# Patient Record
Sex: Male | Born: 1945
Health system: Southern US, Community
[De-identification: ages and names within clinical notes are randomized; demographics above are authoritative.]

## PROBLEM LIST (undated history)

## (undated) DIAGNOSIS — I10 Essential (primary) hypertension: Secondary | ICD-10-CM

## (undated) DIAGNOSIS — I493 Ventricular premature depolarization: Secondary | ICD-10-CM

## (undated) DIAGNOSIS — K439 Ventral hernia without obstruction or gangrene: Secondary | ICD-10-CM

## (undated) DIAGNOSIS — I442 Atrioventricular block, complete: Secondary | ICD-10-CM

## (undated) DIAGNOSIS — C109 Malignant neoplasm of oropharynx, unspecified: Secondary | ICD-10-CM

## (undated) DIAGNOSIS — E278 Other specified disorders of adrenal gland: Secondary | ICD-10-CM

## (undated) DIAGNOSIS — J189 Pneumonia, unspecified organism: Secondary | ICD-10-CM

## (undated) DIAGNOSIS — I350 Nonrheumatic aortic (valve) stenosis: Secondary | ICD-10-CM

## (undated) DIAGNOSIS — K8020 Calculus of gallbladder without cholecystitis without obstruction: Secondary | ICD-10-CM

## (undated) DIAGNOSIS — K469 Unspecified abdominal hernia without obstruction or gangrene: Secondary | ICD-10-CM

## (undated) DIAGNOSIS — I272 Pulmonary hypertension, unspecified: Secondary | ICD-10-CM

## (undated) DIAGNOSIS — G4733 Obstructive sleep apnea (adult) (pediatric): Secondary | ICD-10-CM

## (undated) DIAGNOSIS — I472 Ventricular tachycardia: Secondary | ICD-10-CM

## (undated) DIAGNOSIS — I319 Disease of pericardium, unspecified: Secondary | ICD-10-CM

## (undated) DIAGNOSIS — R911 Solitary pulmonary nodule: Secondary | ICD-10-CM

## (undated) DIAGNOSIS — I219 Acute myocardial infarction, unspecified: Secondary | ICD-10-CM

## (undated) DIAGNOSIS — I712 Thoracic aortic aneurysm, without rupture: Secondary | ICD-10-CM

## (undated) DIAGNOSIS — R011 Cardiac murmur, unspecified: Secondary | ICD-10-CM

## (undated) DIAGNOSIS — Z136 Encounter for screening for cardiovascular disorders: Secondary | ICD-10-CM

## (undated) HISTORY — DX: Aneurysm of the ascending aorta, without rupture: I71.21

## (undated) HISTORY — DX: Atherosclerotic heart disease of native coronary artery without angina pectoris: I25.10

## (undated) HISTORY — DX: Nonrheumatic aortic (valve) stenosis: I35.0

## (undated) HISTORY — DX: Atrioventricular block, complete: I44.2

## (undated) HISTORY — DX: Ventricular tachycardia: I47.2

## (undated) HISTORY — DX: Obstructive sleep apnea (adult) (pediatric): G47.33

## (undated) HISTORY — DX: Ventricular premature depolarization: I49.3

## (undated) HISTORY — DX: Hyperlipidemia, unspecified: E78.5

## (undated) HISTORY — DX: Other specified disorders of adrenal gland: E27.8

## (undated) HISTORY — DX: Morbid (severe) obesity due to excess calories: E66.01

## (undated) HISTORY — DX: Thoracic aortic aneurysm, without rupture: I71.2

## (undated) HISTORY — DX: Solitary pulmonary nodule: R91.1

## (undated) HISTORY — DX: Encounter for screening for cardiovascular disorders: Z13.6

## (undated) HISTORY — DX: Other ventricular tachycardia: I47.29

## (undated) HISTORY — DX: Ventral hernia without obstruction or gangrene: K43.9

## (undated) HISTORY — DX: Presence of cardiac pacemaker: Z95.0

## (undated) HISTORY — DX: Malignant neoplasm of oropharynx, unspecified: C10.9

## (undated) HISTORY — DX: Disease of pericardium, unspecified: I31.9

## (undated) HISTORY — DX: Unspecified abdominal hernia without obstruction or gangrene: K46.9

## (undated) HISTORY — DX: Pulmonary hypertension, unspecified: I27.20

---
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
## 1999-08-01 ENCOUNTER — Encounter: Payer: Self-pay | Admitting: Emergency Medicine

## 1999-08-01 ENCOUNTER — Emergency Department (HOSPITAL_COMMUNITY): Admission: EM | Admit: 1999-08-01 | Discharge: 1999-08-02 | Payer: Self-pay | Admitting: Emergency Medicine

## 2002-05-09 HISTORY — PX: HERNIA REPAIR: SHX51

## 2002-09-07 HISTORY — PX: KNEE ARTHROSCOPY: SUR90

## 2004-08-02 ENCOUNTER — Ambulatory Visit (HOSPITAL_COMMUNITY): Admission: RE | Admit: 2004-08-02 | Discharge: 2004-08-02 | Payer: Self-pay | Admitting: Family Medicine

## 2005-05-13 ENCOUNTER — Ambulatory Visit (HOSPITAL_BASED_OUTPATIENT_CLINIC_OR_DEPARTMENT_OTHER): Admission: RE | Admit: 2005-05-13 | Discharge: 2005-05-13 | Payer: Self-pay | Admitting: Family Medicine

## 2005-05-22 ENCOUNTER — Ambulatory Visit: Payer: Self-pay | Admitting: Internal Medicine

## 2008-05-09 DIAGNOSIS — Z136 Encounter for screening for cardiovascular disorders: Secondary | ICD-10-CM

## 2008-05-09 DIAGNOSIS — E278 Other specified disorders of adrenal gland: Secondary | ICD-10-CM

## 2008-05-09 HISTORY — DX: Encounter for screening for cardiovascular disorders: Z13.6

## 2008-05-09 HISTORY — DX: Other specified disorders of adrenal gland: E27.8

## 2009-01-28 ENCOUNTER — Encounter: Admission: RE | Admit: 2009-01-28 | Discharge: 2009-01-28 | Payer: Self-pay | Admitting: Internal Medicine

## 2009-03-06 ENCOUNTER — Encounter: Admission: RE | Admit: 2009-03-06 | Discharge: 2009-03-06 | Payer: Self-pay | Admitting: General Surgery

## 2009-03-09 HISTORY — PX: VENTRAL HERNIA REPAIR: SHX424

## 2010-06-04 ENCOUNTER — Inpatient Hospital Stay (HOSPITAL_COMMUNITY)
Admission: EM | Admit: 2010-06-04 | Discharge: 2010-06-06 | Payer: Self-pay | Source: Home / Self Care | Attending: Internal Medicine | Admitting: Internal Medicine

## 2010-06-04 LAB — POCT I-STAT, CHEM 8
BUN: 22 mg/dL (ref 6–23)
Calcium, Ion: 1.11 mmol/L — ABNORMAL LOW (ref 1.12–1.32)
Chloride: 103 mEq/L (ref 96–112)
Creatinine, Ser: 1 mg/dL (ref 0.4–1.5)
Glucose, Bld: 119 mg/dL — ABNORMAL HIGH (ref 70–99)
HCT: 47 % (ref 39.0–52.0)
Hemoglobin: 16 g/dL (ref 13.0–17.0)
Potassium: 4.3 mEq/L (ref 3.5–5.1)
Sodium: 137 mEq/L (ref 135–145)
TCO2: 28 mmol/L (ref 0–100)

## 2010-06-04 LAB — CBC
Hemoglobin: 15.1 g/dL (ref 13.0–17.0)
MCHC: 34.6 g/dL (ref 30.0–36.0)
Platelets: 234 10*3/uL (ref 150–400)
RDW: 12.5 % (ref 11.5–15.5)

## 2010-06-04 LAB — DIFFERENTIAL
Basophils Absolute: 0 10*3/uL (ref 0.0–0.1)
Basophils Relative: 0 % (ref 0–1)
Eosinophils Absolute: 0.1 10*3/uL (ref 0.0–0.7)
Monocytes Absolute: 1.6 10*3/uL — ABNORMAL HIGH (ref 0.1–1.0)
Neutro Abs: 12.5 10*3/uL — ABNORMAL HIGH (ref 1.7–7.7)

## 2010-06-04 LAB — D-DIMER, QUANTITATIVE: D-Dimer, Quant: 2.36 ug/mL-FEU — ABNORMAL HIGH (ref 0.00–0.48)

## 2010-06-04 LAB — POCT CARDIAC MARKERS
CKMB, poc: 2.9 ng/mL (ref 1.0–8.0)
Myoglobin, poc: 119 ng/mL (ref 12–200)
Troponin i, poc: <0.05 ng/mL (ref 0.00–0.09)

## 2010-06-05 LAB — CK TOTAL AND CKMB (NOT AT ARMC)
CK, MB: 2.4 ng/mL (ref 0.3–4.0)
CK, MB: 1.9 ng/mL (ref 0.3–4.0)
CK, MB: 2 ng/mL (ref 0.3–4.0)
Relative Index: INVALID (ref 0.0–2.5)
Relative Index: INVALID (ref 0.0–2.5)
Relative Index: 1.9 (ref 0.0–2.5)
Total CK: 99 U/L (ref 7–232)
Total CK: 89 U/L (ref 7–232)
Total CK: 108 U/L (ref 7–232)

## 2010-06-05 LAB — HEMOGLOBIN A1C
Hgb A1c MFr Bld: 5.7 % — ABNORMAL HIGH (ref <5.7)
Mean Plasma Glucose: 117 mg/dL — ABNORMAL HIGH (ref <117)

## 2010-06-05 LAB — TROPONIN I
Troponin I: 0.02 ng/mL (ref 0.00–0.06)
Troponin I: 0.01 ng/mL (ref 0.00–0.06)

## 2010-06-05 LAB — COMPREHENSIVE METABOLIC PANEL
ALT: 28 U/L (ref 0–53)
AST: 20 U/L (ref 0–37)
Alkaline Phosphatase: 50 U/L (ref 39–117)
CO2: 28 mEq/L (ref 19–32)
Calcium: 8.7 mg/dL (ref 8.4–10.5)
GFR calc Af Amer: >60 mL/min (ref >60)
Glucose, Bld: 114 mg/dL — ABNORMAL HIGH (ref 70–99)
Potassium: 4.1 mEq/L (ref 3.5–5.1)
Sodium: 139 mEq/L (ref 135–145)
Total Protein: 6.5 g/dL (ref 6.0–8.3)

## 2010-06-05 LAB — PROTIME-INR
INR: 1.23 (ref 0.00–1.49)
Prothrombin Time: 15.7 seconds — ABNORMAL HIGH (ref 11.6–15.2)

## 2010-06-05 LAB — LIPID PANEL
Cholesterol: 138 mg/dL (ref 0–200)
LDL Cholesterol: 85 mg/dL (ref 0–99)
Triglycerides: 51 mg/dL (ref <150)
VLDL: 10 mg/dL (ref 0–40)

## 2010-06-05 LAB — DIFFERENTIAL
Basophils Absolute: 0 10*3/uL (ref 0.0–0.1)
Basophils Relative: 0 % (ref 0–1)
Eosinophils Relative: 0 % (ref 0–5)
Monocytes Absolute: 2.1 10*3/uL — ABNORMAL HIGH (ref 0.1–1.0)
Monocytes Relative: 13 % — ABNORMAL HIGH (ref 3–12)

## 2010-06-05 LAB — CBC
MCH: 31.4 pg (ref 26.0–34.0)
MCHC: 33.8 g/dL (ref 30.0–36.0)
RDW: 12.7 % (ref 11.5–15.5)

## 2010-06-05 LAB — PHOSPHORUS: Phosphorus: 3 mg/dL (ref 2.3–4.6)

## 2010-06-05 LAB — RHEUMATOID FACTOR: Rheumatoid fact SerPl-aCnc: 21 IU/mL — ABNORMAL HIGH (ref <=14)

## 2010-06-05 LAB — TSH: TSH: 0.966 u[IU]/mL (ref 0.350–4.500)

## 2010-06-05 LAB — SEDIMENTATION RATE: Sed Rate: 50 mm/hr — ABNORMAL HIGH (ref 0–16)

## 2010-06-05 LAB — MAGNESIUM: Magnesium: 2.2 mg/dL (ref 1.5–2.5)

## 2010-06-05 NOTE — H&P (Signed)
NAME:  Clarence Andrade, Clarence Andrade NO.:  [id number]  MEDICAL RECORD NO.:  [id number]          PATIENT TYPE:  EMS  LOCATION:  ED                           FACILITY:  [facility]  PHYSICIAN:  Michiel Cowboy, MDDATE OF BIRTH:  02-07-1946  DATE OF ADMISSION:  06/04/2010 DATE OF DISCHARGE:                             HISTORY & PHYSICAL   ATTENDING PHYSICIAN:  Michiel Cowboy, MD  PRIMARY CARE PROVIDER:  Theressa Millard, M.D.  CHIEF COMPLAINT:  Chest pain.  HISTORY:  The patient is a 65 year old gentleman with past medical history significant for 10 years ago he had pericarditis and osteoarthritis.  Otherwise, he is fairly healthy.  The patient 2 weeks ago started to have chest pain which was positional in nature and fairly significant.  He presented to Urgent Care and had EKG done with diagnosis of pericarditis.  He was asked to take ibuprofen around the clock which had showed some improvement.  He stopped ibuprofen therapy of about a week after the beginning of his symptoms because he started to feel much better.  For few days, he was asymptomatic and today he started to have the severe chest pain, which was again positional.  It was somewhat worse though and more diffuse and also somewhat pleuritic in nature, at which point he presented to the emergency department.  In the ED, he had a CT scan of the chest done to rule out PE, which showed moderate pericardial effusion, which point, cardiology was called. Dr. Donnie Aho wanted him to be admitted by medicine for observation and echogram in the morning, at which point [facility] hospitalist was called. The patient was supposed to have done an echo as an outpatient, but this was not arranged.  REVIEW OF SYSTEMS:  Significant for chest pain.  He has minimal shortness of breath when he tries to take deep breath because of pain, cough bothers him.  He has not had any recent URI type of symptoms, though no colds or upper respiratory  infections that he can mention.  No nausea, no vomiting, no diaphoresis.  The chest pain was pretty much continuous until he came into the emergency department and was relieved with Toradol and Dilaudid.  Otherwise review of systems is negative.  PAST MEDICAL HISTORY:  Significant for arthritis and history of pericarditis 10 years ago.  SOCIAL HISTORY:  The patient never smoked, does not drink or abuse drugs.  FAMILY HISTORY:  Significant for father with coronary artery disease at his 33s.  Mother with COPD.  Sister is healthy.  ALLERGIES:  None.  MEDICATIONS: 1. Aspirin 81 mg daily. 2. Ibuprofen 800 mg 2 times a day. 3. Aleve 1 tablet b.i.d.  PHYSICAL EXAMINATION:  VITAL SIGNS:  Temperature 98.5, blood pressure initially 128/83 and now 119/75, pulse 108, respirations 22, satting 93% on room air. GENERAL:  The patient appears to be in no acute distress, currently he is chest pain-free. HEAD:  Nontraumatic.  Moist mucous membranes. LUNGS:  Clear to auscultation except for very minimal crackles at the bases.HEART:  Regular rate and rhythm but somewhat distant heart sounds.  No rub appreciated. ABDOMEN:  Soft,  nontender, nondistended.  Somewhat obese. LOWER EXTREMITIES:  No clubbing, cyanosis, or edema. NEUROLOGICALLY:  Grossly intact. SKIN:  Clean, dry, and intact.  No rashes or breakdown present.  LABORATORY DATA:  White blood cell count 15.6, hemoglobin 16.  Sodium 137, potassium 4.3, creatinine 1.0.  BNP 62.9, D-dimer 2.36.  Cardiac enzymes unremarkable.  CT scan showing a moderate-size pericardial effusion and left pleural effusion.  ASSESSMENT AND PLAN:  This is a 65 year old gentleman with likely is pericarditis with some pericardial effusion noted on the CT scan, but he had not had an echogram yet to measure this more accurately.  We will admit.  Have discussed him with cardiology on call who will attempt to see him at night or the patient will be seen early in the  morning.  We will also facilitate obtaining an echogram done on weekend.  Per their recommendations, will start him on ibuprofen 600 mg t.i.d., give gentle fluids to help with preload.  Will cycle cardiac enzymes for prognostication reasons.  I also believe that overall, he may benefit from a further cardiac evaluation in the future. Also the cause of recurrent pericarditis should be evaluated.  I will order ANA and rheumatoid factor for now but probably further evaluation should be done.  Prophylaxis.  Make sure he is on Protonix.  Avoid heparin.  Will put him on SCDs.     Michiel Cowboy, MD     AVD/MEDQ  D:  06/04/2010  T:  06/04/2010  Job:  [id number]  cc:   Theressa Millard, M.D. Fax: [phone]  Electronically Signed by Therisa Doyne MD on 06/05/2010 06:25:50 AM

## 2010-06-06 LAB — CBC
Hemoglobin: 13.3 g/dL (ref 13.0–17.0)
MCH: 31.6 pg (ref 26.0–34.0)
Platelets: 215 10*3/uL (ref 150–400)
RBC: 4.21 MIL/uL — ABNORMAL LOW (ref 4.22–5.81)
WBC: 10.3 10*3/uL (ref 4.0–10.5)

## 2010-06-06 LAB — BASIC METABOLIC PANEL
BUN: 12 mg/dL (ref 6–23)
Calcium: 8.5 mg/dL (ref 8.4–10.5)
Creatinine, Ser: 0.71 mg/dL (ref 0.4–1.5)
GFR calc non Af Amer: >60 mL/min (ref >60)
Glucose, Bld: 106 mg/dL — ABNORMAL HIGH (ref 70–99)

## 2010-06-07 LAB — ANA: Anti Nuclear Antibody(ANA): NEGATIVE

## 2010-06-11 NOTE — H&P (Addendum)
NAME:  Clarence Andrade, Clarence Andrade NO.:  [id number]  MEDICAL RECORD NO.:  [id number]          PATIENT TYPE:  INP  LOCATION:  1441                         FACILITY:  [facility]  PHYSICIAN:  Theressa Millard, M.D.    DATE OF BIRTH:  1946-01-03  DATE OF ADMISSION:  06/04/2010 DATE OF DISCHARGE:                             HISTORY & PHYSICAL   HISTORY OF PRESENT ILLNESS:  This 65 year old male has previously been in good health.  About 10 years ago, he developed chest discomfort and was diagnosed with pericarditis and treated with nonsteroidal anti- inflammatory agents.  He does not remember much in the way of details with that.  Two weeks ago, he developed a positional chest discomfort, which was severe and presented to Urgent Care and was diagnosed with pericarditis.  He took ibuprofen and showed improvement, and discontinued it about a week after starting his symptoms.  He was able to exercise this week and the day of admission had more severe pain, which became severe pleuritic and he was unable to sleep lying down and became more diffuse pleuritic, and presented to the emergency department.  A CT scan of the chest was done to rule out pulmonary embolus, which showed a moderate pericardial effusion.  He was admitted last night for observation.  He has been treated with Toradol and Dilaudid since then, and continues to have pleuritic pain and he is was unable to lay down.  He is not currently having dyspnea.  An echocardiogram done earlier today was somewhat technically difficult due to his obesity, but showed  predominantly anterior fusion with questionable right ventricular free wall thickening versus exudate on the right ventricular wall.  His inferior vena cava also appeared to be dilated, but there was no definite evidence of tamponade.  He does not have significant anginal type pain.  He has been obese for many years, but has otherwise benign history.  PAST MEDICAL HISTORY:   Past history is remarkable for osteoarthritis mainly of the knees and significant obesity.  His lipid status is unknown.  There is no prior history of hypertension.  PAST SURGICAL HISTORY:  He has had a hernia repair x3.  ALLERGIES:  None.  MEDICATIONS:  Ibuprofen, aspirin, and Aleve.  FAMILY HISTORY:  Father had coronary artery disease in his 6s and died age 45.  Mother died of COPD.  Sister is healthy.  SOCIAL HISTORY:  He is a Engineer, civil (consulting).  He has 1 son.  He has never smoked and does not use alcohol to excess.  REVIEW OF SYSTEMS:  He has been obese for many years.  He has no eye, nose, or throat problems.  No skin problems.  He has no significant arthralgias.  He has not had weight loss or fever.  He did not have any recent upper respiratory infections or viral-like illnesses preceding this illness.  He denies other symptoms applicable to the review of systems.  PHYSICAL EXAMINATION:  GENERAL:  On examination, he has a large pleasant male, who is currently in no acute distress. VITAL SIGNS:  Blood pressure is currently 107/70, pulse is 87, it was 108  earlier.  There appeared to be a 15 mm pulses paradoxus noted. SKIN:  Warm and dry. ENT:  EOMI.  PERRLA. CENTRAL NERVOUS SYSTEM:  Clear.  It appear that his neck veins were elevated when he was sitting upright and there appeared to be elevated JVD at 45 degrees.  I could not tell whether there was a Kussmaul sign. There are decreased breath sounds in the left base. CARDIOVASCULAR:  Somewhat distant heart sounds.  There is no S3.  I could not appreciate a rub. ABDOMEN:  Soft and nontender.  There is 1+ edema noted.  He has noted some episodic edema, particularly when he traveled previously.  IMAGING STUDIES:  His EKG shows low voltage, but pulse is alternans. There is no PR depression or changes of pericarditis on EKG.  Chest x- ray showed density at the left base.  CT scan reviewed with radiologist showed an  irregular loculated effusion predominantly anterior and posterior, not much effusion noted at the apex.  LABORATORY DATA:  White count of 16,100, 79% neutrophils, 30% monocytes, Pro-time, INR is 15.7, D-dimer is 2.36.  Hemoglobin A1c is 5.7.  Serial cardiac enzymes were normal.  RA factor is 21.  TSH is 0.966.  IMPRESSION AND PLAN:  Pericardial effusion, which appears to be few so constrictive with clinical evidence of pericarditis.  He appears to have clinically some elevation of his right heart pressures.  I would suggest the addition of colchicine to his regimen.  Because the effusion is loculated if it increases, he will need to have pericardial window or biopsy done to drain the fluid and to determine what the etiology for the fluid is.  He does have a remote history of pericarditis and I would like to try to see old records from this.  At the present time, he appears to have some elevation of his right heart pressures.  This will need to be watched closely, but he does not appear to be in tamponade right now.  It is possible this has been a more subacute process that has been developing.  He continues to have an elevated white count and we will need to be sure he does not have bacterial pericarditis; although, he does not clinically have evidence for that.  Thank you for asking me to see him with you.     Georga Hacking, M.D.   ______________________________ Theressa Millard, M.D.    WST/MEDQ  D:  06/05/2010  T:  06/05/2010  Job:  [id number]  cc:   Theressa Millard, M.D.  Electronically Signed by Theressa Millard M.D. on 06/11/2010 09:41:46 PM Electronically Signed by Lacretia Nicks. Donnie Aho M.D. on 06/16/2010 09:25:43 AM

## 2010-06-30 NOTE — Discharge Summary (Addendum)
NAME:  Clarence Andrade, PATIL NO.:  [id number]  MEDICAL RECORD NO.:  [id number]          PATIENT TYPE:  INP  LOCATION:  1441                         FACILITY:  [facility]  PHYSICIAN:  Elby Showers, MD    DATE OF BIRTH:  1945/12/29  DATE OF ADMISSION:  06/04/2010 DATE OF DISCHARGE:  06/06/2010                              DISCHARGE SUMMARY   DISCHARGE DIAGNOSIS:  Pericarditis.  DISCHARGE MEDICATIONS: 1. Colchicine 0.6 mg 1 tablet b.i.d. 2. Ibuprofen 600 mg 1 tablet 3 times a day. 3. Aspirin 81 mg daily.  DISPOSITION:  The patient is discharged in stable condition.  He has some residual chest discomfort.  He will be following up with his primary care physician, Dr. Theressa Millard, the day after admission on June 07 2010.  He will follow up with Cardiology within 1 week.  CONSULTATIONS:  Cardiology, the patient was seen by Dr. Donnie Aho.  PROCEDURES: 1. Chest x-ray on June 04, 2010, shows vague opacity at the left     lung base, possible left effusion and probably left basilar     pneumonia. 2. CT angio of the chest on June 04, 2010, shows moderate-sized     pericardial effusion, no evidence of pulmonary emboli, coronary     artery calcifications are present, there is a left pleural effusion     and left basilar atelectasis. 3. A 2-D echo on June 05, 2010, read by Dr. Donnie Aho shows left     ventricle to be of normal size, moderate concentric hypertrophy,     systolic function normal, ejection fraction 50% to 60%, wall motion     is normal.  Left atrium is mildly dilated.  Right ventricle is of     normal size with mild increase in thickness.  There is a moderate     to large pericardial effusion which is circumferential.  Fluid     exhibits a fibrinous appearance consistent with an     effusive/constrictive pathology. 4. A 2-D echo on June 06, 2010, read by Dr. Donnie Aho, left ventricle     moderate concentric hypertrophy, systolic function is normal,  pericardial effusion persists, it is partially loculated.  There is     no evidence of hemodynamic compromise.  There is a left pleural     effusion.  HISTORY AND PHYSICAL:  This is a 65 year old gentleman with past medical history of pericarditis 11 years prior to presentation presented on June 04, 2010, with chest pain for 2 weeks.  Initially, chest pain was mild to moderate, becoming acutely severe before presentation. Chest pain was positional in nature, relieved by leaning forward.  He had been taking ibuprofen around the clock and had some improvement of symptoms.  Prior to presentation, he developed pleuritic pain, pain with deep breathing and decided to present to the emergency department at that time.  HOSPITAL COURSE:  Pericarditis.  On presentation to the emergency room, a CT angio of the chest was done which revealed a pericardial effusion. Cardiology was consulted, 2-D echo was scheduled.  Mr. Vivas received Toradol and Dilaudid in the emergency room which completely resolved his symptoms.  He did not have recurrence of symptoms during the hospitalization.  He described a shortness of breath, and chest tightness/heaviness.  He was maintained on ibuprofen while inpatient. He will be discharged on ibuprofen and colchicine.  Two echocardiograms were performed, both demonstrating the pericardial effusion.  There was no compromise of ventricular function.  He will be discharged with immediate follow up with his primary care provider, and he will follow up with Cardiology within the next week.  Pertinent labs including cardiac enzymes were negative x3.  Erythrocyte sedimentation rate was 50.  ANA is negative.  Rheumatoid factor is 21, which is slightly elevated.  TSH is normal.  Hemoglobin A1c is 5.7. Lipid profile shows total cholesterol of 138, LDL of 85, HDL of 43.  DISCHARGE LABORATORY DATA AND VITALS:  On day of discharge, temperature 97.8, pulse 78, blood pressure  115/75, respirations 18, oxygen saturation 96%.  Sodium 141, potassium 4.3, chloride 104, bicarb 30, BUN 12, creatinine 0.7, glucose of 106, calcium 85.  White blood cells 10.3, hemoglobin 13.3, hematocrit 39.4, platelets 215,000.     Elby Showers, MD     CW/MEDQ  D:  06/09/2010  T:  06/09/2010  Job:  [id number]  Electronically Signed by Elby Showers MD on 06/30/2010 09:58:59 AM

## 2010-09-07 ENCOUNTER — Other Ambulatory Visit: Payer: Self-pay | Admitting: Orthopedic Surgery

## 2010-09-07 ENCOUNTER — Encounter (HOSPITAL_COMMUNITY): Payer: Medicare Other | Attending: Orthopedic Surgery

## 2010-09-07 DIAGNOSIS — Z01811 Encounter for preprocedural respiratory examination: Secondary | ICD-10-CM | POA: Insufficient documentation

## 2010-09-07 DIAGNOSIS — M1710 Unilateral primary osteoarthritis, unspecified knee: Secondary | ICD-10-CM | POA: Insufficient documentation

## 2010-09-07 DIAGNOSIS — Z01812 Encounter for preprocedural laboratory examination: Secondary | ICD-10-CM | POA: Insufficient documentation

## 2010-09-07 DIAGNOSIS — Z79899 Other long term (current) drug therapy: Secondary | ICD-10-CM | POA: Insufficient documentation

## 2010-09-07 HISTORY — PX: TOTAL KNEE ARTHROPLASTY: SHX125

## 2010-09-07 LAB — PROTIME-INR
INR: 1.01 (ref 0.00–1.49)
Prothrombin Time: 13.5 seconds (ref 11.6–15.2)

## 2010-09-07 LAB — URINALYSIS, ROUTINE W REFLEX MICROSCOPIC
Glucose, UA: NEGATIVE mg/dL
Ketones, ur: 15 mg/dL — AB
Nitrite: NEGATIVE
Protein, ur: NEGATIVE mg/dL
pH: 5.5 (ref 5.0–8.0)

## 2010-09-07 LAB — CBC
MCH: 30.8 pg (ref 26.0–34.0)
MCHC: 34 g/dL (ref 30.0–36.0)
RDW: 13 % (ref 11.5–15.5)

## 2010-09-07 LAB — URINE MICROSCOPIC-ADD ON

## 2010-09-07 LAB — COMPREHENSIVE METABOLIC PANEL
ALT: 33 U/L (ref 0–53)
Alkaline Phosphatase: 73 U/L (ref 39–117)
BUN: 25 mg/dL — ABNORMAL HIGH (ref 6–23)
CO2: 29 mEq/L (ref 19–32)
Calcium: 9.6 mg/dL (ref 8.4–10.5)
GFR calc non Af Amer: >60 mL/min (ref >60)
Glucose, Bld: 93 mg/dL (ref 70–99)
Sodium: 143 mEq/L (ref 135–145)
Total Protein: 6.9 g/dL (ref 6.0–8.3)

## 2010-09-15 ENCOUNTER — Inpatient Hospital Stay (HOSPITAL_COMMUNITY)
Admission: RE | Admit: 2010-09-15 | Discharge: 2010-09-20 | DRG: 462 | Disposition: A | Discharge status: Skilled Nursing Facility | Payer: Medicare Other | Source: Ambulatory Visit | Attending: Orthopedic Surgery | Admitting: Orthopedic Surgery

## 2010-09-15 DIAGNOSIS — Z01812 Encounter for preprocedural laboratory examination: Secondary | ICD-10-CM

## 2010-09-15 DIAGNOSIS — E871 Hypo-osmolality and hyponatremia: Secondary | ICD-10-CM | POA: Diagnosis not present

## 2010-09-15 DIAGNOSIS — I9589 Other hypotension: Secondary | ICD-10-CM | POA: Diagnosis not present

## 2010-09-15 DIAGNOSIS — R112 Nausea with vomiting, unspecified: Secondary | ICD-10-CM | POA: Diagnosis not present

## 2010-09-15 DIAGNOSIS — T40605A Adverse effect of unspecified narcotics, initial encounter: Secondary | ICD-10-CM | POA: Diagnosis not present

## 2010-09-15 DIAGNOSIS — D62 Acute posthemorrhagic anemia: Secondary | ICD-10-CM | POA: Diagnosis not present

## 2010-09-15 DIAGNOSIS — M1710 Unilateral primary osteoarthritis, unspecified knee: Principal | ICD-10-CM | POA: Diagnosis present

## 2010-09-15 LAB — ABO/RH: ABO/RH(D): O POS

## 2010-09-15 LAB — TYPE AND SCREEN
ABO/RH(D): O POS
Antibody Screen: NEGATIVE

## 2010-09-16 LAB — BASIC METABOLIC PANEL
CO2: 30 mEq/L (ref 19–32)
Calcium: 8 mg/dL — ABNORMAL LOW (ref 8.4–10.5)
Creatinine, Ser: 0.79 mg/dL (ref 0.4–1.5)
GFR calc Af Amer: >60 mL/min (ref >60)
GFR calc non Af Amer: >60 mL/min (ref >60)
Sodium: 137 mEq/L (ref 135–145)

## 2010-09-16 LAB — PROTIME-INR
INR: 1.18 (ref 0.00–1.49)
Prothrombin Time: 15.2 seconds (ref 11.6–15.2)

## 2010-09-16 LAB — CBC
Platelets: 136 10*3/uL — ABNORMAL LOW (ref 150–400)
RBC: 3.82 MIL/uL — ABNORMAL LOW (ref 4.22–5.81)
RDW: 13.1 % (ref 11.5–15.5)
WBC: 9.8 10*3/uL (ref 4.0–10.5)

## 2010-09-17 LAB — CBC
MCH: 31.4 pg (ref 26.0–34.0)
Platelets: 116 10*3/uL — ABNORMAL LOW (ref 150–400)
RBC: 3.53 MIL/uL — ABNORMAL LOW (ref 4.22–5.81)
WBC: 12.4 10*3/uL — ABNORMAL HIGH (ref 4.0–10.5)

## 2010-09-17 LAB — PROTIME-INR
INR: 1.43 (ref 0.00–1.49)
Prothrombin Time: 17.6 seconds — ABNORMAL HIGH (ref 11.6–15.2)

## 2010-09-17 LAB — BASIC METABOLIC PANEL
Calcium: 8.4 mg/dL (ref 8.4–10.5)
GFR calc non Af Amer: >60 mL/min (ref >60)
Glucose, Bld: 129 mg/dL — ABNORMAL HIGH (ref 70–99)
Sodium: 133 mEq/L — ABNORMAL LOW (ref 135–145)

## 2010-09-18 LAB — BASIC METABOLIC PANEL
CO2: 30 mEq/L (ref 19–32)
Calcium: 8.3 mg/dL — ABNORMAL LOW (ref 8.4–10.5)
GFR calc Af Amer: >60 mL/min (ref >60)
GFR calc non Af Amer: >60 mL/min (ref >60)
Sodium: 133 mEq/L — ABNORMAL LOW (ref 135–145)

## 2010-09-18 LAB — CBC
Hemoglobin: 10.4 g/dL — ABNORMAL LOW (ref 13.0–17.0)
MCHC: 33.5 g/dL (ref 30.0–36.0)
RDW: 13.1 % (ref 11.5–15.5)
WBC: 11.8 10*3/uL — ABNORMAL HIGH (ref 4.0–10.5)

## 2010-09-18 LAB — PROTIME-INR: INR: 1.67 — ABNORMAL HIGH (ref 0.00–1.49)

## 2010-09-19 LAB — CBC
Platelets: 154 10*3/uL (ref 150–400)
RDW: 13.3 % (ref 11.5–15.5)
WBC: 9.8 10*3/uL (ref 4.0–10.5)

## 2010-09-19 LAB — PROTIME-INR: INR: 1.89 — ABNORMAL HIGH (ref 0.00–1.49)

## 2010-09-20 LAB — PROTIME-INR: Prothrombin Time: 24.7 seconds — ABNORMAL HIGH (ref 11.6–15.2)

## 2010-09-22 NOTE — Op Note (Signed)
NAME:  Clarence Andrade, REWERTS               ACCOUNT NO.:  [id number]  MEDICAL RECORD NO.:  [id number]           PATIENT TYPE:  I  LOCATION:  1610                         FACILITY:  [facility]  PHYSICIAN:  Ollen Gross, M.D.    DATE OF BIRTH:  1945/10/14  DATE OF PROCEDURE:  09/15/2010 DATE OF DISCHARGE:                              OPERATIVE REPORT   PREOPERATIVE DIAGNOSIS:  Osteoarthritis, bilateral knees.  POSTOPERATIVE DIAGNOSIS:  Osteoarthritis, bilateral knees.  PROCEDURE:  Bilateral total knee arthroplasty.  SURGEON:  Ollen Gross, M.D.  ASSISTANT:  Alexzandrew L. Perkins, P.A.C.  ANESTHESIA:  Epidural with general.  ESTIMATED BLOOD LOSS:  Minimal.  DRAIN:  Autovac x1 on each side.  TOURNIQUET TIME:  Right, 38 minutes at 300 mmHg; left, 48 minutes at 300 mmHg.  COMPLICATIONS:  None.  CONDITION.:  Stable to recovery room.  BRIEF CLINICAL NOTE:  Clarence Andrade is a 65 year old male who has advanced end-stage arthritis of both knees with progressively worsening pain and dysfunction in each knee.  Pain is essentially equal between the 2 sides.  He has failed nonoperative management.  We discussed doing bilateral total knees in the same setting versus doing one at a time. He said that he was very much in favor of doing both at the same time. We discussed procedure risks, potential complications associated with both and he elects today to proceed with bilateral total knee arthroplasty.  PROCEDURE IN DETAIL:  After administration of epidural anesthetic catheter and then successful administration of general anesthetic, tourniquets were then placed high on the patient's thighs and both lower extremities were prepped and draped in the usual sterile fashion.  We did the right side first.  The right lower extremity was wrapped in Esmarch, knee flexed, tourniquet inflated to 300 mmHg.  Midline incision was made with #10 blade through subcutaneous tissue to the level of the extensor  mechanism.  A fresh blade was used make a medial parapatellar arthrotomy.  Soft tissue on the proximal medial tibia subperiosteally elevated to the joint line with the knife and into the semimembranosus bursa with a Cobb elevator.  Soft tissue laterally was elevated with attention being paid to avoiding the patellar tendon on the tibial tubercle.  Patella was everted, knee flexed 90 degrees, and ACL and PCL were removed.  Drill was used create a starting hole in the distal femur and the canal was thoroughly irrigated.  A 5-degree right valgus alignment guide was placed.  Distal femoral cutting block was pinned to remove 11 mm off the distal femur.  Resection was made with an oscillating saw.  Sizing guide was placed, size 5 was the most appropriate.  Rotation was marked at the epicondylar axis.  Size 5 cutting block was placed and anterior-posterior chamfer cuts made.  The tibia subluxed forward and the menisci removed.  Extramedullary tibial alignment guide was placed referencing proximally at the medial aspect of the tibial tubercle and distally along the second metatarsal axis and tibial crest.  The block was pinned to remove 2 mm off the more deficient medial side.  Tibial resection was made with an oscillating saw.  Size  4 was the most appropriate tibial component.  The proximal tibia was prepared with a modular drill and keel punch for the size 4. Femoral preparation completed with the intercondylar cut.  Size 4 mobile bearing tibial trial, size 5 posterior stabilized femoral trial, and a 10-mm posterior stabilized rotating platform insert trial placed.  There was little play with the 10, so went for 12.5 which allowed for full extension with excellent varus-valgus and anterior- posterior balance throughout full range of motion.  The patella was everted and thickness measured to be 23 mm.  Freehand resection was taken to 13 mm, 38 template was placed, lug holes were drilled,  trial patella was placed and it tracked normally.  Osteophytes were removed off the posterior femur with the trials in place.  All trials were removed and the cut bone surfaces were prepared with pulsatile lavage. Cement was mixed and once ready for implantation, the size 4 mobile bearing tibial tray, size five 5 posterior stabilized femur and 38 patella were cemented in place and the patella was held with a clamp. Trial 12.5-mm insert was placed, knee held in full extension, and all extruded cement removed.  When the cement fully hardened, then the permanent 12.5-mm posterior stabilized rotating platform insert was placed in the tibial tray.  Wound was copiously irrigated with saline solution and then the arthrotomy closed over Hemovac drain with interrupted #1 PDS.  Flexion against gravity to 135 degrees and patella tracks normally.  Tourniquet was then released for total time of 38 minutes.  Subcu was closed with interrupted 2-0 Vicryl and subcuticular running 4-0 Monocryl.  The left side was then addressed.  Left lower extremity was wrapped in Esmarch, knee flexed, tourniquet inflated to 300 mmHg.  Midline incision was made with a #10 blade through subcutaneous tissue to the level the extensor mechanism.  Fresh blade was used make a medial parapatellar arthrotomy.  Soft tissue releases were performed and then the ACL and PCL were removed.  Drill was used to create a starting hole in the distal femur and the canal was thoroughly irrigated.  The 5-degree left valgus alignment guide was placed and the distal femoral cutting block was pinned to remove 11 mm off the distal femur.  Distal femoral resection was made with an oscillating saw.  Sizing guide was placed, size 5 was the most appropriate.  Rotation was marked off the epicondylar axis.  Size 5 cutting block was placed and the anterior- posterior chamfer cuts made.  The tibia subluxed forward and menisci removed.  The  extramedullary tibial alignment guide was placed referencing proximally at the medial aspect of tibial tubercle and distally along the second metatarsal axis and tibial crest.  The block was pinned to remove 2 mm off the more deficient medial side.  Deficiency was a little worse on this side than the other side, we took little more  bone.  Tibial resection was made with an oscillating saw.  Size 4 was the most appropriate tibial component and the proximal tibia was prepared the modular drill and keel punch for the size 4.  Femoral preparation was completed with the intercondylar cut for the size 5.  Trial size 4 mobile bearing tibial tray, size 5 posterior stabilized femur, and 12.5 mm posterior stabilized rotating platform insert trials was placed.  There was little play with that, so went to 15 which allowed for full extension with excellent varus-valgus and anterior- posterior balance throughout full range of motion.  Patella was everted and  thickness measured to be 23 mm.  Freehand resection taken to 13 mm, 38 template was placed, lug holes were drilled, trial patella was placed and it tracks normally.  Osteophytes were removed off the posterior femur with the trial in place.  All trials were removed and the cut bone surfaces prepared with pulsatile lavage.  Cement was mixed and once ready for implantation, size 4 mobile bearing tibial tray, size 5 posterior stabilized femur and 38 patella were cemented in place and the patella was held with a clamp.  Trial 15-mm insert was placed, knee held in full extension, all extruded cement removed.  When the cement fully hardened, then the permanent 15-mm posterior stabilized rotating platform insert was placed in the tibial tray.  Wound was copiously irrigated with saline solution and then the arthrotomy closed over Autovac drain with interrupted #1 PDS.  Flexion against gravity was to 135 degrees.  Patella tracks normally.  Tourniquet was  released, second tourniquet time was 48 minutes.  Subcu was closed with interrupted 2-0 Vicryl, subcuticular running 4-0 Monocryl.  Incision was then cleaned and dried and Steri-Strips and bulky sterile dressing were applied. Left lower extremity was then placed into a knee immobilizer and he was awakened and transported to recovery in stable condition.     Ollen Gross, M.D.     FA/MEDQ  D:  09/15/2010  T:  09/15/2010  Job:  [id number]  Electronically Signed by Ollen Gross M.D. on 09/22/2010 10:13:21 AM

## 2010-09-24 NOTE — Procedures (Signed)
NAME:  Clarence Andrade, Clarence Andrade               ACCOUNT NO.:  [id number]   MEDICAL RECORD NO.:  [id number]          PATIENT TYPE:  OUT   LOCATION:  SLEEP CENTER                 FACILITY:  [facility]   PHYSICIAN:  Clinton D. Maple Hudson, M.D. DATE OF BIRTH:  [birth year]   DATE OF STUDY:  05/13/2005                              NOCTURNAL POLYSOMNOGRAM   REFERRING PHYSICIAN:  Dr. Tally Joe.   DATE OF STUDY:  May 13, 2005.   INDICATION FOR STUDY:  Hypersomnia with sleep apnea.   EPWORTH SLEEPINESS SCORE:  8/24.   BMI:  38.4.   WEIGHT:  300 pounds.   SLEEP ARCHITECTURE:  Short total sleep time 266 minutes with sleep  efficiency 62%. Stage I was 14%, stage II 83%, stages III and IV 1%, REM 2%  of total sleep time. Sleep latency 72 minutes, REM latency 197 minutes,  awake after sleep onset 95 minutes, arousal index increased at 55.6. No  bedtime medication taken.   RESPIRATORY DATA:  Split study protocol:  Apnea/hypopnea index (AHI, RDI)  66.5 obstructive events per hour indicating severe obstructive sleep  apnea/hypopnea syndrome. This included 98 obstructive apneas and 46  hypopneas before C-PAP. Events were significantly present in all sleep  positions but somewhat more common while supine. REM AHI was absent. C-PAP  was titrated to 12 CWP, AHI 2.2 per hour. A large ResMed full-face ultra  mirage mask was used with heated humidifier. He was very intolerant to the C-  PAP saying that it was uncomfortable and that he could not wear it at home.   OXYGEN DATA:  Very loud snoring audible through the closed door with oxygen  desaturation to a nadir of 50%. After C-PAP control, saturation held 91-96%  on room air.   CARDIAC DATA:  Sinus rhythm with occasional PVC.   MOVEMENT/PARASOMNIA:  A total of 131 limb jerks were reported of which 23  were associated with arousal or awakening for periodic limb movement with  arousal index of 5.2 per hour which is increased.   IMPRESSION/RECOMMENDATIONS:  1.   Severe obstructive sleep apnea/hypopnea syndrome, AHI 66.5 per hour.      Very loud snoring and oxygen desaturation to 50%. Events were somewhat      more common supine. The patient reports that he cannot usually sleep      supine due to back injury.  2.  Successful C-PAP titration to 12 CWP, AHI 2.2 per hour with      normalization of oxygen and prevention of snoring. A large ResMed full-      face ultra mirage mask was used with heated humidifier, thought patient      commented that it was poorly tolerated and interfered with sleep. He may      benefit from desensitization training which can be arranged through the      sleep center manager, Daphine Deutscher by contacting [phone]. A      supplemental sleep medication may help. Adequate time to explore      alternative masks styles may also help. Otherwise, he will need to      consider alternative therapies including surgery and oral appliances  while working on weight loss.  3.  Periodic limb movement with arousal, 5.2 per hour. This may be increased      relative to his usual pattern at home due to the stimulation of      monitoring and C-PAP.      Clinton D. Maple Hudson, M.D.  Diplomate, Biomedical engineer of Sleep Medicine  Electronically Signed     CDY/MEDQ  D:  05/22/2005 11:25:38  T:  05/22/2005 22:37:30  Job:  [id number]

## 2010-09-29 NOTE — Discharge Summary (Signed)
NAME:  Clarence Andrade, Clarence Andrade               ACCOUNT NO.:  [id number]  MEDICAL RECORD NO.:  [id number]           PATIENT TYPE:  I  LOCATION:  1610                         FACILITY:  [facility]  PHYSICIAN:  Ollen Gross, M.D.    DATE OF BIRTH:  12-14-1945  DATE OF ADMISSION:  09/15/2010 DATE OF DISCHARGE:  09/20/2010                        DISCHARGE SUMMARY - REFERRING   ADMITTING DIAGNOSES: 1. Osteoarthritis bilateral knees. 2. Past episodes of pericarditis times two. 3. Childhood illnesses of mumps.  DISCHARGE DIAGNOSES: 1. Osteoarthritis bilateral knee status post bilateral total-knee     replacement arthroplasties. 2. Mild postoperative hyponatremia. 3. Postoperative acute blood loss anemia that did not require     transfusion. 4. Past episodes of pericarditis times two. 5. Childhood illnesses of mumps.  PROCEDURE:  Sep 15, 2010, bilateral total-knee replacement arthroplasties.  SURGEON:  Ollen Gross, M.D.  ASSISTANT:  Alexzandrew L. Perkins, P.A.C.  ANESTHESIA:  General anesthetic with an epidural placement for postoperative pain control.  TOURNIQUET TIME:  Tourniquet time on the right knee was 38 minutes. Tourniquet time on the left knee was 48 minutes.  CONSULTATIONS:  Anesthesia for postoperative epidural management.  BRIEF HISTORY:  Clarence Andrade is a 65 year old male with advanced arthritis of bilateral knees, progressive worsening pain and dysfunction.  Pain is essentially equal with both sides.  He has failed nonoperative management and now presents to have bilateral total-knee replacement arthroplasties.  The risks and benefits were discussed of having one versus two done and he elected to proceed with surgery.  LABORATORY DATA:  CBC on admission:  Hemoglobin is 16.2, hematocrit of 47.7, white cell count 6.5, platelets 179.  PT/INR 13.5 and 1.01 with a PTT of 27.  Chemistry panel on admission all within normal limits. Preoperative UA showed some small leukocytes, a few  squamous, a few bacteria, 3-6 white cells, blood group type O positive.  Nasal swabs were negative for Staphylococcus aureus and negative for MRSA.  Serial CBCs were followed.  Hemoglobin dropped down to 11.7 and then to 10.4. The last noted H and H was 10.2 and 29.8.  Serial prothrombin times were followed per Coumadin protocol.  Last noted PT/INR was 24.7 and 2.21. Serial BMETs were followed for 3 days.  Sodium dropped from 143 to 133, which stabilized at 133.  Remaining electrolytes remained within normal limits.  Glucose went up from 93 to 143 and back down to 127.  RADIOLOGIC: 1. X-rays two-view chest on June 04, 2010.  A vague opacity in the     left lung base may represent left effusion and possible left     basilar pneumonia.  That was done in January. 2. He had a CT scan done also in January 2012.  Moderate-sized     pericardial effusion.  No evidence of acute pulmonary emboli.  Left     pleural effusion and left basilar atelectasis.  HOSPITAL COURSE:  The patient was admitted to [facility], taken to the operating room, and underwent the above-stated procedure without complication.  The patient tolerated the procedure well and was later transferred to the recovery room on the orthopedic floor. Anesthesia Department  was consulted postoperatively for epidural management.  He had a very decent night, although he did develop a little bit of nausea and vomiting.  We felt it was due to the PCA morphine so we discontinued that and switched him over to PCA Dilaudid. Encouraged p.o. medications.  He had decent output.  His pressure was a little soft, but felt to be due to medications and also the epidural, but he was asymptomatic with this.  Hemoglobin was 11.7.  He started transferring to a chair.  He originally wanted to go home, but over the first couple of days he was very slow and only taking one or two steps in the room and it was felt that he would benefit from  undergoing placement into a skilled facility.  The patient was in agreement so we got the social worker involved.  He wanted to look into [facility]. By day #2, the epidural was removed per Anesthesia.  His sodium was a little low, down to 133, but it actually stabilized there.  His hemoglobin remained stable at around 11, felt to be more of a dilutional component anyway.  He was positive on his fluids, so we gave him a little Lasix to help diurese.  He was started on Coumadin the night of day #1 and the epidural was removed on the morning of day #2.  Once the epidural had been out for 6 hours, he was started on Lovenox for bridging and remained on Lovenox until Monday.  He stayed in the hospital through the weekend and slowly progressing with physical therapy.  He was washed out with all of the narcotics and eventually stopped taking the p.o. pain medicine and only used Tylenol for the last day or two and felt much better.  He progressed through the weekend.  He was seen back on rounds on Monday morning on Sep 20, 2010, by Dr. Lequita Halt.  His INR was therapeutic at 2.21.  It was noted that a bed should be available today.  He was feeling okay and it was decided that he was to be transferred over to [facility] at that time.  DISCHARGE PLAN:  The patient will be transferred over to [facility].  DISCHARGE DIAGNOSES:  Please see above.  DISCHARGE MEDICATIONS:  Current medications at the time of transfer include: 1. Coumadin protocol.  Please titrate the Coumadin level for a target     INR between 2.0 and 3.0.  He needs to be on Coumadin for 4 weeks. 2. Colace 100 mg p.o. b.i.d. 3. Robaxin 500 mg p.o. q.6-8 hours p.r.n. spasm. 4. Ultracet one to two tablets every 6 hours as needed for moderate     pain. 5. Tylenol 325 one or two every 4-6 hours as needed for mild pain,     temperature or headache. 6. Laxative of choice. 7. Enema of choice. 8. Restoril 15 mg one to two tablets at  the hour of sleep p.r.n.     insomnia. 9. Benadryl 25 mg p.o. q.6 hours p.r.n. itching. 10.Artificial Tears both eyes p.r.n.  DIET:  As tolerated.  ACTIVITY:  Weightbearing as tolerated to both lower extremities following bilateral knee replacements.  Continue gait training, ambulation, ADLs, range of motion and strengthening exercises per total- knee protocol.  Please note, he may start showering once he is transferred over to [facility], however, do not submerge the incisions under water.  Daily dressing changes.  FOLLOWUP:  He needs to follow up with Dr. Lequita Halt next week  either on Tuesday, May 22 or Thursday, May 24.  Please contact the office at [phone] to help make an appointment for this patient and also help arrange transfer for followup care.  DISPOSITION:  [facility].  CONDITION ON DISCHARGE:  Improving.     Alexzandrew L. Julien Girt, P.A.C.   ______________________________ Ollen Gross, M.D.    ALP/MEDQ  D:  09/20/2010  T:  09/20/2010  Job:  [id number]  cc:   Theressa Millard, M.D. Fax: [phone]  Armanda Magic, M.D. Fax: [phone]  Electronically Signed by Patrica Duel P.A.C. on 09/23/2010 10:38:06 AM Electronically Signed by Ollen Gross M.D. on 09/29/2010 12:54:38 PM

## 2010-09-29 NOTE — H&P (Signed)
NAME:  Clarence Andrade, Clarence Andrade               ACCOUNT NO.:  [id number]  MEDICAL RECORD NO.:  [id number]           PATIENT TYPE:  I  LOCATION:  1610                         FACILITY:  [facility]  PHYSICIAN:  Ollen Gross, M.D.    DATE OF BIRTH:  06-13-1945  DATE OF ADMISSION:  09/15/2010 DATE OF DISCHARGE:                             HISTORY & PHYSICAL   CHIEF COMPLAINT:  Bilateral knee pain.  HISTORY OF PRESENT ILLNESS:  The patient is 65 year old male who has been seen by Dr. Lequita Halt for ongoing bilateral knee pain.  He has had problems with his knees for quite some time now, they are progressively getting worse, they are painful equally.  Started to interfere with his function.  He has been seen by Dr. Lequita Halt and found to have bone-on- bone arthritis in both knees especially in the medial patellofemoral compartment.  It is felt he would benefit from surgical intervention. Risks and benefits have been discussed.  He would like to proceed with surgery.  He has been seen preoperatively by Dr. Theressa Millard.  He had a past history of recent pericarditis and he has also been seen by Dr. Armanda Magic.  He has felt that he is stable for upcoming procedure by Dr. Earl Gala and Dr. Mayford Knife.  ALLERGIES:  No known drug allergies.  CURRENT MEDICATIONS:  Colchicine.  PAST MEDICAL HISTORY:  Past episodes of pericarditis x2.  Childhood illnesses of mumps.  PAST SURGICAL HISTORY:  Tonsillectomy in 1952, hernia surgery in 1978, hernia surgery again in January 2001, a third hernia surgery in November 2010, and left knee scope in 2004.  FAMILY HISTORY:  Father with heart attack.  Mother with COPD.  SOCIAL HISTORY:  Married, Environmental education officer, self employed.  Past smoker. One son.  His wife will be assisting with care after surgery.  He has 4 steps entering his home.  He does have a living will and a healthcare power of attorney.  REVIEW OF SYSTEMS:  GENERAL:  No fevers, chills or night sweats.  NEURO: No  seizures, syncope, or paralysis.  RESPIRATORY:  No shortness breath, productive cough or hemoptysis.  CARDIOVASCULAR:  No chest pain, no orthopnea.  GI:  No nausea, vomiting, diarrhea, constipation.  GU:  No dysuria, hematuria, discharge.  MUSCULOSKELETAL:  Bilateral knees.  PHYSICAL EXAMINATION:  VITAL SIGNS:  Pulse 76, respirations 12, blood pressure 130/84. GENERAL:  A 65 year old white male, well nourished, well developed, overweight, in no acute distress.  He is alert, oriented, cooperative, accompanied by his wife Clarence Andrade. HEENT:  Normocephalic, atraumatic.  Pupils round and reactive.  EOMs intact. NECK:  Supple. CHEST:  Clear anterior and posterior chest walls.  No rhonchi, rales or wheezing. HEART:  Regular rhythm.  No murmur.  S1, S2 noted. ABDOMEN:  Soft, nontender, round, protuberant abdomen.  Bowel sounds present. RECTAL:  Not done, not pertinent to present illness. BREASTS:  Not done, not pertinent to present illness. GENITALIA:  Not done, not pertinent to present illness. EXTREMITIES:  Left knee range of motion 5 to 120.  No instability.  No effusion.  Right knee shows range of motion 5 to 120.  No instability, no effusion.  IMPRESSION:  Bilateral knees osteoarthritis.  PLAN:  The patient admitted to [facility], undergo bilateral total knee replacement arthroplasty.  Surgery will be performed by Dr. Ollen Gross.     Alexzandrew L. Julien Girt, P.A.C.   ______________________________ Ollen Gross, M.D.    ALP/MEDQ  D:  09/16/2010  T:  09/16/2010  Job:  [id number]  cc:   Theressa Millard, M.D. Fax: [phone]  Armanda Magic, M.D. Fax: [phone]  Electronically Signed by Patrica Duel P.A.C. on 09/23/2010 10:37:54 AM Electronically Signed by Ollen Gross M.D. on 09/29/2010 12:54:35 PM

## 2013-06-12 ENCOUNTER — Encounter: Payer: Self-pay | Admitting: General Surgery

## 2013-06-12 DIAGNOSIS — I319 Disease of pericardium, unspecified: Secondary | ICD-10-CM | POA: Insufficient documentation

## 2013-06-25 ENCOUNTER — Ambulatory Visit: Payer: BLUE CROSS/BLUE SHIELD | Admitting: Cardiology

## 2013-07-03 ENCOUNTER — Ambulatory Visit (INDEPENDENT_AMBULATORY_CARE_PROVIDER_SITE_OTHER): Payer: Medicare Other | Admitting: Cardiology

## 2013-07-03 ENCOUNTER — Encounter: Payer: Self-pay | Admitting: Cardiology

## 2013-07-03 VITALS — BP 136/76 | HR 70 | Ht 73.0 in | Wt 284.0 lb

## 2013-07-03 DIAGNOSIS — R011 Cardiac murmur, unspecified: Secondary | ICD-10-CM | POA: Insufficient documentation

## 2013-07-03 DIAGNOSIS — I2510 Atherosclerotic heart disease of native coronary artery without angina pectoris: Secondary | ICD-10-CM

## 2013-07-03 DIAGNOSIS — I319 Disease of pericardium, unspecified: Secondary | ICD-10-CM

## 2013-07-03 NOTE — Patient Instructions (Addendum)
Your physician recommends that you continue on your current medications as directed. Please refer to the Current Medication list given to you today.  Your physician has requested that you have an exercise stress myoview. For further information please visit [email or web address]. Please follow instruction sheet, as given.  Your physician has requested that you have an echocardiogram. Echocardiography is a painless test that uses sound waves to create images of your heart. It provides your doctor with information about the size and shape of your heart and how well your heart's chambers and valves are working. This procedure takes approximately one hour. There are no restrictions for this procedure.  Your physician wants you to follow-up in: 1 Year with Dr Mallie Snooks will receive a reminder letter in the mail two months in advance. If you don't receive a letter, please call our office to schedule the follow-up appointment.

## 2013-07-03 NOTE — Progress Notes (Signed)
[facility] [address], [place], [place]  [address] Phone: [phone] Fax:  [phone]  Date:  07/03/2013   ID:  Clarence Andrade, DOB 1945-11-10, MRN [id number]  PCP:  Horton Finer, MD  Cardiologist:  Fransico Him, MD     History of Present Illness: Clarence Andrade is a 68 y.o. male with a history of Pericarditis remotely who presents today for yearly followup.  He is doing well.  He denies any chest pain, SOB, DOE, LE edema, dizziness, palpitations or syncope.  He says that last July he might have had a small bout of pericarditis.  He doubled up on his Colchicine for a few days and it resolved.     Wt Readings from Last 3 Encounters:  07/03/13 284 lb (128.822 kg)     Past Medical History  Diagnosis Date  . Pericarditis     now on colchicine  . OSA (obstructive sleep apnea)     Severe PSG 1/07 AHI 66/hr, O2 Nadir 50% Refuses treatment  . Abdominal hernia     Midline  . Adrenal incidentaloma 2010  . Screening for AAA (abdominal aortic aneurysm) 2010    CT Abd/Pelvis  . Ventral hernia     03/2009    Current Outpatient Prescriptions  Medication Sig Dispense Refill  . aspirin 81 MG tablet Take 81 mg by mouth daily.      . colchicine 0.6 MG tablet Take 0.6 mg by mouth daily.      . Multiple Vitamin (MULTIVITAMIN) tablet Take 1 tablet by mouth daily.      Marland Kitchen NAPROXEN PO Take 1 tablet by mouth daily.       No current facility-administered medications for this visit.    Allergies:    Allergies  Allergen Reactions  . Sertraline Hcl     Tremor, HA, Decreased Libido    Social History:  The patient  reports that he has quit smoking. He does not have any smokeless tobacco history on file. He reports that he does not drink alcohol or use illicit drugs.   Family History:  The patient's family history is not on file.   ROS:  Please see the history of present illness.      All other systems reviewed and negative.   PHYSICAL EXAM: VS:  BP 136/76  Pulse 70  Ht 6'  1" (1.854 m)  Wt 284 lb (128.822 kg)  BMI 37.48 kg/m2 Well nourished, well developed, in no acute distress HEENT: normal Neck: no JVD Cardiac:  normal S1, S2; RRR; 1/6 SM at RUSB Lungs:  clear to auscultation bilaterally, no wheezing, rhonchi or rales Abd: soft, nontender, no hepatomegaly Ext: no edema Skin: warm and dry Neuro:  CNs 2-12 intact, no focal abnormalities noted  EKG:  NSR with LAFB and nonspecific ST abnormality     ASSESSMENT AND PLAN:  1. Pericarditis - no reoccurrence recently  - continue colchicine 2. Coronary artery calcifications on chest CT with family history of ASCAD  - with family history of CAD and age will get a baseline Stress myoview  - continue ASA 3. Family history of aortic aneurysm with chest CT in 2012 with no evidence of aneurysm 4. Dyslipidemia - he is adamant that he is not going on lipid lowering meds.  His LDL goal should be less than 100 due to coronary artery calcifications on chest CT 5. Heart Murmur  - check 2D echo to evaluate  Followup with me in 1 year  Signed, Serge Main  Radford Pax, MD 07/03/2013 2:45 PM

## 2013-07-16 ENCOUNTER — Encounter (HOSPITAL_COMMUNITY): Payer: Medicare Other

## 2013-07-23 ENCOUNTER — Ambulatory Visit (HOSPITAL_BASED_OUTPATIENT_CLINIC_OR_DEPARTMENT_OTHER): Payer: Medicare Other | Admitting: Radiology

## 2013-07-23 ENCOUNTER — Encounter: Payer: Self-pay | Admitting: Cardiovascular Disease

## 2013-07-23 ENCOUNTER — Ambulatory Visit (HOSPITAL_COMMUNITY): Payer: Medicare Other | Attending: Cardiovascular Disease | Admitting: Radiology

## 2013-07-23 VITALS — BP 155/96 | HR 63 | Ht 73.0 in | Wt 284.0 lb

## 2013-07-23 DIAGNOSIS — I2510 Atherosclerotic heart disease of native coronary artery without angina pectoris: Secondary | ICD-10-CM

## 2013-07-23 DIAGNOSIS — R011 Cardiac murmur, unspecified: Secondary | ICD-10-CM

## 2013-07-23 DIAGNOSIS — R9431 Abnormal electrocardiogram [ECG] [EKG]: Secondary | ICD-10-CM

## 2013-07-23 MED ORDER — TECHNETIUM TC 99M SESTAMIBI GENERIC - CARDIOLITE
10.0000 | Freq: Once | INTRAVENOUS | Status: AC | PRN
  Administered 2013-07-23: 10 via INTRAVENOUS

## 2013-07-23 MED ORDER — TECHNETIUM TC 99M SESTAMIBI GENERIC - CARDIOLITE
30.0000 | Freq: Once | INTRAVENOUS | Status: AC | PRN
  Administered 2013-07-23: 30 via INTRAVENOUS

## 2013-07-23 NOTE — Progress Notes (Signed)
Echocardiogram performed.  

## 2013-07-23 NOTE — Progress Notes (Signed)
[facility] [address] [place], [place] [address] [phone]    Cardiology Nuclear Med Study  Clarence Andrade is a 68 y.o. male     MRN : [id number]     DOB: 1946-04-04  Procedure Date: 07/23/2013  Nuclear Med Background Indication for Stress Test:  Evaluation for Ischemia and Abnormal EKG History:  No known CAD, Echo 2012 EF 57%, Cardiac CT (+coronary calc.) 2012, hx pericarditis Cardiac Risk Factors: Family History - CAD, History of Smoking and Lipids  Symptoms:  None indicated   Nuclear Pre-Procedure Caffeine/Decaff Intake:  None > 12 hrs NPO After: 9:00pm   Lungs:  clear O2 Sat: 95% on room air. IV 0.9% NS with Angio Cath:  22g  IV Site: R Wrist, tolerated well IV Started by:  Irven Baltimore, RN  Chest Size (in):  56 Cup Size: n/a  Height: 6\' 1"  (1.854 m)  Weight:  284 lb (128.822 kg)  BMI:  Body mass index is 37.48 kg/(m^2). Tech Comments:  N/A    Nuclear Med Study 1 or 2 day study: 1 day  Stress Test Type:  Stress  Reading MD: N/A  Order Authorizing Provider:  Fransico Him, MD  Resting Radionuclide: Technetium 93m Sestamibi  Resting Radionuclide Dose: 11.0 mCi   Stress Radionuclide:  Technetium 69m Sestamibi  Stress Radionuclide Dose: 33.0 mCi           Stress Protocol Rest HR: 63 Stress HR: 148  Rest BP: 155/96 Stress BP: 214/107  Exercise Time (min): 6:00 METS: 7.0           Dose of Adenosine (mg):  n/a Dose of Lexiscan: n/a mg  Dose of Atropine (mg): n/a Dose of Dobutamine: n/a mcg/kg/min (at max HR)  Stress Test Technologist: Glade Lloyd, BS-ES  Nuclear Technologist:  Charlton Amor, CNMT     Rest Procedure:  Myocardial perfusion imaging was performed at rest 45 minutes following the intravenous administration of Technetium 39m Sestamibi. Rest ECG: Sinus rhythm, first degree AV block, septal MI.  Stress Procedure:  The patient exercised on the treadmill utilizing the Bruce Protocol for 6:00 minutes. The patient  stopped due to fatigue and denied any chest pain.  Technetium 80m Sestamibi was injected at peak exercise and myocardial perfusion imaging was performed after a brief delay. Stress ECG: No significant ST segment change suggestive of ischemia.  QPS Raw Data Images:  Acquisition technically good; LVE. Stress Images:  Normal homogeneous uptake in all areas of the myocardium. Rest Images:  Normal homogeneous uptake in all areas of the myocardium. Subtraction (SDS):  No evidence of ischemia. Transient Ischemic Dilatation (Normal <1.22):  0.90 Lung/Heart Ratio (Normal <0.45):  0.43  Quantitative Gated Spect Images QGS EDV:  146 ml QGS ESV:  63 ml  Impression Exercise Capacity:  Fair exercise capacity. BP Response:  Normal blood pressure response. Clinical Symptoms:  No chest pain or dyspnea. ECG Impression:  No significant ST segment change suggestive of ischemia. Comparison with Prior Nuclear Study: No images to compare  Overall Impression:  Normal stress nuclear study.  LV Ejection Fraction: 57%.  LV Wall Motion:  NL LV Function; NL Wall Motion  [name]

## 2014-04-29 ENCOUNTER — Encounter: Payer: Self-pay | Admitting: Cardiology

## 2014-05-13 ENCOUNTER — Encounter: Payer: Self-pay | Admitting: Cardiology

## 2014-05-14 ENCOUNTER — Encounter: Payer: Self-pay | Admitting: Cardiology

## 2015-06-04 DIAGNOSIS — K5730 Diverticulosis of large intestine without perforation or abscess without bleeding: Secondary | ICD-10-CM | POA: Diagnosis not present

## 2015-06-04 DIAGNOSIS — Z1211 Encounter for screening for malignant neoplasm of colon: Secondary | ICD-10-CM | POA: Diagnosis not present

## 2016-02-19 ENCOUNTER — Other Ambulatory Visit: Payer: Self-pay | Admitting: Geriatric Medicine

## 2016-02-19 DIAGNOSIS — D3502 Benign neoplasm of left adrenal gland: Secondary | ICD-10-CM | POA: Diagnosis not present

## 2016-02-19 DIAGNOSIS — Z1389 Encounter for screening for other disorder: Secondary | ICD-10-CM | POA: Diagnosis not present

## 2016-02-19 DIAGNOSIS — R011 Cardiac murmur, unspecified: Secondary | ICD-10-CM | POA: Diagnosis not present

## 2016-02-19 DIAGNOSIS — G4733 Obstructive sleep apnea (adult) (pediatric): Secondary | ICD-10-CM | POA: Diagnosis not present

## 2016-02-19 DIAGNOSIS — Z79899 Other long term (current) drug therapy: Secondary | ICD-10-CM | POA: Diagnosis not present

## 2016-02-19 DIAGNOSIS — E7800 Pure hypercholesterolemia, unspecified: Secondary | ICD-10-CM | POA: Diagnosis not present

## 2016-02-19 DIAGNOSIS — Z6841 Body Mass Index (BMI) 40.0 and over, adult: Secondary | ICD-10-CM | POA: Diagnosis not present

## 2016-02-19 DIAGNOSIS — Z0000 Encounter for general adult medical examination without abnormal findings: Secondary | ICD-10-CM | POA: Diagnosis not present

## 2016-03-01 ENCOUNTER — Other Ambulatory Visit: Payer: Self-pay

## 2016-03-07 ENCOUNTER — Other Ambulatory Visit: Payer: Self-pay

## 2016-03-07 ENCOUNTER — Ambulatory Visit (HOSPITAL_COMMUNITY): Payer: PPO | Attending: Cardiology

## 2016-03-07 DIAGNOSIS — R011 Cardiac murmur, unspecified: Secondary | ICD-10-CM | POA: Diagnosis not present

## 2016-04-13 ENCOUNTER — Ambulatory Visit (INDEPENDENT_AMBULATORY_CARE_PROVIDER_SITE_OTHER): Payer: PPO

## 2016-04-13 ENCOUNTER — Encounter: Payer: Self-pay | Admitting: Podiatry

## 2016-04-13 ENCOUNTER — Ambulatory Visit (INDEPENDENT_AMBULATORY_CARE_PROVIDER_SITE_OTHER): Payer: PPO | Admitting: Podiatry

## 2016-04-13 VITALS — BP 160/87 | HR 80 | Resp 18

## 2016-04-13 DIAGNOSIS — M722 Plantar fascial fibromatosis: Secondary | ICD-10-CM

## 2016-04-13 NOTE — Progress Notes (Signed)
   Subjective:    Patient ID: Clarence Andrade, male    DOB: 08-10-1945, 70 y.o.   MRN: [id number]  HPI   I have some right heel pain and it has been going on for about a month and the pain comes and goes and is sore and tender and hurts on the bottom and hurts after I have sit for a while and no discomfort first step  This patient presents today complaining of right inferior heel pain for approximately one month. The heel pain is most noticeable after standing and walking approximately 15 minutes and is relieved with rest and elevation. Patient has changed shoes and has tried an over-the-counter arch support with minimal reduction of pain. He denies any previous history or injury. He describes exercise at gym including treadmill which she says that is not exacerbate the heel pain.  Patient is a nonsmoker  Review of Systems  All other systems reviewed and are negative.      Objective:   Physical Exam  Orientated 3   Patient states he 6 feet and weighs approximately 310 pounds  Vascular: DP and PT pulses 2/4 bilaterally Capillary reflex immediate bilaterally  Neurological: Sensation to 10 g monofilament wire intact 3/5 right and 4/5 left Vibratory sensation nonreactive right reactive left Ankle reflex equal and reactive bilaterally  Dermatological: No open skin lesions bilaterally Texture and turgor within normal limits bilaterally  Musculoskeletal: Palpable tenderness medial plantar fascial insertional area right heel without any palpable lesions. This duplicates patient's discomfort. Fat pads are adequate bilaterally There is no restriction ankle, subtalar, midtarsal joints bilaterally Manual motor testing dorsi flexion, plantar flexion, inversion, eversion 5/5 bilaterally    Study Result    X-ray examination weightbearing right foot dated 04/13/2016  Intact bony structures without fracture and/or dislocation Mild midfoot degenerative changes Posterior and  inferior calcaneal spurs Metatarsus adductus  Radiographic impression: No acute bony abnormality noted in the weightbearing x-ray of the right foot dated 04/13/2016           Assessment & Plan:    Assessment: Mild peripheral neuropathy Plantar fasciitis right  Plan: I reviewed the results of the exam an x-ray with patient today in detail. We discussed treatment options including shoeing, stretching. Also discussed substituting treadmill with exercise bike Fasciitis strap dispensed to wear and right foot/ankle daily  Patient will return if symptoms do not 10 to improve in the next 4-6 weeks

## 2016-04-13 NOTE — Patient Instructions (Signed)
Stretch 1 Before changing from a seated to standing position toes toward your nose Stretch to With shoes on leaving against the wall keeping her knee straight and your feet flat on the floor Repeat stretches 30-60 seconds as needed Please wear sturdy athletic style shoe and your fasciitis strap If the heel pain is not reducing the next 4-6 weeks return for further evaluation  Plantar Fasciitis Plantar fasciitis is a painful foot condition that affects the heel. It occurs when the band of tissue that connects the toes to the heel bone (plantar fascia) becomes irritated. This can happen after exercising too much or doing other repetitive activities (overuse injury). The pain from plantar fasciitis can range from mild irritation to severe pain that makes it difficult for you to walk or move. The pain is usually worse in the morning or after you have been sitting or lying down for a while. CAUSES This condition may be caused by:  Standing for long periods of time.  Wearing shoes that do not fit.  Doing high-impact activities, including running, aerobics, and ballet.  Being overweight.  Having an abnormal way of walking (gait).  Having tight calf muscles.  Having high arches in your feet.  Starting a new athletic activity. SYMPTOMS The main symptom of this condition is heel pain. Other symptoms include:  Pain that gets worse after activity or exercise.  Pain that is worse in the morning or after resting.  Pain that goes away after you walk for a few minutes. DIAGNOSIS This condition may be diagnosed based on your signs and symptoms. Your health care provider will also do a physical exam to check for:  A tender area on the bottom of your foot.  A high arch in your foot.  Pain when you move your foot.  Difficulty moving your foot. You may also need to have imaging studies to confirm the diagnosis. These can include:  X-rays.  Ultrasound.  MRI. TREATMENT  Treatment for  plantar fasciitis depends on the severity of the condition. Your treatment may include:  Rest, ice, and over-the-counter pain medicines to manage your pain.  Exercises to stretch your calves and your plantar fascia.  A splint that holds your foot in a stretched, upward position while you sleep (night splint).  Physical therapy to relieve symptoms and prevent problems in the future.  Cortisone injections to relieve severe pain.  Extracorporeal shock wave therapy (ESWT) to stimulate damaged plantar fascia with electrical impulses. It is often used as a last resort before surgery.  Surgery, if other treatments have not worked after 12 months. HOME CARE INSTRUCTIONS  Take medicines only as directed by your health care provider.  Avoid activities that cause pain.  Roll the bottom of your foot over a bag of ice or a bottle of cold water. Do this for 20 minutes, 3-4 times a day.  Perform simple stretches as directed by your health care provider.  Try wearing athletic shoes with air-sole or gel-sole cushions or soft shoe inserts.  Wear a night splint while sleeping, if directed by your health care provider.  Keep all follow-up appointments with your health care provider. PREVENTION   Do not perform exercises or activities that cause heel pain.  Consider finding low-impact activities if you continue to have problems.  Lose weight if you need to. The best way to prevent plantar fasciitis is to avoid the activities that aggravate your plantar fascia. SEEK MEDICAL CARE IF:  Your symptoms do not go away after  treatment with home care measures.  Your pain gets worse.  Your pain affects your ability to move or do your daily activities. This information is not intended to replace advice given to you by your health care provider. Make sure you discuss any questions you have with your health care provider. Document Released: 01/18/2001 Document Revised: 08/17/2015 Document Reviewed:  03/05/2014 Elsevier Interactive Patient Education  2017 [employer].

## 2016-05-31 ENCOUNTER — Ambulatory Visit (INDEPENDENT_AMBULATORY_CARE_PROVIDER_SITE_OTHER): Payer: PPO | Admitting: Podiatry

## 2016-05-31 ENCOUNTER — Encounter: Payer: Self-pay | Admitting: Podiatry

## 2016-05-31 VITALS — BP 136/85 | HR 64 | Resp 18

## 2016-05-31 DIAGNOSIS — M722 Plantar fascial fibromatosis: Secondary | ICD-10-CM | POA: Diagnosis not present

## 2016-05-31 NOTE — Progress Notes (Signed)
Patient ID: Clarence Andrade, male   DOB: 1945/12/17, 71 y.o.   MRN: [id number]   Subjective: Patient presents again complaining of right inferior heel pain associated when he changed from seated to standing position prolonged standing and walking. He initially presented in December 2017 with a one-month history of this complaint. At that initial visit a fasciitis strap was dispensed and stretching prescribed. Patient has been compliant with these instructions, however, the right inferior heel pain has persisted without any improvement  Objective:  Vascular: DP and PT pulses 2/4 bilaterally Capillary reflex immediate bilaterally  Neurological: Sensation to 10 g monofilament wire intact 3/5 right and 4/5 left Vibratory sensation nonreactive right reactive left Ankle reflex equal and reactive bilaterally  Dermatological: No open skin lesions bilaterally Texture and turgor within normal limits bilaterally  Musculoskeletal: Palpable tenderness medial plantar fascial insertional area right heel without any palpable lesions. This duplicates patient's discomfort. Fat pads are adequate bilaterally There is no restriction ankle, subtalar, midtarsal joints bilaterally Manual motor testing dorsi flexion, plantar flexion, inversion, eversion 5/5 bilaterally  Assessment: Plantar fasciitis right  Plan: Discuss fasciitis in general. Recommended Kenalog injection and patient verbally consents  Skin is prepped with alcohol and Betadine and 10 mg of Kenalog mixed with 10 mg of plain Xylocaine and 2.5 mg of plain Sensorcaine were injected inferiorly he'll write for Kenalog injection #1. Patient tolerated procedure without any difficulty  Patient instructed to maintain stretching, plantar fasciitis strap and suggested over-the-counter soft arch support  Reappoint at patient's request

## 2016-05-31 NOTE — Patient Instructions (Signed)
Continue  wearing the fasciitis strap on the right foot Continue stretching Okay to use a soft over-the-counter arch support in conjunction with the fasciitis strap  Plantar Fasciitis Plantar fasciitis is a painful foot condition that affects the heel. It occurs when the band of tissue that connects the toes to the heel bone (plantar fascia) becomes irritated. This can happen after exercising too much or doing other repetitive activities (overuse injury). The pain from plantar fasciitis can range from mild irritation to severe pain that makes it difficult for you to walk or move. The pain is usually worse in the morning or after you have been sitting or lying down for a while. CAUSES This condition may be caused by:  Standing for long periods of time.  Wearing shoes that do not fit.  Doing high-impact activities, including running, aerobics, and ballet.  Being overweight.  Having an abnormal way of walking (gait).  Having tight calf muscles.  Having high arches in your feet.  Starting a new athletic activity. SYMPTOMS The main symptom of this condition is heel pain. Other symptoms include:  Pain that gets worse after activity or exercise.  Pain that is worse in the morning or after resting.  Pain that goes away after you walk for a few minutes. DIAGNOSIS This condition may be diagnosed based on your signs and symptoms. Your health care provider will also do a physical exam to check for:  A tender area on the bottom of your foot.  A high arch in your foot.  Pain when you move your foot.  Difficulty moving your foot. You may also need to have imaging studies to confirm the diagnosis. These can include:  X-rays.  Ultrasound.  MRI. TREATMENT  Treatment for plantar fasciitis depends on the severity of the condition. Your treatment may include:  Rest, ice, and over-the-counter pain medicines to manage your pain.  Exercises to stretch your calves and your plantar  fascia.  A splint that holds your foot in a stretched, upward position while you sleep (night splint).  Physical therapy to relieve symptoms and prevent problems in the future.  Cortisone injections to relieve severe pain.  Extracorporeal shock wave therapy (ESWT) to stimulate damaged plantar fascia with electrical impulses. It is often used as a last resort before surgery.  Surgery, if other treatments have not worked after 12 months. HOME CARE INSTRUCTIONS  Take medicines only as directed by your health care provider.  Avoid activities that cause pain.  Roll the bottom of your foot over a bag of ice or a bottle of cold water. Do this for 20 minutes, 3-4 times a day.  Perform simple stretches as directed by your health care provider.  Try wearing athletic shoes with air-sole or gel-sole cushions or soft shoe inserts.  Wear a night splint while sleeping, if directed by your health care provider.  Keep all follow-up appointments with your health care provider. PREVENTION   Do not perform exercises or activities that cause heel pain.  Consider finding low-impact activities if you continue to have problems.  Lose weight if you need to. The best way to prevent plantar fasciitis is to avoid the activities that aggravate your plantar fascia. SEEK MEDICAL CARE IF:  Your symptoms do not go away after treatment with home care measures.  Your pain gets worse.  Your pain affects your ability to move or do your daily activities. This information is not intended to replace advice given to you by your health care provider.  Make sure you discuss any questions you have with your health care provider. Document Released: 01/18/2001 Document Revised: 08/17/2015 Document Reviewed: 03/05/2014 Elsevier Interactive Patient Education  2017 [employer].

## 2016-10-04 DIAGNOSIS — H52203 Unspecified astigmatism, bilateral: Secondary | ICD-10-CM | POA: Diagnosis not present

## 2016-10-04 DIAGNOSIS — H2513 Age-related nuclear cataract, bilateral: Secondary | ICD-10-CM | POA: Diagnosis not present

## 2016-10-04 DIAGNOSIS — H33302 Unspecified retinal break, left eye: Secondary | ICD-10-CM | POA: Diagnosis not present

## 2016-10-05 DIAGNOSIS — H43813 Vitreous degeneration, bilateral: Secondary | ICD-10-CM | POA: Diagnosis not present

## 2016-10-05 DIAGNOSIS — H33302 Unspecified retinal break, left eye: Secondary | ICD-10-CM | POA: Diagnosis not present

## 2017-01-10 DIAGNOSIS — H33302 Unspecified retinal break, left eye: Secondary | ICD-10-CM | POA: Diagnosis not present

## 2017-01-10 DIAGNOSIS — H43812 Vitreous degeneration, left eye: Secondary | ICD-10-CM | POA: Diagnosis not present

## 2017-03-03 DIAGNOSIS — M25511 Pain in right shoulder: Secondary | ICD-10-CM | POA: Diagnosis not present

## 2017-03-03 DIAGNOSIS — H9313 Tinnitus, bilateral: Secondary | ICD-10-CM | POA: Diagnosis not present

## 2017-03-03 DIAGNOSIS — D696 Thrombocytopenia, unspecified: Secondary | ICD-10-CM | POA: Diagnosis not present

## 2017-03-03 DIAGNOSIS — Z125 Encounter for screening for malignant neoplasm of prostate: Secondary | ICD-10-CM | POA: Diagnosis not present

## 2017-03-03 DIAGNOSIS — Z79899 Other long term (current) drug therapy: Secondary | ICD-10-CM | POA: Diagnosis not present

## 2017-03-03 DIAGNOSIS — Z1389 Encounter for screening for other disorder: Secondary | ICD-10-CM | POA: Diagnosis not present

## 2017-03-03 DIAGNOSIS — Z0000 Encounter for general adult medical examination without abnormal findings: Secondary | ICD-10-CM | POA: Diagnosis not present

## 2017-03-03 DIAGNOSIS — E7800 Pure hypercholesterolemia, unspecified: Secondary | ICD-10-CM | POA: Diagnosis not present

## 2017-03-03 DIAGNOSIS — G8929 Other chronic pain: Secondary | ICD-10-CM | POA: Diagnosis not present

## 2017-03-22 DIAGNOSIS — M25511 Pain in right shoulder: Secondary | ICD-10-CM | POA: Diagnosis not present

## 2017-03-22 DIAGNOSIS — M7541 Impingement syndrome of right shoulder: Secondary | ICD-10-CM | POA: Diagnosis not present

## 2017-04-03 DIAGNOSIS — D696 Thrombocytopenia, unspecified: Secondary | ICD-10-CM | POA: Diagnosis not present

## 2017-04-26 DIAGNOSIS — M7541 Impingement syndrome of right shoulder: Secondary | ICD-10-CM | POA: Diagnosis not present

## 2017-05-08 DIAGNOSIS — M75121 Complete rotator cuff tear or rupture of right shoulder, not specified as traumatic: Secondary | ICD-10-CM | POA: Diagnosis not present

## 2017-05-17 DIAGNOSIS — H43812 Vitreous degeneration, left eye: Secondary | ICD-10-CM | POA: Diagnosis not present

## 2017-05-17 DIAGNOSIS — H33302 Unspecified retinal break, left eye: Secondary | ICD-10-CM | POA: Diagnosis not present

## 2017-06-08 DIAGNOSIS — M958 Other specified acquired deformities of musculoskeletal system: Secondary | ICD-10-CM | POA: Diagnosis not present

## 2017-06-08 DIAGNOSIS — S46111A Strain of muscle, fascia and tendon of long head of biceps, right arm, initial encounter: Secondary | ICD-10-CM | POA: Diagnosis not present

## 2017-06-08 DIAGNOSIS — M19011 Primary osteoarthritis, right shoulder: Secondary | ICD-10-CM | POA: Diagnosis not present

## 2017-06-08 DIAGNOSIS — S43431A Superior glenoid labrum lesion of right shoulder, initial encounter: Secondary | ICD-10-CM | POA: Diagnosis not present

## 2017-06-08 DIAGNOSIS — S46191A Other injury of muscle, fascia and tendon of long head of biceps, right arm, initial encounter: Secondary | ICD-10-CM | POA: Diagnosis not present

## 2017-06-08 DIAGNOSIS — S46011A Strain of muscle(s) and tendon(s) of the rotator cuff of right shoulder, initial encounter: Secondary | ICD-10-CM | POA: Diagnosis not present

## 2017-06-08 DIAGNOSIS — M75121 Complete rotator cuff tear or rupture of right shoulder, not specified as traumatic: Secondary | ICD-10-CM | POA: Diagnosis not present

## 2017-06-08 DIAGNOSIS — G8918 Other acute postprocedural pain: Secondary | ICD-10-CM | POA: Diagnosis not present

## 2017-06-16 DIAGNOSIS — Z4789 Encounter for other orthopedic aftercare: Secondary | ICD-10-CM | POA: Diagnosis not present

## 2017-06-16 DIAGNOSIS — M25611 Stiffness of right shoulder, not elsewhere classified: Secondary | ICD-10-CM | POA: Diagnosis not present

## 2017-06-16 DIAGNOSIS — Z9889 Other specified postprocedural states: Secondary | ICD-10-CM | POA: Diagnosis not present

## 2017-06-20 DIAGNOSIS — M25611 Stiffness of right shoulder, not elsewhere classified: Secondary | ICD-10-CM | POA: Diagnosis not present

## 2017-06-23 DIAGNOSIS — M25611 Stiffness of right shoulder, not elsewhere classified: Secondary | ICD-10-CM | POA: Diagnosis not present

## 2017-06-27 DIAGNOSIS — M25611 Stiffness of right shoulder, not elsewhere classified: Secondary | ICD-10-CM | POA: Diagnosis not present

## 2017-06-29 DIAGNOSIS — M9903 Segmental and somatic dysfunction of lumbar region: Secondary | ICD-10-CM | POA: Diagnosis not present

## 2017-06-29 DIAGNOSIS — M9905 Segmental and somatic dysfunction of pelvic region: Secondary | ICD-10-CM | POA: Diagnosis not present

## 2017-06-29 DIAGNOSIS — M5136 Other intervertebral disc degeneration, lumbar region: Secondary | ICD-10-CM | POA: Diagnosis not present

## 2017-06-29 DIAGNOSIS — M25611 Stiffness of right shoulder, not elsewhere classified: Secondary | ICD-10-CM | POA: Diagnosis not present

## 2017-07-04 DIAGNOSIS — M25611 Stiffness of right shoulder, not elsewhere classified: Secondary | ICD-10-CM | POA: Diagnosis not present

## 2017-07-10 DIAGNOSIS — M25611 Stiffness of right shoulder, not elsewhere classified: Secondary | ICD-10-CM | POA: Diagnosis not present

## 2017-07-14 DIAGNOSIS — M25611 Stiffness of right shoulder, not elsewhere classified: Secondary | ICD-10-CM | POA: Diagnosis not present

## 2017-07-18 DIAGNOSIS — M25611 Stiffness of right shoulder, not elsewhere classified: Secondary | ICD-10-CM | POA: Diagnosis not present

## 2017-07-21 DIAGNOSIS — M25611 Stiffness of right shoulder, not elsewhere classified: Secondary | ICD-10-CM | POA: Diagnosis not present

## 2017-07-25 DIAGNOSIS — M25611 Stiffness of right shoulder, not elsewhere classified: Secondary | ICD-10-CM | POA: Diagnosis not present

## 2017-08-01 DIAGNOSIS — M25611 Stiffness of right shoulder, not elsewhere classified: Secondary | ICD-10-CM | POA: Diagnosis not present

## 2017-08-04 DIAGNOSIS — M25611 Stiffness of right shoulder, not elsewhere classified: Secondary | ICD-10-CM | POA: Diagnosis not present

## 2017-08-08 DIAGNOSIS — M25611 Stiffness of right shoulder, not elsewhere classified: Secondary | ICD-10-CM | POA: Diagnosis not present

## 2017-08-11 DIAGNOSIS — M25611 Stiffness of right shoulder, not elsewhere classified: Secondary | ICD-10-CM | POA: Diagnosis not present

## 2017-08-14 DIAGNOSIS — M75101 Unspecified rotator cuff tear or rupture of right shoulder, not specified as traumatic: Secondary | ICD-10-CM | POA: Insufficient documentation

## 2017-11-27 DIAGNOSIS — H35362 Drusen (degenerative) of macula, left eye: Secondary | ICD-10-CM | POA: Diagnosis not present

## 2017-11-27 DIAGNOSIS — H33322 Round hole, left eye: Secondary | ICD-10-CM | POA: Diagnosis not present

## 2017-11-27 DIAGNOSIS — H43813 Vitreous degeneration, bilateral: Secondary | ICD-10-CM | POA: Diagnosis not present

## 2017-12-11 DIAGNOSIS — H33322 Round hole, left eye: Secondary | ICD-10-CM | POA: Diagnosis not present

## 2018-03-07 ENCOUNTER — Ambulatory Visit: Payer: PPO | Admitting: Cardiology

## 2018-03-07 VITALS — BP 140/88 | HR 60 | Ht 73.0 in | Wt 298.0 lb

## 2018-03-07 DIAGNOSIS — I318 Other specified diseases of pericardium: Secondary | ICD-10-CM

## 2018-03-07 DIAGNOSIS — R079 Chest pain, unspecified: Secondary | ICD-10-CM | POA: Insufficient documentation

## 2018-03-07 DIAGNOSIS — R011 Cardiac murmur, unspecified: Secondary | ICD-10-CM | POA: Diagnosis not present

## 2018-03-07 DIAGNOSIS — I2510 Atherosclerotic heart disease of native coronary artery without angina pectoris: Secondary | ICD-10-CM

## 2018-03-07 NOTE — Progress Notes (Deleted)
Cardiology Office Note:    Date:  03/07/2018   ID:  Clarence Andrade, DOB 08-20-1945, MRN [id number]  PCP:  Lajean Manes, MD  Cardiologist:  No primary care provider on file.    Referring MD: Lajean Manes, MD   No chief complaint on file.   History of Present Illness:    Clarence Andrade is a 72 y.o. male with a hx of pericarditis, coronary artery calcifications on chest CT and OSA (refused treatment) who is here for followup. He has not been seen since 2015.  He is here today for followup and is doing well.  He denies any chest pain or pressure, SOB, DOE, PND, orthopnea, LE edema, dizziness, palpitations or syncope. He is compliant with his meds and is tolerating meds with no SE.    Past Medical History:  Diagnosis Date  . Abdominal hernia    Midline  . Adrenal incidentaloma ([place]) 2010  . OSA (obstructive sleep apnea)    Severe PSG 1/07 AHI 66/hr, O2 Nadir 50% Refuses treatment  . Pericarditis    now on colchicine  . Screening for AAA (abdominal aortic aneurysm) 2010   CT Abd/Pelvis  . Ventral hernia    03/2009    Past Surgical History:  Procedure Laterality Date  . HERNIA REPAIR  1/04  . KNEE ARTHROSCOPY Left 05/04  . TOTAL KNEE ARTHROPLASTY Bilateral 05/12  . VENTRAL HERNIA REPAIR  03/2009    Current Medications: No outpatient medications have been marked as taking for the 03/07/18 encounter (Appointment) with Sueanne Margarita, MD.     Allergies:   Sertraline hcl   Social History   Socioeconomic History  . Marital status: Married    Spouse name: Not on file  . Number of children: Not on file  . Years of education: Not on file  . Highest education level: Not on file  Occupational History  . Not on file  Social Needs  . Financial resource strain: Not on file  . Food insecurity:    Worry: Not on file    Inability: Not on file  . Transportation needs:    Medical: Not on file    Non-medical: Not on file  Tobacco Use  . Smoking status: Former Research scientist (life sciences)  .  Smokeless tobacco: Never Used  Substance and Sexual Activity  . Alcohol use: No  . Drug use: No  . Sexual activity: Not on file  Lifestyle  . Physical activity:    Days per week: Not on file    Minutes per session: Not on file  . Stress: Not on file  Relationships  . Social connections:    Talks on phone: Not on file    Gets together: Not on file    Attends religious service: Not on file    Active member of club or organization: Not on file    Attends meetings of clubs or organizations: Not on file    Relationship status: Not on file  Other Topics Concern  . Not on file  Social History Narrative  . Not on file     Family History: The patient's family history includes Aneurysm in his father; Heart attack in his father; Heart disease in his father.  ROS:   Please see the history of present illness.    ROS  All other systems reviewed and negative.   EKGs/Labs/Other Studies Reviewed:    The following studies were reviewed today: none  EKG:  EKG is  ordered today.  The ekg ordered  today demonstrates   Recent Labs: No results found for requested labs within last 8760 hours.   Recent Lipid Panel    Component Value Date/Time   CHOL  06/05/2010 0715    138        ATP III CLASSIFICATION:  <200     mg/dL   Desirable  200-239  mg/dL   Borderline High  >=240    mg/dL   High          TRIG 51 06/05/2010 0715   HDL 43 06/05/2010 0715   CHOLHDL 3.2 06/05/2010 0715   VLDL 10 06/05/2010 0715   LDLCALC  06/05/2010 0715    85        Total Cholesterol/HDL:CHD Risk Coronary Heart Disease Risk Table                     Men   Women  1/2 Average Risk   3.4   3.3  Average Risk       5.0   4.4  2 X Average Risk   9.6   7.1  3 X Average Risk  23.4   11.0        Use the calculated Patient Ratio above and the CHD Risk Table to determine the patient's CHD Risk.        ATP III CLASSIFICATION (LDL):  <100     mg/dL   Optimal  100-129  mg/dL   Near or Above                     Optimal  130-159  mg/dL   Borderline  160-189  mg/dL   High  >190     mg/dL   Very High    Physical Exam:    VS:  There were no vitals taken for this visit.    Wt Readings from Last 3 Encounters:  07/23/13 284 lb (128.8 kg)  07/03/13 284 lb (128.8 kg)     GEN:  Well nourished, well developed in no acute distress HEENT: Normal NECK: No JVD; No carotid bruits LYMPHATICS: No lymphadenopathy CARDIAC: RRR, no murmurs, rubs, gallops RESPIRATORY:  Clear to auscultation without rales, wheezing or rhonchi  ABDOMEN: Soft, non-tender, non-distended MUSCULOSKELETAL:  No edema; No deformity  SKIN: Warm and dry NEUROLOGIC:  Alert and oriented x 3 PSYCHIATRIC:  Normal affect   ASSESSMENT:    1. Coronary artery calcification seen on CAT scan   2. Other pericarditis, unspecified chronicity    PLAN:    In order of problems listed above:  1.  Coronary artery calcifications on Chest CT - he denies any chest pain or pressure or SOB.  His LDL was 131 a year ago.  I will get a coronary calcium score to assess future risk.  Continue ASA 81mg  daily.   2.  H/O Pericarditis - he denies any chest pain.  Since he has not had any reoccurrence I have told him to stop Colchicine.  If he has reoccurrence of CP he will let me know.     Medication Adjustments/Labs and Tests Ordered: Current medicines are reviewed at length with the patient today.  Concerns regarding medicines are outlined above.  No orders of the defined types were placed in this encounter.  No orders of the defined types were placed in this encounter.   Signed, Fransico Him, MD  03/07/2018 9:08 AM    [place]

## 2018-03-07 NOTE — Patient Instructions (Signed)
Medication Instructions:  Your physician recommends that you continue on your current medications as directed. Please refer to the Current Medication list given to you today.  If you need a refill on your cardiac medications before your next appointment, please call your pharmacy.   Lab work: Today: ProBNP, DDimer, Sed Rate, CRP If you have labs (blood work) drawn today and your tests are completely normal, you will receive your results only by: Marland Kitchen MyChart Message (if you have MyChart) OR . A paper copy in the mail If you have any lab test that is abnormal or we need to change your treatment, we will call you to review the results.  Testing/Procedures: Your physician has requested that you have an echocardiogram. Echocardiography is a painless test that uses sound waves to create images of your heart. It provides your doctor with information about the size and shape of your heart and how well your heart's chambers and valves are working. This procedure takes approximately one hour. There are no restrictions for this procedure.  Your physician has requested that you have en exercise stress myoview. For further information please visit [email or web address]. Please follow instruction sheet, as given.  Follow-Up: At [facility], you and your health needs are our priority.  As part of our continuing mission to provide you with exceptional heart care, we have created designated Provider Care Teams.  These Care Teams include your primary Cardiologist (physician) and Advanced Practice Providers (APPs -  Physician Assistants and Nurse Practitioners) who all work together to provide you with the care you need, when you need it. You will need a follow up appointment in 1 years.  Please call our office 2 months in advance to schedule this appointment.  You may see Dr. Radford Pax or one of the following Advanced Practice Providers on your designated Care Team:   [place], PA-C Melina Copa,  PA-C . Ermalinda Barrios, PA-C

## 2018-03-07 NOTE — Progress Notes (Addendum)
Cardiology Office Note    Date:  03/07/2018   ID:  Clarence Andrade, DOB Jul 03, 1945, MRN [id number]  PCP:  Lajean Manes, MD  Cardiologist:  Fransico Him, MD   Chief Complaint  Patient presents with  . Follow-up    coronary artery calcifications, pericarditis    History of Present Illness:  Clarence Andrade is a 72 y.o. male who is being seen today for the evaluation of coronary artery calcifications and h/o pericarditis at the request of Stoneking, Hal, MD.  This is a 72yo male with a history of pericarditis and coronary artery calcifications by prior chest CT.  He is here today for followup.  He has not been feeling well since last February.  He has been having exertional chest pain intermittently but has become more frequent and more severe months ago.  He is concerned that he may have pericarditis back again.  The discomfort is a tightness that only occurs when he exerts himself.  There is no relation to changes in position.  The chest tightness is associated with shortness of breath and sometimes nausea and diaphoresis.  There is no radiation of the discomfort.  He has not had any discomfort at rest.  He is also noted exertional dyspnea on exertion when walking to the blocks.  He denies any  PND, orthopnea, LE edema, dizziness, palpitations or syncope. He is compliant with his meds and is tolerating meds with no SE.    Past Medical History:  Diagnosis Date  . Abdominal hernia    Midline  . Adrenal incidentaloma ([place]) 2010  . OSA (obstructive sleep apnea)    Severe PSG 1/07 AHI 66/hr, O2 Nadir 50% Refuses treatment  . Pericarditis    now on colchicine  . Screening for AAA (abdominal aortic aneurysm) 2010   CT Abd/Pelvis  . Ventral hernia    03/2009    Past Surgical History:  Procedure Laterality Date  . HERNIA REPAIR  1/04  . KNEE ARTHROSCOPY Left 05/04  . TOTAL KNEE ARTHROPLASTY Bilateral 05/12  . VENTRAL HERNIA REPAIR  03/2009    Current Medications: Current Meds    Medication Sig  . colchicine 0.6 MG tablet Take 0.6 mg by mouth daily.  . Multiple Vitamin (MULTIVITAMIN) tablet Take 1 tablet by mouth daily.  Marland Kitchen NAPROXEN PO Take 1 tablet by mouth as needed.     Allergies:   Sertraline hcl   Social History   Socioeconomic History  . Marital status: Married    Spouse name: Not on file  . Number of children: Not on file  . Years of education: Not on file  . Highest education level: Not on file  Occupational History  . Not on file  Social Needs  . Financial resource strain: Not on file  . Food insecurity:    Worry: Not on file    Inability: Not on file  . Transportation needs:    Medical: Not on file    Non-medical: Not on file  Tobacco Use  . Smoking status: Former Research scientist (life sciences)  . Smokeless tobacco: Never Used  Substance and Sexual Activity  . Alcohol use: No  . Drug use: No  . Sexual activity: Not on file  Lifestyle  . Physical activity:    Days per week: Not on file    Minutes per session: Not on file  . Stress: Not on file  Relationships  . Social connections:    Talks on phone: Not on file    Gets together:  Not on file    Attends religious service: Not on file    Active member of club or organization: Not on file    Attends meetings of clubs or organizations: Not on file    Relationship status: Not on file  Other Topics Concern  . Not on file  Social History Narrative  . Not on file     Family History:  The patient's family history includes Aneurysm in his father; Heart attack in his father; Heart disease in his father.   ROS:   Please see the history of present illness.    ROS All other systems reviewed and are negative.  No flowsheet data found.   PHYSICAL EXAM:   VS:  BP 140/88   Pulse 60   Ht 6\' 1"  (1.854 m)   Wt 298 lb (135.2 kg)   BMI 39.32 kg/m    GEN: Well nourished, well developed, in no acute distress  HEENT: normal  Neck: no JVD, carotid bruits, or masses Cardiac: RRR; no  rubs, or gallops,no edema.   Intact distal pulses bilaterally.  2/6 systolic murmur the right upper sternal border Respiratory:  clear to auscultation bilaterally, normal work of breathing GI: soft, nontender, nondistended, + BS MS: no deformity or atrophy  Skin: warm and dry, no rash Neuro:  Alert and Oriented x 3, Strength and sensation are intact Psych: euthymic mood, full affect  Wt Readings from Last 3 Encounters:  03/07/18 298 lb (135.2 kg)  07/23/13 284 lb (128.8 kg)  07/03/13 284 lb (128.8 kg)      Studies/Labs Reviewed:   EKG:  EKG is ordered today.  The ekg ordered today demonstrates NSR with ST/T wave abnormality c/w lateral ischemia - no change compared to 2015  Recent Labs: No results found for requested labs within last 8760 hours.   Lipid Panel    Component Value Date/Time   CHOL  06/05/2010 0715    138        ATP III CLASSIFICATION:  <200     mg/dL   Desirable  200-239  mg/dL   Borderline High  >=240    mg/dL   High          TRIG 51 06/05/2010 0715   HDL 43 06/05/2010 0715   CHOLHDL 3.2 06/05/2010 0715   VLDL 10 06/05/2010 0715   LDLCALC  06/05/2010 0715    85        Total Cholesterol/HDL:CHD Risk Coronary Heart Disease Risk Table                     Men   Women  1/2 Average Risk   3.4   3.3  Average Risk       5.0   4.4  2 X Average Risk   9.6   7.1  3 X Average Risk  23.4   11.0        Use the calculated Patient Ratio above and the CHD Risk Table to determine the patient's CHD Risk.        ATP III CLASSIFICATION (LDL):  <100     mg/dL   Optimal  100-129  mg/dL   Near or Above                    Optimal  130-159  mg/dL   Borderline  160-189  mg/dL   High  >190     mg/dL   Very High    Additional studies/ records that  were reviewed today include:  none    ASSESSMENT:    1. Coronary artery calcification seen on CAT scan   2. Exertional chest pain   3. Other pericarditis, unspecified chronicity   4. Heart murmur      PLAN:  In order of problems listed  above:  1.  Exertional chest pain -I do not think this is related to underlying pericarditis.  It is purely exertional with associated symptoms of nausea and diaphoresis as well as shortness of breath concerning for exertional angina.  I am going to get a 2D echocardiogram to rule out pericardial effusion and a stress Myoview to rule out ischemia.  I told him not to do any exertional activities until stress test is been completed.  I will check a sed rate and CRP.  2.  Coronary artery calcifications on Chest CT - His LDL was 131 a year ago.  See above, given chest pain will get a nuclear stress test.  Continue ASA 81mg  daily.   3.  H/O Pericarditis - he has been having chest pain is pericarditis but it is only no change in position associated with discomfort.  He has no rest pain and is not pleuritic.  As stated above, we will check an echo to reassess.  4.  Heart murmur -he has a systolic heart murmur the right upper sternal border that is likely related to aortic valve sclerosis.  This was noted on echo in 2017.  I will repeat an echo today to make sure this is not progressed aortic stenosis.     Medication Adjustments/Labs and Tests Ordered: Current medicines are reviewed at length with the patient today.  Concerns regarding medicines are outlined above.  Medication changes, Labs and Tests ordered today are listed in the Patient Instructions below.  Patient Instructions  Medication Instructions:  Your physician recommends that you continue on your current medications as directed. Please refer to the Current Medication list given to you today.  If you need a refill on your cardiac medications before your next appointment, please call your pharmacy.   Lab work: Today: ProBNP, DDimer, Sed Rate, CRP If you have labs (blood work) drawn today and your tests are completely normal, you will receive your results only by: Marland Kitchen MyChart Message (if you have MyChart) OR . A paper copy in the mail If  you have any lab test that is abnormal or we need to change your treatment, we will call you to review the results.  Testing/Procedures: Your physician has requested that you have an echocardiogram. Echocardiography is a painless test that uses sound waves to create images of your heart. It provides your doctor with information about the size and shape of your heart and how well your heart's chambers and valves are working. This procedure takes approximately one hour. There are no restrictions for this procedure.  Your physician has requested that you have en exercise stress myoview. For further information please visit [email or web address][facility][email or web address]. Please follow instruction sheet, as given.  Follow-Up: At [facility], you and your health needs are our priority.  As part of our continuing mission to provide you with exceptional heart care, we have created designated Provider Care Teams.  These Care Teams include your primary Cardiologist (physician) and Advanced Practice Providers (APPs -  Physician Assistants and Nurse Practitioners) who all work together to provide you with the care you need, when you need it. You will need a follow up appointment in 1 years.  Please call our office  2 months in advance to schedule this appointment.  You may see Dr. Radford Pax or one of the following Advanced Practice Providers on your designated Care Team:   [place], PA-C Melina Copa, PA-C . Ermalinda Barrios, PA-C       Signed, Fransico Him, MD  03/07/2018 10:11 AM    [facility] [place], [place], [place]  [address] Phone: [phone]; Fax: [phone]

## 2018-03-08 ENCOUNTER — Ambulatory Visit (HOSPITAL_COMMUNITY): Payer: PPO | Attending: Cardiology

## 2018-03-08 ENCOUNTER — Other Ambulatory Visit: Payer: Self-pay

## 2018-03-08 ENCOUNTER — Encounter: Payer: Self-pay | Admitting: Cardiology

## 2018-03-08 ENCOUNTER — Telehealth: Payer: Self-pay

## 2018-03-08 DIAGNOSIS — I319 Disease of pericardium, unspecified: Secondary | ICD-10-CM

## 2018-03-08 DIAGNOSIS — R011 Cardiac murmur, unspecified: Secondary | ICD-10-CM | POA: Insufficient documentation

## 2018-03-08 DIAGNOSIS — I272 Pulmonary hypertension, unspecified: Secondary | ICD-10-CM | POA: Insufficient documentation

## 2018-03-08 DIAGNOSIS — R7982 Elevated C-reactive protein (CRP): Secondary | ICD-10-CM

## 2018-03-08 DIAGNOSIS — R079 Chest pain, unspecified: Secondary | ICD-10-CM

## 2018-03-08 DIAGNOSIS — I350 Nonrheumatic aortic (valve) stenosis: Secondary | ICD-10-CM | POA: Insufficient documentation

## 2018-03-08 DIAGNOSIS — R7989 Other specified abnormal findings of blood chemistry: Secondary | ICD-10-CM

## 2018-03-08 DIAGNOSIS — I2510 Atherosclerotic heart disease of native coronary artery without angina pectoris: Secondary | ICD-10-CM

## 2018-03-08 LAB — SEDIMENTATION RATE: Sed Rate: 6 mm/hr (ref 0–30)

## 2018-03-08 LAB — PRO B NATRIURETIC PEPTIDE: NT-Pro BNP: 371 pg/mL (ref 0–376)

## 2018-03-08 LAB — C-REACTIVE PROTEIN: CRP: 19 mg/L — ABNORMAL HIGH (ref 0–10)

## 2018-03-08 LAB — D-DIMER, QUANTITATIVE (NOT AT ARMC): D-DIMER: 0.85 mg{FEU}/L — AB (ref 0.00–0.49)

## 2018-03-08 MED ORDER — PERFLUTREN LIPID MICROSPHERE
1.0000 mL | INTRAVENOUS | Status: AC | PRN
  Administered 2018-03-08: 2 mL via INTRAVENOUS

## 2018-03-08 NOTE — Telephone Encounter (Signed)
Spoke with the patient, he expressed understanding about his test results. The patient wanted to know what test needed to be done.   Called Dr. Radford Pax, she stated that the patient needs Chest CTA PE and Cardiac MRI.  The patient expressed understanding. Sending message to [facility] to schedule.

## 2018-03-08 NOTE — Telephone Encounter (Signed)
-----   Message from Sueanne Margarita, MD sent at 03/08/2018  2:45 PM EDT ----- Please let patient know that echo showed normal LVF with increased stiffness of heart muscle normal for his age.  The aortic valve is calcified with mild AS, mildly dilated aorta and mildly leaky MV and PV.  There is mildly elevated pressure in the arteries of the lungs. Chest CT pending to rule out PE.  Please get PFTs with DLCO.  If normal then will need sleep study.  Repeat echo in 1 year for pulmonary HTN and AS.

## 2018-03-08 NOTE — Telephone Encounter (Signed)
Notes recorded by Sueanne Margarita, MD on 03/08/2018 at 7:56 AM EDT Ddimer is elevated - please get chest CTA to rule out PE ------  Notes recorded by Sueanne Margarita, MD on 03/07/2018 at 6:20 PM EDT CRP is elevated - please get cardiac MRI to assess for pericarditis

## 2018-03-09 DIAGNOSIS — G4733 Obstructive sleep apnea (adult) (pediatric): Secondary | ICD-10-CM | POA: Diagnosis not present

## 2018-03-09 DIAGNOSIS — E7800 Pure hypercholesterolemia, unspecified: Secondary | ICD-10-CM | POA: Diagnosis not present

## 2018-03-09 DIAGNOSIS — Z1389 Encounter for screening for other disorder: Secondary | ICD-10-CM | POA: Diagnosis not present

## 2018-03-09 DIAGNOSIS — Z0000 Encounter for general adult medical examination without abnormal findings: Secondary | ICD-10-CM | POA: Diagnosis not present

## 2018-03-09 DIAGNOSIS — Z79899 Other long term (current) drug therapy: Secondary | ICD-10-CM | POA: Diagnosis not present

## 2018-03-09 DIAGNOSIS — I319 Disease of pericardium, unspecified: Secondary | ICD-10-CM | POA: Diagnosis not present

## 2018-03-12 DIAGNOSIS — E7800 Pure hypercholesterolemia, unspecified: Secondary | ICD-10-CM | POA: Diagnosis not present

## 2018-03-12 DIAGNOSIS — Z79899 Other long term (current) drug therapy: Secondary | ICD-10-CM | POA: Diagnosis not present

## 2018-03-13 ENCOUNTER — Ambulatory Visit (INDEPENDENT_AMBULATORY_CARE_PROVIDER_SITE_OTHER)
Admission: RE | Admit: 2018-03-13 | Discharge: 2018-03-13 | Disposition: A | Discharge status: Home / Self Care | Payer: PPO | Source: Ambulatory Visit | Attending: Cardiology | Admitting: Cardiology

## 2018-03-13 ENCOUNTER — Telehealth: Payer: Self-pay

## 2018-03-13 ENCOUNTER — Encounter: Payer: Self-pay | Admitting: Cardiology

## 2018-03-13 DIAGNOSIS — R7989 Other specified abnormal findings of blood chemistry: Secondary | ICD-10-CM | POA: Diagnosis not present

## 2018-03-13 DIAGNOSIS — I712 Thoracic aortic aneurysm, without rupture: Secondary | ICD-10-CM

## 2018-03-13 DIAGNOSIS — I7121 Aneurysm of the ascending aorta, without rupture: Secondary | ICD-10-CM

## 2018-03-13 MED ORDER — IOPAMIDOL (ISOVUE-370) INJECTION 76%
80.0000 mL | Freq: Once | INTRAVENOUS | Status: AC | PRN
  Administered 2018-03-13: 80 mL via INTRAVENOUS

## 2018-03-13 NOTE — Telephone Encounter (Signed)
Spoke with the patient, he understood the test result. Repeat CTA ordered for 1 year. The patient during his test told CT department that he had lab work done at PCP. They call and had the information faxed over. Waiting for FLP in [place].

## 2018-03-13 NOTE — Telephone Encounter (Signed)
-----   Message from Sueanne Margarita, MD sent at 03/13/2018 11:58 AM EST ----- Please let patient know that chest CT showed no evidence of PE.  He does have a 4.4 cm ascending thoracic aortic aneurysm which measures larger noted on echo (4.1 cm).  Recommend repeat chest CT angio in 1 year to make sure this has not increased in size.  Please have patient come in for a fasting lipid panel.

## 2018-03-14 ENCOUNTER — Telehealth (HOSPITAL_COMMUNITY): Payer: Self-pay | Admitting: *Deleted

## 2018-03-14 NOTE — Telephone Encounter (Signed)
Fax received. Sent to Dr. Radford Pax.

## 2018-03-14 NOTE — Telephone Encounter (Signed)
Left message on voicemail per DPR in reference to upcoming appointment scheduled on 03/19/18  with detailed instructions given per Myocardial Perfusion Study Information Sheet for the test. LM to arrive 15 minutes early, and that it is imperative to arrive on time for appointment to keep from having the test rescheduled. If you need to cancel or reschedule your appointment, please call the office within 24 hours of your appointment. Failure to do so may result in a cancellation of your appointment, and a $50 no show fee. Phone number given for call back for any questions. Kirstie Peri

## 2018-03-19 ENCOUNTER — Encounter: Payer: Self-pay | Admitting: Cardiovascular Disease

## 2018-03-19 ENCOUNTER — Ambulatory Visit (HOSPITAL_COMMUNITY): Payer: PPO | Attending: Cardiology

## 2018-03-19 ENCOUNTER — Other Ambulatory Visit: Payer: Self-pay | Admitting: Nurse Practitioner

## 2018-03-19 ENCOUNTER — Telehealth: Payer: Self-pay | Admitting: Cardiovascular Disease

## 2018-03-19 DIAGNOSIS — R079 Chest pain, unspecified: Secondary | ICD-10-CM | POA: Diagnosis not present

## 2018-03-19 MED ORDER — NITROGLYCERIN 0.4 MG SL SUBL: 0.4000 mg | SUBLINGUAL_TABLET | SUBLINGUAL | 3 refills | Status: DC | PRN

## 2018-03-19 MED ORDER — TECHNETIUM TC 99M TETROFOSMIN IV KIT
32.7000 | PACK | Freq: Once | INTRAVENOUS | Status: AC | PRN
  Administered 2018-03-19: 32.7 via INTRAVENOUS
  Filled 2018-03-19: qty 33

## 2018-03-19 MED ORDER — NITROGLYCERIN 0.4 MG SL SUBL: 0.4000 mg | SUBLINGUAL_TABLET | SUBLINGUAL | 3 refills | Status: AC | PRN

## 2018-03-19 NOTE — Telephone Encounter (Signed)
    Phone call today with Mr. Clarence Andrade.  The nuclear department brought me Mr. Clarence Andrade nuclear study today as DOD ( after the patient had left our dept)  .  He has some T wave inversions in the inferior leads during the recovery phase.  The nuclear stress test is a 2-day study so we do not have a complete images but he does have a defect in the anterior apical walls and apex.  This area appears to contract fairly normally.  We do not have the second set of images.   The patient is completely pain-free at this time. His only had exertional angina up till now.  His exertional symptoms have been gradually increasing over the last several weeks.  I made sure that he is on aspirin 81 mg a day.  I called in a prescription nitroglycerin to be taken sublingually as needed.  I have talked to them personally on the phone and have instructed him to call EMS right away if he has any symptoms that occur without exertion.    He will will return for his resting images tomorrow.  I discussed the case with Dr. Radford Pax and she will call him tomorrow after his resting images are back.      Mertie Moores, MD  03/19/2018 3:40 PM    [facility] [place],  [place], [place]  [address] Pager [phone] Phone: [phone]; Fax: [phone]

## 2018-03-19 NOTE — Progress Notes (Signed)
n

## 2018-03-20 ENCOUNTER — Ambulatory Visit (HOSPITAL_COMMUNITY): Payer: PPO | Attending: Cardiovascular Disease

## 2018-03-20 ENCOUNTER — Encounter: Payer: Self-pay | Admitting: Cardiovascular Disease

## 2018-03-20 ENCOUNTER — Ambulatory Visit (INDEPENDENT_AMBULATORY_CARE_PROVIDER_SITE_OTHER): Payer: PPO | Admitting: Cardiovascular Disease

## 2018-03-20 VITALS — BP 138/78 | HR 60 | Ht 72.0 in | Wt 293.0 lb

## 2018-03-20 DIAGNOSIS — I200 Unstable angina: Secondary | ICD-10-CM

## 2018-03-20 LAB — MYOCARDIAL PERFUSION IMAGING
CHL CUP MPHR: 148 {beats}/min
CHL CUP NUCLEAR SRS: 0
CHL CUP NUCLEAR SSS: 17
CHL RATE OF PERCEIVED EXERTION: 19
CSEPED: 4 min
CSEPEW: 5.7 METS
LV dias vol: 157 mL (ref 62–150)
LVSYSVOL: 83 mL
Peak HR: 141 {beats}/min
Percent HR: 95 %
Rest HR: 58 {beats}/min
SDS: 17
TID: 1.39

## 2018-03-20 MED ORDER — TECHNETIUM TC 99M TETROFOSMIN IV KIT
30.6000 | PACK | Freq: Once | INTRAVENOUS | Status: DC | PRN
  Filled 2018-03-20: qty 31

## 2018-03-20 MED ORDER — ROSUVASTATIN CALCIUM 20 MG PO TABS: 20.0000 mg | ORAL_TABLET | Freq: Every day | ORAL | 3 refills | Status: DC

## 2018-03-20 NOTE — H&P (View-Only) (Signed)
Cardiology Office Note:    Date:  03/20/2018   ID:  [name], DOB 03/11/1946, MRN [id number]  PCP:  Lajean Manes, MD  Cardiologist:  Fransico Him, MD  Electrophysiologist:  None   Referring MD: Lajean Manes, MD   Chief Complaint  Patient presents with  . Chest Pain         Clarence Andrade is a 72 y.o. male with a hx of coronary artery calcifications and history of pericarditis. Is seen today as a work in visit DOD visit.  Clarence Andrade is been having exertional chest pain for the past 10-11 months .   Marland Kitchen   Has worsened this past several months .  Now has chest pressure / constriction walking to his mail box Has not had it rest .   Resolves with rest after several minutes.    Past Medical History:  Diagnosis Date  . Abdominal hernia    Midline  . Adrenal incidentaloma ([place]) 2010  . Aortic stenosis    mild by echo 02/2018  . OSA (obstructive sleep apnea)    Severe PSG 1/07 AHI 66/hr, O2 Nadir 50% Refuses treatment  . Pericarditis    now on colchicine  . Pulmonary HTN ([place])    mild with PASP 74mmHg by echo 02/2018  . Screening for AAA (abdominal aortic aneurysm) 2010   CT Abd/Pelvis  . Ventral hernia    03/2009    Past Surgical History:  Procedure Laterality Date  . HERNIA REPAIR  1/04  . KNEE ARTHROSCOPY Left 05/04  . TOTAL KNEE ARTHROPLASTY Bilateral 05/12  . VENTRAL HERNIA REPAIR  03/2009    Current Medications: Current Meds  Medication Sig  . ASPIRIN 81 PO Take 1 tablet by mouth daily.  . colchicine 0.6 MG tablet Take 0.6 mg by mouth daily.  . Multiple Vitamin (MULTIVITAMIN) tablet Take 1 tablet by mouth daily.  Marland Kitchen NAPROXEN PO Take 1 tablet by mouth as needed.   . nitroGLYCERIN (NITROSTAT) 0.4 MG SL tablet Place 1 tablet (0.4 mg total) under the tongue every 5 (five) minutes as needed for chest pain.     Allergies:   Sertraline hcl   Social History   Socioeconomic History  . Marital status: Married    Spouse name: Not on file  . Number of  children: Not on file  . Years of education: Not on file  . Highest education level: Not on file  Occupational History  . Not on file  Social Needs  . Financial resource strain: Not on file  . Food insecurity:    Worry: Not on file    Inability: Not on file  . Transportation needs:    Medical: Not on file    Non-medical: Not on file  Tobacco Use  . Smoking status: Former Research scientist (life sciences)  . Smokeless tobacco: Never Used  Substance and Sexual Activity  . Alcohol use: No  . Drug use: No  . Sexual activity: Not on file  Lifestyle  . Physical activity:    Days per week: Not on file    Minutes per session: Not on file  . Stress: Not on file  Relationships  . Social connections:    Talks on phone: Not on file    Gets together: Not on file    Attends religious service: Not on file    Active member of club or organization: Not on file    Attends meetings of clubs or organizations: Not on file    Relationship status: Not  on file  Other Topics Concern  . Not on file  Social History Narrative  . Not on file     Family History: The patient's family history includes Aneurysm in his father; Heart attack in his father; Heart disease in his father.  ROS:   Please see the history of present illness.     All other systems reviewed and are negative.  EKGs/Labs/Other Studies Reviewed:    The following studies were reviewed today:   EKG:   From 11, 2019 ( as part of the GXT myoview study ) sinus bradycardia.  T wave inversions in lead I and aVL.  Acute ST or T wave changes.  Recent Labs: 03/07/2018: NT-Pro BNP 371  Recent Lipid Panel    Component Value Date/Time   CHOL  06/05/2010 0715    138        ATP III CLASSIFICATION:  <200     mg/dL   Desirable  200-239  mg/dL   Borderline High  >=240    mg/dL   High          TRIG 51 06/05/2010 0715   HDL 43 06/05/2010 0715   CHOLHDL 3.2 06/05/2010 0715   VLDL 10 06/05/2010 0715   LDLCALC  06/05/2010 0715    85        Total  Cholesterol/HDL:CHD Risk Coronary Heart Disease Risk Table                     Men   Women  1/2 Average Risk   3.4   3.3  Average Risk       5.0   4.4  2 X Average Risk   9.6   7.1  3 X Average Risk  23.4   11.0        Use the calculated Patient Ratio above and the CHD Risk Table to determine the patient's CHD Risk.        ATP III CLASSIFICATION (LDL):  <100     mg/dL   Optimal  100-129  mg/dL   Near or Above                    Optimal  130-159  mg/dL   Borderline  160-189  mg/dL   High  >190     mg/dL   Very High    Physical Exam:    VS:  BP 138/78   Pulse 60   Ht 6' (1.829 m)   Wt 293 lb (132.9 kg)   SpO2 98%   BMI 39.74 kg/m     Wt Readings from Last 3 Encounters:  03/20/18 293 lb (132.9 kg)  03/19/18 298 lb (135.2 kg)  03/07/18 298 lb (135.2 kg)     GEN:   Elderly gentleman, moderately obese acute distress HEENT: Normal NECK: No JVD; No carotid bruits LYMPHATICS: No lymphadenopathy CARDIAC: Regular rate I3-B0.,  Soft systolic murmur  RESPIRATORY:  Clear to auscultation without rales, wheezing or rhonchi  ABDOMEN: Soft, non-tender, non-distended. Moderately obese  MUSCULOSKELETAL:  No edema; No deformity , good radial pulses SKIN: Warm and dry NEUROLOGIC:  Alert and oriented x 3 PSYCHIATRIC:  Normal affect   ASSESSMENT:    1. Unstable angina (HCC)    PLAN:    In order of problems listed above:  1. Unstable angina: The patient presents with symptoms consistent with unstable angina.  He has had intermittent episodes of chest discomfort for the past 10 months.  These pains of increasingly  worsened and accelerated over the past 2 months.  He now has chest tightness walking to and from the mailbox. He had a Myoview stress test today which revealed an moderate to large reversible  anterior apical defect.  We have scheduled him for heart catheterization on Friday.  We discussed the risks, benefits, options of heart catheterization.  He understands and agrees to  proceed. I have advised him to do no exercise at all and to take it easy until his heart catheterization on Friday.  He seems to understand.  2.  Hyperlipidemia: His LDL was 122.  He has evidence of coronary artery calcifications and now has an abnormal Myoview study.  His goal LDL is 70. Heart him on rosuvastatin 20 mg a day.   Medication Adjustments/Labs and Tests Ordered: Current medicines are reviewed at length with the patient today.  Concerns regarding medicines are outlined above.  Orders Placed This Encounter  Procedures  . Basic Metabolic Panel (BMET)  . CBC   Meds ordered this encounter  Medications  . rosuvastatin (CRESTOR) 20 MG tablet    Sig: Take 1 tablet (20 mg total) by mouth daily.    Dispense:  90 tablet    Refill:  3    Patient Instructions  Medication Instructions:  Your physician has recommended you make the following change in your medication:   START Rosuvastatin (Crestor) 20 mg once daily   If you need a refill on your cardiac medications before your next appointment, please call your pharmacy.   Lab work: TODAY - CBC, BMET  If you have labs (blood work) drawn today and your tests are completely normal, you will receive your results only by: Marland Kitchen MyChart Message (if you have MyChart) OR . A paper copy in the mail If you have any lab test that is abnormal or we need to change your treatment, we will call you to review the results.  Testing/Procedures:   You are scheduled for a Cardiac Catheterization on Friday, November 8 with Dr. Harrell Gave End.  1. Please arrive at the [facility] (Main Entrance A) at [facility]: [place], [place] [address] at 5:30 AM (This time is two hours before your procedure to ensure your preparation). Free valet parking service is available.   Special note: Every effort is made to have your procedure done on time. Please understand that emergencies sometimes delay scheduled procedures.  2. Diet: Do  not eat solid foods after midnight.  The patient may have clear liquids until 5am upon the day of the procedure.  3. Labs: You will need to have blood drawn on Tuesday, November 12 at [facility]. [address]  Open: 7:30am - 5pm    Phone: [phone]. You do not need to be fasting.  4. Medication instructions in preparation for your procedure:   Contrast Allergy: No  On the morning of your procedure, take your Aspirin and any morning medicines NOT listed above.  You may use sips of water.  5. Plan for one night stay--bring personal belongings. 6. Bring a current list of your medications and current insurance cards. 7. You MUST have a responsible person to drive you home. 8. Someone MUST be with you the first 24 hours after you arrive home or your discharge will be delayed. 9. Please wear clothes that are easy to get on and off and wear slip-on shoes.  Thank you for allowing [place] to care for you!   --  [facility]   Follow-Up: At [facility], you and your health needs are our priority.  As part of our continuing mission to provide you with exceptional heart care, we have created designated Provider Care Teams.  These Care Teams include your primary Cardiologist (physician) and Advanced Practice Providers (APPs -  Physician Assistants and Nurse Practitioners) who all work together to provide you with the care you need, when you need it. You will need a follow up appointment in 4-6 weeks.  Please call our office 2 months in advance to schedule this appointment.  You may see Fransico Him, MD or one of the following Advanced Practice Providers on your designated Care Team:   [place], PA-C Melina Copa, PA-C . Ermalinda Barrios, PA-C       Signed, Mertie Moores, MD  03/20/2018 2:35 PM    [facility]

## 2018-03-20 NOTE — Patient Instructions (Addendum)
Medication Instructions:  Your physician has recommended you make the following change in your medication:   START Rosuvastatin (Crestor) 20 mg once daily   If you need a refill on your cardiac medications before your next appointment, please call your pharmacy.   Lab work: TODAY - CBC, BMET  If you have labs (blood work) drawn today and your tests are completely normal, you will receive your results only by: Marland Kitchen MyChart Message (if you have MyChart) OR . A paper copy in the mail If you have any lab test that is abnormal or we need to change your treatment, we will call you to review the results.  Testing/Procedures:   You are scheduled for a Cardiac Catheterization on Friday, November 8 with Dr. Harrell Gave End.  1. Please arrive at the [facility] (Main Entrance A) at [facility]: [place], [place] [address] at 5:30 AM (This time is two hours before your procedure to ensure your preparation). Free valet parking service is available.   Special note: Every effort is made to have your procedure done on time. Please understand that emergencies sometimes delay scheduled procedures.  2. Diet: Do not eat solid foods after midnight.  The patient may have clear liquids until 5am upon the day of the procedure.  3. Labs: You will need to have blood drawn on Tuesday, November 12 at [facility] at [facility]. [address]  Open: 7:30am - 5pm    Phone: [phone]. You do not need to be fasting.  4. Medication instructions in preparation for your procedure:   Contrast Allergy: No  On the morning of your procedure, take your Aspirin and any morning medicines NOT listed above.  You may use sips of water.  5. Plan for one night stay--bring personal belongings. 6. Bring a current list of your medications and current insurance cards. 7. You MUST have a responsible person to drive you home. 8. Someone MUST be with you the first 24 hours after  you arrive home or your discharge will be delayed. 9. Please wear clothes that are easy to get on and off and wear slip-on shoes.  Thank you for allowing [place] to care for you!   -- [place] [facility]   Follow-Up: At [facility], you and your health needs are our priority.  As part of our continuing mission to provide you with exceptional heart care, we have created designated Provider Care Teams.  These Care Teams include your primary Cardiologist (physician) and Advanced Practice Providers (APPs -  Physician Assistants and Nurse Practitioners) who all work together to provide you with the care you need, when you need it. You will need a follow up appointment in 4-6 weeks.  Please call our office 2 months in advance to schedule this appointment.  You may see Fransico Him, MD or one of the following Advanced Practice Providers on your designated Care Team:   [place], PA-C Melina Copa, PA-C . Ermalinda Barrios, PA-C

## 2018-03-20 NOTE — Progress Notes (Signed)
Cardiology Office Note:    Date:  03/20/2018   ID:  Clarence Andrade, DOB [birth year], MRN [id number]  PCP:  Lajean Manes, MD  Cardiologist:  Fransico Him, MD  Electrophysiologist:  None   Referring MD: Lajean Manes, MD   Chief Complaint  Patient presents with  . Chest Pain         Clarence Andrade is a 72 y.o. male with a hx of coronary artery calcifications and history of pericarditis. Is seen today as a work in visit DOD visit.  Clarence Andrade is been having exertional chest pain for the past 10-11 months .   Marland Kitchen   Has worsened this past several months .  Now has chest pressure / constriction walking to his mail box Has not had it rest .   Resolves with rest after several minutes.    Past Medical History:  Diagnosis Date  . Abdominal hernia    Midline  . Adrenal incidentaloma ([place]) 2010  . Aortic stenosis    mild by echo 02/2018  . OSA (obstructive sleep apnea)    Severe PSG 1/07 AHI 66/hr, O2 Nadir 50% Refuses treatment  . Pericarditis    now on colchicine  . Pulmonary HTN ([place])    mild with PASP 40mmHg by echo 02/2018  . Screening for AAA (abdominal aortic aneurysm) 2010   CT Abd/Pelvis  . Ventral hernia    03/2009    Past Surgical History:  Procedure Laterality Date  . HERNIA REPAIR  1/04  . KNEE ARTHROSCOPY Left 05/04  . TOTAL KNEE ARTHROPLASTY Bilateral 05/12  . VENTRAL HERNIA REPAIR  03/2009    Current Medications: Current Meds  Medication Sig  . ASPIRIN 81 PO Take 1 tablet by mouth daily.  . colchicine 0.6 MG tablet Take 0.6 mg by mouth daily.  . Multiple Vitamin (MULTIVITAMIN) tablet Take 1 tablet by mouth daily.  Marland Kitchen NAPROXEN PO Take 1 tablet by mouth as needed.   . nitroGLYCERIN (NITROSTAT) 0.4 MG SL tablet Place 1 tablet (0.4 mg total) under the tongue every 5 (five) minutes as needed for chest pain.     Allergies:   Sertraline hcl   Social History   Socioeconomic History  . Marital status: Married    Spouse name: Not on file  . Number of  children: Not on file  . Years of education: Not on file  . Highest education level: Not on file  Occupational History  . Not on file  Social Needs  . Financial resource strain: Not on file  . Food insecurity:    Worry: Not on file    Inability: Not on file  . Transportation needs:    Medical: Not on file    Non-medical: Not on file  Tobacco Use  . Smoking status: Former Research scientist (life sciences)  . Smokeless tobacco: Never Used  Substance and Sexual Activity  . Alcohol use: No  . Drug use: No  . Sexual activity: Not on file  Lifestyle  . Physical activity:    Days per week: Not on file    Minutes per session: Not on file  . Stress: Not on file  Relationships  . Social connections:    Talks on phone: Not on file    Gets together: Not on file    Attends religious service: Not on file    Active member of club or organization: Not on file    Attends meetings of clubs or organizations: Not on file    Relationship status: Not  on file  Other Topics Concern  . Not on file  Social History Narrative  . Not on file     Family History: The patient's family history includes Aneurysm in his father; Heart attack in his father; Heart disease in his father.  ROS:   Please see the history of present illness.     All other systems reviewed and are negative.  EKGs/Labs/Other Studies Reviewed:    The following studies were reviewed today:   EKG:   From 11, 2019 ( as part of the GXT myoview study ) sinus bradycardia.  T wave inversions in lead I and aVL.  Acute ST or T wave changes.  Recent Labs: 03/07/2018: NT-Pro BNP 371  Recent Lipid Panel    Component Value Date/Time   CHOL  06/05/2010 0715    138        ATP III CLASSIFICATION:  <200     mg/dL   Desirable  200-239  mg/dL   Borderline High  >=240    mg/dL   High          TRIG 51 06/05/2010 0715   HDL 43 06/05/2010 0715   CHOLHDL 3.2 06/05/2010 0715   VLDL 10 06/05/2010 0715   LDLCALC  06/05/2010 0715    85        Total  Cholesterol/HDL:CHD Risk Coronary Heart Disease Risk Table                     Men   Women  1/2 Average Risk   3.4   3.3  Average Risk       5.0   4.4  2 X Average Risk   9.6   7.1  3 X Average Risk  23.4   11.0        Use the calculated Patient Ratio above and the CHD Risk Table to determine the patient's CHD Risk.        ATP III CLASSIFICATION (LDL):  <100     mg/dL   Optimal  100-129  mg/dL   Near or Above                    Optimal  130-159  mg/dL   Borderline  160-189  mg/dL   High  >190     mg/dL   Very High    Physical Exam:    VS:  BP 138/78   Pulse 60   Ht 6' (1.829 m)   Wt 293 lb (132.9 kg)   SpO2 98%   BMI 39.74 kg/m     Wt Readings from Last 3 Encounters:  03/20/18 293 lb (132.9 kg)  03/19/18 298 lb (135.2 kg)  03/07/18 298 lb (135.2 kg)     GEN:   Elderly gentleman, moderately obese acute distress HEENT: Normal NECK: No JVD; No carotid bruits LYMPHATICS: No lymphadenopathy CARDIAC: Regular rate U4-Q0.,  Soft systolic murmur  RESPIRATORY:  Clear to auscultation without rales, wheezing or rhonchi  ABDOMEN: Soft, non-tender, non-distended. Moderately obese  MUSCULOSKELETAL:  No edema; No deformity , good radial pulses SKIN: Warm and dry NEUROLOGIC:  Alert and oriented x 3 PSYCHIATRIC:  Normal affect   ASSESSMENT:    1. Unstable angina (HCC)    PLAN:    In order of problems listed above:  1. Unstable angina: The patient presents with symptoms consistent with unstable angina.  He has had intermittent episodes of chest discomfort for the past 10 months.  These pains of increasingly  worsened and accelerated over the past 2 months.  He now has chest tightness walking to and from the mailbox. He had a Myoview stress test today which revealed an moderate to large reversible  anterior apical defect.  We have scheduled him for heart catheterization on Friday.  We discussed the risks, benefits, options of heart catheterization.  He understands and agrees to  proceed. I have advised him to do no exercise at all and to take it easy until his heart catheterization on Friday.  He seems to understand.  2.  Hyperlipidemia: His LDL was 122.  He has evidence of coronary artery calcifications and now has an abnormal Myoview study.  His goal LDL is 70. Heart him on rosuvastatin 20 mg a day.   Medication Adjustments/Labs and Tests Ordered: Current medicines are reviewed at length with the patient today.  Concerns regarding medicines are outlined above.  Orders Placed This Encounter  Procedures  . Basic Metabolic Panel (BMET)  . CBC   Meds ordered this encounter  Medications  . rosuvastatin (CRESTOR) 20 MG tablet    Sig: Take 1 tablet (20 mg total) by mouth daily.    Dispense:  90 tablet    Refill:  3    Patient Instructions  Medication Instructions:  Your physician has recommended you make the following change in your medication:   START Rosuvastatin (Crestor) 20 mg once daily   If you need a refill on your cardiac medications before your next appointment, please call your pharmacy.   Lab work: TODAY - CBC, BMET  If you have labs (blood work) drawn today and your tests are completely normal, you will receive your results only by: Marland Kitchen MyChart Message (if you have MyChart) OR . A paper copy in the mail If you have any lab test that is abnormal or we need to change your treatment, we will call you to review the results.  Testing/Procedures:   You are scheduled for a Cardiac Catheterization on Friday, November 8 with Dr. Harrell Gave End.  1. Please arrive at the [facility] (Main Entrance A) at [facility]: [place], [place] [address] at 5:30 AM (This time is two hours before your procedure to ensure your preparation). Free valet parking service is available.   Special note: Every effort is made to have your procedure done on time. Please understand that emergencies sometimes delay scheduled procedures.  2. Diet: Do  not eat solid foods after midnight.  The patient may have clear liquids until 5am upon the day of the procedure.  3. Labs: You will need to have blood drawn on Tuesday, November 12 at [facility] at [facility]. [address]  Open: 7:30am - 5pm    Phone: [phone]. You do not need to be fasting.  4. Medication instructions in preparation for your procedure:   Contrast Allergy: No  On the morning of your procedure, take your Aspirin and any morning medicines NOT listed above.  You may use sips of water.  5. Plan for one night stay--bring personal belongings. 6. Bring a current list of your medications and current insurance cards. 7. You MUST have a responsible person to drive you home. 8. Someone MUST be with you the first 24 hours after you arrive home or your discharge will be delayed. 9. Please wear clothes that are easy to get on and off and wear slip-on shoes.  Thank you for allowing [place] to care for you!   --  [place] [facility]   Follow-Up: At [facility], you and your health needs are our priority.  As part of our continuing mission to provide you with exceptional heart care, we have created designated Provider Care Teams.  These Care Teams include your primary Cardiologist (physician) and Advanced Practice Providers (APPs -  Physician Assistants and Nurse Practitioners) who all work together to provide you with the care you need, when you need it. You will need a follow up appointment in 4-6 weeks.  Please call our office 2 months in advance to schedule this appointment.  You may see Fransico Him, MD or one of the following Advanced Practice Providers on your designated Care Team:   [place], PA-C Melina Copa, PA-C . Ermalinda Barrios, PA-C       Signed, Mertie Moores, MD  03/20/2018 2:35 PM    [facility]

## 2018-03-21 LAB — BASIC METABOLIC PANEL
BUN / CREAT RATIO: 20 (ref 10–24)
BUN: 17 mg/dL (ref 8–27)
CO2: 27 mmol/L (ref 20–29)
Calcium: 9.3 mg/dL (ref 8.6–10.2)
Chloride: 102 mmol/L (ref 96–106)
Creatinine, Ser: 0.86 mg/dL (ref 0.76–1.27)
GFR calc non Af Amer: 87 mL/min/{1.73_m2} (ref >59)
GFR, EST AFRICAN AMERICAN: 100 mL/min/{1.73_m2} (ref >59)
GLUCOSE: 84 mg/dL (ref 65–99)
Potassium: 4.6 mmol/L (ref 3.5–5.2)
Sodium: 145 mmol/L — ABNORMAL HIGH (ref 134–144)

## 2018-03-21 LAB — CBC
HEMATOCRIT: 47.5 % (ref 37.5–51.0)
Hemoglobin: 16.7 g/dL (ref 13.0–17.7)
MCH: 31.5 pg (ref 26.6–33.0)
MCHC: 35.2 g/dL (ref 31.5–35.7)
MCV: 90 fL (ref 79–97)
Platelets: 231 10*3/uL (ref 150–450)
RBC: 5.31 x10E6/uL (ref 4.14–5.80)
RDW: 12.5 % (ref 12.3–15.4)
WBC: 7.2 10*3/uL (ref 3.4–10.8)

## 2018-03-22 ENCOUNTER — Telehealth: Payer: Self-pay | Admitting: *Deleted

## 2018-03-22 NOTE — Telephone Encounter (Signed)
Pt contacted pre-catheterization scheduled at [facility] for: Friday March 23, 2018 7:30 AM Verified arrival time and place: [facility] En[facility] A at: 5:30 AM  No solid food after midnight prior to cath, clear liquids until 5 AM day of procedure. Contrast allergy: no Verified no diabetes medications.  AM meds can be  taken pre-cath with sip of water including: ASA 81 mg  Confirmed patient has responsible person to drive home post procedure and for 24 hours after you arrive home: yes

## 2018-03-23 ENCOUNTER — Ambulatory Visit (HOSPITAL_COMMUNITY)
Admission: RE | Admit: 2018-03-23 | Discharge: 2018-03-23 | Disposition: A | Discharge status: Home / Self Care | Payer: PPO | Source: Ambulatory Visit | Attending: Internal Medicine | Admitting: Internal Medicine

## 2018-03-23 ENCOUNTER — Encounter (HOSPITAL_COMMUNITY): Payer: Self-pay | Admitting: Internal Medicine

## 2018-03-23 ENCOUNTER — Encounter (HOSPITAL_COMMUNITY)
Admission: RE | Disposition: A | Discharge status: Home / Self Care | Payer: Self-pay | Source: Ambulatory Visit | Attending: Internal Medicine

## 2018-03-23 ENCOUNTER — Other Ambulatory Visit: Payer: Self-pay

## 2018-03-23 DIAGNOSIS — E785 Hyperlipidemia, unspecified: Secondary | ICD-10-CM | POA: Diagnosis not present

## 2018-03-23 DIAGNOSIS — I272 Pulmonary hypertension, unspecified: Secondary | ICD-10-CM | POA: Diagnosis not present

## 2018-03-23 DIAGNOSIS — Z8249 Family history of ischemic heart disease and other diseases of the circulatory system: Secondary | ICD-10-CM | POA: Insufficient documentation

## 2018-03-23 DIAGNOSIS — I208 Other forms of angina pectoris: Secondary | ICD-10-CM | POA: Diagnosis present

## 2018-03-23 DIAGNOSIS — I2582 Chronic total occlusion of coronary artery: Secondary | ICD-10-CM | POA: Diagnosis not present

## 2018-03-23 DIAGNOSIS — R9439 Abnormal result of other cardiovascular function study: Secondary | ICD-10-CM | POA: Diagnosis present

## 2018-03-23 DIAGNOSIS — I2584 Coronary atherosclerosis due to calcified coronary lesion: Secondary | ICD-10-CM | POA: Insufficient documentation

## 2018-03-23 DIAGNOSIS — I2510 Atherosclerotic heart disease of native coronary artery without angina pectoris: Secondary | ICD-10-CM | POA: Diagnosis not present

## 2018-03-23 DIAGNOSIS — Z7982 Long term (current) use of aspirin: Secondary | ICD-10-CM | POA: Insufficient documentation

## 2018-03-23 DIAGNOSIS — Z87891 Personal history of nicotine dependence: Secondary | ICD-10-CM | POA: Diagnosis not present

## 2018-03-23 DIAGNOSIS — G4733 Obstructive sleep apnea (adult) (pediatric): Secondary | ICD-10-CM | POA: Diagnosis not present

## 2018-03-23 DIAGNOSIS — I25110 Atherosclerotic heart disease of native coronary artery with unstable angina pectoris: Secondary | ICD-10-CM | POA: Diagnosis not present

## 2018-03-23 DIAGNOSIS — I350 Nonrheumatic aortic (valve) stenosis: Secondary | ICD-10-CM | POA: Insufficient documentation

## 2018-03-23 DIAGNOSIS — I2089 Other forms of angina pectoris: Secondary | ICD-10-CM | POA: Diagnosis present

## 2018-03-23 HISTORY — PX: CORONARY ANGIOGRAPHY: CATH118303

## 2018-03-23 SURGERY — CORONARY ANGIOGRAPHY (CATH LAB)
Anesthesia: LOCAL

## 2018-03-23 MED ORDER — LIDOCAINE HCL (PF) 1 % IJ SOLN
INTRAMUSCULAR | Status: AC
  Filled 2018-03-23: qty 30

## 2018-03-23 MED ORDER — LIDOCAINE HCL (PF) 1 % IJ SOLN
INTRAMUSCULAR | Status: DC | PRN
  Administered 2018-03-23: 2 mL

## 2018-03-23 MED ORDER — SODIUM CHLORIDE 0.9% FLUSH: 3.0000 mL | INTRAVENOUS | Status: DC | PRN

## 2018-03-23 MED ORDER — IOHEXOL 350 MG/ML SOLN
INTRAVENOUS | Status: DC | PRN
  Administered 2018-03-23: 50 mL via INTRA_ARTERIAL

## 2018-03-23 MED ORDER — HEPARIN (PORCINE) IN NACL 1000-0.9 UT/500ML-% IV SOLN
INTRAVENOUS | Status: AC
  Filled 2018-03-23: qty 500

## 2018-03-23 MED ORDER — FENTANYL CITRATE (PF) 100 MCG/2ML IJ SOLN
INTRAMUSCULAR | Status: DC | PRN
  Administered 2018-03-23: 50 ug via INTRAVENOUS

## 2018-03-23 MED ORDER — ISOSORBIDE MONONITRATE ER 30 MG PO TB24: 30.0000 mg | ORAL_TABLET | Freq: Every day | ORAL | 5 refills | Status: DC

## 2018-03-23 MED ORDER — MIDAZOLAM HCL 2 MG/2ML IJ SOLN
INTRAMUSCULAR | Status: DC | PRN
  Administered 2018-03-23: 1 mg via INTRAVENOUS

## 2018-03-23 MED ORDER — HEPARIN (PORCINE) IN NACL 1000-0.9 UT/500ML-% IV SOLN
INTRAVENOUS | Status: DC | PRN
  Administered 2018-03-23 (×2): 500 mL

## 2018-03-23 MED ORDER — ONDANSETRON HCL 4 MG/2ML IJ SOLN: 4.0000 mg | Freq: Four times a day (QID) | INTRAMUSCULAR | Status: DC | PRN

## 2018-03-23 MED ORDER — VERAPAMIL HCL 2.5 MG/ML IV SOLN
INTRAVENOUS | Status: DC | PRN
  Administered 2018-03-23: 10 mL via INTRA_ARTERIAL

## 2018-03-23 MED ORDER — SODIUM CHLORIDE 0.9% FLUSH: 3.0000 mL | Freq: Two times a day (BID) | INTRAVENOUS | Status: DC

## 2018-03-23 MED ORDER — SODIUM CHLORIDE 0.9 % WEIGHT BASED INFUSION
3.0000 mL/kg/h | INTRAVENOUS | Status: AC
  Administered 2018-03-23: 3 mL/kg/h via INTRAVENOUS

## 2018-03-23 MED ORDER — ASPIRIN 81 MG PO CHEW: 81.0000 mg | CHEWABLE_TABLET | ORAL | Status: DC

## 2018-03-23 MED ORDER — SODIUM CHLORIDE 0.9 % IV SOLN: 250.0000 mL | INTRAVENOUS | Status: DC | PRN

## 2018-03-23 MED ORDER — ACETAMINOPHEN 325 MG PO TABS: 650.0000 mg | ORAL_TABLET | ORAL | Status: DC | PRN

## 2018-03-23 MED ORDER — VERAPAMIL HCL 2.5 MG/ML IV SOLN
INTRAVENOUS | Status: AC
  Filled 2018-03-23: qty 2

## 2018-03-23 MED ORDER — HEPARIN SODIUM (PORCINE) 1000 UNIT/ML IJ SOLN
INTRAMUSCULAR | Status: DC | PRN
  Administered 2018-03-23: 5000 [IU] via INTRAVENOUS

## 2018-03-23 MED ORDER — FENTANYL CITRATE (PF) 100 MCG/2ML IJ SOLN
INTRAMUSCULAR | Status: AC
  Filled 2018-03-23: qty 2

## 2018-03-23 MED ORDER — MIDAZOLAM HCL 2 MG/2ML IJ SOLN
INTRAMUSCULAR | Status: AC
  Filled 2018-03-23: qty 2

## 2018-03-23 MED ORDER — SODIUM CHLORIDE 0.9 % IV SOLN: INTRAVENOUS | Status: DC

## 2018-03-23 MED ORDER — SODIUM CHLORIDE 0.9 % WEIGHT BASED INFUSION: 1.0000 mL/kg/h | INTRAVENOUS | Status: DC

## 2018-03-23 SURGICAL SUPPLY — 12 items
CATH 5FR JL3.5 JR4 ANG PIG MP (CATHETERS) ×2 IMPLANT
CATH INFINITI 5FR AL1 (CATHETERS) ×2 IMPLANT
DEVICE RAD COMP TR BAND LRG (VASCULAR PRODUCTS) ×2 IMPLANT
GLIDESHEATH SLEND SS 6F .021 (SHEATH) ×2 IMPLANT
GUIDEWIRE INQWIRE 1.5J.035X260 (WIRE) ×1 IMPLANT
INQWIRE 1.5J .035X260CM (WIRE) ×2
KIT HEART LEFT (KITS) ×2 IMPLANT
PACK CARDIAC CATHETERIZATION (CUSTOM PROCEDURE TRAY) ×2 IMPLANT
TRANSDUCER W/STOPCOCK (MISCELLANEOUS) ×2 IMPLANT
TUBING CIL FLEX 10 FLL-RA (TUBING) ×2 IMPLANT
WIRE EMERALD ST .035X150CM (WIRE) ×2 IMPLANT
WIRE HI TORQ VERSACORE-J 145CM (WIRE) ×2 IMPLANT

## 2018-03-23 NOTE — Brief Op Note (Signed)
BRIEF CARDIAC CATHETERIZATION NOTE  DATE: 03/23/2018 TIME: 8:26 AM  PATIENT:  Clarence Andrade  72 y.o. male  PRE-OPERATIVE DIAGNOSIS:  Stable angina, abnormal stress test  POST-OPERATIVE DIAGNOSIS:  Same  PROCEDURE:  Procedure(s): LEFT HEART CATH AND CORONARY ANGIOGRAPHY (N/A)  SURGEON:  Surgeon(s) and Role:    * Lometa Riggin, MD - Primary  FINDINGS: 1. CTO proximal LAD with bridging collaterals, left-to-left collaterals, and right-to-left collaterals. 2. Moderate CAD involving proximal RCA and mid RCA. 3. Inability to perform left heart catheterization due to inadequate catheter length in the setting of subclavian artery tortuosity and TAA.  RECOMMENDATIONS: 1. Optimize medical therapy; will add isosorbide mononitrate 30 mg daily. 2. Aggressive secondary prevention. 3. If symptoms persist, consultation with CTO team should be considered to discuss PCI of proximal LAD occlusion.  Nelva Bush, MD [facility] Pager: [phone]

## 2018-03-23 NOTE — Interval H&P Note (Signed)
History and Physical Interval Note:  03/23/2018 7:21 AM  Clarence Andrade  has presented today for surgery, with the diagnosis of stable angina and abnormal stress test.  The various methods of treatment have been discussed with the patient and family. After consideration of risks, benefits and other options for treatment, the patient has consented to  Procedure(s): LEFT HEART CATH AND CORONARY ANGIOGRAPHY (N/A) as a surgical intervention .  The patient's history has been reviewed, patient examined, no change in status, stable for surgery.  I have reviewed the patient's chart and labs.  Questions were answered to the patient's satisfaction.    Cath Lab Visit (complete for each Cath Lab visit)  Clinical Evaluation Leading to the Procedure:   ACS: No.  Non-ACS:    Anginal Classification: CCS III  Anti-ischemic medical therapy: No Therapy  Non-Invasive Test Results: High-risk stress test findings: cardiac mortality >3%/year  Prior CABG: No previous CABG  Clarence Andrade

## 2018-03-23 NOTE — Discharge Instructions (Signed)

## 2018-03-28 ENCOUNTER — Other Ambulatory Visit (HOSPITAL_COMMUNITY): Payer: PPO

## 2018-04-09 ENCOUNTER — Encounter: Payer: Self-pay | Admitting: Physician Assistant

## 2018-04-09 ENCOUNTER — Ambulatory Visit (HOSPITAL_COMMUNITY)
Admission: RE | Admit: 2018-04-09 | Discharge: 2018-04-09 | Disposition: A | Discharge status: Home / Self Care | Payer: PPO | Source: Ambulatory Visit | Attending: Cardiology | Admitting: Cardiology

## 2018-04-09 DIAGNOSIS — R7982 Elevated C-reactive protein (CRP): Secondary | ICD-10-CM

## 2018-04-09 DIAGNOSIS — I517 Cardiomegaly: Secondary | ICD-10-CM

## 2018-04-09 DIAGNOSIS — I319 Disease of pericardium, unspecified: Secondary | ICD-10-CM | POA: Diagnosis not present

## 2018-04-09 DIAGNOSIS — I318 Other specified diseases of pericardium: Secondary | ICD-10-CM

## 2018-04-09 MED ORDER — GADOBUTROL 1 MMOL/ML IV SOLN
14.0000 mL | Freq: Once | INTRAVENOUS | Status: AC | PRN
  Administered 2018-04-09: 14 mL via INTRAVENOUS

## 2018-04-09 NOTE — Progress Notes (Signed)
Cardiology Office Note    Date:  04/10/2018  ID:  [name], DOB 07/26/1945, MRN [id number] PCP:  Lajean Manes, MD  Cardiologist:  Fransico Him, MD   Chief Complaint: f/u chest pain  History of Present Illness:  Clarence Andrade is a 72 y.o. male with history of CAD by recent cath, ascending aortic aneurysm, pericarditis, dyslipidemia, adrenal incidentaloma, OSA, mild pulmonary HTN, morbid obesity, ventral hernia who presents for post-cath f/u. He had remote diagnosis of pericarditis and a/so has prior h/o coronary calcifications in CT. More recently he underwent workup for exertional chest pain that had been present ever since 06/2017. CRP was elevated. 2D echo 03/08/18 showed EF 55-60%, grade 1 DD, mild AS, mildly dilated ascending aorta, mild MR/PR, mild pulm HTN. CT angio 03/13/18 ruled out PE but did show 4.4cm ascending thoracic aortic aneurysm. Nuclear stress testing was abnormal with positive EKG, angina with stress, and imaging showing distal anterior wall apex and septum ischemia, EF 47%. Cath was arranged showing CTO of the prox-mid LAD with bridging, L-L and R-L collaterals, moderate, non-obstructive disease involving the proximal LAD, proximal LCx, and distal RCA, more severe disease noted involving small branch vessels (RV marginal and superior branch of OM3). Cath notable for inability to cross the aortic valve for left heart catheterization due to insufficient catheter length complicated by tortuous subclavian artery and dilated ascending aorta.  Imdur was added, reserving CTO PCI for refractory angina with at least 2 antianginals. Cardiac MRI 04/09/18 done to exclude pericarditis showed normal EF 56% with mild mid-apical anteroseptal hypokinesis, mildly dilated RV with normal systolic function, <94% wall thickness subendocardial LGE in the mid-apical anteroseptal wall, consistent with prior infarction, no evidence for pericardial inflammation or pericardial effusion. Last labs  03/2018 showed Hgb 16.7, plt 231, Cr 0.86, LDL 122. Crestor was started at time of cath.  He returns for follow-up overall reporting significant improvement in chest discomfort. He reports it was remarkable what a difference the Imdur has made. He does still notice occasional chest discomfort in the morning with activity, but this improves as the day goes on. It seems as though morning mild activity provokes the chest discomfort whereas activity later in the day or more vigorously does not cause an issue. He was even able to participate in water aerobics yesterday without issue. He takes Imdur in the AM. He does notice an occasional headache but it is not lifestyle limiting. He has mild bendopnea but no orthopnea/PND/edema.   Past Medical History:  Diagnosis Date  . Abdominal hernia    Midline  . Adrenal incidentaloma ([place]) 2010  . Aortic stenosis    mild by echo 02/2018  . Ascending aortic aneurysm ([place])    a. 4.4cm by imaging 03/2018.  Marland Kitchen CAD (coronary artery disease)    a. Cath 03/2018 - CTO of the prox-mid LAD with bridging, L-L and R-L collaterals, moderate, non-obstructive disease involving the proximal LAD, proximal LCx, and distal RCA, more severe disease noted involving small branch vessels (RV marginal and superior branch of OM3).  . Hyperlipidemia   . OSA (obstructive sleep apnea)    Severe PSG 1/07 AHI 66/hr, O2 Nadir 50% Refuses treatment  . Pericarditis    now on colchicine  . Pulmonary HTN ([place])    mild with PASP 73mmHg by echo 02/2018  . Screening for AAA (abdominal aortic aneurysm) 2010   CT Abd/Pelvis  . Ventral hernia    03/2009    Past Surgical History:  Procedure Laterality  Date  . CORONARY ANGIOGRAPHY N/A 03/23/2018   Procedure: CORONARY ANGIOGRAPHY;  Surgeon: Nelva Bush, MD;  Location: [facility];  Service: Cardiovascular;  Laterality: N/A;  . HERNIA REPAIR  1/04  . KNEE ARTHROSCOPY Left 05/04  . TOTAL KNEE ARTHROPLASTY Bilateral 05/12  .  VENTRAL HERNIA REPAIR  03/2009    Current Medications: Current Meds  Medication Sig  . aspirin EC 81 MG tablet Take 81 mg by mouth daily.  . cetirizine (ZYRTEC) 10 MG tablet Take 10 mg by mouth daily as needed for allergies.  Marland Kitchen colchicine 0.6 MG tablet Take 0.6 mg by mouth 2 (two) times daily.   Marland Kitchen ibuprofen (ADVIL,MOTRIN) 200 MG tablet Take 400 mg by mouth every 6 (six) hours as needed for moderate pain.  . isosorbide mononitrate (IMDUR) 30 MG 24 hr tablet Take 1 tablet (30 mg total) by mouth daily.  . Multiple Vitamin (MULTIVITAMIN) tablet Take 1 tablet by mouth daily.  . naproxen sodium (ALEVE) 220 MG tablet Take 220 mg by mouth daily as needed (pain).  . nitroGLYCERIN (NITROSTAT) 0.4 MG SL tablet Place 1 tablet (0.4 mg total) under the tongue every 5 (five) minutes as needed for chest pain.  . rosuvastatin (CRESTOR) 20 MG tablet Take 1 tablet (20 mg total) by mouth daily.      Allergies:   Sertraline hcl   Social History   Socioeconomic History  . Marital status: Married    Spouse name: Not on file  . Number of children: Not on file  . Years of education: Not on file  . Highest education level: Not on file  Occupational History  . Not on file  Social Needs  . Financial resource strain: Not on file  . Food insecurity:    Worry: Not on file    Inability: Not on file  . Transportation needs:    Medical: Not on file    Non-medical: Not on file  Tobacco Use  . Smoking status: Former Research scientist (life sciences)  . Smokeless tobacco: Never Used  Substance and Sexual Activity  . Alcohol use: No  . Drug use: No  . Sexual activity: Not on file  Lifestyle  . Physical activity:    Days per week: Not on file    Minutes per session: Not on file  . Stress: Not on file  Relationships  . Social connections:    Talks on phone: Not on file    Gets together: Not on file    Attends religious service: Not on file    Active member of club or organization: Not on file    Attends meetings of clubs or  organizations: Not on file    Relationship status: Not on file  Other Topics Concern  . Not on file  Social History Narrative  . Not on file     Family History:  The patient's family history includes Aneurysm in his father; Heart attack in his father; Heart disease in his father.  ROS:   Please see the history of present illness.  All other systems are reviewed and otherwise negative.    PHYSICAL EXAM:   VS:  BP 126/80   Pulse 86   Ht 6' (1.829 m)   Wt 298 lb 12.8 oz (135.5 kg)   SpO2 93%   BMI 40.52 kg/m   BMI: Body mass index is 40.52 kg/m. GEN: Well nourished, well developed morbidly obese WM, in no acute distress HEENT: normocephalic, atraumatic Neck: no JVD, carotid bruits, or masses Cardiac:  RRR; soft murmur to RUSB, no rubs or gallops, no edema  Respiratory:  clear to auscultation bilaterally, normal work of breathing GI: soft, nontender, nondistended, + BS MS: no deformity or atrophy Skin: warm and dry, no rash. Right radial cath site without hematoma or ecchymosis; good pulse. Neuro:  Alert and Oriented x 3, Strength and sensation are intact, follows commands Psych: euthymic mood, full affect  Wt Readings from Last 3 Encounters:  04/10/18 298 lb 12.8 oz (135.5 kg)  03/23/18 300 lb (136.1 kg)  03/20/18 293 lb (132.9 kg)      Studies/Labs Reviewed:   EKG:   EKG was not ordered today  Recent Labs: 03/07/2018: NT-Pro BNP 371 03/20/2018: BUN 17; Creatinine, Ser 0.86; Hemoglobin 16.7; Platelets 231; Potassium 4.6; Sodium 145   Lipid Panel    Component Value Date/Time   CHOL  06/05/2010 0715    138        ATP III CLASSIFICATION:  <200     mg/dL   Desirable  200-239  mg/dL   Borderline High  >=240    mg/dL   High          TRIG 51 06/05/2010 0715   HDL 43 06/05/2010 0715   CHOLHDL 3.2 06/05/2010 0715   VLDL 10 06/05/2010 0715   LDLCALC  06/05/2010 0715    85        Total Cholesterol/HDL:CHD Risk Coronary Heart Disease Risk Table                      Men   Women  1/2 Average Risk   3.4   3.3  Average Risk       5.0   4.4  2 X Average Risk   9.6   7.1  3 X Average Risk  23.4   11.0        Use the calculated Patient Ratio above and the CHD Risk Table to determine the patient's CHD Risk.        ATP III CLASSIFICATION (LDL):  <100     mg/dL   Optimal  100-129  mg/dL   Near or Above                    Optimal  130-159  mg/dL   Borderline  160-189  mg/dL   High  >190     mg/dL   Very High    Additional studies/ records that were reviewed today include: Summarized above.  ASSESSMENT & PLAN:   1. CAD - angina much improved with Imdur. He still has lead-in symptoms in the morning prior to warming up for the day. He does not wish to increase Imdur due to headache. He also has a thoracic aortic aneurysm. He is not eager for aggressive med titration but is agreeable to a trial of metoprolol, starting 12.5mg  nightly so that he has more even anti-anginal coverage between the BB and his Imdur. I asked him to keep [place] posted on how things go, and to notify immediately of any escalating symptoms. Also discussed avoidance of NSAIDS given CV disease. 2. Ascending aortic aneurysm - discussed aneurysm precautions with patient. Start low dose BB. Repeat CT 03/2019. 3. History of pericarditis - no evidence of recurrence on MRI. He inquires if OK to stop colchicine. I think so, especially given drug interaction with statin. He wonders at what point pericarditis overlapped with CAD and I'm not sure we know the answer to that. 4. Mild pulmonary HTN -  likely related to weight, OSA. 5. Hyperlipidemia - he is tolerating statin. Repeat LFTs/lipids in 4 weeks. 6. Mild AS/MR/PR - follow clinically for now.  Disposition: F/u with myself in 3 months (per pt request).  Medication Adjustments/Labs and Tests Ordered: Current medicines are reviewed at length with the patient today.  Concerns regarding medicines are outlined above. Medication changes, Labs and Tests  ordered today are summarized above and listed in the Patient Instructions accessible in Encounters.   Signed, Charlie Pitter, PA-C  04/10/2018 11:21 AM    [address], [place], [place]  [address] Phone: [phone]; Fax: [phone]

## 2018-04-10 ENCOUNTER — Encounter: Payer: Self-pay | Admitting: Physician Assistant

## 2018-04-10 ENCOUNTER — Ambulatory Visit: Payer: PPO | Admitting: Physician Assistant

## 2018-04-10 VITALS — BP 126/80 | HR 86 | Ht 72.0 in | Wt 298.8 lb

## 2018-04-10 DIAGNOSIS — I7121 Aneurysm of the ascending aorta, without rupture: Secondary | ICD-10-CM

## 2018-04-10 DIAGNOSIS — I2510 Atherosclerotic heart disease of native coronary artery without angina pectoris: Secondary | ICD-10-CM | POA: Diagnosis not present

## 2018-04-10 DIAGNOSIS — I340 Nonrheumatic mitral (valve) insufficiency: Secondary | ICD-10-CM | POA: Diagnosis not present

## 2018-04-10 DIAGNOSIS — I272 Pulmonary hypertension, unspecified: Secondary | ICD-10-CM | POA: Diagnosis not present

## 2018-04-10 DIAGNOSIS — E785 Hyperlipidemia, unspecified: Secondary | ICD-10-CM

## 2018-04-10 DIAGNOSIS — I350 Nonrheumatic aortic (valve) stenosis: Secondary | ICD-10-CM

## 2018-04-10 DIAGNOSIS — I371 Nonrheumatic pulmonary valve insufficiency: Secondary | ICD-10-CM

## 2018-04-10 DIAGNOSIS — I712 Thoracic aortic aneurysm, without rupture: Secondary | ICD-10-CM | POA: Diagnosis not present

## 2018-04-10 DIAGNOSIS — Z8679 Personal history of other diseases of the circulatory system: Secondary | ICD-10-CM

## 2018-04-10 MED ORDER — METOPROLOL SUCCINATE ER 25 MG PO TB24: 12.5000 mg | ORAL_TABLET | Freq: Every evening | ORAL | 3 refills | Status: DC

## 2018-04-10 NOTE — Patient Instructions (Addendum)
Medication Instructions:  Your physician has recommended you make the following change in your medication:  1.  STOP the Colchicine 2.  START Toprol Xl 12.5 mg taking 1 tablet every evening  If you need a refill on your cardiac medications before your next appointment, please call your pharmacy.   Lab work: 6 WEEKS:  FASTING LIPID / LFT  If you have labs (blood work) drawn today and your tests are completely normal, you will receive your results only by: Marland Kitchen MyChart Message (if you have MyChart) OR . A paper copy in the mail If you have any lab test that is abnormal or we need to change your treatment, we will call you to review the results.  Testing/Procedures: Non-Cardiac CT Angiography (CTA), 03/2019,  is a special type of CT scan that uses a computer to produce multi-dimensional views of major blood vessels throughout the body. In CT angiography, a contrast material is injected through an IV to help visualize the blood vessels   Follow-Up: Your physician recommends that you schedule a follow-up appointment in: [place], PA-C   Any Other Special Instructions Will Be Listed Below (If Applicable).  Mainstays of therapy for aneurysms include good blood pressure control, healthy lifestyle, and avoiding fluoroquinolone antibiotic medications (such as those in the "Cipro" class, ending in "floxacin") due to risk of damage to the aorta. This is a finding I would expect to be monitored periodically by their primary cardiologist. Since aneurysms can run in families, you should discuss your diagnosis with first degree relatives as they may need to be screened for this. Regular mild-moderate physical exercise is OK but avoid heavy lifting/weight lifting over 30lbs, chopping wood, shoveling snow or digging heavy earth with a shovel.   Patients with heart disease should generally stay away from medicines like ibuprofen, Advil, Motrin, naproxen, and Aleve due to risk of cardiovascular  side effects. You may take Tylenol as directed or talk to your primary doctor about alternatives.    CT Angiogram A CT angiogram is a procedure to look at the blood vessels in various areas of the body. For this procedure, a large X-ray machine, called a CT scanner, takes detailed pictures of blood vessels that have been injected with a dye (contrast material). A CT angiogram allows your health care provider to see how well blood is flowing to the area of your body that is being checked. Your health care provider will be able to see if there are any problems, such as a blockage. Tell a health care provider about:  Any allergies you have.  All medicines you are taking, including vitamins, herbs, eye drops, creams, and over-the-counter medicines.  Any problems you or family members have had with anesthetic medicines.  Any blood disorders you have.  Any surgeries you have had.  Any medical conditions you have.  Whether you are pregnant or may be pregnant.  Whether you are breastfeeding.  Any anxiety disorders, chronic pain, or other conditions you have that may increase your stress or prevent you from lying still. What are the risks? Generally, this is a safe procedure. However, problems may occur, including:  Infection.  Bleeding.  Allergic reactions to medicines or dyes.  Damage to other structures or organs.  Kidney damage from the dye or contrast that is used.  Increased risk of cancer from radiation exposure. This risk is low. Talk with your health care provider about: ? The risks and benefits of testing. ? How you can receive  the lowest dose of radiation.  What happens before the procedure?  Wear comfortable clothing and remove any jewelry.  Follow instructions from your health care provider about eating and drinking. For most people, instructions may include these actions: ? For 12 hours before the test, avoid caffeine. This includes tea, coffee, soda, and energy  drinks or pills. ? For 3-4 hours before the test, stop eating or drinking anything but water. ? Stay well hydrated by continuing to drink water before the exam. This will help to clear the contrast dye from your body after the test.  Ask your health care provider about changing or stopping your regular medicines. This is especially important if you are taking diabetes medicines or blood thinners. What happens during the procedure?  An IV tube will be inserted into one of your veins.  You will be asked to lie on an exam table. This table will slide in and out of the CT machine during the procedure.  Contrast dye will be injected into the IV tube. You might feel warm, or you may get a metallic taste in your mouth.  The table that you are lying on will move into the CT machine tunnel for the scan.  The person running the machine will give you instructions while the scans are being done. You may be asked to: ? Keep your arms above your head. ? Hold your breath. ? Stay very still, even if the table is moving.  When the scanning is complete, you will be moved out of the machine.  The IV tube will be removed. The procedure may vary among health care providers and hospitals. What happens after the procedure?  You might feel warm, or you may get a metallic taste in your mouth.  You may be asked to drink water or other fluids to wash (flush) the contrast material out of your body.  It is up to you to get the results of your procedure. Ask your health care provider, or the department that is doing the procedure, when your results will be ready. Summary  A CT angiogram is a procedure to look at the blood vessels in various areas of the body.  You will need to stay very still during the exam.  You may be asked to drink water or other fluids to wash (flush) the contrast material out of your body after your scan. This information is not intended to replace advice given to you by your health  care provider. Make sure you discuss any questions you have with your health care provider. Document Released: 12/24/2015 Document Revised: 12/24/2015 Document Reviewed: 12/24/2015 Elsevier Interactive Patient Education  [employer].

## 2018-04-25 ENCOUNTER — Ambulatory Visit: Payer: PPO | Admitting: Physician Assistant

## 2018-04-30 DIAGNOSIS — R0789 Other chest pain: Secondary | ICD-10-CM | POA: Diagnosis not present

## 2018-04-30 DIAGNOSIS — R0600 Dyspnea, unspecified: Secondary | ICD-10-CM | POA: Diagnosis not present

## 2018-05-09 HISTORY — PX: SHOULDER SURGERY: SHX246

## 2018-05-22 ENCOUNTER — Other Ambulatory Visit: Payer: PPO

## 2018-05-22 DIAGNOSIS — E785 Hyperlipidemia, unspecified: Secondary | ICD-10-CM

## 2018-05-22 DIAGNOSIS — I371 Nonrheumatic pulmonary valve insufficiency: Secondary | ICD-10-CM

## 2018-05-22 DIAGNOSIS — I272 Pulmonary hypertension, unspecified: Secondary | ICD-10-CM | POA: Diagnosis not present

## 2018-05-22 DIAGNOSIS — I2510 Atherosclerotic heart disease of native coronary artery without angina pectoris: Secondary | ICD-10-CM

## 2018-05-22 DIAGNOSIS — I712 Thoracic aortic aneurysm, without rupture: Secondary | ICD-10-CM

## 2018-05-22 DIAGNOSIS — Z8679 Personal history of other diseases of the circulatory system: Secondary | ICD-10-CM

## 2018-05-22 DIAGNOSIS — I340 Nonrheumatic mitral (valve) insufficiency: Secondary | ICD-10-CM | POA: Diagnosis not present

## 2018-05-22 DIAGNOSIS — I350 Nonrheumatic aortic (valve) stenosis: Secondary | ICD-10-CM | POA: Diagnosis not present

## 2018-05-22 DIAGNOSIS — I7121 Aneurysm of the ascending aorta, without rupture: Secondary | ICD-10-CM

## 2018-05-22 LAB — HEPATIC FUNCTION PANEL
ALBUMIN: 4 g/dL (ref 3.5–4.8)
ALK PHOS: 64 IU/L (ref 39–117)
ALT: 22 IU/L (ref 0–44)
AST: 21 IU/L (ref 0–40)
Bilirubin Total: 0.6 mg/dL (ref 0.0–1.2)
Bilirubin, Direct: 0.2 mg/dL (ref 0.00–0.40)
TOTAL PROTEIN: 6.6 g/dL (ref 6.0–8.5)

## 2018-05-22 LAB — LIPID PANEL
Chol/HDL Ratio: 2.4 ratio (ref 0.0–5.0)
Cholesterol, Total: 116 mg/dL (ref 100–199)
HDL: 49 mg/dL (ref >39)
LDL Calculated: 48 mg/dL (ref 0–99)
Triglycerides: 94 mg/dL (ref 0–149)
VLDL CHOLESTEROL CAL: 19 mg/dL (ref 5–40)

## 2018-05-23 ENCOUNTER — Telehealth: Payer: Self-pay | Admitting: *Deleted

## 2018-05-23 NOTE — Telephone Encounter (Signed)
Has he since repeated his blood pressure? If persistently low I would be concerned that something else was going on, causing it to be low. This dose of metoprolol is so extremely small I would not even expect that it would impact blood pressure. If it is still running low, needs BMET/CBC ASAP to ensure no other new issue going on such as anemia. He is OK to pause the metoprolol for now to see how he feels but if chest pain comes back, needs to let us know as we might need to consider PCI. Can offer to move up OV if patient desires. Mariane Burpee PA-C

## 2018-05-23 NOTE — Telephone Encounter (Signed)
-----   Message from Charlie Pitter, PA-C sent at 05/23/2018  7:42 AM EST ----- Lipids within goal, LFTs normal. Melina Copa PA-C

## 2018-05-23 NOTE — Telephone Encounter (Signed)
DPR ok to s/w pt's wife who has been notified of lab results by phone with verbal understanding. See phone note for further notes from today's call.   Pt's wife states pt has been feeling fatigued and BP running low since starting metoprolol 12.5 mg every evening. She states pt saw Dr. Felipa Eth 04/30/2018, BP taken by Dr. Felipa Eth per pt's wife was 59/70. She does also state pt started with an URI at about that same time as well and just started to get over the URI.   I did advise not feeling well could be making him fatigued and his BP low, though I will touch base with Melina Copa, PA in regards to BP for any further recommendations if any and we will let her know. I advised to have pt increase H20 intake as well as limit caffeine. Pt's wife thanked me for the call.

## 2018-05-23 NOTE — Telephone Encounter (Signed)
I s/w pt's wife (DPR) and went over recommendations per Melina Copa, PA. Pt's wife states they have not taken any other BP since they saw Dr. Felipa Eth. I advised her to see if they can go to [facility] or [facility] etc to get a BP reading today and for the next few days. I suggested that we may just want to move appt up ok per Melina Copa, PA. However, I could not find any available appts with Melina Copa, PA and pt would like to see he again. I did also go over the recommendation as for possible lab work to check kidney function and blood counts to r/o anemia.   Pt's wife said pt has never been anemic. I explained that anyone can become anemic any time some on is on any kind of blood thinners and pt does take ASA 81 even though a low dose can cause bleeding. I stated I will d/w Melina Copa, PA which I feel she may say let's just go ahead and get the lab work to r/o anemia. Wife said they will get BP today and the next few days.

## 2018-07-03 NOTE — Progress Notes (Addendum)
Cardiology Office Note    Date:  07/05/2018  ID:  Clarence Andrade, DOB 06-Sep-1945, MRN [id number] PCP:  Lajean Manes, MD  Cardiologist:  Fransico Him, MD   Chief Complaint: f/u CAD  History of Present Illness:  Clarence Andrade is a 73 y.o. male with history of CAD by cath 03/2018, ascending aortic aneurysm, pericarditis, hyperglycemia by labs, hyponatremia by labs, dyslipidemia, adrenal incidentaloma, suspected OSA, mild pulmonary HTN, morbid obesity, ventral hernia who presents for routine follow-up. He had remote diagnosis of pericarditis and a/so has prior h/o coronary calcifications in CT. More recently he underwent workup for exertional chest pain that had been present ever since 06/2017. CRP was elevated. 2D echo 03/08/18 showed EF 55-60%, grade 1 DD, mild AS, mildly dilated ascending aorta, mild MR/PR, mild pulm HTN. CT angio 03/13/18 ruled out PE but did show 4.4cm ascending thoracic aortic aneurysm. Nuclear stress testing was abnormal with positive EKG, angina with stress, and imaging showing distal anterior wall apex and septum ischemia, EF 47%. Cath was arranged showing CTO of the prox-mid LAD with bridging, L-L and R-L collaterals, moderate, non-obstructive disease involving the proximal LAD, proximal LCx, and distal RCA, more severe disease noted involving small branch vessels (RV marginal and superior branch of OM3). Cath notable for inability to cross the aortic valve for left heart catheterization due to insufficient catheter length complicated by tortuous subclavian artery and dilated ascending aorta.  Imdur was added, reserving CTO PCI for refractory angina with at least 2 antianginals. Cardiac MRI 04/09/18 done to exclude pericarditis showed normal EF 56% with mild mid-apical anteroseptal hypokinesis, mildly dilated RV with normal systolic function, <89% wall thickness subendocardial LGE in the mid-apical anteroseptal wall, consistent with prior infarction, no evidence for pericardial  inflammation or pericardial effusion. When I saw him in follow-up 04/2018, he was impressed what a difference Imdur had made. He still had mild residual angina in the morning with activity but this would improve as the day went on. He was trialed on low dose Toprol. Last labs 05/22/18 showed LDL 48, LFTs normal, 03/2018 Na 133, K 3.5, Cr 0.67, glucose 127, Hgb 16.7.  He returns for follow-up today feeling well. He has worked his way up to an hour long water aerobics class and walking on the treadmill without angina. He will sometimes feel a vague "restriction" if he pushes himself but this is infrequent, about once a week, and resolves with backing off the overexertion. No SOB, edema, syncope, palpitations. A few weeks ago he had some issues with softer BP but this seems better. No blood loss noted. He has struggled with chronic fatigue the last few years. He reports a prior sleep study 15 yrs ago that showed OSA but he reports the test was done wrong where he was strapped to the bed and did not sleep. He is unwilling to revisit this as he does not think he would tolerate CPAP. He has had some joint aches and pains.   Past Medical History:  Diagnosis Date  . Abdominal hernia    Midline  . Adrenal incidentaloma ([place]) 2010  . Aortic stenosis    mild by echo 02/2018  . Ascending aortic aneurysm ([place])    a. 4.4cm by imaging 03/2018.  Marland Kitchen CAD (coronary artery disease)    a. Cath 03/2018 - CTO of the prox-mid LAD with bridging, L-L and R-L collaterals, moderate, non-obstructive disease involving the proximal LAD, proximal LCx, and distal RCA, more severe disease noted involving small branch  vessels (RV marginal and superior branch of OM3).  . Hyperlipidemia   . OSA (obstructive sleep apnea)    Severe PSG 1/07 AHI 66/hr, O2 Nadir 50% Refuses treatment  . Pericarditis    now on colchicine  . Pulmonary HTN ([place])    mild with PASP 32mmHg by echo 02/2018  . Screening for AAA (abdominal aortic aneurysm) 2010    CT Abd/Pelvis  . Ventral hernia    03/2009    Past Surgical History:  Procedure Laterality Date  . CORONARY ANGIOGRAPHY N/A 03/23/2018   Procedure: CORONARY ANGIOGRAPHY;  Surgeon: Nelva Bush, MD;  Location: [facility];  Service: Cardiovascular;  Laterality: N/A;  . HERNIA REPAIR  1/04  . KNEE ARTHROSCOPY Left 05/04  . TOTAL KNEE ARTHROPLASTY Bilateral 05/12  . VENTRAL HERNIA REPAIR  03/2009    Current Medications: Current Meds  Medication Sig  . Acetaminophen (TYLENOL PO) Take 500 mg by mouth every 6 (six) hours as needed.  Marland Kitchen aspirin EC 81 MG tablet Take 81 mg by mouth daily.  . cetirizine (ZYRTEC) 10 MG tablet Take 10 mg by mouth daily as needed for allergies.  . Coenzyme Q10 (CO Q 10) 100 MG CAPS Take 100 mg by mouth daily.  . isosorbide mononitrate (IMDUR) 30 MG 24 hr tablet Take 1 tablet (30 mg total) by mouth daily.  . metoprolol succinate (TOPROL XL) 25 MG 24 hr tablet Take 0.5 tablets (12.5 mg total) by mouth every evening.  . Multiple Vitamin (MULTIVITAMIN) tablet Take 1 tablet by mouth daily.  . nitroGLYCERIN (NITROSTAT) 0.4 MG SL tablet Place 1 tablet (0.4 mg total) under the tongue every 5 (five) minutes as needed for chest pain.  . rosuvastatin (CRESTOR) 20 MG tablet Take 1 tablet (20 mg total) by mouth daily.   Allergies:   Sertraline hcl   Social History   Socioeconomic History  . Marital status: Married    Spouse name: Not on file  . Number of children: Not on file  . Years of education: Not on file  . Highest education level: Not on file  Occupational History  . Not on file  Social Needs  . Financial resource strain: Not on file  . Food insecurity:    Worry: Not on file    Inability: Not on file  . Transportation needs:    Medical: Not on file    Non-medical: Not on file  Tobacco Use  . Smoking status: Former Research scientist (life sciences)  . Smokeless tobacco: Never Used  Substance and Sexual Activity  . Alcohol use: No  . Drug use: No  . Sexual  activity: Not on file  Lifestyle  . Physical activity:    Days per week: Not on file    Minutes per session: Not on file  . Stress: Not on file  Relationships  . Social connections:    Talks on phone: Not on file    Gets together: Not on file    Attends religious service: Not on file    Active member of club or organization: Not on file    Attends meetings of clubs or organizations: Not on file    Relationship status: Not on file  Other Topics Concern  . Not on file  Social History Narrative  . Not on file     Family History:  The patient's family history includes Aneurysm in his father; Heart attack in his father; Heart disease in his father.  ROS:   Please see the history of present  illness.  All other systems are reviewed and otherwise negative.    PHYSICAL EXAM:   VS:  BP 124/72   Pulse 68   Ht 6' (1.829 m)   Wt 298 lb (135.2 kg)   BMI 40.42 kg/m   BMI: Body mass index is 40.42 kg/m. GEN: Well nourished, well developed obese WM, in no acute distress HEENT: normocephalic, atraumatic Neck: no JVD, carotid bruits, or masses Cardiac: RRR; no murmurs, rubs, or gallops, no edema, good pulses Respiratory:  clear to auscultation bilaterally, normal work of breathing GI: soft, nontender, nondistended, + BS MS: no deformity or atrophy Skin: warm and dry, no rash Neuro:  Alert and Oriented x 3, Strength and sensation are intact, follows commands Psych: euthymic mood, full affect  Wt Readings from Last 3 Encounters:  07/05/18 298 lb (135.2 kg)  04/10/18 298 lb 12.8 oz (135.5 kg)  03/23/18 300 lb (136.1 kg)      Studies/Labs Reviewed:   EKG:  EKG was ordered today and personally reviewed by me and demonstrates NSR with TWI I, avL, nonspeciif changes otherwise on acute chnages  Recent Labs: 03/07/2018: NT-Pro BNP 371 03/20/2018: BUN 17; Creatinine, Ser 0.86; Hemoglobin 16.7; Platelets 231; Potassium 4.6; Sodium 145 05/22/2018: ALT 22   Lipid Panel    Component  Value Date/Time   CHOL 116 05/22/2018 0945   TRIG 94 05/22/2018 0945   HDL 49 05/22/2018 0945   CHOLHDL 2.4 05/22/2018 0945   CHOLHDL 3.2 06/05/2010 0715   VLDL 10 06/05/2010 0715   LDLCALC 48 05/22/2018 0945    Additional studies/ records that were reviewed today include: Summarized above.   ASSESSMENT & PLAN:   1. CAD - he is satisfied with how he is feeling and generally able to exercise without any angina. Will continue ASA, Imdur, BB, statin. He will notify for any escalating symptoms. 2. Ascending aortic aneurysm - repeat CT due 03/2019 - this is already in the queue. Continue BB, statin. Discussed aneurysm precautions last visit. They have kept their info sheet per their report. 3. Hyperglycemia/hyponatremia by labs - recheck today. 4. Dyslipidemia - unclear if myalgias are related to age/arthritis or statin. Will decrease Crestor to 10mg  daily as a trial. F/u lipid profile in 6 weeks and re-evaluate symptoms at that time. 5. Mild pulm HTN with mild AS/MR/PR - declines repeat sleep study. Follow clinically for now.  6. Fatigue - discussed possible role of OSA but he declines repeat study. Check CBC, BMET, TSH - also checking given recent report of soft BP. If soft BP recurs, could consider decreasing stopping BB or decreasing Imdur although these have really helped his angina.  Disposition: F/u with myself per pt request in 6 months.  Medication Adjustments/Labs and Tests Ordered: Current medicines are reviewed at length with the patient today.  Concerns regarding medicines are outlined above. Medication changes, Labs and Tests ordered today are summarized above and listed in the Patient Instructions accessible in Encounters.   Signed, Charlie Pitter, PA-C  07/05/2018 2:17 PM    [facility] [address], [place], [place]  [address] Phone: [phone]; Fax: [phone]

## 2018-07-05 ENCOUNTER — Encounter: Payer: Self-pay | Admitting: Physician Assistant

## 2018-07-05 ENCOUNTER — Ambulatory Visit: Payer: PPO | Admitting: Physician Assistant

## 2018-07-05 VITALS — BP 124/72 | HR 68 | Ht 72.0 in | Wt 298.0 lb

## 2018-07-05 DIAGNOSIS — R5383 Other fatigue: Secondary | ICD-10-CM

## 2018-07-05 DIAGNOSIS — E871 Hypo-osmolality and hyponatremia: Secondary | ICD-10-CM

## 2018-07-05 DIAGNOSIS — R739 Hyperglycemia, unspecified: Secondary | ICD-10-CM

## 2018-07-05 DIAGNOSIS — E785 Hyperlipidemia, unspecified: Secondary | ICD-10-CM

## 2018-07-05 DIAGNOSIS — I712 Thoracic aortic aneurysm, without rupture: Secondary | ICD-10-CM

## 2018-07-05 DIAGNOSIS — I7121 Aneurysm of the ascending aorta, without rupture: Secondary | ICD-10-CM

## 2018-07-05 DIAGNOSIS — I272 Pulmonary hypertension, unspecified: Secondary | ICD-10-CM | POA: Diagnosis not present

## 2018-07-05 DIAGNOSIS — I2510 Atherosclerotic heart disease of native coronary artery without angina pectoris: Secondary | ICD-10-CM | POA: Diagnosis not present

## 2018-07-05 MED ORDER — ROSUVASTATIN CALCIUM 10 MG PO TABS: 10.0000 mg | ORAL_TABLET | Freq: Every day | ORAL | 3 refills | Status: DC

## 2018-07-05 NOTE — Patient Instructions (Addendum)
Medication Instructions:  Your physician has recommended you make the following change in your medication:  1.  DECREASE the Crestor to 10 mg daily 1/2 of the 20 mg tablet .  If you need a refill on your cardiac medications before your next appointment, please call your pharmacy.   Lab work: TODAY:  BMET, CBC, & TSH 6 WEEKS: 08/16/2018  FASTING LIPID  If you have labs (blood work) drawn today and your tests are completely normal, you will receive your results only by: Marland Kitchen MyChart Message (if you have MyChart) OR . A paper copy in the mail If you have any lab test that is abnormal or we need to change your treatment, we will call you to review the results.  Testing/Procedures: None ordered  Follow-Up: At [facility], you and your health needs are our priority.  As part of our continuing mission to provide you with exceptional heart care, we have created designated Provider Care Teams.  These Care Teams include your primary Cardiologist (physician) and Advanced Practice Providers (APPs -  Physician Assistants and Nurse Practitioners) who all work together to provide you with the care you need, when you need it. You will need a follow up appointment in 6 months.  Please call our office 2 months in advance to schedule this appointment.  You may see Fransico Him, MD or one of the following Advanced Practice Providers on your designated Care Team:   Homer, PA-C Melina Copa, PA-C . Ermalinda Barrios, PA-C  Any Other Special Instructions Will Be Listed Below (If Applicable).

## 2018-07-06 LAB — CBC
HEMATOCRIT: 46.2 % (ref 37.5–51.0)
Hemoglobin: 15.7 g/dL (ref 13.0–17.7)
MCH: 31.7 pg (ref 26.6–33.0)
MCHC: 34 g/dL (ref 31.5–35.7)
MCV: 93 fL (ref 79–97)
Platelets: 195 10*3/uL (ref 150–450)
RBC: 4.96 x10E6/uL (ref 4.14–5.80)
RDW: 12.4 % (ref 11.6–15.4)
WBC: 7.9 10*3/uL (ref 3.4–10.8)

## 2018-07-06 LAB — TSH: TSH: 1.47 u[IU]/mL (ref 0.450–4.500)

## 2018-07-06 LAB — BASIC METABOLIC PANEL
BUN/Creatinine Ratio: 17 (ref 10–24)
BUN: 14 mg/dL (ref 8–27)
CHLORIDE: 102 mmol/L (ref 96–106)
CO2: 24 mmol/L (ref 20–29)
Calcium: 8.9 mg/dL (ref 8.6–10.2)
Creatinine, Ser: 0.82 mg/dL (ref 0.76–1.27)
GFR calc Af Amer: 102 mL/min/{1.73_m2} (ref >59)
GFR calc non Af Amer: 88 mL/min/{1.73_m2} (ref >59)
Glucose: 100 mg/dL — ABNORMAL HIGH (ref 65–99)
Potassium: 4.5 mmol/L (ref 3.5–5.2)
Sodium: 140 mmol/L (ref 134–144)

## 2018-08-16 ENCOUNTER — Other Ambulatory Visit: Payer: PPO

## 2018-09-09 ENCOUNTER — Ambulatory Visit: Payer: PPO

## 2018-09-10 ENCOUNTER — Other Ambulatory Visit: Payer: Self-pay

## 2018-09-10 MED ORDER — ISOSORBIDE MONONITRATE ER 30 MG PO TB24: 30.0000 mg | ORAL_TABLET | Freq: Every day | ORAL | 3 refills | Status: DC

## 2018-09-14 ENCOUNTER — Other Ambulatory Visit: Payer: PPO

## 2018-09-14 ENCOUNTER — Other Ambulatory Visit: Payer: Self-pay

## 2018-09-14 DIAGNOSIS — I712 Thoracic aortic aneurysm, without rupture: Secondary | ICD-10-CM | POA: Diagnosis not present

## 2018-09-14 DIAGNOSIS — E871 Hypo-osmolality and hyponatremia: Secondary | ICD-10-CM

## 2018-09-14 DIAGNOSIS — I2510 Atherosclerotic heart disease of native coronary artery without angina pectoris: Secondary | ICD-10-CM | POA: Diagnosis not present

## 2018-09-14 DIAGNOSIS — I272 Pulmonary hypertension, unspecified: Secondary | ICD-10-CM

## 2018-09-14 DIAGNOSIS — R739 Hyperglycemia, unspecified: Secondary | ICD-10-CM | POA: Diagnosis not present

## 2018-09-14 DIAGNOSIS — E785 Hyperlipidemia, unspecified: Secondary | ICD-10-CM | POA: Diagnosis not present

## 2018-09-14 DIAGNOSIS — I7121 Aneurysm of the ascending aorta, without rupture: Secondary | ICD-10-CM

## 2018-09-14 DIAGNOSIS — R5383 Other fatigue: Secondary | ICD-10-CM | POA: Diagnosis not present

## 2018-09-14 LAB — LIPID PANEL
Chol/HDL Ratio: 2.7 ratio (ref 0.0–5.0)
Cholesterol, Total: 130 mg/dL (ref 100–199)
HDL: 49 mg/dL (ref >39)
LDL Calculated: 64 mg/dL (ref 0–99)
Triglycerides: 84 mg/dL (ref 0–149)
VLDL Cholesterol Cal: 17 mg/dL (ref 5–40)

## 2018-10-02 ENCOUNTER — Ambulatory Visit: Payer: PPO | Admitting: Cardiology

## 2018-10-02 ENCOUNTER — Other Ambulatory Visit: Payer: Self-pay

## 2018-10-02 ENCOUNTER — Encounter: Payer: Self-pay | Admitting: Cardiology

## 2018-10-02 ENCOUNTER — Telehealth: Payer: Self-pay | Admitting: *Deleted

## 2018-10-02 VITALS — BP 140/84 | HR 62 | Ht 72.0 in | Wt 307.0 lb

## 2018-10-02 DIAGNOSIS — I25118 Atherosclerotic heart disease of native coronary artery with other forms of angina pectoris: Secondary | ICD-10-CM | POA: Diagnosis not present

## 2018-10-02 DIAGNOSIS — R55 Syncope and collapse: Secondary | ICD-10-CM

## 2018-10-02 DIAGNOSIS — E785 Hyperlipidemia, unspecified: Secondary | ICD-10-CM | POA: Diagnosis not present

## 2018-10-02 DIAGNOSIS — I350 Nonrheumatic aortic (valve) stenosis: Secondary | ICD-10-CM | POA: Diagnosis not present

## 2018-10-02 DIAGNOSIS — I272 Pulmonary hypertension, unspecified: Secondary | ICD-10-CM | POA: Diagnosis not present

## 2018-10-02 NOTE — Telephone Encounter (Signed)
Tried to call pt re: mychart message received.  Per Dayna, pt needs an in office visit today with EKG and labs. Called pt and left a message on his cell phone to call the office back to get this scheduled.

## 2018-10-02 NOTE — Telephone Encounter (Signed)
Please arrange for ASAP in person visit today for increasing episodes of pre-syncope - he should not drive himself. Needs labs, exam, EKG and rhythm strip. Dayna Dunn PA-C

## 2018-10-02 NOTE — Telephone Encounter (Signed)
7 day ZIO XT long term holter monitor to be mailed to your home.  Instructions to apply and ship monitor back are included in the monitor kit.

## 2018-10-02 NOTE — Progress Notes (Signed)
Cardiology Office Note:    Date:  10/02/2018   ID:  Clarence Andrade, DOB [birth year], MRN [id number]  PCP:  Lajean Manes, MD  Cardiologist:  Fransico Him, MD  Referring MD: Lajean Manes, MD   Chief Complaint  Patient presents with   Near Syncope    History of Present Illness:    Clarence Andrade is a 73 y.o. male with a past medical history significant for CAD by cath 03/2018, ascending aortic aneurysm, pericarditis, hyperglycemia by labs, hyponatremia by labs, dyslipidemia, adrenal incidentaloma, suspected OSA, mild pulmonary HTN, morbid obesity, ventral hernia who presents for routine follow-up. He had remote diagnosis of pericarditis and a/so has prior h/o coronary calcifications in CT. More recently he underwent workup for exertional chest pain that had been present ever since 06/2017. CRP was elevated. 2D echo 03/08/18 showed EF 55-60%, grade 1 DD, mild AS, mildly dilated ascending aorta, mild MR/PR, mild pulm HTN. CT angio 03/13/18 ruled out PE but did show 4.4cm ascending thoracic aortic aneurysm. Nuclear stress testing was abnormal with positive EKG, angina with stress, and imaging showing distal anterior wall apex and septum ischemia, EF 47%. Cath was arranged showing CTO of the prox-mid LAD with bridging, L-L and R-L collaterals, moderate, non-obstructive disease involving the proximal LAD, proximal LCx, and distal RCA, more severe disease noted involving small branch vessels (RV marginal and superior branch of OM3). Cath notable for inability to cross the aortic valve for left heart catheterization due to insufficient catheter length complicated by tortuous subclavian artery and dilated ascending aorta.  Imdur was added, reserving CTO PCI for refractory angina with at least 2 antianginals. Cardiac MRI 04/09/18 done to exclude pericarditis showed normal EF 56% with mild mid-apical anteroseptal hypokinesis, mildly dilated RV with normal systolic function, <32% wall thickness subendocardial  LGE in the mid-apical anteroseptal wall, consistent with prior infarction, no evidence for pericardial inflammation or pericardial effusion.   He was diagnosed with OSA in the past and didn't think he would tolerate CPAP. He did not want to revisit possible treatment in 06/2018.   He was last seen in the office on 07/05/18 by Melina Copa, PA at which time he was feeling well and was doing an hour of water aerobics and walking on a treadmill with no anginal symptoms.   [name] is here today for complaints of near passing out.  He has felt that his stamina has been worsening since last fall. That is when his coronary blockage was found. Since he started the heart medicines he has been having a mild feeling of going to pass out. It only lasts for a few seconds. This has now become more frequent. He had the feeling while he was sitting out in the lobby today. He denies palpitations or heart fluttering. He feels a sense of his vision going gray, like the lights dim. Occurs more often when he is sitting watching TV or doing nothing, not with activity or standing up. He worked in his yard outside for 4 hours on Saturday without any symptoms. He has no chest pain or shortness of breath. He does not think he has been dehydrated. No orthopnea, PND or significant edema. His wt is up but he relates this to being at home during the pandemic with  No exercise and eating too much.  He checks his BP at home daily and has checked it with his symptoms. BP usually runs 125/65.   Past Medical History:  Diagnosis Date   Abdominal hernia  Midline   Adrenal incidentaloma ([place]) 2010   Aortic stenosis    mild by echo 02/2018   Ascending aortic aneurysm ([place])    a. 4.4cm by imaging 03/2018.   CAD (coronary artery disease)    a. Cath 03/2018 - CTO of the prox-mid LAD with bridging, L-L and R-L collaterals, moderate, non-obstructive disease involving the proximal LAD, proximal LCx, and distal RCA, more severe  disease noted involving small branch vessels (RV marginal and superior branch of OM3).   Hyperlipidemia    OSA (obstructive sleep apnea)    Severe PSG 1/07 AHI 66/hr, O2 Nadir 50% Refuses treatment - Pt does not believe test was accurate   Pericarditis    now on colchicine   Pulmonary HTN (HCC)    mild with PASP 7mmHg by echo 02/2018   Screening for AAA (abdominal aortic aneurysm) 2010   CT Abd/Pelvis   Ventral hernia    03/2009    Past Surgical History:  Procedure Laterality Date   CORONARY ANGIOGRAPHY N/A 03/23/2018   Procedure: CORONARY ANGIOGRAPHY;  Surgeon: Nelva Bush, MD;  Location: [facility];  Service: Cardiovascular;  Laterality: N/A;   HERNIA REPAIR  1/04   KNEE ARTHROSCOPY Left 05/04   TOTAL KNEE ARTHROPLASTY Bilateral 05/12   VENTRAL HERNIA REPAIR  03/2009    Current Medications: Current Meds  Medication Sig   Acetaminophen (TYLENOL PO) Take 500 mg by mouth every 6 (six) hours as needed.   aspirin EC 81 MG tablet Take 81 mg by mouth daily.   cetirizine (ZYRTEC) 10 MG tablet Take 10 mg by mouth daily as needed for allergies.   Coenzyme Q10 (CO Q 10) 100 MG CAPS Take 100 mg by mouth daily.   isosorbide mononitrate (IMDUR) 30 MG 24 hr tablet Take 1 tablet (30 mg total) by mouth daily.   metoprolol succinate (TOPROL XL) 25 MG 24 hr tablet Take 0.5 tablets (12.5 mg total) by mouth every evening.   Multiple Vitamin (MULTIVITAMIN) tablet Take 1 tablet by mouth daily.   nitroGLYCERIN (NITROSTAT) 0.4 MG SL tablet Place 1 tablet (0.4 mg total) under the tongue every 5 (five) minutes as needed for chest pain.   rosuvastatin (CRESTOR) 10 MG tablet Take 1 tablet (10 mg total) by mouth daily.     Allergies:   Sertraline hcl   Social History   Socioeconomic History   Marital status: Married    Spouse name: Not on file   Number of children: Not on file   Years of education: Not on file   Highest education level: Not on file    Occupational History   Not on file  Social Needs   Financial resource strain: Not on file   Food insecurity:    Worry: Not on file    Inability: Not on file   Transportation needs:    Medical: Not on file    Non-medical: Not on file  Tobacco Use   Smoking status: Former Smoker   Smokeless tobacco: Never Used  Substance and Sexual Activity   Alcohol use: No   Drug use: No   Sexual activity: Not on file  Lifestyle   Physical activity:    Days per week: Not on file    Minutes per session: Not on file   Stress: Not on file  Relationships   Social connections:    Talks on phone: Not on file    Gets together: Not on file    Attends religious service: Not on file  Active member of club or organization: Not on file    Attends meetings of clubs or organizations: Not on file    Relationship status: Not on file  Other Topics Concern   Not on file  Social History Narrative   Not on file     Family History: The patient's family history includes Aneurysm in his father; Heart attack in his father; Heart disease in his father. ROS:   Please see the history of present illness.     All other systems reviewed and are negative.  EKGs/Labs/Other Studies Reviewed:    The following studies were reviewed today:  CORONARY ANGIOGRAPHY  03/23/2018   Conclusions: 1. Severe single-vessel coronary artery disease, with chronic total occlusion of the proximal to mid LAD with bridging, left-to-left, and right-to-left collaterals. 2. Moderate, non-obstructive disease involving the proximal LAD, proximal LCx, and distal RCA.  More severe disease noted involving small branch vessels (RV marginal and superior branch of OM3). 3. Inability to cross the aortic valve for left heart catheterization due to insufficient catheter length complicated by tortuous subclavian artery and dilated ascending aorta.  LVEF noted to be normal on recent echocardiogram.  Recommendations: 1. Medical  therapy and secondary prevention.  I will start isosorbide mononitrate 30 mg daily. 2. If Mr. Onstott continues to have symptoms despite optimal medical therapy (max tolerated doses of at least 2 antianginal agents), PCI to CTO of the LAD should be considered. 3. Follow-up with Dr. Radford Pax or APP in ~2 weeks.  Recommend aspirin 81mg  daily for moderate/severe CAD.    Echocardiogram 03/08/2018 Study Conclusions - Left ventricle: The cavity size was normal. There was severe   concentric hypertrophy. Systolic function was normal. The   estimated ejection fraction was in the range of 55% to 60%. Wall   motion was normal; there were no regional wall motion   abnormalities. There was an increased relative contribution of   atrial contraction to ventricular filling. Doppler parameters are   consistent with abnormal left ventricular relaxation (grade 1   diastolic dysfunction). - Aortic valve: Moderately calcified annulus. Trileaflet; mildly   thickened, mildly calcified leaflets. There was mild stenosis.   There was trivial regurgitation. Mean gradient (S): 15 mm Hg.   Valve area (VTI): 1.53 cm^2. Valve area (Vmax): 1.48 cm^2. Valve   area (Vmean): 1.46 cm^2. - Aorta: Ascending aorta diameter: 41 mm (ED). - Ascending aorta: The ascending aorta was mildly dilated. - Mitral valve: Calcified annulus. There was mild regurgitation. - Left atrium: The atrium was mildly dilated. - Pulmonic valve: There was mild regurgitation. - Pulmonary arteries: PA peak pressure: 39 mm Hg (S).  Impressions: - The right ventricular systolic pressure was increased consistent   with mild pulmonary hypertension.  EKG:  EKG is ordered today.  The ekg ordered today demonstrates SR with 1st degree AVB, non-specific T abn, 62 bpm, QTC 412  Recent Labs: 03/07/2018: NT-Pro BNP 371 05/22/2018: ALT 22 07/05/2018: BUN 14; Creatinine, Ser 0.82; Hemoglobin 15.7; Platelets 195; Potassium 4.5; Sodium 140; TSH 1.470   Recent  Lipid Panel    Component Value Date/Time   CHOL 130 09/14/2018 0853   TRIG 84 09/14/2018 0853   HDL 49 09/14/2018 0853   CHOLHDL 2.7 09/14/2018 0853   CHOLHDL 3.2 06/05/2010 0715   VLDL 10 06/05/2010 0715   LDLCALC 64 09/14/2018 0853    Physical Exam:    VS:  Ht 6' (1.829 m)    Wt (!) 307 lb (139.3 kg)    SpO2  95%    BMI 41.64 kg/m     Wt Readings from Last 3 Encounters:  10/02/18 (!) 307 lb (139.3 kg)  07/05/18 298 lb (135.2 kg)  04/10/18 298 lb 12.8 oz (135.5 kg)     Physical Exam  Constitutional: He is oriented to person, place, and time. He appears well-developed and well-nourished. No distress.  Obese male  HENT:  Head: Normocephalic and atraumatic.  Neck: Normal range of motion. Neck supple. No JVD present.  Cardiovascular: Normal rate and regular rhythm.  Murmur heard.  Midsystolic murmur is present with a grade of 2/6 at the upper right sternal border radiating to the neck. Pulmonary/Chest: Effort normal and breath sounds normal. No respiratory distress. He has no wheezes. He has no rales.  Abdominal: Soft. Bowel sounds are normal.  Musculoskeletal: Normal range of motion.        General: Edema present.     Comments: Trace pretibial edema.   Neurological: He is alert and oriented to person, place, and time.  Skin: Skin is warm and dry.  Psychiatric: He has a normal mood and affect. His behavior is normal. Judgment and thought content normal.  Nursing note and vitals reviewed.    ASSESSMENT:    1. Near syncope   2. Coronary artery disease of native artery of native heart with stable angina pectoris ([place])   3. Aortic valve stenosis, etiology of cardiac valve disease unspecified   4. Dyslipidemia, goal LDL below 70   5. Pulmonary hypertension, unspecified ([place])    PLAN:    In order of problems listed above:  Near syncope -Occurs at rest with no chest pain or palpitations. BP per home monitor has been normal even with event. Orthostatic VS here not  revealing. No other neurologic deficits.  -This has been occurring since at least last fall, but becoming more frequent.  -Unclear what is causing his symptoms.  -Will check labs and a 7 day monitor as his symptoms occr every day to assess for arrhythmic causes.  -Wt is up but no other clear S/S of HF.  -Since he has hx of mild aortic stenosis and has murmur, will recheck echo for worsening or valve disease. -Also has possible faint left carotid bruit vs referred cardiac murmur. No prior carotid artery studies. Will check carotid US.  -If we do not find cardiogenic cause he may need neurologic and/or ophthalmologic evaluation. I discussed pt calling his PCP for his input.   CAD -No anginal symptoms.   Aortic stenosis -Mild by echo in 02/2018. Murmur present.  -As above, will recheck echo for possible worsening AS.  Dyslipidemia, LDL goal <70 -LDL was 64 in 09/2018. Well cotnrolled. Continue current medications.   Mild pulm HTN with mild AS/MR/PR  -declines repeat sleep study. Follow clinically for now.     Medication Adjustments/Labs and Tests Ordered: Current medicines are reviewed at length with the patient today.  Concerns regarding medicines are outlined above. Labs and tests ordered and medication changes are outlined in the patient instructions below:  Patient Instructions  Medication Instructions:  Your physician recommends that you continue on your current medications as directed. Please refer to the Current Medication list given to you today.  If you need a refill on your cardiac medications before your next appointment, please call your pharmacy.   Lab work: TODAY: BNP, TSH & CBC  If you have labs (blood work) drawn today and your tests are completely normal, you will receive your results only by:  MyChart  Message (if you have MyChart) OR  A paper copy in the mail If you have any lab test that is abnormal or we need to change your treatment, we will call you to review  the results.  Testing/Procedures: Your physician has requested that you have a carotid duplex. This test is an ultrasound of the carotid arteries in your neck. It looks at blood flow through these arteries that supply the brain with blood. Allow one hour for this exam. There are no restrictions or special instructions.  Your physician has ordered for you to wear a long term monitor for 7 days   Your physician has requested that you have an echocardiogram. Echocardiography is a painless test that uses sound waves to create images of your heart. It provides your doctor with information about the size and shape of your heart and how well your hearts chambers and valves are working. This procedure takes approximately one hour. There are no restrictions for this procedure.    Follow-Up: At [facility], you and your health needs are our priority.  As part of our continuing mission to provide you with exceptional heart care, we have created designated Provider Care Teams.  These Care Teams include your primary Cardiologist (physician) and Advanced Practice Providers (APPs -  Physician Assistants and Nurse Practitioners) who all work together to provide you with the care you need, when you need it. You will need a follow up appointment in 3 months.  Please call our office 2 months in advance to schedule this appointment.  You may see Fransico Him, MD or one of the following Advanced Practice Providers on your designated Care Team:   Lyda Jester, PA-C Dayna Dunn, PA-C  Ermalinda Barrios, PA-C  Any Other Special Instructions Will Be Listed Below (If Applicable).       Signed, Daune Perch, NP  10/02/2018 1:12 PM    [facility]

## 2018-10-02 NOTE — Patient Instructions (Addendum)
Medication Instructions:  Your physician recommends that you continue on your current medications as directed. Please refer to the Current Medication list given to you today.  If you need a refill on your cardiac medications before your next appointment, please call your pharmacy.   Lab work: TODAY: BNP, TSH & CBC  If you have labs (blood work) drawn today and your tests are completely normal, you will receive your results only by: Marland Kitchen MyChart Message (if you have MyChart) OR . A paper copy in the mail If you have any lab test that is abnormal or we need to change your treatment, we will call you to review the results.  Testing/Procedures: Your physician has requested that you have a carotid duplex. This test is an ultrasound of the carotid arteries in your neck. It looks at blood flow through these arteries that supply the brain with blood. Allow one hour for this exam. There are no restrictions or special instructions.  Your physician has ordered for you to wear a long term monitor for 7 days   Your physician has requested that you have an echocardiogram. Echocardiography is a painless test that uses sound waves to create images of your heart. It provides your doctor with information about the size and shape of your heart and how well your heart's chambers and valves are working. This procedure takes approximately one hour. There are no restrictions for this procedure.    Follow-Up: At [facility], you and your health needs are our priority.  As part of our continuing mission to provide you with exceptional heart care, we have created designated Provider Care Teams.  These Care Teams include your primary Cardiologist (physician) and Advanced Practice Providers (APPs -  Physician Assistants and Nurse Practitioners) who all work together to provide you with the care you need, when you need it. You will need a follow up appointment in 3 months.  Please call our office 2 months in advance to  schedule this appointment.  You may see Fransico Him, MD or one of the following Advanced Practice Providers on your designated Care Team:   [place], PA-C Melina Copa, PA-C . Ermalinda Barrios, PA-C  Any Other Special Instructions Will Be Listed Below (If Applicable).

## 2018-10-02 NOTE — Telephone Encounter (Signed)
Pt called back and pt has been scheduled for [facility] today, 527/2020 @ 10:45.

## 2018-10-03 ENCOUNTER — Ambulatory Visit: Payer: PPO | Admitting: Cardiology

## 2018-10-03 LAB — CBC
Hematocrit: 44.8 % (ref 37.5–51.0)
Hemoglobin: 15.7 g/dL (ref 13.0–17.7)
MCH: 32.2 pg (ref 26.6–33.0)
MCHC: 35 g/dL (ref 31.5–35.7)
MCV: 92 fL (ref 79–97)
Platelets: 190 10*3/uL (ref 150–450)
RBC: 4.87 x10E6/uL (ref 4.14–5.80)
RDW: 12.6 % (ref 11.6–15.4)
WBC: 7.8 10*3/uL (ref 3.4–10.8)

## 2018-10-03 LAB — TSH: TSH: 2.24 u[IU]/mL (ref 0.450–4.500)

## 2018-10-03 LAB — PRO B NATRIURETIC PEPTIDE: NT-Pro BNP: 240 pg/mL (ref 0–376)

## 2018-10-03 NOTE — Addendum Note (Signed)
Addended by: Gar Ponto on: 10/03/2018 03:31 PM   Modules accepted: Orders

## 2018-10-03 NOTE — Telephone Encounter (Signed)
This encounter was created in error - please disregard.

## 2018-10-05 ENCOUNTER — Other Ambulatory Visit: Payer: Self-pay

## 2018-10-05 ENCOUNTER — Ambulatory Visit (HOSPITAL_COMMUNITY)
Admission: RE | Admit: 2018-10-05 | Discharge: 2018-10-05 | Disposition: A | Discharge status: Home / Self Care | Payer: PPO | Source: Ambulatory Visit | Attending: Internal Medicine | Admitting: Internal Medicine

## 2018-10-05 DIAGNOSIS — R55 Syncope and collapse: Secondary | ICD-10-CM | POA: Diagnosis not present

## 2018-10-06 ENCOUNTER — Ambulatory Visit (INDEPENDENT_AMBULATORY_CARE_PROVIDER_SITE_OTHER): Payer: PPO

## 2018-10-06 DIAGNOSIS — R55 Syncope and collapse: Secondary | ICD-10-CM

## 2018-10-22 ENCOUNTER — Telehealth: Payer: Self-pay | Admitting: Cardiology

## 2018-10-22 DIAGNOSIS — R55 Syncope and collapse: Secondary | ICD-10-CM | POA: Diagnosis not present

## 2018-10-22 NOTE — Telephone Encounter (Signed)
Monitor with 122 pauses, from 3 sec to 6.5 sec.  I called pt and he has had gray periods but no syncope, Dr. Radford Pax recommends he comes to ER but pt refuses.  I also recommended he not drive.  Will need to see EP tomorrow for further plans - I stopped his BB.  He is on due to aneurysm.

## 2018-10-22 NOTE — Telephone Encounter (Signed)
Strips from monitor with possible CHB strips are in the cath lab if not in computer

## 2018-10-23 ENCOUNTER — Ambulatory Visit: Payer: PPO | Admitting: Cardiology

## 2018-10-23 ENCOUNTER — Other Ambulatory Visit: Payer: Self-pay | Admitting: Cardiology

## 2018-10-23 ENCOUNTER — Other Ambulatory Visit: Payer: Self-pay

## 2018-10-23 ENCOUNTER — Encounter: Payer: Self-pay | Admitting: Cardiology

## 2018-10-23 VITALS — BP 130/80 | HR 72 | Ht 72.0 in | Wt 304.0 lb

## 2018-10-23 DIAGNOSIS — R55 Syncope and collapse: Secondary | ICD-10-CM | POA: Diagnosis not present

## 2018-10-23 NOTE — Progress Notes (Signed)
Electrophysiology Office Note   Date:  10/23/2018   ID:  EMRICK HENSCH, DOB 1945/05/22, MRN [id number]  PCP:  Lajean Manes, MD  Cardiologist:  Radford Pax Primary Electrophysiologist:  Shada Nienaber Meredith Leeds, MD    No chief complaint on file.    History of Present Illness: Clarence Andrade is a 73 y.o. male who is being seen today for the evaluation of heart block at the request of Cecilie Kicks. Presenting today for electrophysiology evaluation.  He has a history of mild aortic stenosis, coronary artery disease with CTO of the LAD, hyperlipidemia, OSA being referred for intermittent complete heart block.  He says that over the last few months, he is having gray out episodes.  He wore a cardiac monitor that showed intermittent complete heart block.  This is occurred only when he is sitting down, not when he is active.  This has not occurred when he is active or driving.  He was called last night and told to go to the emergency room which she declined.  His metoprolol was stopped last night.  Today, he denies symptoms of palpitations, chest pain, shortness of breath, orthopnea, PND, lower extremity edema, claudication, dizziness, presyncope, syncope, bleeding, or neurologic sequela. The patient is tolerating medications without difficulties.    Past Medical History:  Diagnosis Date  . Abdominal hernia    Midline  . Adrenal incidentaloma ([place]) 2010  . Aortic stenosis    mild by echo 02/2018  . Ascending aortic aneurysm ([place])    a. 4.4cm by imaging 03/2018.  Marland Kitchen CAD (coronary artery disease)    a. Cath 03/2018 - CTO of the prox-mid LAD with bridging, L-L and R-L collaterals, moderate, non-obstructive disease involving the proximal LAD, proximal LCx, and distal RCA, more severe disease noted involving small branch vessels (RV marginal and superior branch of OM3).  . Hyperlipidemia   . OSA (obstructive sleep apnea)    Severe PSG 1/07 AHI 66/hr, O2 Nadir 50% Refuses treatment - Pt does not  believe test was accurate  . Pericarditis    now on colchicine  . Pulmonary HTN ([place])    mild with PASP 48mmHg by echo 02/2018  . Screening for AAA (abdominal aortic aneurysm) 2010   CT Abd/Pelvis  . Ventral hernia    03/2009   Past Surgical History:  Procedure Laterality Date  . CORONARY ANGIOGRAPHY N/A 03/23/2018   Procedure: CORONARY ANGIOGRAPHY;  Surgeon: Nelva Bush, MD;  Location: [facility];  Service: Cardiovascular;  Laterality: N/A;  . HERNIA REPAIR  1/04  . KNEE ARTHROSCOPY Left 05/04  . TOTAL KNEE ARTHROPLASTY Bilateral 05/12  . VENTRAL HERNIA REPAIR  03/2009     Current Outpatient Medications  Medication Sig Dispense Refill  . Acetaminophen (TYLENOL PO) Take 500 mg by mouth every 6 (six) hours as needed.    Marland Kitchen aspirin EC 81 MG tablet Take 81 mg by mouth daily.    . cetirizine (ZYRTEC) 10 MG tablet Take 10 mg by mouth daily as needed for allergies.    . Coenzyme Q10 (CO Q 10) 100 MG CAPS Take 100 mg by mouth daily.    . isosorbide mononitrate (IMDUR) 30 MG 24 hr tablet Take 1 tablet (30 mg total) by mouth daily. 90 tablet 3  . Multiple Vitamin (MULTIVITAMIN) tablet Take 1 tablet by mouth daily.    . nitroGLYCERIN (NITROSTAT) 0.4 MG SL tablet Place 1 tablet (0.4 mg total) under the tongue every 5 (five) minutes as needed for chest pain.  25 tablet 3  . rosuvastatin (CRESTOR) 10 MG tablet Take 1 tablet (10 mg total) by mouth daily. 90 tablet 3   No current facility-administered medications for this visit.    Facility-Administered Medications Ordered in Other Visits  Medication Dose Route Frequency Provider Last Rate Last Dose  . technetium tetrofosmin (TC-MYOVIEW) injection 78.5 millicurie  88.5 millicurie Intravenous Once PRN Josue Hector, MD        Allergies:   Sertraline hcl   Social History:  The patient  reports that he has quit smoking. He has never used smokeless tobacco. He reports that he does not drink alcohol or use drugs.   Family History:   The patient's family history includes Aneurysm in his father; Heart attack in his father; Heart disease in his father.    ROS:  Please see the history of present illness.   Otherwise, review of systems is positive for near syncope.   All other systems are reviewed and negative.    PHYSICAL EXAM: VS:  BP 130/80   Pulse 72   Ht 6' (1.829 m)   Wt (!) 304 lb (137.9 kg)   BMI 41.23 kg/m  , BMI Body mass index is 41.23 kg/m. GEN: Well nourished, well developed, in no acute distress  HEENT: normal  Neck: no JVD, carotid bruits, or masses Cardiac: RRR; no murmurs, rubs, or gallops,no edema  Respiratory:  clear to auscultation bilaterally, normal work of breathing GI: soft, nontender, nondistended, + BS MS: no deformity or atrophy  Skin: warm and dry Neuro:  Strength and sensation are intact Psych: euthymic mood, full affect  EKG:  EKG is ordered today. Personal review of the ekg ordered shows sinus rhythm, first-degree AV block   Recent Labs: 05/22/2018: ALT 22 07/05/2018: BUN 14; Creatinine, Ser 0.82; Potassium 4.5; Sodium 140 10/02/2018: Hemoglobin 15.7; NT-Pro BNP 240; Platelets 190; TSH 2.240    Lipid Panel     Component Value Date/Time   CHOL 130 09/14/2018 0853   TRIG 84 09/14/2018 0853   HDL 49 09/14/2018 0853   CHOLHDL 2.7 09/14/2018 0853   CHOLHDL 3.2 06/05/2010 0715   VLDL 10 06/05/2010 0715   LDLCALC 64 09/14/2018 0853     Wt Readings from Last 3 Encounters:  10/23/18 (!) 304 lb (137.9 kg)  10/02/18 (!) 307 lb (139.3 kg)  07/05/18 298 lb (135.2 kg)      Other studies Reviewed: Additional studies/ records that were reviewed today include: TTE 03/08/2018 Review of the above records today demonstrates:  Left ventricle: The cavity size was normal. There was severe   concentric hypertrophy. Systolic function was normal. The   estimated ejection fraction was in the range of 55% to 60%. Wall   motion was normal; there were no regional wall motion    abnormalities. There was an increased relative contribution of   atrial contraction to ventricular filling. Doppler parameters are   consistent with abnormal left ventricular relaxation (grade 1   diastolic dysfunction). - Aortic valve: Moderately calcified annulus. Trileaflet; mildly   thickened, mildly calcified leaflets. There was mild stenosis.   There was trivial regurgitation. Mean gradient (S): 15 mm Hg.   Valve area (VTI): 1.53 cm^2. Valve area (Vmax): 1.48 cm^2. Valve   area (Vmean): 1.46 cm^2. - Aorta: Ascending aorta diameter: 41 mm (ED). - Ascending aorta: The ascending aorta was mildly dilated. - Mitral valve: Calcified annulus. There was mild regurgitation. - Left atrium: The atrium was mildly dilated. - Pulmonic valve: There was mild  regurgitation. - Pulmonary arteries: PA peak pressure: 39 mm Hg (S).  LHC 03/23/18 1. Severe single-vessel coronary artery disease, with chronic total occlusion of the proximal to mid LAD with bridging, left-to-left, and right-to-left collaterals. 2. Moderate, non-obstructive disease involving the proximal LAD, proximal LCx, and distal RCA.  More severe disease noted involving small branch vessels (RV marginal and superior branch of OM3). 3. Inability to cross the aortic valve for left heart catheterization due to insufficient catheter length complicated by tortuous subclavian artery and dilated ascending aorta.  LVEF noted to be normal on recent echocardiogram. ASSESSMENT AND PLAN:  1.  Intermittent complete heart block: Patient has been having gray out episodes with near syncope.  His Toprol-XL was stopped last night.  Due to that, we Jashun Puertas continue to monitor off of rate controlling medications.  I told him that if he has continued near syncopal episodes towards the end of this week, he would need a pacemaker insertion.  We Santrice Muzio touch base with him early next week to see how he is doing.  2.  Coronary artery disease: CTO of the LAD.  No current  chest pain.  3.  Hyperlipidemia: Goal below 70.  Continue Crestor    Current medicines are reviewed at length with the patient today.   The patient does not have concerns regarding his medicines.  The following changes were made today:  none  Labs/ tests ordered today include:  No orders of the defined types were placed in this encounter.  Case discussed with primary cardiology  Disposition:   FU with Arman Loy 3 months  Signed, Kaidan Spengler Meredith Leeds, MD  10/23/2018 4:18 PM     [address] [place] [address] [phone] (office) [phone] (fax)

## 2018-10-23 NOTE — Telephone Encounter (Signed)
Pt scheduled this afternoon w/ [place] to discuss further.

## 2018-10-23 NOTE — Telephone Encounter (Signed)
lmtcb to see if pt can come in this afternoon to discuss w/ Dr. Curt Bears.

## 2018-10-23 NOTE — Telephone Encounter (Signed)
Pt scheduled w/ Dr. Curt Bears this afternoon to discuss. Pt agreeable to plan.

## 2018-10-29 ENCOUNTER — Telehealth: Payer: Self-pay | Admitting: Cardiology

## 2018-10-29 NOTE — Telephone Encounter (Signed)
New Message    Patient is calling to see about scheduling for a pacemaker. Please call to discuss.

## 2018-10-29 NOTE — Telephone Encounter (Signed)
Pt reports still having symptoms. States that he will go a day/two with not very many symptoms, the then he will go a day/two with symptoms.  "they come in bunches and then they stop". Denies syncopal events, only reports continued near syncope. Pt would like to go ahead and schedule PPM implant for next Thursday.  Pt aware I will make arrangement after approval from [facility]. Pt aware I will follow up by end of week to arrange procedure and COVID screening for next week. Pt scheduled for Monday virtual visit to go over procedure/complication risks/etc w/ Dr. Curt Bears. Patient verbalized understanding and agreeable to plan.

## 2018-11-02 NOTE — Telephone Encounter (Addendum)
Spoke to wife (ok per pt). PPM implant scheduled for 7/2  Procedure instructions reviewed. Aware to arrive to [facility] at 9:30 am.  NPO after MN.  Hold morning medications. Device will be MDT MRI compatible. COVID screening scheduled for 6/29. Aware office will contact to arrange post procedure follow up. Wife verbalized understanding and agreeable to plan.      Virtual Visit Pre-Appointment Phone Call  "(Name), I am calling you today to discuss your upcoming appointment. We are currently trying to limit exposure to the virus that causes COVID-19 by seeing patients at home rather than in the office."  1. "What is the BEST phone number to call the day of the visit?" - include this in appointment notes  2. "Do you have or have access to (through a family member/friend) a smartphone with video capability that we can use for your visit?" a. If yes - list this number in appt notes as "cell" (if different from BEST phone #) and list the appointment type as a VIDEO visit in appointment notes b. If no - list the appointment type as a PHONE visit in appointment notes  3. Confirm consent - "In the setting of the current Covid19 crisis, you are scheduled for a (phone or video) visit with your provider on (date) at (time).  Just as we do with many in-office visits, in order for you to participate in this visit, we must obtain consent.  If you'd like, I can send this to your mychart (if signed up) or email for you to review.  Otherwise, I can obtain your verbal consent now.  All virtual visits are billed to your insurance company just like a normal visit would be.  By agreeing to a virtual visit, we'd like you to understand that the technology does not allow for your provider to perform an examination, and thus may limit your provider's ability to fully assess your condition. If your provider identifies any concerns that need to be evaluated in person, we will make arrangements to do so.  Finally, though the  technology is pretty good, we cannot assure that it will always work on either your or our end, and in the setting of a video visit, we may have to convert it to a phone-only visit.  In either situation, we cannot ensure that we have a secure connection.  Are you willing to proceed?" STAFF: Did the patient verbally acknowledge consent to telehealth visit? Document YES/NO here: YES  4. Advise patient to be prepared - "Two hours prior to your appointment, go ahead and check your blood pressure, pulse, oxygen saturation, and your weight (if you have the equipment to check those) and write them all down. When your visit starts, your provider will ask you for this information. If you have an Apple Watch or Kardia device, please plan to have heart rate information ready on the day of your appointment. Please have a pen and paper handy nearby the day of the visit as well."  5. Give patient instructions for MyChart download to smartphone OR Doximity/Doxy.me as below if video visit (depending on what platform provider is using)  6. Inform patient they will receive a phone call 15 minutes prior to their appointment time (may be from unknown caller ID) so they should be prepared to answer    TELEPHONE CALL NOTE  Clarence Andrade has been deemed a candidate for a follow-up tele-health visit to limit community exposure during the Covid-19 pandemic. I spoke with the patient  via phone to ensure availability of phone/video source, confirm preferred email & phone number, and discuss instructions and expectations.  I reminded Clarence Andrade to be prepared with any vital sign and/or heart rhythm information that could potentially be obtained via home monitoring, at the time of his visit. I reminded Clarence Andrade to expect a phone call prior to his visit.  [name], RN 11/04/2018 1:57 PM   INSTRUCTIONS FOR DOWNLOADING THE MYCHART APP TO SMARTPHONE  - The patient must first make sure to have activated  MyChart and know their login information - If Apple, go to [employer] and type in MyChart in the search bar and download the app. If Android, ask patient to go to [employer] and type in [place] in the search bar and download the app. The app is free but as with any other app downloads, their phone may require them to verify saved payment information or Apple/Android password.  - The patient will need to then log into the app with their MyChart username and password, and select [facility] as their healthcare provider to link the account. When it is time for your visit, go to the MyChart app, find appointments, and click Begin Video Visit. Be sure to Select Allow for your device to access the Microphone and Camera for your visit. You will then be connected, and your provider will be with you shortly.  **If they have any issues connecting, or need assistance please contact MyChart service desk [phone])**  **If using a computer, in order to ensure the best quality for their visit they will need to use either of the following Internet Browsers: [employer], or Google Chrome**  IF USING DOXIMITY or DOXY.ME - The patient will receive a link just prior to their visit by text.     FULL LENGTH CONSENT FOR TELE-HEALTH VISIT   I hereby voluntarily request, consent and authorize [facility] and its employed or contracted physicians, physician assistants, nurse practitioners or other licensed health care professionals (the Practitioner), to provide me with telemedicine health care services (the "Services") as deemed necessary by the treating Practitioner. I acknowledge and consent to receive the Services by the Practitioner via telemedicine. I understand that the telemedicine visit will involve communicating with the Practitioner through live audiovisual communication technology and the disclosure of certain medical information by electronic transmission. I acknowledge that I have  been given the opportunity to request an in-person assessment or other available alternative prior to the telemedicine visit and am voluntarily participating in the telemedicine visit.  I understand that I have the right to withhold or withdraw my consent to the use of telemedicine in the course of my care at any time, without affecting my right to future care or treatment, and that the Practitioner or I may terminate the telemedicine visit at any time. I understand that I have the right to inspect all information obtained and/or recorded in the course of the telemedicine visit and may receive copies of available information for a reasonable fee.  I understand that some of the potential risks of receiving the Services via telemedicine include:  Marland Kitchen Delay or interruption in medical evaluation due to technological equipment failure or disruption; . Information transmitted may not be sufficient (e.g. poor resolution of images) to allow for appropriate medical decision making by the Practitioner; and/or  . In rare instances, security protocols could fail, causing a breach of personal health information.  Furthermore, I acknowledge that it is  my responsibility to provide information about my medical history, conditions and care that is complete and accurate to the best of my ability. I acknowledge that Practitioner's advice, recommendations, and/or decision may be based on factors not within their control, such as incomplete or inaccurate data provided by me or distortions of diagnostic images or specimens that may result from electronic transmissions. I understand that the practice of medicine is not an exact science and that Practitioner makes no warranties or guarantees regarding treatment outcomes. I acknowledge that I will receive a copy of this consent concurrently upon execution via email to the email address I last provided but may also request a printed copy by calling the office of [facility].    I  understand that my insurance will be billed for this visit.   I have read or had this consent read to me. . I understand the contents of this consent, which adequately explains the benefits and risks of the Services being provided via telemedicine.  . I have been provided ample opportunity to ask questions regarding this consent and the Services and have had my questions answered to my satisfaction. . I give my informed consent for the services to be provided through the use of telemedicine in my medical care  By participating in this telemedicine visit I agree to the above.

## 2018-11-05 ENCOUNTER — Telehealth (INDEPENDENT_AMBULATORY_CARE_PROVIDER_SITE_OTHER): Payer: PPO | Admitting: Cardiology

## 2018-11-05 ENCOUNTER — Other Ambulatory Visit (HOSPITAL_COMMUNITY)
Admission: RE | Admit: 2018-11-05 | Discharge: 2018-11-05 | Disposition: A | Discharge status: Home / Self Care | Payer: PPO | Source: Ambulatory Visit | Attending: Cardiology | Admitting: Cardiology

## 2018-11-05 DIAGNOSIS — I442 Atrioventricular block, complete: Secondary | ICD-10-CM

## 2018-11-05 DIAGNOSIS — Z1159 Encounter for screening for other viral diseases: Secondary | ICD-10-CM | POA: Diagnosis not present

## 2018-11-05 DIAGNOSIS — Z01812 Encounter for preprocedural laboratory examination: Secondary | ICD-10-CM | POA: Insufficient documentation

## 2018-11-05 LAB — SARS CORONAVIRUS 2 (TAT 6-24 HRS): SARS Coronavirus 2: NEGATIVE

## 2018-11-05 NOTE — Progress Notes (Signed)
Electrophysiology TeleHealth Note   Due to national recommendations of social distancing due to COVID 19, an audio/video telehealth visit is felt to be most appropriate for this patient at this time.  See Epic message for the patient's consent to telehealth for [facility].   Date:  11/05/2018   ID:  [name], DOB Oct 29, 1945, MRN [id number]  Location: patient's home  Provider location: [address], [place]  Evaluation Performed: Follow-up visit  PCP:  Lajean Manes, MD  Cardiologist:  Fransico Him, MD  Electrophysiologist:  Dr Curt Bears  Chief Complaint: Heart block  History of Present Illness:    Clarence Andrade is a 73 y.o. male who presents via audio/video conferencing for a telehealth visit today.  Since last being seen in our clinic, the patient reports doing very well.  Today, he denies symptoms of palpitations, chest pain, shortness of breath,  lower extremity edema, dizziness, presyncope, or syncope.  The patient is otherwise without complaint today.  The patient denies symptoms of fevers, chills, cough, or new SOB worrisome for COVID 19.  He has a history of aortic stenosis, coronary artery disease, hyperlipidemia, OSA, and complete heart block.  He has plan for pacemaker insertion7/06/2018.  Today, denies symptoms of palpitations, chest pain, shortness of breath, orthopnea, PND, lower extremity edema, claudication, dizziness, presyncope, syncope, bleeding, or neurologic sequela. The patient is tolerating medications without difficulties.  Overall he is doing well.  He has continued to have his symptoms of near blackouts mainly when he is at rest.  These have not yet happened when he is exerting himself.  Past Medical History:  Diagnosis Date  . Abdominal hernia    Midline  . Adrenal incidentaloma ([place]) 2010  . Aortic stenosis    mild by echo 02/2018  . Ascending aortic aneurysm ([place])    a. 4.4cm by imaging 03/2018.  Marland Kitchen CAD (coronary artery  disease)    a. Cath 03/2018 - CTO of the prox-mid LAD with bridging, L-L and R-L collaterals, moderate, non-obstructive disease involving the proximal LAD, proximal LCx, and distal RCA, more severe disease noted involving small branch vessels (RV marginal and superior branch of OM3).  . Hyperlipidemia   . OSA (obstructive sleep apnea)    Severe PSG 1/07 AHI 66/hr, O2 Nadir 50% Refuses treatment - Pt does not believe test was accurate  . Pericarditis    now on colchicine  . Pulmonary HTN ([place])    mild with PASP 7mmHg by echo 02/2018  . Screening for AAA (abdominal aortic aneurysm) 2010   CT Abd/Pelvis  . Ventral hernia    03/2009    Past Surgical History:  Procedure Laterality Date  . CORONARY ANGIOGRAPHY N/A 03/23/2018   Procedure: CORONARY ANGIOGRAPHY;  Surgeon: Nelva Bush, MD;  Location: [facility];  Service: Cardiovascular;  Laterality: N/A;  . HERNIA REPAIR  1/04  . KNEE ARTHROSCOPY Left 05/04  . TOTAL KNEE ARTHROPLASTY Bilateral 05/12  . VENTRAL HERNIA REPAIR  03/2009    Current Outpatient Medications  Medication Sig Dispense Refill  . acetaminophen (TYLENOL) 500 MG tablet Take 1,000 mg by mouth every 6 (six) hours as needed (pain).    Marland Kitchen aspirin EC 81 MG tablet Take 81 mg by mouth daily.    . cetirizine (ZYRTEC) 10 MG tablet Take 10 mg by mouth daily as needed for allergies.    . Coenzyme Q10 (CO Q 10) 100 MG CAPS Take 100 mg by mouth daily.    Marland Kitchen  isosorbide mononitrate (IMDUR) 30 MG 24 hr tablet Take 1 tablet (30 mg total) by mouth daily. 90 tablet 3  . Multiple Vitamin (MULTIVITAMIN) tablet Take 1 tablet by mouth daily.    . nitroGLYCERIN (NITROSTAT) 0.4 MG SL tablet Place 1 tablet (0.4 mg total) under the tongue every 5 (five) minutes as needed for chest pain. 25 tablet 3  . rosuvastatin (CRESTOR) 10 MG tablet Take 1 tablet (10 mg total) by mouth daily. 90 tablet 3   No current facility-administered medications for this visit.    Facility-Administered  Medications Ordered in Other Visits  Medication Dose Route Frequency Provider Last Rate Last Dose  . technetium tetrofosmin (TC-MYOVIEW) injection 99.2 millicurie  42.6 millicurie Intravenous Once PRN Josue Hector, MD        Allergies:   Sertraline hcl   Social History:  The patient  reports that he has quit smoking. He has never used smokeless tobacco. He reports that he does not drink alcohol or use drugs.   Family History:  The patient's  family history includes Aneurysm in his father; Heart attack in his father; Heart disease in his father.   ROS:  Please see the history of present illness.   All other systems are personally reviewed and negative.    Exam:    Vital Signs:  BP 125/85   Pulse 70   no acute distress, no shortness of breath.  Labs/Other Tests and Data Reviewed:    Recent Labs: 05/22/2018: ALT 22 07/05/2018: BUN 14; Creatinine, Ser 0.82; Potassium 4.5; Sodium 140 10/02/2018: Hemoglobin 15.7; NT-Pro BNP 240; Platelets 190; TSH 2.240   Wt Readings from Last 3 Encounters:  10/23/18 (!) 304 lb (137.9 kg)  10/02/18 (!) 307 lb (139.3 kg)  07/05/18 298 lb (135.2 kg)     Other studies personally reviewed: Additional studies/ records that were reviewed today include: ECG 10/23/2018 personally reviewed Review of the above records today demonstrates: Sinus rhythm, first-degree AV block   ASSESSMENT & PLAN:    1.  Intermittent complete heart block: Has been prescribed Toprol-XL which has since been stopped.  He has continued to have episodes of weakness and fatigue.  Due to that, he would prefer to have pacemaker insertion.  Risks and benefits were discussed include bleeding, tamponade, infection, pneumothorax.  The patient understands these risks and is agreed to the procedure.  2.  Coronary artery disease: Has LAD CTO.  Currently no chest pain.  3.  Hyperlipidemia: Goal LDL below 70.  Continue Crestor.   COVID 19 screen The patient denies symptoms of COVID 19 at  this time.  The importance of social distancing was discussed today.  Follow-up: 3 months  Current medicines are reviewed at length with the patient today.   The patient does not have concerns regarding his medicines.  The following changes were made today:  none  Labs/ tests ordered today include:  No orders of the defined types were placed in this encounter.    Patient Risk:  after full review of this patients clinical status, I feel that they are at moderate risk at this time.  Today, I have spent 14 minutes with the patient with telehealth technology discussing pacemaker.    Signed, Special Ranes Meredith Leeds, MD  11/05/2018 10:49 AM     [facility] [address] [place] [address] [phone] (office) [phone] (fax)

## 2018-11-08 ENCOUNTER — Other Ambulatory Visit: Payer: Self-pay

## 2018-11-08 ENCOUNTER — Ambulatory Visit (HOSPITAL_COMMUNITY)
Admission: RE | Disposition: A | Discharge status: Home / Self Care | Payer: Self-pay | Source: Home / Self Care | Attending: Cardiology

## 2018-11-08 ENCOUNTER — Ambulatory Visit (HOSPITAL_COMMUNITY)
Admission: RE | Admit: 2018-11-08 | Discharge: 2018-11-08 | Disposition: A | Discharge status: Home / Self Care | Payer: PPO | Attending: Cardiology | Admitting: Cardiology

## 2018-11-08 ENCOUNTER — Ambulatory Visit (HOSPITAL_COMMUNITY): Payer: PPO

## 2018-11-08 DIAGNOSIS — I442 Atrioventricular block, complete: Secondary | ICD-10-CM | POA: Diagnosis not present

## 2018-11-08 DIAGNOSIS — Z87891 Personal history of nicotine dependence: Secondary | ICD-10-CM | POA: Diagnosis not present

## 2018-11-08 DIAGNOSIS — I2510 Atherosclerotic heart disease of native coronary artery without angina pectoris: Secondary | ICD-10-CM | POA: Insufficient documentation

## 2018-11-08 DIAGNOSIS — I714 Abdominal aortic aneurysm, without rupture: Secondary | ICD-10-CM | POA: Insufficient documentation

## 2018-11-08 DIAGNOSIS — G4733 Obstructive sleep apnea (adult) (pediatric): Secondary | ICD-10-CM | POA: Insufficient documentation

## 2018-11-08 DIAGNOSIS — E785 Hyperlipidemia, unspecified: Secondary | ICD-10-CM | POA: Diagnosis not present

## 2018-11-08 DIAGNOSIS — Z8249 Family history of ischemic heart disease and other diseases of the circulatory system: Secondary | ICD-10-CM | POA: Diagnosis not present

## 2018-11-08 DIAGNOSIS — Z7982 Long term (current) use of aspirin: Secondary | ICD-10-CM | POA: Diagnosis not present

## 2018-11-08 DIAGNOSIS — I272 Pulmonary hypertension, unspecified: Secondary | ICD-10-CM | POA: Diagnosis not present

## 2018-11-08 DIAGNOSIS — Z96653 Presence of artificial knee joint, bilateral: Secondary | ICD-10-CM | POA: Insufficient documentation

## 2018-11-08 DIAGNOSIS — Z79899 Other long term (current) drug therapy: Secondary | ICD-10-CM | POA: Insufficient documentation

## 2018-11-08 DIAGNOSIS — Z95818 Presence of other cardiac implants and grafts: Secondary | ICD-10-CM

## 2018-11-08 DIAGNOSIS — Z950 Presence of cardiac pacemaker: Secondary | ICD-10-CM | POA: Diagnosis not present

## 2018-11-08 HISTORY — PX: PACEMAKER IMPLANT: EP1218

## 2018-11-08 LAB — CBC
HCT: 49.1 % (ref 39.0–52.0)
Hemoglobin: 16.5 g/dL (ref 13.0–17.0)
MCH: 32 pg (ref 26.0–34.0)
MCHC: 33.6 g/dL (ref 30.0–36.0)
MCV: 95.2 fL (ref 80.0–100.0)
Platelets: 187 10*3/uL (ref 150–400)
RBC: 5.16 MIL/uL (ref 4.22–5.81)
RDW: 12.7 % (ref 11.5–15.5)
WBC: 8.1 10*3/uL (ref 4.0–10.5)
nRBC: 0 % (ref 0.0–0.2)

## 2018-11-08 LAB — BASIC METABOLIC PANEL
Anion gap: 8 (ref 5–15)
BUN: 19 mg/dL (ref 8–23)
CO2: 26 mmol/L (ref 22–32)
Calcium: 8.8 mg/dL — ABNORMAL LOW (ref 8.9–10.3)
Chloride: 105 mmol/L (ref 98–111)
Creatinine, Ser: 0.79 mg/dL (ref 0.61–1.24)
GFR calc Af Amer: >60 mL/min (ref >60)
GFR calc non Af Amer: >60 mL/min (ref >60)
Glucose, Bld: 97 mg/dL (ref 70–99)
Potassium: 4.3 mmol/L (ref 3.5–5.1)
Sodium: 139 mmol/L (ref 135–145)

## 2018-11-08 LAB — SURGICAL PCR SCREEN
MRSA, PCR: NEGATIVE
Staphylococcus aureus: NEGATIVE

## 2018-11-08 SURGERY — PACEMAKER IMPLANT

## 2018-11-08 MED ORDER — HEPARIN (PORCINE) IN NACL 1000-0.9 UT/500ML-% IV SOLN
INTRAVENOUS | Status: AC
  Filled 2018-11-08: qty 500

## 2018-11-08 MED ORDER — CEFAZOLIN SODIUM-DEXTROSE 1-4 GM/50ML-% IV SOLN: 1.0000 g | Freq: Four times a day (QID) | INTRAVENOUS | Status: DC

## 2018-11-08 MED ORDER — FENTANYL CITRATE (PF) 100 MCG/2ML IJ SOLN
INTRAMUSCULAR | Status: AC
  Filled 2018-11-08: qty 2

## 2018-11-08 MED ORDER — MUPIROCIN 2 % EX OINT
TOPICAL_OINTMENT | CUTANEOUS | Status: AC
  Administered 2018-11-08: 1
  Filled 2018-11-08: qty 22

## 2018-11-08 MED ORDER — MIDAZOLAM HCL 5 MG/5ML IJ SOLN
INTRAMUSCULAR | Status: DC | PRN
  Administered 2018-11-08 (×2): 1 mg via INTRAVENOUS

## 2018-11-08 MED ORDER — FENTANYL CITRATE (PF) 100 MCG/2ML IJ SOLN
INTRAMUSCULAR | Status: DC | PRN
  Administered 2018-11-08 (×2): 25 ug via INTRAVENOUS

## 2018-11-08 MED ORDER — ACETAMINOPHEN 325 MG PO TABS: 325.0000 mg | ORAL_TABLET | ORAL | Status: DC | PRN

## 2018-11-08 MED ORDER — ONDANSETRON HCL 4 MG/2ML IJ SOLN: 4.0000 mg | Freq: Four times a day (QID) | INTRAMUSCULAR | Status: DC | PRN

## 2018-11-08 MED ORDER — CHLORHEXIDINE GLUCONATE 4 % EX LIQD
60.0000 mL | Freq: Once | CUTANEOUS | Status: DC
  Filled 2018-11-08: qty 60

## 2018-11-08 MED ORDER — HEPARIN (PORCINE) IN NACL 1000-0.9 UT/500ML-% IV SOLN
INTRAVENOUS | Status: DC | PRN
  Administered 2018-11-08: 500 mL

## 2018-11-08 MED ORDER — SODIUM CHLORIDE 0.9 % IV SOLN
80.0000 mg | INTRAVENOUS | Status: AC
  Administered 2018-11-08: 80 mg

## 2018-11-08 MED ORDER — IOHEXOL 350 MG/ML SOLN
INTRAVENOUS | Status: DC | PRN
  Administered 2018-11-08: 14:00:00 15 mL via INTRAVENOUS

## 2018-11-08 MED ORDER — DEXTROSE 5 % IV SOLN
3.0000 g | INTRAVENOUS | Status: AC
  Administered 2018-11-08: 3 g via INTRAVENOUS
  Filled 2018-11-08: qty 3000

## 2018-11-08 MED ORDER — MIDAZOLAM HCL 5 MG/5ML IJ SOLN
INTRAMUSCULAR | Status: AC
  Filled 2018-11-08: qty 5

## 2018-11-08 MED ORDER — LIDOCAINE HCL 1 % IJ SOLN
INTRAMUSCULAR | Status: AC
  Filled 2018-11-08: qty 60

## 2018-11-08 MED ORDER — SODIUM CHLORIDE 0.9 % IV SOLN
INTRAVENOUS | Status: DC
  Administered 2018-11-08: 11:00:00 via INTRAVENOUS

## 2018-11-08 MED ORDER — SODIUM CHLORIDE 0.9 % IV SOLN
INTRAVENOUS | Status: AC
  Filled 2018-11-08: qty 2

## 2018-11-08 MED ORDER — LIDOCAINE HCL (PF) 1 % IJ SOLN
INTRAMUSCULAR | Status: DC | PRN
  Administered 2018-11-08: 60 mL

## 2018-11-08 SURGICAL SUPPLY — 8 items
CABLE SURGICAL S-101-97-12 (CABLE) ×2 IMPLANT
IPG PACE AZUR XT DR MRI W1DR01 (Pacemaker) IMPLANT
LEAD CAPSURE NOVUS 5076-52CM (Lead) ×1 IMPLANT
LEAD CAPSURE NOVUS 5076-58CM (Lead) ×1 IMPLANT
PACE AZURE XT DR MRI W1DR01 (Pacemaker) ×2 IMPLANT
PAD PRO RADIOLUCENT 2001M-C (PAD) ×2 IMPLANT
SHEATH CLASSIC 7F (SHEATH) ×2 IMPLANT
TRAY PACEMAKER INSERTION (PACKS) ×2 IMPLANT

## 2018-11-08 NOTE — Discharge Instructions (Signed)
Tomorrow, PLEASE SEND A REMOTE DEVICE TRANSMISSION  and  PLEASE SIGN UP TO YOUR MY CHART ACCOUNT WHEN YOU GET HOME (if you aren't already).  IN THE CURRENT ENVIRONMENT WITH COVID-19, IN EFFORT TO REDUCE YOUR EXPOSURE WE WILL BE CONDUCTING MANY PATIENT VISITS BY EITHER VIRTUAL/VIDEO VISITS or TELEPHONE VISITS.  BEING SIGNED UP IN YOUR MY CHART ACCOUNT  WILL HELP FACILITATE THESE VISITS AND OUR COMMUNICATION WITH YOU    ACTIVITY: No heavy lifting or vigorous activity with your left arm for 6-8weeks ; do not raise your left arm above your head for one week; gradually raise your affected arm as drawn below                 11/12/2018                  11/13/2018                   11/14/2018                11/15/2018 __ NO DRIVING until cleared to at your wound check visit  Keep the wound area clean and dry. Do not get this area wet, no showers until cleared to at your wound check visit  You can stop wearing the arm sling tomorrow Remove the outer dressing tomorrow morning; the paper tapes/steri strips underneath stay in place; they will fall off; do not pull them off. No bandage is needed on the site;DO NOT apply and creams,oils or ointments to the wound area; If you notice any drainage or discharge from the wound, any swelling, or bruising at the site, if you develop a fever of 101 after you are discharged call the office at once;

## 2018-11-08 NOTE — Progress Notes (Signed)
CXR done and Dr Curt Bears notified and per Dr Curt Bears okay to d/c home

## 2018-11-08 NOTE — H&P (Signed)
Clarence Andrade has presented today for surgery, with the diagnosis of complete heart block.  The various methods of treatment have been discussed with the patient and family. After consideration of risks, benefits and other options for treatment, the patient has consented to  Procedure(s): Pacemaker implant as a surgical intervention .  Risks include but not limited to bleeding, tamponade, infection, pneumothorax, among others. The patient's history has been reviewed, patient examined, no change in status, stable for surgery.  I have reviewed the patient's chart and labs.  Questions were answered to the patient's satisfaction.    Secundino Ellithorpe Curt Bears, MD 11/08/2018 12:18 PM

## 2018-11-12 ENCOUNTER — Encounter (HOSPITAL_COMMUNITY): Payer: Self-pay | Admitting: Cardiology

## 2018-11-12 MED FILL — Lidocaine HCl Local Inj 1%: INTRAMUSCULAR | Qty: 60 | Status: AC

## 2018-11-16 ENCOUNTER — Encounter (HOSPITAL_COMMUNITY): Payer: Self-pay | Admitting: Cardiology

## 2018-11-21 ENCOUNTER — Telehealth: Payer: Self-pay

## 2018-11-21 NOTE — Telephone Encounter (Signed)
    COVID-19 Pre-Screening Questions:  . In the past 7 to 10 days have you had a cough,  shortness of breath, headache, congestion, fever (100 or greater) body aches, chills, sore throat, or sudden loss of taste or sense of smell? No . Have you been around anyone with known Covid 19. No . Have you been around anyone who is awaiting Covid 19 test results in the past 7 to 10 days? No . Have you been around anyone who has been exposed to Covid 19, or has mentioned symptoms of Covid 19 within the past 7 to 10 days? No  If you have any concerns/questions about symptoms patients report during screening (either on the phone or at threshold). Contact the provider seeing the patient or DOD for further guidance.  If neither are available contact a member of the leadership team.        Pt answered No to all covid-19 prescreening questions. I asked the pt to wear a mask to his appointment. I also told the pt we are trying to reduce the number of people coming into the office and if he can physically come to his appointment alone to please do so. The pt verbalized understanding.

## 2018-11-22 ENCOUNTER — Ambulatory Visit (INDEPENDENT_AMBULATORY_CARE_PROVIDER_SITE_OTHER): Payer: PPO | Admitting: *Deleted

## 2018-11-22 ENCOUNTER — Other Ambulatory Visit: Payer: Self-pay

## 2018-11-22 DIAGNOSIS — R55 Syncope and collapse: Secondary | ICD-10-CM | POA: Diagnosis not present

## 2018-11-22 LAB — CUP PACEART INCLINIC DEVICE CHECK
Date Time Interrogation Session: 20200716185347
Implantable Lead Implant Date: 20200702
Implantable Lead Implant Date: 20200702
Implantable Lead Location: 753859
Implantable Lead Location: 753860
Implantable Lead Model: 5076
Implantable Lead Model: 5076
Implantable Pulse Generator Implant Date: 20200702

## 2018-11-22 NOTE — Progress Notes (Signed)
Device function checked by industry. Normal device function. Steri-strips removed. Wound edges approcimated, no drainage redness or edema. Education done on s/sx of infection. Remote f/u 02/07/19. F/u with Dr Curt Bears 02/11/19.

## 2018-12-31 ENCOUNTER — Other Ambulatory Visit: Payer: Self-pay | Admitting: *Deleted

## 2019-01-02 ENCOUNTER — Other Ambulatory Visit: Payer: Self-pay

## 2019-01-02 ENCOUNTER — Encounter: Payer: Self-pay | Admitting: Cardiology

## 2019-01-02 ENCOUNTER — Telehealth (INDEPENDENT_AMBULATORY_CARE_PROVIDER_SITE_OTHER): Payer: PPO | Admitting: Cardiology

## 2019-01-02 DIAGNOSIS — E7800 Pure hypercholesterolemia, unspecified: Secondary | ICD-10-CM

## 2019-01-02 DIAGNOSIS — I350 Nonrheumatic aortic (valve) stenosis: Secondary | ICD-10-CM | POA: Diagnosis not present

## 2019-01-02 DIAGNOSIS — I712 Thoracic aortic aneurysm, without rupture, unspecified: Secondary | ICD-10-CM

## 2019-01-02 DIAGNOSIS — I2510 Atherosclerotic heart disease of native coronary artery without angina pectoris: Secondary | ICD-10-CM | POA: Diagnosis not present

## 2019-01-02 DIAGNOSIS — I10 Essential (primary) hypertension: Secondary | ICD-10-CM

## 2019-01-02 NOTE — Patient Instructions (Addendum)
Medication Instructions:  Your physician recommends that you continue on your current medications as directed. Please refer to the Current Medication list given to you today.  If you need a refill on your cardiac medications before your next appointment, please call your pharmacy.   Lab work: FUTURE: BMET to be done on 03/04/2019   If you have labs (blood work) drawn today and your tests are completely normal, you will receive your results only by: Marland Kitchen MyChart Message (if you have MyChart) OR . A paper copy in the mail If you have any lab test that is abnormal or we need to change your treatment, we will call you to review the results.  Testing/Procedures: Your physician has ordered for you to have a chest CT angio ON 03/11/2019 @ 2:30 PM  Follow-Up: At [facility], you and your health needs are our priority.  As part of our continuing mission to provide you with exceptional heart care, we have created designated Provider Care Teams.  These Care Teams include your primary Cardiologist (physician) and Advanced Practice Providers (APPs -  Physician Assistants and Nurse Practitioners) who all work together to provide you with the care you need, when you need it. You will need a follow up appointment in 6 months.  Please call our office 2 months in advance to schedule this appointment.  You may see Fransico Him, MD or one of the following Advanced Practice Providers on your designated Care Team:   [place], PA-C Melina Copa, PA-C . Ermalinda Barrios, PA-C  Any Other Special Instructions Will Be Listed Below (If Applicable).

## 2019-01-02 NOTE — Progress Notes (Signed)
Virtual Visit via Telephone Note   This visit type was conducted due to national recommendations for restrictions regarding the COVID-19 Pandemic (e.g. social distancing) in an effort to limit this patient's exposure and mitigate transmission in our community.  Due to his co-morbid illnesses, this patient is at least at moderate risk for complications without adequate follow up.  This format is felt to be most appropriate for this patient at this time.  The patient did not have access to video technology/had technical difficulties with video requiring transitioning to audio format only (telephone).  All issues noted in this document were discussed and addressed.  No physical exam could be performed with this format.  Please refer to the patient's chart for his  consent to telehealth for [facility].  Evaluation Performed:  Follow-up visit  This visit type was conducted due to national recommendations for restrictions regarding the COVID-19 Pandemic (e.g. social distancing).  This format is felt to be most appropriate for this patient at this time.  All issues noted in this document were discussed and addressed.  No physical exam was performed (except for noted visual exam findings with Video Visits).  Please refer to the patient's chart (MyChart message for video visits and phone note for telephone visits) for the patient's consent to telehealth for [facility].  Date:  01/02/2019   ID:  Clarence Andrade, DOB 20-Jan-1946, MRN [id number]  Patient Location:  Home  Provider location:   [place]  PCP:  Lajean Manes, MD  Cardiologist:  Fransico Him, MD  Electrophysiologist:  Constance Haw, MD   Chief Complaint:  Coronary artery calcifications and   History of Present Illness:    Clarence Andrade is a 73 y.o. male who presents via audio/video conferencing for a telehealth visit today.    This is a 73yo male with a hx of mild AS, Ascending aortic aneurysm at 4.4cm by echo 03/2018,  ASCAD with CTO of the prox-mid LAD with bridging L>L and R>L collaterals, moderate non-obstructive dz in the prox LAD/LCx and distal RCA with more severe dz in the small branch vessels.  He also had HLD, Pericarditis, mild Pulmonary HTN with PASP 89mmHg 02/2018 and AAA.  He also has OAS but refused CPAP.  He most recently presented with dizziness and pre syncope and was found to have complete heart block and underwent PPM last month.    He is here today for followup and is doing well.  He denies any chest pain or pressure, PND, orthopnea, LE edema, dizziness, palpitations or syncope. He is concerned that his BP has been erratic running as low as [id number] sytsolic and highest 123456.  He admits to eating salty foods but dose not add sodium to his food.  He also continues to have DOE when working out in the yard and going up stairs. He is compliant with his meds and is tolerating meds with no SE.    The patient does not have symptoms concerning for COVID-19 infection (fever, chills, cough, or new shortness of breath).   Prior CV studies:   The following studies were reviewed today:  Cardiac cath and 2D echo  Past Medical History:  Diagnosis Date  . Abdominal hernia    Midline  . Adrenal incidentaloma ([place]) 2010  . Aortic stenosis    mild by echo 02/2018  . Ascending aortic aneurysm ([place])    a. 4.4cm by imaging 03/2018.  Marland Kitchen CAD (coronary artery disease)    a. Cath 03/2018 - CTO  of the prox-mid LAD with bridging, L-L and R-L collaterals, moderate, non-obstructive disease involving the proximal LAD, proximal LCx, and distal RCA, more severe disease noted involving small branch vessels (RV marginal and superior branch of OM3).  . Hyperlipidemia   . OSA (obstructive sleep apnea)    Severe PSG 1/07 AHI 66/hr, O2 Nadir 50% Refuses treatment - Pt does not believe test was accurate  . Pericarditis    now on colchicine  . Pulmonary HTN ([place])    mild with PASP 4mmHg by echo 02/2018  . Screening for  AAA (abdominal aortic aneurysm) 2010   CT Abd/Pelvis  . Ventral hernia    03/2009   Past Surgical History:  Procedure Laterality Date  . CORONARY ANGIOGRAPHY N/A 03/23/2018   Procedure: CORONARY ANGIOGRAPHY;  Surgeon: Nelva Bush, MD;  Location: [facility];  Service: Cardiovascular;  Laterality: N/A;  . HERNIA REPAIR  1/04  . KNEE ARTHROSCOPY Left 05/04  . PACEMAKER IMPLANT N/A 11/08/2018   Procedure: PACEMAKER IMPLANT;  Surgeon: Constance Haw, MD;  Location: [facility];  Service: Cardiovascular;  Laterality: N/A;  . TOTAL KNEE ARTHROPLASTY Bilateral 05/12  . VENTRAL HERNIA REPAIR  03/2009     Current Meds  Medication Sig  . acetaminophen (TYLENOL) 500 MG tablet Take 1,000 mg by mouth every 6 (six) hours as needed (pain).  Marland Kitchen aspirin EC 81 MG tablet Take 81 mg by mouth daily.  . cetirizine (ZYRTEC) 10 MG tablet Take 10 mg by mouth daily as needed for allergies.  . Coenzyme Q10 (CO Q 10) 100 MG CAPS Take 100 mg by mouth daily.  . isosorbide mononitrate (IMDUR) 30 MG 24 hr tablet Take 1 tablet (30 mg total) by mouth daily.  . metoprolol succinate (TOPROL-XL) 12.5 mg TB24 24 hr tablet Take 12.5 mg by mouth daily.  . Multiple Vitamin (MULTIVITAMIN) tablet Take 1 tablet by mouth daily.  . nitroGLYCERIN (NITROSTAT) 0.4 MG SL tablet Place 1 tablet (0.4 mg total) under the tongue every 5 (five) minutes as needed for chest pain.  . rosuvastatin (CRESTOR) 10 MG tablet Take 1 tablet (10 mg total) by mouth daily.     Allergies:   Sertraline hcl   Social History   Tobacco Use  . Smoking status: Former Research scientist (life sciences)  . Smokeless tobacco: Never Used  Substance Use Topics  . Alcohol use: No  . Drug use: No     Family Hx: The patient's family history includes Aneurysm in his father; Heart attack in his father; Heart disease in his father.  ROS:   Please see the history of present illness.     All other systems reviewed and are negative.   Labs/Other Tests and Data  Reviewed:    Recent Labs: 05/22/2018: ALT 22 10/02/2018: NT-Pro BNP 240; TSH 2.240 11/08/2018: BUN 19; Creatinine, Ser 0.79; Hemoglobin 16.5; Platelets 187; Potassium 4.3; Sodium 139   Recent Lipid Panel Lab Results  Component Value Date/Time   CHOL 130 09/14/2018 08:53 AM   TRIG 84 09/14/2018 08:53 AM   HDL 49 09/14/2018 08:53 AM   CHOLHDL 2.7 09/14/2018 08:53 AM   CHOLHDL 3.2 06/05/2010 07:15 AM   LDLCALC 64 09/14/2018 08:53 AM    Wt Readings from Last 3 Encounters:  01/02/19 (!) 301 lb (136.5 kg)  11/08/18 300 lb (136.1 kg)  10/23/18 (!) 304 lb (137.9 kg)     Objective:    Vital Signs:  BP (!) 148/78   Pulse 63   Ht 6' (  1.829 m)   Wt (!) 301 lb (136.5 kg)   BMI 40.82 kg/m     ASSESSMENT & PLAN:    1.  ASCAD -  CTO of the prox-mid LAD with bridging L>L and R>L collaterals, moderate non-obstructive dz in the prox LAD/LCx and distal RCA with more severe dz in the small branch vessels.  He has not had any chest pain but does have DOE when working in the yard and going up stairs.  He is morbidly obese and I suspect that this is the primary etiology of his SOB.  His BP is also not adequately controlled and he has too much Na in his diet.  I have encouraged him to lose some weight and reduce Na in his diet.  If SOB does not improve may need to consider increasing nitrates.  At time of cath it was mentioned that if he continued to have anginal sx could consider CTO of the LAD.  2.  Hypertension - BP fluctuates some at home and is as low as 168mmhg and highs in the 170's.  He has not been following a low Na diet and eats a lot of chips, bacon and other salty foods.  I have encouraged him to cut out high Na foods and follow BP for the next 2 weeks and call with results.  I am leary to increase BB further since he has had some low readings as well.  Continue on Toprol XL 12.5mg  daily and Imdur 30mg  daily.    3.  Hyperlipidemia - His LDL is at goal at 64 and he will continue Crestor 10mg   daily.    4.  Ascending aortic aneurysm - 4.4 cm by Chest CT in 03/2018.  Repeat CT 03/2019.  Continue on statin.  Needs better BP control.    5.  Aortic stenosis - mild AS by echo 03/2018.  Repeat for stability 03/2019.  COVID-19 Education: The signs and symptoms of COVID-19 were discussed with the patient and how to seek care for testing (follow up with PCP or arrange E-visit).  The importance of social distancing was discussed today.  Patient Risk:   After full review of this patient's clinical status, I feel that they are at least moderate risk at this time.  Time:   Today, I have spent 20 minutes directly with the patient on telemedicine discussing medical problems including CAD, HTN, HLD, aortic aneurysm.  We also reviewed the symptoms of COVID 19 and the ways to protect against contracting the virus with telehealth technology.  I spent an additional 5 minutes reviewing patient's chart including cath and echo.  Medication Adjustments/Labs and Tests Ordered: Current medicines are reviewed at length with the patient today.  Concerns regarding medicines are outlined above.  Tests Ordered: No orders of the defined types were placed in this encounter.  Medication Changes: No orders of the defined types were placed in this encounter.   Disposition:  Follow up in 6 month(s)  Signed, Fransico Him, MD  01/02/2019 8:12 AM    [place]

## 2019-02-07 ENCOUNTER — Ambulatory Visit (INDEPENDENT_AMBULATORY_CARE_PROVIDER_SITE_OTHER): Payer: PPO | Admitting: *Deleted

## 2019-02-07 DIAGNOSIS — I442 Atrioventricular block, complete: Secondary | ICD-10-CM

## 2019-02-07 LAB — CUP PACEART REMOTE DEVICE CHECK
Battery Remaining Longevity: 172 mo
Battery Voltage: 3.18 V
Brady Statistic AP VP Percent: 0.16 %
Brady Statistic AP VS Percent: 16.49 %
Brady Statistic AS VP Percent: 0.6 %
Brady Statistic AS VS Percent: 82.75 %
Brady Statistic RA Percent Paced: 16.65 %
Brady Statistic RV Percent Paced: 0.76 %
Date Time Interrogation Session: 20201001105030
Implantable Lead Implant Date: 20200702
Implantable Lead Implant Date: 20200702
Implantable Lead Location: 753859
Implantable Lead Location: 753860
Implantable Lead Model: 5076
Implantable Lead Model: 5076
Implantable Pulse Generator Implant Date: 20200702
Lead Channel Impedance Value: 342 Ohm
Lead Channel Impedance Value: 399 Ohm
Lead Channel Impedance Value: 418 Ohm
Lead Channel Impedance Value: 513 Ohm
Lead Channel Pacing Threshold Amplitude: 0.5 V
Lead Channel Pacing Threshold Amplitude: 0.625 V
Lead Channel Pacing Threshold Pulse Width: 0.4 ms
Lead Channel Pacing Threshold Pulse Width: 0.4 ms
Lead Channel Sensing Intrinsic Amplitude: 3.625 mV
Lead Channel Sensing Intrinsic Amplitude: 3.625 mV
Lead Channel Sensing Intrinsic Amplitude: 9.5 mV
Lead Channel Sensing Intrinsic Amplitude: 9.5 mV
Lead Channel Setting Pacing Amplitude: 3.5 V
Lead Channel Setting Pacing Amplitude: 3.5 V
Lead Channel Setting Pacing Pulse Width: 0.4 ms
Lead Channel Setting Sensing Sensitivity: 2.8 mV

## 2019-02-11 ENCOUNTER — Ambulatory Visit (INDEPENDENT_AMBULATORY_CARE_PROVIDER_SITE_OTHER): Payer: PPO | Admitting: Cardiology

## 2019-02-11 ENCOUNTER — Encounter: Payer: Self-pay | Admitting: Cardiology

## 2019-02-11 ENCOUNTER — Other Ambulatory Visit: Payer: Self-pay

## 2019-02-11 VITALS — BP 138/82 | HR 60 | Ht 72.0 in | Wt 307.2 lb

## 2019-02-11 DIAGNOSIS — I442 Atrioventricular block, complete: Secondary | ICD-10-CM

## 2019-02-11 LAB — CUP PACEART INCLINIC DEVICE CHECK
Battery Remaining Longevity: 173 mo
Battery Voltage: 3.18 V
Brady Statistic AP VP Percent: 0.16 %
Brady Statistic AP VS Percent: 16.69 %
Brady Statistic AS VP Percent: 0.59 %
Brady Statistic AS VS Percent: 82.56 %
Brady Statistic RA Percent Paced: 16.85 %
Brady Statistic RV Percent Paced: 0.75 %
Date Time Interrogation Session: 20201005172817
Implantable Lead Implant Date: 20200702
Implantable Lead Implant Date: 20200702
Implantable Lead Location: 753859
Implantable Lead Location: 753860
Implantable Lead Model: 5076
Implantable Lead Model: 5076
Implantable Pulse Generator Implant Date: 20200702
Lead Channel Impedance Value: 361 Ohm
Lead Channel Impedance Value: 456 Ohm
Lead Channel Impedance Value: 456 Ohm
Lead Channel Impedance Value: 532 Ohm
Lead Channel Pacing Threshold Amplitude: 0.5 V
Lead Channel Pacing Threshold Amplitude: 0.75 V
Lead Channel Pacing Threshold Pulse Width: 0.4 ms
Lead Channel Pacing Threshold Pulse Width: 0.4 ms
Lead Channel Sensing Intrinsic Amplitude: 13 mV
Lead Channel Sensing Intrinsic Amplitude: 4.5 mV
Lead Channel Setting Pacing Amplitude: 2 V
Lead Channel Setting Pacing Amplitude: 2.5 V
Lead Channel Setting Pacing Pulse Width: 0.4 ms
Lead Channel Setting Sensing Sensitivity: 2.8 mV

## 2019-02-11 NOTE — Patient Instructions (Addendum)
Medication Instructions:  Your physician recommends that you continue on your current medications as directed. Please refer to the Current Medication list given to you today.  *If you need a refill on your cardiac medications before your next appointment, please call your pharmacy*  Labwork: None ordered  Testing/Procedures: None ordered  Follow-Up: Remote monitoring is used to monitor your Pacemaker or ICD from home. This monitoring reduces the number of office visits required to check your device to one time per year. It allows [place] to keep an eye on the functioning of your device to ensure it is working properly. You are scheduled for a device check from home on 05/09/19. You may send your transmission at any time that day. If you have a wireless device, the transmission will be sent automatically. After your physician reviews your transmission, you will receive a postcard with your next transmission date.  Your physician wants you to follow-up in: 9 months with Dr. Curt Bears.  You will receive a reminder letter in the mail two months in advance. If you don't receive a letter, please call our office to schedule the follow-up appointment.  Thank you for choosing [facility]!!   Trinidad Curet, RN [phone]  Any Other Special Instructions Will Be Listed Below (If Applicable).

## 2019-02-11 NOTE — Progress Notes (Signed)
Electrophysiology Office Note   Date:  02/11/2019   ID:  ATREYA TERHAAR, DOB March 06, 1946, MRN [id number]  PCP:  Lajean Manes, MD  Cardiologist:  Radford Pax Primary Electrophysiologist:  Pete Schnitzer Meredith Leeds, MD    No chief complaint on file.    History of Present Illness: Clarence Andrade is a 73 y.o. male who is being seen today for the evaluation of heart block at the request of Cecilie Kicks. Presenting today for electrophysiology evaluation.  He has a history of mild aortic stenosis, coronary artery disease with CTO of the LAD, hyperlipidemia, OSA being referred for intermittent complete heart block.  He says that over the last few months, he is having gray out episodes.  He wore a cardiac monitor that showed intermittent complete heart block.  This is occurred only when he is sitting down, not when he is active.  This has not occurred when he is active or driving.  He was called last night and told to go to the emergency room which she declined.  His metoprolol was stopped last night.  Today, denies symptoms of palpitations, chest pain, shortness of breath, orthopnea, PND, lower extremity edema, claudication, dizziness, presyncope, syncope, bleeding, or neurologic sequela. The patient is tolerating medications without difficulties.  He is felt well without complaint.  He is able to do all of his daily activities.   Past Medical History:  Diagnosis Date  . Abdominal hernia    Midline  . Adrenal incidentaloma ([place]) 2010  . Aortic stenosis    mild by echo 02/2018  . Ascending aortic aneurysm ([place])    a. 4.4cm by imaging 03/2018.  Marland Kitchen CAD (coronary artery disease)    a. Cath 03/2018 - CTO of the prox-mid LAD with bridging, L-L and R-L collaterals, moderate, non-obstructive disease involving the proximal LAD, proximal LCx, and distal RCA, more severe disease noted involving small branch vessels (RV marginal and superior branch of OM3).  . Hyperlipidemia   . OSA (obstructive sleep apnea)     Severe PSG 1/07 AHI 66/hr, O2 Nadir 50% Refuses treatment - Pt does not believe test was accurate  . Pericarditis    now on colchicine  . Pulmonary HTN ([place])    mild with PASP 66mmHg by echo 02/2018  . Screening for AAA (abdominal aortic aneurysm) 2010   CT Abd/Pelvis  . Ventral hernia    03/2009   Past Surgical History:  Procedure Laterality Date  . CORONARY ANGIOGRAPHY N/A 03/23/2018   Procedure: CORONARY ANGIOGRAPHY;  Surgeon: Nelva Bush, MD;  Location: [facility];  Service: Cardiovascular;  Laterality: N/A;  . HERNIA REPAIR  1/04  . KNEE ARTHROSCOPY Left 05/04  . PACEMAKER IMPLANT N/A 11/08/2018   Procedure: PACEMAKER IMPLANT;  Surgeon: Constance Haw, MD;  Location: [facility];  Service: Cardiovascular;  Laterality: N/A;  . TOTAL KNEE ARTHROPLASTY Bilateral 05/12  . VENTRAL HERNIA REPAIR  03/2009     Current Outpatient Medications  Medication Sig Dispense Refill  . acetaminophen (TYLENOL) 500 MG tablet Take 1,000 mg by mouth every 6 (six) hours as needed (pain).    Marland Kitchen aspirin EC 81 MG tablet Take 81 mg by mouth daily.    . Coenzyme Q10 (CO Q 10) 100 MG CAPS Take 100 mg by mouth daily.    . isosorbide mononitrate (IMDUR) 30 MG 24 hr tablet Take 1 tablet (30 mg total) by mouth daily. 90 tablet 3  . metoprolol succinate (TOPROL-XL) 12.5 mg TB24 24 hr tablet  Take 12.5 mg by mouth daily.    . Multiple Vitamin (MULTIVITAMIN) tablet Take 1 tablet by mouth daily.    . nitroGLYCERIN (NITROSTAT) 0.4 MG SL tablet Place 1 tablet (0.4 mg total) under the tongue every 5 (five) minutes as needed for chest pain. 25 tablet 3  . rosuvastatin (CRESTOR) 10 MG tablet Take 1 tablet (10 mg total) by mouth daily. 90 tablet 3   No current facility-administered medications for this visit.    Facility-Administered Medications Ordered in Other Visits  Medication Dose Route Frequency Provider Last Rate Last Dose  . technetium tetrofosmin (TC-MYOVIEW) injection [id number] millicurie   123XX123 millicurie Intravenous Once PRN Josue Hector, MD        Allergies:   Sertraline hcl   Social History:  The patient  reports that he has quit smoking. He has never used smokeless tobacco. He reports that he does not drink alcohol or use drugs.   Family History:  The patient's family history includes Aneurysm in his father; Heart attack in his father; Heart disease in his father.    ROS:  Please see the history of present illness.   Otherwise, review of systems is positive for none.   All other systems are reviewed and negative.   PHYSICAL EXAM: VS:  BP 138/82   Pulse 60   Ht 6' (1.829 m)   Wt (!) 307 lb 3.2 oz (139.3 kg)   SpO2 94%   BMI 41.66 kg/m  , BMI Body mass index is 41.66 kg/m. GEN: Well nourished, well developed, in no acute distress  HEENT: normal  Neck: no JVD, carotid bruits, or masses Cardiac: RRR; no murmurs, rubs, or gallops,no edema  Respiratory:  clear to auscultation bilaterally, normal work of breathing GI: soft, nontender, nondistended, + BS MS: no deformity or atrophy  Skin: warm and dry, device site well healed Neuro:  Strength and sensation are intact Psych: euthymic mood, full affect  EKG:  EKG is ordered today. Personal review of the ekg ordered shows atrial paced, right superior axis, possible anterior infarct  Personal review of the device interrogation today. Results in [facility]: 05/22/2018: ALT 22 10/02/2018: NT-Pro BNP 240; TSH 2.240 11/08/2018: BUN 19; Creatinine, Ser 0.79; Hemoglobin 16.5; Platelets 187; Potassium 4.3; Sodium 139    Lipid Panel     Component Value Date/Time   CHOL 130 09/14/2018 0853   TRIG 84 09/14/2018 0853   HDL 49 09/14/2018 0853   CHOLHDL 2.7 09/14/2018 0853   CHOLHDL 3.2 06/05/2010 0715   VLDL 10 06/05/2010 0715   LDLCALC 64 09/14/2018 0853     Wt Readings from Last 3 Encounters:  02/11/19 (!) 307 lb 3.2 oz (139.3 kg)  01/02/19 (!) 301 lb (136.5 kg)  11/08/18 300 lb (136.1 kg)       Other studies Reviewed: Additional studies/ records that were reviewed today include: TTE 03/08/2018 Review of the above records today demonstrates:  Left ventricle: The cavity size was normal. There was severe   concentric hypertrophy. Systolic function was normal. The   estimated ejection fraction was in the range of 55% to 60%. Wall   motion was normal; there were no regional wall motion   abnormalities. There was an increased relative contribution of   atrial contraction to ventricular filling. Doppler parameters are   consistent with abnormal left ventricular relaxation (grade 1   diastolic dysfunction). - Aortic valve: Moderately calcified annulus. Trileaflet; mildly   thickened, mildly calcified leaflets. There was  mild stenosis.   There was trivial regurgitation. Mean gradient (S): 15 mm Hg.   Valve area (VTI): 1.53 cm^2. Valve area (Vmax): 1.48 cm^2. Valve   area (Vmean): 1.46 cm^2. - Aorta: Ascending aorta diameter: 41 mm (ED). - Ascending aorta: The ascending aorta was mildly dilated. - Mitral valve: Calcified annulus. There was mild regurgitation. - Left atrium: The atrium was mildly dilated. - Pulmonic valve: There was mild regurgitation. - Pulmonary arteries: PA peak pressure: 39 mm Hg (S).  LHC 03/23/18 1. Severe single-vessel coronary artery disease, with chronic total occlusion of the proximal to mid LAD with bridging, left-to-left, and right-to-left collaterals. 2. Moderate, non-obstructive disease involving the proximal LAD, proximal LCx, and distal RCA.  More severe disease noted involving small branch vessels (RV marginal and superior branch of OM3). 3. Inability to cross the aortic valve for left heart catheterization due to insufficient catheter length complicated by tortuous subclavian artery and dilated ascending aorta.  LVEF noted to be normal on recent echocardiogram. ASSESSMENT AND PLAN:  1.  Intermittent complete heart block: Status post Medtronic  dual-chamber pacemaker implanted 11/08/2018.  Device functioning appropriately.  No changes.  2.  Coronary artery disease: Status post CTO of the LAD.  No current chest pain.  3.  Hyperlipidemia: Goal LDL less than 70.  Continue Crestor.    Current medicines are reviewed at length with the patient today.   The patient does not have concerns regarding his medicines.  The following changes were made today:  none  Labs/ tests ordered today include:  Orders Placed This Encounter  Procedures  . EKG 12-Lead    Disposition:   FU with Clarence Andrade 9 months  Signed, Nyashia Raney Meredith Leeds, MD  02/11/2019 2:41 PM     [facility] [address] [place] [address] [phone] (office) [phone] (fax)

## 2019-02-14 ENCOUNTER — Encounter: Payer: Self-pay | Admitting: Cardiology

## 2019-02-14 NOTE — Progress Notes (Signed)
Remote pacemaker transmission.   

## 2019-03-04 ENCOUNTER — Other Ambulatory Visit: Payer: PPO | Admitting: *Deleted

## 2019-03-04 ENCOUNTER — Other Ambulatory Visit: Payer: Self-pay

## 2019-03-04 DIAGNOSIS — I10 Essential (primary) hypertension: Secondary | ICD-10-CM

## 2019-03-04 LAB — BASIC METABOLIC PANEL
BUN/Creatinine Ratio: 19 (ref 10–24)
BUN: 16 mg/dL (ref 8–27)
CO2: 25 mmol/L (ref 20–29)
Calcium: 9 mg/dL (ref 8.6–10.2)
Chloride: 103 mmol/L (ref 96–106)
Creatinine, Ser: 0.83 mg/dL (ref 0.76–1.27)
GFR calc Af Amer: 101 mL/min/{1.73_m2} (ref >59)
GFR calc non Af Amer: 87 mL/min/{1.73_m2} (ref >59)
Glucose: 94 mg/dL (ref 65–99)
Potassium: 4.7 mmol/L (ref 3.5–5.2)
Sodium: 140 mmol/L (ref 134–144)

## 2019-03-11 ENCOUNTER — Ambulatory Visit (INDEPENDENT_AMBULATORY_CARE_PROVIDER_SITE_OTHER)
Admission: RE | Admit: 2019-03-11 | Discharge: 2019-03-11 | Disposition: A | Discharge status: Home / Self Care | Payer: PPO | Source: Ambulatory Visit | Attending: Cardiology | Admitting: Cardiology

## 2019-03-11 ENCOUNTER — Ambulatory Visit (HOSPITAL_COMMUNITY): Payer: PPO | Attending: Internal Medicine

## 2019-03-11 ENCOUNTER — Telehealth: Payer: Self-pay

## 2019-03-11 ENCOUNTER — Other Ambulatory Visit: Payer: Self-pay

## 2019-03-11 VITALS — BP 150/75

## 2019-03-11 DIAGNOSIS — I712 Thoracic aortic aneurysm, without rupture, unspecified: Secondary | ICD-10-CM

## 2019-03-11 DIAGNOSIS — I350 Nonrheumatic aortic (valve) stenosis: Secondary | ICD-10-CM | POA: Diagnosis not present

## 2019-03-11 MED ORDER — PERFLUTREN LIPID MICROSPHERE
1.0000 mL | INTRAVENOUS | Status: AC | PRN
  Administered 2019-03-11: 1 mL via INTRAVENOUS

## 2019-03-11 MED ORDER — IOHEXOL 350 MG/ML SOLN
100.0000 mL | Freq: Once | INTRAVENOUS | Status: AC | PRN
  Administered 2019-03-11: 15:00:00 100 mL via INTRAVENOUS

## 2019-03-11 NOTE — Telephone Encounter (Signed)
Notes recorded by Frederik Schmidt, RN on 03/11/2019 at 4:00 PM EST  The patient has been notified of the result and verbalized understanding. All questions (if any) were answered.  Frederik Schmidt, RN 03/11/2019 4:00 PM

## 2019-03-11 NOTE — Telephone Encounter (Signed)
-----   Message from Sueanne Margarita, MD sent at 03/11/2019  3:51 PM EST ----- CT showed 4.3cm thoracic aortic aneurysm which is stable.  Repeat study in 1 year.  Please forward CT to PCP for followup of lung nodule that is very small

## 2019-03-13 ENCOUNTER — Telehealth: Payer: Self-pay

## 2019-03-13 DIAGNOSIS — I350 Nonrheumatic aortic (valve) stenosis: Secondary | ICD-10-CM

## 2019-03-13 NOTE — Telephone Encounter (Signed)
Pt aware of results. Pt verbalized understanding. Will repeat echo in one year

## 2019-03-13 NOTE — Telephone Encounter (Signed)
-----   Message from Daune Perch, NP sent at 03/12/2019  1:18 PM EST ----- Echo showed advancement of aortic valve disease from mild to moderate and mean gradient from 15 to 25 mmHg. Please find out if pt's near syncopal episodes resolved with placement of pacemaker. Will plan to watch closely and repeat echo in 1 year.  I will also route this to Dr. Radford Pax for review.   Please send copy to Lajean Manes, MD  Daune Perch, NP

## 2019-03-19 ENCOUNTER — Encounter: Payer: Self-pay | Admitting: Physician Assistant

## 2019-03-19 NOTE — Progress Notes (Signed)
Virtual Visit via Video Note   This visit type was conducted due to national recommendations for restrictions regarding the COVID-19 Pandemic (e.g. social distancing) in an effort to limit this patient's exposure and mitigate transmission in our community.  Due to his co-morbid illnesses, this patient is at least at moderate risk for complications without adequate follow up.  This format is felt to be most appropriate for this patient at this time.  All issues noted in this document were discussed and addressed.  A limited physical exam was performed with this format.  Please refer to the patient's chart for his consent to telehealth for [facility] - consented prior to 10/2018 telemed visit. The patient was offered a virtual versus in person office visit in an effort to limit physical exposure during the Covid-19 pandemic and they elected to proceed with virtual visit.  Date:  03/20/2019   ID:  Clarence Andrade, DOB 1945/07/20, MRN [id number]  Patient Location: Home Provider Location: Home  PCP:  Lajean Manes, MD  Cardiologist:  Fransico Him, MD  Electrophysiologist:  Constance Haw, MD   Evaluation Performed:  Follow-Up Visit  Chief Complaint:  routine follow-up of CAD  History of Present Illness:    Clarence Andrade is a 73 y.o. male with CAD by cath 03/2018 as below, ascending aortic aneurysm, moderate aortic stenosis, CHB s/p Medtronic PPM 11/2018, pericarditis, prior hyperglycemia/hyponatremia, dyslipidemia, pulmonary nodule, adrenal incidentaloma, suspected OSA (pt declines treatment), mild pulmonary HTN (normal by echo 03/2019), morbid obesity, ventral hernia who presents for routine follow-up.   He had remote diagnosis of pericarditis and a/so has prior h/o coronary calcifications in CT. More recently he underwent workup for exertional chest pain that had been present ever since 06/2017. CT angio 03/13/18 ruled out PE but did show 4.4cm ascending thoracic aortic aneurysm.  Nuclear stress testing was abnormal with positive EKG, angina with stress, and imaging showing distal anterior wall apex and septum ischemia, EF 47%. Cath was arranged showing CTO of the prox-mid LAD with bridging, L-L and R-L collaterals, moderate, non-obstructive disease involving the proximal LAD, proximal LCx, and distal RCA, more severe disease noted involving small branch vessels (RV marginal and superior branch of OM3). Cath notable for inability to cross the aortic valve for left heart catheterization due to insufficient catheter length complicated by tortuous subclavian artery and dilated ascending aorta. Imdur was added, reserving CTO PCI for refractory angina with at least 2 antianginals. Cardiac MRI 04/09/18 done to exclude pericarditis showed normal EF 56% with mild mid-apical anteroseptal hypokinesis, mildly dilated RV with normal systolic function, [id number] wall thickness subendocardial LGE in the mid-apical anteroseptal wall, consistent with prior infarction, no evidence for pericardial inflammation or pericardial effusion. He was also trialed on low dose Toprol for some residual angina. Earlier this year he wrote in with symptoms of near-syncope and was found to have intermittent high grade and complete heart block on outpatient monitor. Episodes persisted despite discontinuation of very low dose BB. He ultimately underwent PPM implantation 11/2018. Last echo 03/11/19 showed EF 60-65%, grade 1 DD, mild dilation of aortic root, moderate aortic stenosis. Routine CTA 03/2019 showed 4.3cm ascending thoracic aortic aneurysm (stable, 2-45mm pulmonary nodule with recommendation for primary care to follow further. Repeat study in 1 year recommended. Last labs 03/04/19 showed normal BMET, 11/2018 CBC wnl, 09/2018 TSH wnl, LDL 64, 05/2018 LFTs wnl.  Overall he feels that he's been doing well. His near-syncopal episodes stopped immediately after PPM implantation. He still complains that  his overall "stamina" remains  decreased, but this is stable over the last few years per his report. He does not wish to revisit evaluation of sleep apnea. He remains chest pain free on current regimen. He denies any dyspnea. He just gets fatigued after a few hours in the yard, whereas he previously could work all day. He has noticed his BP running higher ever since PPM implantation. He saw his PCP today who recommended to start lisinopril and has planned followup.  The patient does not have symptoms concerning for COVID-19 infection (fever, chills, cough, or new shortness of breath).    Past Medical History:  Diagnosis Date  . Abdominal hernia    Midline  . Adrenal incidentaloma ([place]) 2010  . Aortic stenosis    mild by echo 02/2018  . Ascending aortic aneurysm ([place])    a. 4.4cm by imaging 03/2018.  Marland Kitchen CAD (coronary artery disease)    a. Cath 03/2018 - CTO of the prox-mid LAD with bridging, L-L and R-L collaterals, moderate, non-obstructive disease involving the proximal LAD, proximal LCx, and distal RCA, more severe disease noted involving small branch vessels (RV marginal and superior branch of OM3).  . Complete heart block ([place])   . History of pacemaker    a. 11/2018 - Medtronic, intermittent CHB.  Marland Kitchen Hyperlipidemia   . Morbid obesity ([place])   . OSA (obstructive sleep apnea)    Severe PSG 1/07 AHI 66/hr, O2 Nadir 50% Refuses treatment - Pt does not believe test was accurate  . Pericarditis    now on colchicine  . Pulmonary HTN ([place])    mild with PASP 25mmHg by echo 02/2018  . Pulmonary nodule   . Screening for AAA (abdominal aortic aneurysm) 2010   CT Abd/Pelvis  . Ventral hernia    03/2009   Past Surgical History:  Procedure Laterality Date  . CORONARY ANGIOGRAPHY N/A 03/23/2018   Procedure: CORONARY ANGIOGRAPHY;  Surgeon: Nelva Bush, MD;  Location: [facility];  Service: Cardiovascular;  Laterality: N/A;  . HERNIA REPAIR  1/04  . KNEE ARTHROSCOPY Left 05/04  . PACEMAKER IMPLANT N/A 11/08/2018    Procedure: PACEMAKER IMPLANT;  Surgeon: Constance Haw, MD;  Location: [facility];  Service: Cardiovascular;  Laterality: N/A;  . TOTAL KNEE ARTHROPLASTY Bilateral 05/12  . VENTRAL HERNIA REPAIR  03/2009     Current Meds  Medication Sig  . acetaminophen (TYLENOL) 500 MG tablet Take 1,000 mg by mouth every 6 (six) hours as needed (pain).  Marland Kitchen aspirin EC 81 MG tablet Take 81 mg by mouth daily.  . Coenzyme Q10 (CO Q 10) 100 MG CAPS Take 100 mg by mouth daily.  . isosorbide mononitrate (IMDUR) 30 MG 24 hr tablet Take 1 tablet (30 mg total) by mouth daily.  Marland Kitchen lisinopril (ZESTRIL) 5 MG tablet Take 5 mg by mouth daily.  . metoprolol succinate (TOPROL-XL) 12.5 mg TB24 24 hr tablet Take 12.5 mg by mouth daily.  . Multiple Vitamin (MULTIVITAMIN) tablet Take 1 tablet by mouth daily.  . nitroGLYCERIN (NITROSTAT) 0.4 MG SL tablet Place 1 tablet (0.4 mg total) under the tongue every 5 (five) minutes as needed for chest pain.  . rosuvastatin (CRESTOR) 10 MG tablet Take 1 tablet (10 mg total) by mouth daily.     Allergies:   Sertraline hcl   Social History   Tobacco Use  . Smoking status: Former Research scientist (life sciences)  . Smokeless tobacco: Never Used  Substance Use Topics  . Alcohol use: No  .  Drug use: No     Family Hx: The patient's family history includes Aneurysm in his father; Heart attack in his father; Heart disease in his father.  ROS:   Please see the history of present illness.    All other systems reviewed and are negative.   Prior CV studies:    Most recent pertinent cardiac studies are outlined above.  Labs/Other Tests and Data Reviewed:    EKG:  An ECG dated 02/11/19 was personally reviewed today and demonstrated:  atrial paced rhythm 60bpm, TWI I, aVL  Recent Labs: 05/22/2018: ALT 22 10/02/2018: NT-Pro BNP 240; TSH 2.240 11/08/2018: Hemoglobin 16.5; Platelets 187 03/04/2019: BUN 16; Creatinine, Ser 0.83; Potassium 4.7; Sodium 140   Recent Lipid Panel Lab Results  Component  Value Date/Time   CHOL 130 09/14/2018 08:53 AM   TRIG 84 09/14/2018 08:53 AM   HDL 49 09/14/2018 08:53 AM   CHOLHDL 2.7 09/14/2018 08:53 AM   CHOLHDL 3.2 06/05/2010 07:15 AM   LDLCALC 64 09/14/2018 08:53 AM    Wt Readings from Last 3 Encounters:  03/20/19 (!) 305 lb (138.3 kg)  02/11/19 (!) 307 lb 3.2 oz (139.3 kg)  01/02/19 (!) 301 lb (136.5 kg)     Objective:    Vital Signs:  BP (!) 148/90   Pulse 62   Temp (!) 97.5 F (36.4 C)   Ht 6' (1.829 m)   Wt (!) 305 lb (138.3 kg)   SpO2 96%   BMI 41.37 kg/m    VS reviewed. General - cheerful OWM in no acute distress HEENT - NCAT, EOM intact Pulm - No labored breathing, no coughing during visit, no audible wheezing, speaking in full sentences Neuro - A+Ox3, no slurred speech, answers questions appropriately Psych - Pleasant affect  ASSESSMENT & PLAN:    1. CAD - overall stable. His generalized "stamina" remains lower than what he recalls previously but does report it's stable in the time since catheterization. He has not had any further exertional angina on present regimen. The fatigue was present even before beta blocker addition. Would not make any changes today, except agree with need for BP control as outlined by primary care. He does not wish to revisit sleep study. Crestor dose was previously decreased due to myalgias. LDL at goal in 09/2018. He will be seeing primary care in 2-3 weeks and plans to have his cholesterol rechecked at that time. 2. Ascending aortic aneurysm - stable by recent CTA. Anticipate repeat 03/2020. Reviewed study with patient and reiterated routine aneurysm precautions. Continue low dose beta blocker as tolerated. We also discussed general lifestyle guides which include achieving/maintaining a healthy weight. 3. Moderate AS - reviewed finding with patient. Near-syncopal episodes resolved after PPM so do not feel this was a contributing factor. We'll continue to follow clinically and anticipate repeat  echocardiogram in 03/2020 as previously recommended. 4. Essential HTN - BP running [id number] systolic lately. PCP recommended starting lisinopril 5mg  daily with plans to f/u labs and titrate as needed. He mentioned primary care felt he'd likely wind up on a higher dose and I agree. Clarence Andrade likes to start slow with medications so he will follow plan as outlined by primary care. Should be have any adverse side effects, would consider amlodipine instead as it also holds antianginal properties as well. 5. CHB s/p PPM - no recurrent near-syncopal events. This is followed by EP.  COVID-19 Education: The signs and symptoms of COVID-19 were discussed with the patient and how to seek care for  testing (follow up with PCP or arrange E-visit).  The importance of social distancing was discussed today.  Time:   Today, I have spent 20 minutes with the patient with telehealth technology discussing the above problems.     Medication Adjustments/Labs and Tests Ordered: Current medicines are reviewed at length with the patient today.  Concerns regarding medicines are outlined above.   Disposition:  Follow up in 6 months with me or Dr. Radford Pax.  Signed, Charlie Pitter, PA-C  03/20/2019 5:01 PM    [facility]

## 2019-03-20 ENCOUNTER — Encounter: Payer: Self-pay | Admitting: Physician Assistant

## 2019-03-20 ENCOUNTER — Telehealth (INDEPENDENT_AMBULATORY_CARE_PROVIDER_SITE_OTHER): Payer: PPO | Admitting: Physician Assistant

## 2019-03-20 ENCOUNTER — Other Ambulatory Visit: Payer: Self-pay

## 2019-03-20 VITALS — BP 148/90 | HR 62 | Temp 97.5°F | Ht 72.0 in | Wt 305.0 lb

## 2019-03-20 DIAGNOSIS — I700 Atherosclerosis of aorta: Secondary | ICD-10-CM | POA: Diagnosis not present

## 2019-03-20 DIAGNOSIS — G4733 Obstructive sleep apnea (adult) (pediatric): Secondary | ICD-10-CM | POA: Diagnosis not present

## 2019-03-20 DIAGNOSIS — E7800 Pure hypercholesterolemia, unspecified: Secondary | ICD-10-CM | POA: Diagnosis not present

## 2019-03-20 DIAGNOSIS — Z6841 Body Mass Index (BMI) 40.0 and over, adult: Secondary | ICD-10-CM | POA: Diagnosis not present

## 2019-03-20 DIAGNOSIS — I712 Thoracic aortic aneurysm, without rupture: Secondary | ICD-10-CM

## 2019-03-20 DIAGNOSIS — I7121 Aneurysm of the ascending aorta, without rupture: Secondary | ICD-10-CM

## 2019-03-20 DIAGNOSIS — Z950 Presence of cardiac pacemaker: Secondary | ICD-10-CM

## 2019-03-20 DIAGNOSIS — I350 Nonrheumatic aortic (valve) stenosis: Secondary | ICD-10-CM

## 2019-03-20 DIAGNOSIS — I2510 Atherosclerotic heart disease of native coronary artery without angina pectoris: Secondary | ICD-10-CM | POA: Diagnosis not present

## 2019-03-20 DIAGNOSIS — I714 Abdominal aortic aneurysm, without rupture: Secondary | ICD-10-CM | POA: Diagnosis not present

## 2019-03-20 DIAGNOSIS — Z1389 Encounter for screening for other disorder: Secondary | ICD-10-CM | POA: Diagnosis not present

## 2019-03-20 DIAGNOSIS — I10 Essential (primary) hypertension: Secondary | ICD-10-CM | POA: Diagnosis not present

## 2019-03-20 DIAGNOSIS — Z0000 Encounter for general adult medical examination without abnormal findings: Secondary | ICD-10-CM | POA: Diagnosis not present

## 2019-03-20 DIAGNOSIS — J301 Allergic rhinitis due to pollen: Secondary | ICD-10-CM | POA: Diagnosis not present

## 2019-03-20 NOTE — Patient Instructions (Signed)
Medication Instructions:  Your physician recommends that you continue on your current medications as directed. Please refer to the Current Medication list given to you today.  *If you need a refill on your cardiac medications before your next appointment, please call your pharmacy*  Lab Work: None ordered  If you have labs (blood work) drawn today and your tests are completely normal, you will receive your results only by: Marland Kitchen MyChart Message (if you have MyChart) OR . A paper copy in the mail If you have any lab test that is abnormal or we need to change your treatment, we will call you to review the results.  Testing/Procedures: None ordered  Follow-Up: At [facility], you and your health needs are our priority.  As part of our continuing mission to provide you with exceptional heart care, we have created designated Provider Care Teams.  These Care Teams include your primary Cardiologist (physician) and Advanced Practice Providers (APPs -  Physician Assistants and Nurse Practitioners) who all work together to provide you with the care you need, when you need it.  Your next appointment:   6 months  The format for your next appointment:   In Person  Provider:   You may see Fransico Him, MD or one of the following Advanced Practice Providers on your designated Care Team:    Melina Copa, PA-C  Ermalinda Barrios, PA-C   Other Instructions  Information About Your Aneurysm   One of your tests has shown an aneurysm of your aorta. The word "aneurysm" refers to a bulge in an artery (blood vessel). Most people think of them in the context of an emergency, but yours was found incidentally. At this point there is nothing you need to do from a procedure standpoint, but there are some important things to keep in mind for day-to-day life.   Mainstays of therapy for aneurysms include very good blood pressure control, healthy lifestyle, and avoiding tobacco products and street drugs. Research has  raised concern that antibiotics in the fluoroquinolone class could be associated with increased risk of having an aneurysm develop or tear. This includes medicines that end in "floxacin," like Cipro or Levaquin. Make sure to discuss this information with other healthcare providers if you require antibiotics.   Since aneurysms can run in families, you should discuss your diagnosis with first degree relatives as they may need to be screened for this. Regular mild-moderate physical exercise is important, but avoid heavy lifting/weight lifting over 30lbs, chopping wood, shoveling snow or digging heavy earth with a shovel. It is best to avoid activities that cause grunting or straining (medically referred to as a "Valsalva maneuver"). This happens when a person bears down against a closed throat to increase the strength of arm or abdominal muscles. There's often a tendency to do this when lifting heavy weights, doing sit-ups, push-ups or chin-ups, etc., but it may be harmful.   This is a finding I would expect to be monitored periodically by your cardiology team. Most unruptured thoracic aortic aneurysms cause no symptoms, so they are often found during exams for other conditions. Contact a health care provider if you develop any discomfort in your upper back, neck, abdomen, trouble swallowing, cough or hoarseness, or unexplained weight loss. Get help right away if you develop severe pain in your upper back or abdomen that may move into your chest and arms, or any other concerning symptoms such as shortness of breath or fever.  ----

## 2019-04-10 DIAGNOSIS — I10 Essential (primary) hypertension: Secondary | ICD-10-CM | POA: Diagnosis not present

## 2019-04-10 DIAGNOSIS — Z79899 Other long term (current) drug therapy: Secondary | ICD-10-CM | POA: Diagnosis not present

## 2019-05-09 ENCOUNTER — Ambulatory Visit (INDEPENDENT_AMBULATORY_CARE_PROVIDER_SITE_OTHER): Payer: PPO | Admitting: *Deleted

## 2019-05-09 DIAGNOSIS — R001 Bradycardia, unspecified: Secondary | ICD-10-CM

## 2019-05-09 LAB — CUP PACEART REMOTE DEVICE CHECK
Battery Remaining Longevity: 173 mo
Battery Voltage: 3.16 V
Brady Statistic AP VP Percent: 0.2 %
Brady Statistic AP VS Percent: 21.86 %
Brady Statistic AS VP Percent: 0.65 %
Brady Statistic AS VS Percent: 77.29 %
Brady Statistic RA Percent Paced: 22.02 %
Brady Statistic RV Percent Paced: 0.85 %
Date Time Interrogation Session: 20201230230246
Implantable Lead Implant Date: 20200702
Implantable Lead Implant Date: 20200702
Implantable Lead Location: 753859
Implantable Lead Location: 753860
Implantable Lead Model: 5076
Implantable Lead Model: 5076
Implantable Pulse Generator Implant Date: 20200702
Lead Channel Impedance Value: 342 Ohm
Lead Channel Impedance Value: 380 Ohm
Lead Channel Impedance Value: 475 Ohm
Lead Channel Impedance Value: 532 Ohm
Lead Channel Pacing Threshold Amplitude: 0.625 V
Lead Channel Pacing Threshold Amplitude: 0.75 V
Lead Channel Pacing Threshold Pulse Width: 0.4 ms
Lead Channel Pacing Threshold Pulse Width: 0.4 ms
Lead Channel Sensing Intrinsic Amplitude: 4.25 mV
Lead Channel Sensing Intrinsic Amplitude: 4.25 mV
Lead Channel Sensing Intrinsic Amplitude: 8.5 mV
Lead Channel Sensing Intrinsic Amplitude: 8.5 mV
Lead Channel Setting Pacing Amplitude: 1.75 V
Lead Channel Setting Pacing Amplitude: 2 V
Lead Channel Setting Pacing Pulse Width: 0.4 ms
Lead Channel Setting Sensing Sensitivity: 2.8 mV

## 2019-05-10 NOTE — Progress Notes (Signed)
PPM remote 

## 2019-05-19 ENCOUNTER — Other Ambulatory Visit: Payer: Self-pay | Admitting: Physician Assistant

## 2019-05-20 ENCOUNTER — Other Ambulatory Visit: Payer: Self-pay

## 2019-05-20 MED ORDER — METOPROLOL SUCCINATE ER 25 MG PO TB24: 12.5000 mg | ORAL_TABLET | Freq: Every day | ORAL | 3 refills | Status: DC

## 2019-06-07 ENCOUNTER — Ambulatory Visit: Payer: PPO

## 2019-06-12 ENCOUNTER — Ambulatory Visit: Payer: PPO

## 2019-06-15 ENCOUNTER — Ambulatory Visit: Payer: PPO | Attending: Internal Medicine

## 2019-06-15 DIAGNOSIS — Z23 Encounter for immunization: Secondary | ICD-10-CM | POA: Insufficient documentation

## 2019-06-15 NOTE — Progress Notes (Signed)
   Covid-19 Vaccination Clinic  Name:  Clarence Andrade    MRN: [id number] DOB: 1945-07-10  06/15/2019  [name] was observed post Covid-19 immunization for 15 minutes without incidence. He was provided with Vaccine Information Sheet and instruction to access the V-Safe system.   [name] was instructed to call 911 with any severe reactions post vaccine: Marland Kitchen Difficulty breathing  . Swelling of your face and throat  . A fast heartbeat  . A bad rash all over your body  . Dizziness and weakness    Immunizations Administered    Name Date Dose VIS Date Route   Pfizer COVID-19 Vaccine 06/15/2019 10:14 AM 0.3 mL 04/19/2019 Intramuscular   Manufacturer: [place]   Lot: [id number]   [place]: [id number]

## 2019-07-10 ENCOUNTER — Ambulatory Visit: Payer: PPO | Attending: Internal Medicine

## 2019-07-10 DIAGNOSIS — Z23 Encounter for immunization: Secondary | ICD-10-CM

## 2019-07-10 NOTE — Progress Notes (Signed)
   Covid-19 Vaccination Clinic  Name:  Clarence Andrade    MRN: [id number] DOB: November 07, 1945  07/10/2019  [name] was observed post Covid-19 immunization for 15 minutes without incident. He was provided with Vaccine Information Sheet and instruction to access the V-Safe system.   [name] was instructed to call 911 with any severe reactions post vaccine: Marland Kitchen Difficulty breathing  . Swelling of face and throat  . A fast heartbeat  . A bad rash all over body  . Dizziness and weakness   Immunizations Administered    Name Date Dose VIS Date Route   Pfizer COVID-19 Vaccine 07/10/2019 10:27 AM 0.3 mL 04/19/2019 Intramuscular   Manufacturer: [place]   Lot: [id number]   [place]: [id number]

## 2019-07-23 DIAGNOSIS — I350 Nonrheumatic aortic (valve) stenosis: Secondary | ICD-10-CM | POA: Diagnosis not present

## 2019-07-23 DIAGNOSIS — I10 Essential (primary) hypertension: Secondary | ICD-10-CM | POA: Diagnosis not present

## 2019-07-23 DIAGNOSIS — I714 Abdominal aortic aneurysm, without rupture: Secondary | ICD-10-CM | POA: Diagnosis not present

## 2019-07-23 DIAGNOSIS — Z6841 Body Mass Index (BMI) 40.0 and over, adult: Secondary | ICD-10-CM | POA: Diagnosis not present

## 2019-08-08 ENCOUNTER — Telehealth: Payer: Self-pay | Admitting: Emergency Medicine

## 2019-08-08 ENCOUNTER — Ambulatory Visit (INDEPENDENT_AMBULATORY_CARE_PROVIDER_SITE_OTHER): Payer: PPO | Admitting: *Deleted

## 2019-08-08 DIAGNOSIS — Z950 Presence of cardiac pacemaker: Secondary | ICD-10-CM | POA: Diagnosis not present

## 2019-08-08 LAB — CUP PACEART REMOTE DEVICE CHECK
Battery Remaining Longevity: 171 mo
Battery Voltage: 3.12 V
Brady Statistic AP VP Percent: 0.14 %
Brady Statistic AP VS Percent: 15.63 %
Brady Statistic AS VP Percent: 0.72 %
Brady Statistic AS VS Percent: 83.51 %
Brady Statistic RA Percent Paced: 15.73 %
Brady Statistic RV Percent Paced: 0.86 %
Date Time Interrogation Session: 20210401012436
Implantable Lead Implant Date: 20200702
Implantable Lead Implant Date: 20200702
Implantable Lead Location: 753859
Implantable Lead Location: 753860
Implantable Lead Model: 5076
Implantable Lead Model: 5076
Implantable Pulse Generator Implant Date: 20200702
Lead Channel Impedance Value: 342 Ohm
Lead Channel Impedance Value: 380 Ohm
Lead Channel Impedance Value: 456 Ohm
Lead Channel Impedance Value: 475 Ohm
Lead Channel Pacing Threshold Amplitude: 0.625 V
Lead Channel Pacing Threshold Amplitude: 0.75 V
Lead Channel Pacing Threshold Pulse Width: 0.4 ms
Lead Channel Pacing Threshold Pulse Width: 0.4 ms
Lead Channel Sensing Intrinsic Amplitude: 3.375 mV
Lead Channel Sensing Intrinsic Amplitude: 3.375 mV
Lead Channel Sensing Intrinsic Amplitude: 7.5 mV
Lead Channel Sensing Intrinsic Amplitude: 7.5 mV
Lead Channel Setting Pacing Amplitude: 1.5 V
Lead Channel Setting Pacing Amplitude: 2 V
Lead Channel Setting Pacing Pulse Width: 0.4 ms
Lead Channel Setting Sensing Sensitivity: 2.8 mV

## 2019-08-08 NOTE — Telephone Encounter (Signed)
Scheduled transmission received that showed patient had episode of NSVT on 07/30/19 at 1926 that was 18 beats in duration. Patient was asymptomatic.

## 2019-08-09 NOTE — Progress Notes (Signed)
PPM Remote  

## 2019-08-12 ENCOUNTER — Other Ambulatory Visit: Payer: Self-pay | Admitting: Geriatric Medicine

## 2019-08-12 DIAGNOSIS — R1011 Right upper quadrant pain: Secondary | ICD-10-CM | POA: Diagnosis not present

## 2019-08-13 ENCOUNTER — Ambulatory Visit
Admission: RE | Admit: 2019-08-13 | Discharge: 2019-08-13 | Disposition: A | Discharge status: Home / Self Care | Payer: PPO | Source: Ambulatory Visit | Attending: Geriatric Medicine | Admitting: Geriatric Medicine

## 2019-08-13 DIAGNOSIS — K8020 Calculus of gallbladder without cholecystitis without obstruction: Secondary | ICD-10-CM | POA: Diagnosis not present

## 2019-08-13 DIAGNOSIS — R1011 Right upper quadrant pain: Secondary | ICD-10-CM

## 2019-08-15 ENCOUNTER — Telehealth: Payer: Self-pay | Admitting: Cardiology

## 2019-08-15 ENCOUNTER — Telehealth: Payer: Self-pay | Admitting: *Deleted

## 2019-08-15 MED ORDER — METOPROLOL SUCCINATE ER 25 MG PO TB24: 25.0000 mg | ORAL_TABLET | Freq: Every day | ORAL | 6 refills | Status: DC

## 2019-08-15 NOTE — Telephone Encounter (Signed)
Pacemaker is MRI compatible. Left message for Clemencia Course, NP 08/15/2019 10:41 AM

## 2019-08-15 NOTE — Telephone Encounter (Signed)
Informed patient of results and verbal understanding expressed. Pt agreeable to plan. He reports that he has been tired for a year/two now.  Pt advised that BB may cause fatigue when starting/increasing.  Advised that this increase may worsen fatigue but will monitor.  He is agreeable to calling office if fatigue worsens and doesn't subside after several weeks.  And/or symptoms/issues begin after med increase.

## 2019-08-15 NOTE — Telephone Encounter (Signed)
New message   [place] from Dr. Sherryll Burger office called about the patient. Pt had an ultrasound of his abdomin and he needs a MRI to look at his liver. Dr. Sherryll Burger office needs to know if an MRI is ok, since pt has a pacemaker.

## 2019-08-15 NOTE — Telephone Encounter (Signed)
-----   Message from Will Meredith Leeds, MD sent at 08/09/2019 11:13 AM EDT ----- Abnormal device interrogation reviewed.  Lead parameters and battery status stable.  Short runs of NSVT. Increase toprol XL to 25 mg

## 2019-08-16 ENCOUNTER — Other Ambulatory Visit (HOSPITAL_COMMUNITY): Payer: Self-pay | Admitting: Geriatric Medicine

## 2019-08-16 DIAGNOSIS — K769 Liver disease, unspecified: Secondary | ICD-10-CM

## 2019-08-19 ENCOUNTER — Other Ambulatory Visit: Payer: Self-pay | Admitting: Physician Assistant

## 2019-08-19 IMAGING — CR CHEST - 2 VIEW
2 series · 2 of 2 positions shown · non-contrast
Comparison: October 22, 2010

CLINICAL DATA: Status post pacemaker placement.

EXAM:
CHEST - 2 VIEW

[w chest pa]
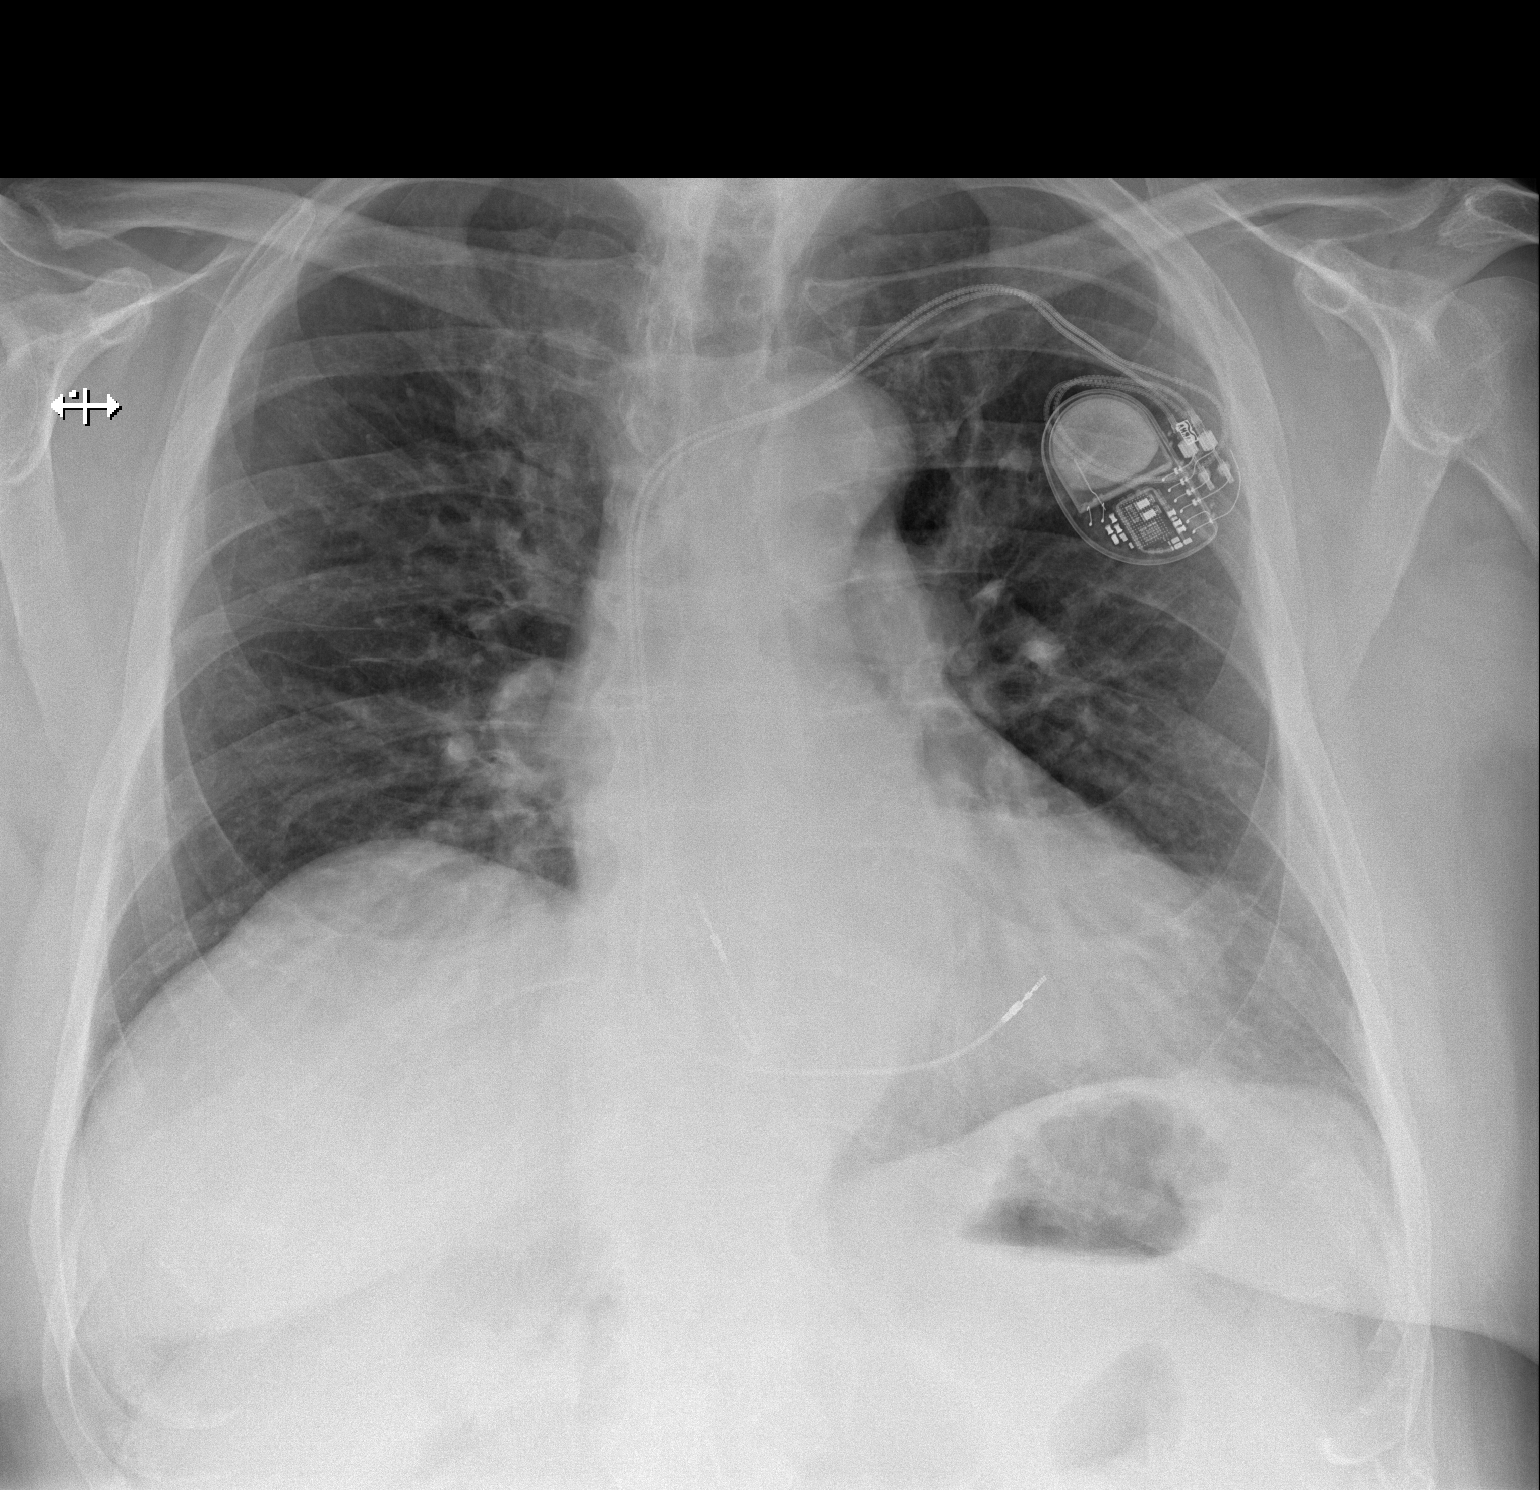

[w chest lat]
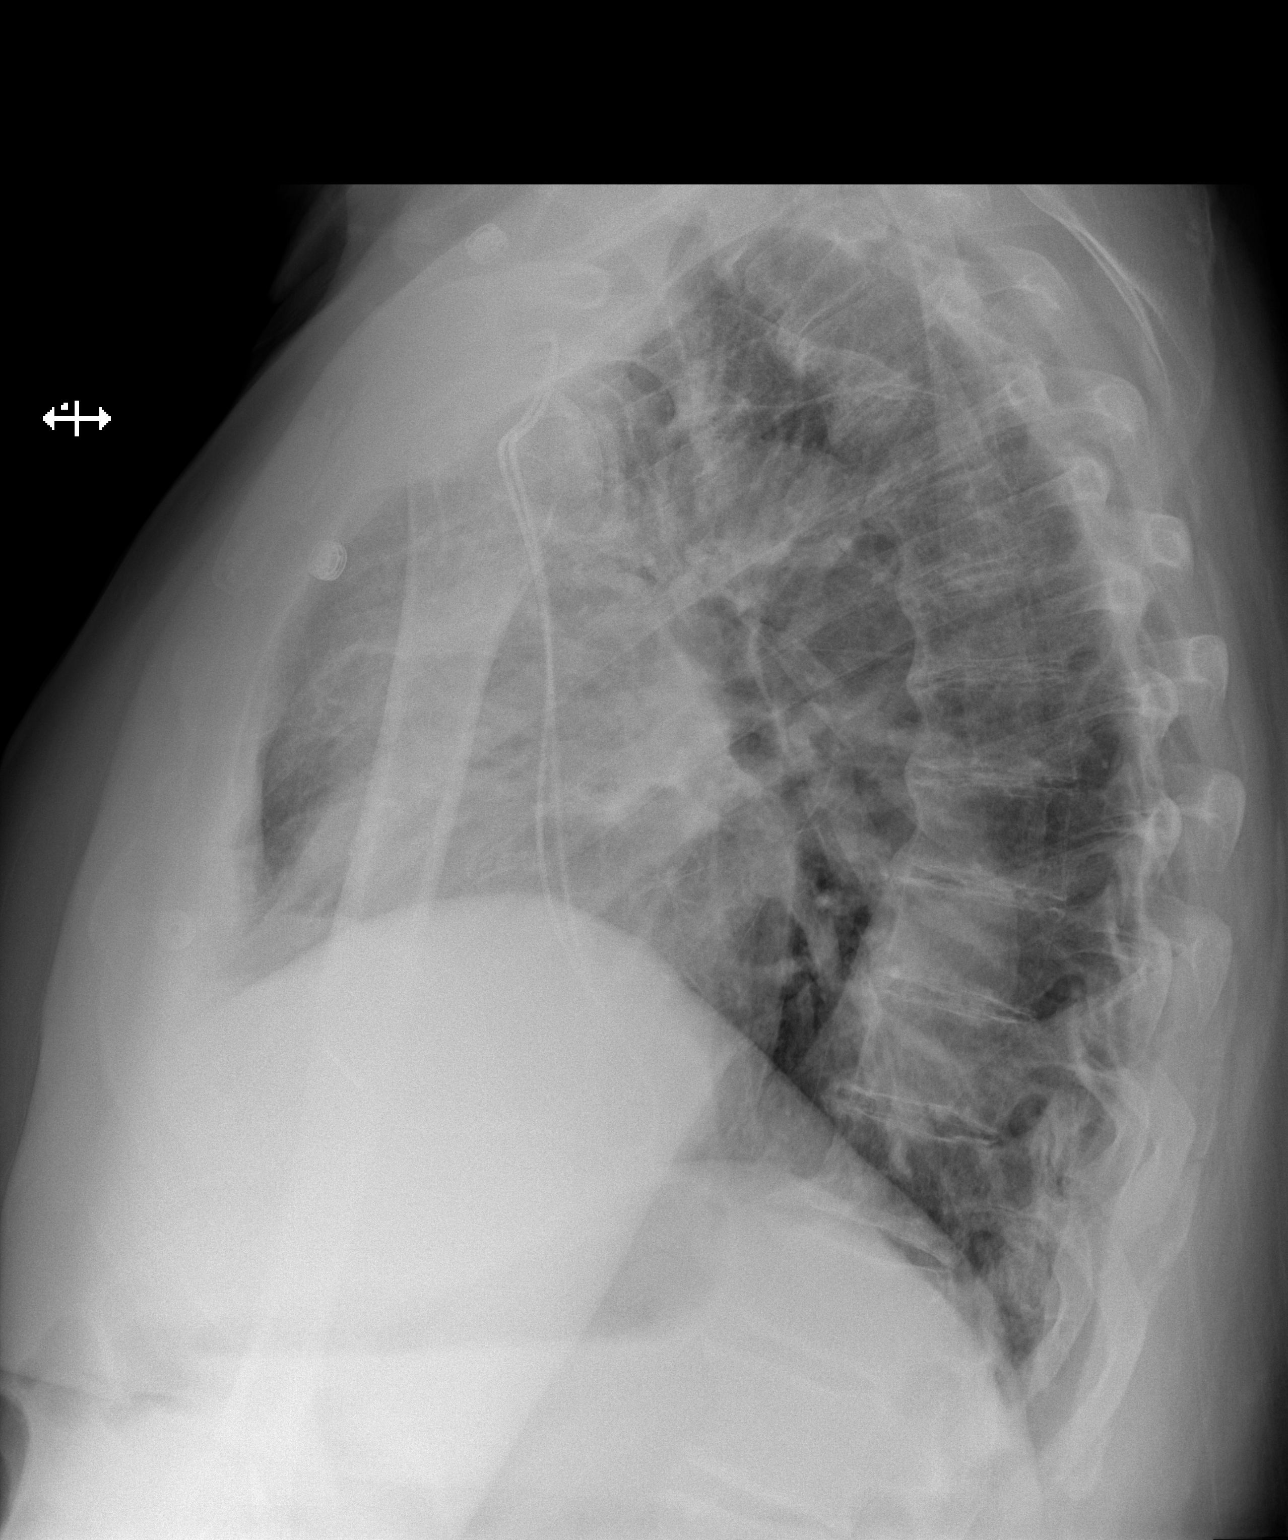

[2 of 2 positions shown; findings below may reference images not displayed]

FINDINGS: There is a newly placed dual chamber left-sided pacemaker. The leads
appear grossly well position. There is no left-sided pneumothorax.
The lungs are essentially clear. The heart size is stable and mildly
enlarged. There is no acute osseous abnormality detected.
IMPRESSION: Status post placement of a dual chamber pacemaker without evidence
of a pneumothorax.

## 2019-09-03 ENCOUNTER — Ambulatory Visit (HOSPITAL_COMMUNITY)
Admission: RE | Admit: 2019-09-03 | Discharge: 2019-09-03 | Disposition: A | Discharge status: Home / Self Care | Payer: PPO | Source: Ambulatory Visit | Attending: Geriatric Medicine | Admitting: Geriatric Medicine

## 2019-09-03 ENCOUNTER — Other Ambulatory Visit: Payer: Self-pay

## 2019-09-03 DIAGNOSIS — K8020 Calculus of gallbladder without cholecystitis without obstruction: Secondary | ICD-10-CM | POA: Diagnosis not present

## 2019-09-03 DIAGNOSIS — R935 Abnormal findings on diagnostic imaging of other abdominal regions, including retroperitoneum: Secondary | ICD-10-CM | POA: Diagnosis not present

## 2019-09-03 DIAGNOSIS — K769 Liver disease, unspecified: Secondary | ICD-10-CM | POA: Insufficient documentation

## 2019-09-03 MED ORDER — GADOBUTROL 1 MMOL/ML IV SOLN
10.0000 mL | Freq: Once | INTRAVENOUS | Status: AC | PRN
  Administered 2019-09-03: 10 mL via INTRAVENOUS

## 2019-09-03 NOTE — Progress Notes (Signed)
Per Clarence Frees PA- Device system confirmed to be MRI conditional  implant date > 6 weeks ago  no evidence of abandoned or epicardial leads in review of most recent CXR  interrogation from today reviewed, currently is AS/VS 75bpm (h/o AP nearly 20%)  Per order I changed device settings for MRI to  DOO at 85 bpm MRI Sure Scan.  After MRI I will  Program device back to pre-MRI settings after completion of exam.

## 2019-09-03 NOTE — Progress Notes (Signed)
Pt pacemaker reset to pre-MRI settings.  CLE sent

## 2019-09-09 MED ORDER — ISOSORBIDE MONONITRATE ER 30 MG PO TB24: 30.0000 mg | ORAL_TABLET | Freq: Every day | ORAL | 1 refills | Status: DC

## 2019-09-17 ENCOUNTER — Ambulatory Visit: Payer: Self-pay | Admitting: Surgery

## 2019-09-17 DIAGNOSIS — K8010 Calculus of gallbladder with chronic cholecystitis without obstruction: Secondary | ICD-10-CM | POA: Diagnosis not present

## 2019-09-18 ENCOUNTER — Telehealth: Payer: Self-pay | Admitting: *Deleted

## 2019-09-18 NOTE — Telephone Encounter (Signed)
OK to hold ASA for surgery

## 2019-09-18 NOTE — Telephone Encounter (Signed)
   [facility] Pre-operative Risk Assessment    Request for surgical clearance:  1. What type of surgery is being performed? LAP CHOLE   2. When is this surgery scheduled? TBD   3. What type of clearance is required (medical clearance vs. Pharmacy clearance to hold med vs. Both)? MEDICAL  4. Are there any medications that need to be held prior to surgery and how long? ASA X 5 DAYS PRIOR TO SURGERY   5. Practice name and name of physician performing surgery? [facility]; DR. Sherren Mocha GERKIN   6. What is your office phone number [phone]    7.   What is your office fax number [phone] ATTN: Mammie Lorenzo, LPN  8.   Anesthesia type (None, local, MAC, general) ? GENERAL   Clarence Andrade 09/18/2019, 11:02 AM  _________________________________________________________________   (provider comments below)

## 2019-09-18 NOTE — Telephone Encounter (Deleted)
   Primary Cardiologist: Fransico Him, MD  Chart reviewed and patient contacted by phone today as part of pre-operative protocol coverage. Given past medical history and time since last visit, based on ACC/AHA guidelines, Clarence Andrade would be at acceptable risk for the planned procedure without further cardiovascular testing.   In general we prefer the patient continue aspirin 81 mg in the perioperative period but will defer to the operating surgeon.   I will route this recommendation to the requesting party via Epic fax function and remove from pre-op pool.  Please call with questions.  Kerin Ransom, PA-C 09/18/2019, 3:02 PM

## 2019-09-18 NOTE — Telephone Encounter (Signed)
   Primary Cardiologist: Fransico Him, MD  Chart reviewed as part of pre-operative protocol coverage. Given past medical history and time since last visit, based on ACC/AHA guidelines, JOSEEDUARDO WINKLEPLECK would be at acceptable risk for the planned procedure without further cardiovascular testing.   OK to hold aspirin pre op if needed.   I will route this recommendation to the requesting party via Epic fax function and remove from pre-op pool.  Please call with questions.  Kerin Ransom, PA-C 09/18/2019, 3:17 PM

## 2019-09-18 NOTE — Telephone Encounter (Signed)
Dr Radford Pax needs your recommendations on holding aspirin 5 days pre op lap chole in this patient with occluded LAD with R-L collaterals, 50% CFX and pLAD by cath Nov 2019.  I contacted him today and he was feeling well- no angina.  Kerin Ransom PA-C 09/18/2019 3:07 PM

## 2019-09-24 ENCOUNTER — Encounter: Payer: Self-pay | Admitting: Physician Assistant

## 2019-09-24 NOTE — Progress Notes (Addendum)
Cardiology Office Note    Date:  09/25/2019   ID:  Clarence Andrade, DOB July 02, 1945, MRN [id number]  PCP:  Lajean Manes, MD  Cardiologist:  Fransico Him, MD  Electrophysiologist:  Constance Haw, MD   Chief Complaint: f/u CAD, aneurysm, aortic stenosis  History of Present Illness:   Clarence Andrade is a 74 y.o. male with history of CAD by cath 03/2018 as below, ascending aortic aneurysm, moderate aortic stenosis, CHB s/p Medtronic PPM 11/2018, pericarditis, prior hyperglycemia/hyponatremia, dyslipidemia, pulmonary nodule, adrenal incidentaloma, mild pulmonary HTN (normal by echo 03/2019), NSVT, essential HTN, morbid obesity, ventral hernia who presents for routine follow-up.   He had remote diagnosis of pericarditis and also CAD. He underwent nuclear stress test in 03/2018 that was abnormal prompting cath which showed CTO of the prox-mid LAD with bridging, L-L and R-L collaterals, moderate non-obstructive disease involving the proximal LAD, proximal LCx, and distal RCA, more severe disease noted involving small branch vessels (RV marginal and superior branch of OM3). Cath notable for inability to cross the aortic valve for left heart catheterization due to insufficient catheter length complicated by tortuous subclavian artery and dilated ascending aorta. His antianginal therapy was titrated, reserving CTO PCI for refractory angina with at least 2 antianginals. Cardiac MRI 04/09/18 done alongside workup to exclude pericarditis and showed normal EF 56% with mild mid-apical anteroseptal hypokinesis, mildly dilated RV with normal systolic function, [id number] wall thickness subendocardial LGE in the mid-apical anteroseptal wall, consistent with prior infarction, no evidence for pericardial inflammation or pericardial effusion. He has history of fatigue but has declined evaluation for OSA. He was started on Toprol for residual angina. In 2020 he developed near-syncope and was found to have intermitted  complete heart block on outpatient monitor. Episodes persisted despite discontinuation of very low dose BB. He ultimately underwent PPM implantation 11/2018. Last echo 03/11/19 showed EF 60-65%, grade 1 DD, mild dilation of aortic root, moderate aortic stenosis. Routine CTA 03/2019 showed 4.3cm ascending thoracic aortic aneurysm (stable, 2-46mm pulmonary nodule with recommendation for primary care to follow further). Repeat study in 1 year recommended. His device interrogation has revealed 3 short runs of NSVT so Dr. Curt Bears increased metoprolol. Last labs 03/04/19 showed normal BMET, 11/2018 CBC wnl, 09/2018 TSH wnl, LDL 64, 05/2018 LFTs wnl.  He presents back for follow-up overall doing well. He reports that ever since his diagnosis 2 years ago he lost some stamina but in the last few weeks he feels like he's starting to actually get it back. He has been doing aqua aerobics 5 days a week with his wife and enjoys it. No angina or dyspnea reported. No palpitations or syncope reported. He is pending lap chole and was cleared by our pre-op team recently for this. He and his wife have been fully vaccinated against Covid.   Past Medical History:  Diagnosis Date  . Abdominal hernia    Midline  . Adrenal incidentaloma ([place]) 2010  . Aortic stenosis    mild by echo 02/2018  . Ascending aortic aneurysm ([place])    a. 4.3cm by imaging 03/2019.  Marland Kitchen CAD (coronary artery disease)    a. Cath 03/2018 - CTO of the prox-mid LAD with bridging, L-L and R-L collaterals, moderate, non-obstructive disease involving the proximal LAD, proximal LCx, and distal RCA, more severe disease noted involving small branch vessels (RV marginal and superior branch of OM3).  . Complete heart block ([place])   . Essential hypertension   . History of pacemaker  a. 11/2018 - Medtronic, intermittent CHB.  Marland Kitchen Hyperlipidemia   . Morbid obesity ([place])   . NSVT (nonsustained ventricular tachycardia) ([place])   . OSA (obstructive sleep apnea)    Severe  PSG 1/07 AHI 66/hr, O2 Nadir 50% Refuses treatment - Pt does not believe test was accurate  . Pericarditis    now on colchicine  . Pulmonary HTN ([place])    mild with PASP 23mmHg by echo 02/2018  . Pulmonary nodule   . Screening for AAA (abdominal aortic aneurysm) 2010   CT Abd/Pelvis  . Ventral hernia    03/2009    Past Surgical History:  Procedure Laterality Date  . CORONARY ANGIOGRAPHY N/A 03/23/2018   Procedure: CORONARY ANGIOGRAPHY;  Surgeon: Nelva Bush, MD;  Location: [facility];  Service: Cardiovascular;  Laterality: N/A;  . HERNIA REPAIR  1/04  . KNEE ARTHROSCOPY Left 05/04  . PACEMAKER IMPLANT N/A 11/08/2018   Procedure: PACEMAKER IMPLANT;  Surgeon: Constance Haw, MD;  Location: [facility];  Service: Cardiovascular;  Laterality: N/A;  . TOTAL KNEE ARTHROPLASTY Bilateral 05/12  . VENTRAL HERNIA REPAIR  03/2009    Current Medications: Current Meds  Medication Sig  . acetaminophen (TYLENOL) 500 MG tablet Take 1,000 mg by mouth every 6 (six) hours as needed (pain).  Marland Kitchen aspirin EC 81 MG tablet Take 81 mg by mouth daily.  . Coenzyme Q10 (CO Q 10) 100 MG CAPS Take 100 mg by mouth daily.  . isosorbide mononitrate (IMDUR) 30 MG 24 hr tablet Take 1 tablet (30 mg total) by mouth daily.  Marland Kitchen lisinopril (ZESTRIL) 5 MG tablet Take 5 mg by mouth daily.  . metoprolol succinate (TOPROL XL) 25 MG 24 hr tablet Take 1 tablet (25 mg total) by mouth daily.  . Multiple Vitamin (MULTIVITAMIN) tablet Take 1 tablet by mouth daily.  . nitroGLYCERIN (NITROSTAT) 0.4 MG SL tablet Place 1 tablet (0.4 mg total) under the tongue every 5 (five) minutes as needed for chest pain.  . rosuvastatin (CRESTOR) 10 MG tablet TAKE 1 TABLET ONCE DAILY.      Allergies:   Sertraline hcl   Social History   Socioeconomic History  . Marital status: Married    Spouse name: Not on file  . Number of children: Not on file  . Years of education: Not on file  . Highest education level: Not on  file  Occupational History  . Not on file  Tobacco Use  . Smoking status: Former Research scientist (life sciences)  . Smokeless tobacco: Never Used  Substance and Sexual Activity  . Alcohol use: No  . Drug use: No  . Sexual activity: Not on file  Other Topics Concern  . Not on file  Social History Narrative  . Not on file   Social Determinants of Health   Financial Resource Strain:   . Difficulty of Paying Living Expenses:   Food Insecurity:   . Worried About Charity fundraiser in the Last Year:   . Arboriculturist in the Last Year:   Transportation Needs:   . Film/video editor (Medical):   Marland Kitchen Lack of Transportation (Non-Medical):   Physical Activity:   . Days of Exercise per Week:   . Minutes of Exercise per Session:   Stress:   . Feeling of Stress :   Social Connections:   . Frequency of Communication with Friends and Family:   . Frequency of Social Gatherings with Friends and Family:   . Attends Religious Services:   .  Active Member of Clubs or Organizations:   . Attends Archivist Meetings:   Marland Kitchen Marital Status:      Family History:  The patient's family history includes Aneurysm in his father; Heart attack in his father; Heart disease in his father.  ROS:   Please see the history of present illness.  All other systems are reviewed and otherwise negative.    EKGs/Labs/Other Studies Reviewed:    Studies reviewed are outlined and summarized above. Reports included below if pertinent.  2D echo 03/2019 Sonographer Comments: Technically difficult study due to poor echo  windows.  IMPRESSIONS    1. Left ventricular ejection fraction, by visual estimation, is 60 to  65%. The left ventricle has normal function. There is moderately increased  left ventricular hypertrophy.  2. Definity contrast agent was given IV to delineate the left ventricular  endocardial borders.  3. Left ventricular diastolic parameters are consistent with Grade I  diastolic dysfunction (impaired  relaxation).  4. Global right ventricle has normal systolic function.The right  ventricular size is normal. No increase in right ventricular wall  thickness.  5. Left atrial size was normal.  6. Right atrial size was normal.  7. The mitral valve is abnormal. Trace mitral valve regurgitation.  8. The tricuspid valve is grossly normal. Tricuspid valve regurgitation  is trivial.  9. Aortic valve area, by VTI measures 1.08 cm.  10. Aortic valve mean gradient measures 25.0 mmHg.  11. Aortic valve peak gradient measures 42.0 mmHg.  12. The aortic valve is tricuspid. Aortic valve regurgitation is trivial.  Moderate aortic valve stenosis.  13. The pulmonic valve was grossly normal. Pulmonic valve regurgitation is  not visualized.  14. Aortic dilatation noted.  15. There is mild dilatation of the aortic root measuring 40 mm.  16. Normal pulmonary artery systolic pressure.  17. A pacer wire is visualized.  18. The inferior vena cava is normal in size with <50% respiratory  variability, suggesting right atrial pressure of 8 mmHg.   In comparison to the previous echocardiogram(s): Prior examinations were  reviewed in a side by side comparison of images. 03/08/2018: LVEF 60-65%,  mild AS - mean gradient 15 mmHg.   LHC 03/2018 Conclusions: 1. Severe single-vessel coronary artery disease, with chronic total occlusion of the proximal to mid LAD with bridging, left-to-left, and right-to-left collaterals. 2. Moderate, non-obstructive disease involving the proximal LAD, proximal LCx, and distal RCA.  More severe disease noted involving small branch vessels (RV marginal and superior branch of OM3). 3. Inability to cross the aortic valve for left heart catheterization due to insufficient catheter length complicated by tortuous subclavian artery and dilated ascending aorta.  LVEF noted to be normal on recent echocardiogram.  Recommendations: 1. Medical therapy and secondary prevention.  I will  start isosorbide mononitrate 30 mg daily. 2. If Mr. Cusimano continues to have symptoms despite optimal medical therapy (max tolerated doses of at least 2 antianginal agents), PCI to CTO of the LAD should be considered. 3. Follow-up with Dr. Radford Pax or APP in ~2 weeks.  Recommend aspirin 81mg  daily for moderate/severe CAD.  Nelva Bush, MD [facility] Pager: [phone]    EKG:  EKG is ordered today, personally reviewed, demonstrating NSR 62bpm, first degree AVB, no acute STT changes  Recent Labs: 10/02/2018: NT-Pro BNP 240; TSH 2.240 11/08/2018: Hemoglobin 16.5; Platelets 187 03/04/2019: BUN 16; Creatinine, Ser 0.83; Potassium 4.7; Sodium 140  Recent Lipid Panel    Component Value Date/Time   CHOL 130 09/14/2018  0853   TRIG 84 09/14/2018 0853   HDL 49 09/14/2018 0853   CHOLHDL 2.7 09/14/2018 0853   CHOLHDL 3.2 06/05/2010 0715   VLDL 10 06/05/2010 0715   LDLCALC 64 09/14/2018 0853    PHYSICAL EXAM:    VS:  BP 132/78   Pulse 68   Ht 6' (1.829 m)   Wt (!) 308 lb 12.8 oz (140.1 kg)   SpO2 97%   BMI 41.88 kg/m   BMI: Body mass index is 41.88 kg/m. Morbid obesity category.  GEN: Well nourished, well developed WM in no acute distress HEENT: normocephalic, atraumatic Neck: no JVD, carotid bruits, or masses Cardiac: RRR; 2/6 SEM RUSB, no rubs or gallops, no edema  Respiratory:  clear to auscultation bilaterally, normal work of breathing GI: soft, nontender, nondistended, + BS MS: no deformity or atrophy Skin: warm and dry, no rash Neuro:  Alert and Oriented x 3, Strength and sensation are intact, follows commands Psych: euthymic mood, full affect  Wt Readings from Last 3 Encounters:  09/25/19 (!) 308 lb 12.8 oz (140.1 kg)  03/20/19 (!) 305 lb (138.3 kg)  02/11/19 (!) 307 lb 3.2 oz (139.3 kg)     ASSESSMENT & PLAN:   1. CAD - doing well without recent anginal symptoms. Continue ASA, BB, Imdur, statin. Pre-op team recently cleared him for upcoming lap chole. He  is not having any concerning anginal symptoms and exercises 5 days a week. I anticipate that he can be cleared for lap chole without further CV testing at this time. Addendum: given h/o NSVT, I also reviewed with Dr. Radford Pax - she agrees with plan as previously outlined. No CV testing needed before surgery. Electrolytes came back normal. She has already cleared in in our original pre-op clearance to hold aspirin if needed for procedure. Continue beta blocker perioperatively. Will cc this note to his surgical team as well. 2. Moderate aortic stenosis - no progressive symptoms noted. Will arrange annual echo to be scheduled for 03/2020. 3. NSVT - 3 runs noted on device interrogation in April; EP Dr. Curt Bears recommended titration of metoprolol which he is tolerating well. He is due for routine labs to include lipids and CMET - he is not fasting today so he will return tomorrow at which time we will also obtain Mg, TSH and CBC at that time. I will review with Dr. Radford Pax once lytes are back. Addendum: electrolytes are fine. Discussed with Dr. Radford Pax - no further testing felt needed at present time. She agrees with plan to continue to follow clinically with pacer checks as routinely scheduled to monitor for any subsequent arrhythmias.  4. Dyslipidemia - he has eaten today so will have him return for CMET/lipid profile fasting in AM. Crestor dose was previously decreased due to myalgias, which have resolved. 5. Ascending aortic aneurysm - have discussed diagnosis and precautions in depth with patient at prior visit. General guidelines outlined on instructions today. See below regarding BP.  6. Essential HTN - His BP is mildly elevated today. He cites this being due to having to wear a mask. He reports at home his readings are generally in the 1-teens which is closer to his goal given his TAA. He will continue to monitor at home and call if any elevated readings. Reviewed importance of lifestyle modification with regard  to overall health. We will follow up his labwork tomorrow.  Disposition: F/u with Dr. Radford Pax or myself in 6 months. Also is in for recall with Dr. Curt Bears in 11/2019.  Medication Adjustments/Labs and Tests Ordered: Current medicines are reviewed at length with the patient today.  Concerns regarding medicines are outlined above. Medication changes, Labs and Tests ordered today are summarized above and listed in the Patient Instructions accessible in Encounters.   Signed, Charlie Pitter, PA-C  09/25/2019 10:58 AM    [facility] [address], [place], [place]  [address] Phone: [phone]; Fax: [phone]

## 2019-09-25 ENCOUNTER — Ambulatory Visit: Payer: PPO | Admitting: Physician Assistant

## 2019-09-25 ENCOUNTER — Other Ambulatory Visit: Payer: Self-pay

## 2019-09-25 ENCOUNTER — Encounter: Payer: Self-pay | Admitting: Physician Assistant

## 2019-09-25 VITALS — BP 132/78 | HR 68 | Ht 72.0 in | Wt 308.8 lb

## 2019-09-25 DIAGNOSIS — I712 Thoracic aortic aneurysm, without rupture: Secondary | ICD-10-CM | POA: Diagnosis not present

## 2019-09-25 DIAGNOSIS — I2510 Atherosclerotic heart disease of native coronary artery without angina pectoris: Secondary | ICD-10-CM

## 2019-09-25 DIAGNOSIS — I4729 Other ventricular tachycardia: Secondary | ICD-10-CM

## 2019-09-25 DIAGNOSIS — E785 Hyperlipidemia, unspecified: Secondary | ICD-10-CM

## 2019-09-25 DIAGNOSIS — I350 Nonrheumatic aortic (valve) stenosis: Secondary | ICD-10-CM | POA: Diagnosis not present

## 2019-09-25 DIAGNOSIS — I10 Essential (primary) hypertension: Secondary | ICD-10-CM | POA: Diagnosis not present

## 2019-09-25 DIAGNOSIS — I472 Ventricular tachycardia: Secondary | ICD-10-CM | POA: Diagnosis not present

## 2019-09-25 DIAGNOSIS — I7121 Aneurysm of the ascending aorta, without rupture: Secondary | ICD-10-CM

## 2019-09-25 NOTE — Patient Instructions (Addendum)
Medication Instructions:  Your physician recommends that you continue on your current medications as directed. Please refer to the Current Medication list given to you today.  *If you need a refill on your cardiac medications before your next appointment, please call your pharmacy*   Lab Work: TOMORROW, 09/26/19:  LAB OPENS AT 7:30 AND MAY COME BACK ANYTIME AFTER THAT.  CMET, CBC, MAG, TSH, & LIPIDS    If you have labs (blood work) drawn today and your tests are completely normal, you will receive your results only by: Marland Kitchen MyChart Message (if you have MyChart) OR . A paper copy in the mail If you have any lab test that is abnormal or we need to change your treatment, we will call you to review the results.   Testing/Procedures:  Your physician recommends you have a Non-Cardiac CT Angiography (CTA), in November, is a special type of CT scan that uses a computer to produce multi-dimensional views of major blood vessels throughout the body. In CT angiography, a contrast material is injected through an IV to help visualize the blood vessels Someone will call when it's closer to that time to arrange.   Follow-Up: At [facility], you and your health needs are our priority.  As part of our continuing mission to provide you with exceptional heart care, we have created designated Provider Care Teams.  These Care Teams include your primary Cardiologist (physician) and Advanced Practice Providers (APPs -  Physician Assistants and Nurse Practitioners) who all work together to provide you with the care you need, when you need it.  We recommend signing up for the patient portal called "MyChart".  Sign up information is provided on this After Visit Summary.  MyChart is used to connect with patients for Virtual Visits (Telemedicine).  Patients are able to view lab/test results, encounter notes, upcoming appointments, etc.  Non-urgent messages can be sent to your provider as well.   To learn more about what  you can do with MyChart, go to [email or web address].    Your next appointment:   6 month(s)  The format for your next appointment:   In Person  Provider:   You may see Fransico Him, MD or one of the following Advanced Practice Providers on your designated Care Team:    Melina Copa, PA-C  Ermalinda Barrios, PA-C    Other Instructions  Please monitor your blood pressure occasionally at home. Call us if you tend to get readings of greater than 130 on the top number or 80 on the bottom number.  Information About Your Aneurysm  One of your tests has shown an aneurysm of your aorta. The word "aneurysm" refers to a bulge in an artery (blood vessel). Most people think of them in the context of an emergency, but yours was found incidentally. At this point there is nothing you need to do from a procedure standpoint, but there are some important things to keep in mind for day-to-day life.  Mainstays of therapy for aneurysms include very good blood pressure control, healthy lifestyle, and avoiding tobacco products and street drugs. Research has raised concern that antibiotics in the fluoroquinolone class could be associated with increased risk of having an aneurysm develop or tear. This includes medicines that end in "floxacin," like Cipro or Levaquin. Make sure to discuss this information with other healthcare providers if you require antibiotics.  Since aneurysms can run in families, you should discuss your diagnosis with first degree relatives as they may need to be screened for this.  Regular mild-moderate physical exercise is important, but avoid heavy lifting/weight lifting over 30lbs, chopping wood, shoveling snow or digging heavy earth with a shovel. It is best to avoid activities that cause grunting or straining (medically referred to as a "Valsalva maneuver"). This happens when a person bears down against a closed throat to increase the strength of arm or abdominal muscles. There's often a  tendency to do this when lifting heavy weights, doing sit-ups, push-ups or chin-ups, etc., but it may be harmful.  This is a finding I would expect to be monitored periodically by your cardiology team. Most unruptured thoracic aortic aneurysms cause no symptoms, so they are often found during exams for other conditions. Contact a health care provider if you develop any discomfort in your upper back, neck, abdomen, trouble swallowing, cough or hoarseness, or unexplained weight loss. Get help right away if you develop severe pain in your upper back or abdomen that may move into your chest and arms, or any other concerning symptoms such as shortness of breath or fever.

## 2019-09-26 ENCOUNTER — Other Ambulatory Visit: Payer: PPO | Admitting: *Deleted

## 2019-09-26 DIAGNOSIS — I350 Nonrheumatic aortic (valve) stenosis: Secondary | ICD-10-CM | POA: Diagnosis not present

## 2019-09-26 DIAGNOSIS — I472 Ventricular tachycardia: Secondary | ICD-10-CM | POA: Diagnosis not present

## 2019-09-26 DIAGNOSIS — E785 Hyperlipidemia, unspecified: Secondary | ICD-10-CM | POA: Diagnosis not present

## 2019-09-26 DIAGNOSIS — I2510 Atherosclerotic heart disease of native coronary artery without angina pectoris: Secondary | ICD-10-CM

## 2019-09-26 DIAGNOSIS — I7121 Aneurysm of the ascending aorta, without rupture: Secondary | ICD-10-CM

## 2019-09-26 DIAGNOSIS — I712 Thoracic aortic aneurysm, without rupture: Secondary | ICD-10-CM | POA: Diagnosis not present

## 2019-09-26 DIAGNOSIS — I4729 Other ventricular tachycardia: Secondary | ICD-10-CM

## 2019-09-26 LAB — LIPID PANEL
Chol/HDL Ratio: 2.7 ratio (ref 0.0–5.0)
Cholesterol, Total: 128 mg/dL (ref 100–199)
HDL: 48 mg/dL (ref >39)
LDL Chol Calc (NIH): 66 mg/dL (ref 0–99)
Triglycerides: 71 mg/dL (ref 0–149)
VLDL Cholesterol Cal: 14 mg/dL (ref 5–40)

## 2019-09-26 LAB — CBC
Hematocrit: 45.3 % (ref 37.5–51.0)
Hemoglobin: 15.7 g/dL (ref 13.0–17.7)
MCH: 31.9 pg (ref 26.6–33.0)
MCHC: 34.7 g/dL (ref 31.5–35.7)
MCV: 92 fL (ref 79–97)
Platelets: 194 10*3/uL (ref 150–450)
RBC: 4.92 x10E6/uL (ref 4.14–5.80)
RDW: 12.4 % (ref 11.6–15.4)
WBC: 7.5 10*3/uL (ref 3.4–10.8)

## 2019-09-26 LAB — BASIC METABOLIC PANEL
BUN/Creatinine Ratio: 26 — ABNORMAL HIGH (ref 10–24)
BUN: 22 mg/dL (ref 8–27)
CO2: 25 mmol/L (ref 20–29)
Calcium: 9.2 mg/dL (ref 8.6–10.2)
Chloride: 103 mmol/L (ref 96–106)
Creatinine, Ser: 0.86 mg/dL (ref 0.76–1.27)
GFR calc Af Amer: 99 mL/min/{1.73_m2} (ref >59)
GFR calc non Af Amer: 86 mL/min/{1.73_m2} (ref >59)
Glucose: 105 mg/dL — ABNORMAL HIGH (ref 65–99)
Potassium: 4.4 mmol/L (ref 3.5–5.2)
Sodium: 142 mmol/L (ref 134–144)

## 2019-09-26 LAB — TSH: TSH: 1.42 u[IU]/mL (ref 0.450–4.500)

## 2019-09-26 LAB — MAGNESIUM: Magnesium: 2.1 mg/dL (ref 1.6–2.3)

## 2019-09-30 ENCOUNTER — Encounter (HOSPITAL_COMMUNITY): Payer: Self-pay

## 2019-09-30 ENCOUNTER — Telehealth: Payer: Self-pay | Admitting: *Deleted

## 2019-09-30 ENCOUNTER — Encounter: Payer: Self-pay | Admitting: Cardiology

## 2019-09-30 DIAGNOSIS — I712 Thoracic aortic aneurysm, without rupture, unspecified: Secondary | ICD-10-CM

## 2019-09-30 DIAGNOSIS — Z01812 Encounter for preprocedural laboratory examination: Secondary | ICD-10-CM

## 2019-09-30 DIAGNOSIS — I2510 Atherosclerotic heart disease of native coronary artery without angina pectoris: Secondary | ICD-10-CM

## 2019-09-30 DIAGNOSIS — E785 Hyperlipidemia, unspecified: Secondary | ICD-10-CM

## 2019-09-30 NOTE — Patient Instructions (Signed)
DUE TO COVID-19 ONLY ONE VISITOR ARE ALLOWED TO COME WITH YOU AND STAY IN THE WAITING ROOM ONLY DURING PRE OP AND PROCEDURE. THEN TWO VISITORS MAY VISIT WITH YOU IN YOUR PRIVATE ROOM DURING VISITING HOURS ONLY!!   COVID SWAB TESTING MUST BE COMPLETED ON:  Tuesday, October 08, 2019 at 8:50AM [address] [place] [facility] enter pre surgical testing line (Must self quarantine after testing. Follow instructions on handout.)             Your procedure is scheduled on: Tuesday, October 11, 2019   Report to [facility] Main  Entrance   Report to Short Stay at 5:30 AM   [facility])   Call this number if you have problems the morning of surgery [phone]   Bring CPAP mask and tubing day of surgery   Do not eat food :After Midnight.   May have liquids until 4:30AM day of surgery   CLEAR LIQUID DIET  Foods Allowed                                                                     Foods Excluded  Water, Black Coffee and tea, regular and decaf                             liquids that you cannot  Plain Jell-O in any flavor  (No red)                                           see through such as: Fruit ices (not with fruit pulp)                                     milk, soups, orange juice  Iced Popsicles (No red)                                    All solid food Carbonated beverages, regular and diet                                    Apple juices Sports drinks like Gatorade (No red) Lightly seasoned clear broth or consume(fat free) Sugar, honey syrup  Sample Menu Breakfast                                Lunch                                     Supper Cranberry juice                    Beef broth  Chicken broth Jell-O                                     Grape juice                           Apple juice Coffee or tea                        Jell-O                                      Popsicle                                                 Coffee or tea                        Coffee or tea   Complete one Ensure drink the morning of surgery at  4:30AM the day of surgery.   Oral Hygiene is also important to reduce your risk of infection.                                    Remember - BRUSH YOUR TEETH THE MORNING OF SURGERY WITH YOUR REGULAR TOOTHPASTE   Do NOT smoke after Midnight   Take these medicines the morning of surgery with A SIP OF WATER: Isosorbide, Metoprolol, Rosuvastatin                               You may not have any metal on your body including jewelry, and body piercings             Do not wear lotions, powders, perfumes/cologne, or deodorant                          Men may shave face and neck.   Do not bring valuables to the hospital. [place].   Contacts, dentures or bridgework may not be worn into surgery.   Bring small overnight bag day of surgery.    Patients discharged the day of surgery will not be allowed to drive home.   Special Instructions: Bring a copy of your healthcare power of attorney and living will documents         the day of surgery if you haven't scanned them in before.              Please read over the following fact sheets you were given: IF YOU HAVE QUESTIONS ABOUT YOUR PRE OP INSTRUCTIONS PLEASE CALL [phone]   [facility] - Preparing for Surgery Before surgery, you can play an important role.  Because skin is not sterile, your skin needs to be as free of germs as possible.  You can reduce the number of germs on your skin by washing with CHG (chlorahexidine gluconate) soap before surgery.  CHG is an antiseptic cleaner which kills germs and bonds with the skin to continue killing germs even after washing. Please DO NOT use if you have an allergy to CHG or antibacterial soaps.  If your skin becomes reddened/irritated stop using the CHG and inform your nurse when you arrive at Short Stay. Do not shave (including legs  and underarms) for at least 48 hours prior to the first CHG shower.  You may shave your face/neck.  Please follow these instructions carefully:  1.  Shower with CHG Soap the night before surgery and the  morning of surgery.  2.  If you choose to wash your hair, wash your hair first as usual with your normal  shampoo.  3.  After you shampoo, rinse your hair and body thoroughly to remove the shampoo.                             4.  Use CHG as you would any other liquid soap.  You can apply chg directly to the skin and wash.  Gently with a scrungie or clean washcloth.  5.  Apply the CHG Soap to your body ONLY FROM THE NECK DOWN.   Do   not use on face/ open                           Wound or open sores. Avoid contact with eyes, ears mouth and   genitals (private parts).                       Wash face,  Genitals (private parts) with your normal soap.             6.  Wash thoroughly, paying special attention to the area where your    surgery  will be performed.  7.  Thoroughly rinse your body with warm water from the neck down.  8.  DO NOT shower/wash with your normal soap after using and rinsing off the CHG Soap.                9.  Pat yourself dry with a clean towel.            10.  Wear clean pajamas.            11.  Place clean sheets on your bed the night of your first shower and do not  sleep with pets. Day of Surgery : Do not apply any lotions/deodorants the morning of surgery.  Please wear clean clothes to the hospital/surgery center.  FAILURE TO FOLLOW THESE INSTRUCTIONS MAY RESULT IN THE CANCELLATION OF YOUR SURGERY  PATIENT SIGNATURE_________________________________  NURSE SIGNATURE__________________________________  ________________________________________________________________________

## 2019-09-30 NOTE — Telephone Encounter (Signed)
-----   Message from Charlie Pitter, [place] sent at 09/27/2019  7:54 AM EDT ----- Please let Mr. Thielbar know all labs look good! One issue - they were supposed to draw CMET with labs, but looks like the lab erroneously put under the BMET he will need in November before his CT. Can we please add LFTs onto this lab and make sure the order is re-entered for November? Please make lab aware as well so they know to look out for this issue in the future, thank you. I do want him to continue to follow his BP at home. With his aneurysm issue I think his BP should be closer to the 120/80 range so please make sure he lets [place] know if he begins seeing ranges closer to the 130s like we saw in the office. Please let him know I'll also plan to review his recent OV with Dr. Radford Pax as well given his upcoming surgery.I also want to route this note to Dr. Radford Pax for review to make her aware patient had 3 episodes of brief NSVT in April now that we know lytes are normal - he had two 1 second events and one 7 second event. Dr. Curt Bears follows him for his pacer and increased his Toprol to 25mg  daily. No other specific recs made at that time. Mr. Gallia has been feeling better than he has a in a long time recently so no acute symptoms, no angina, no palpitations, near-syncope or syncope. He does aqua aerobics 5 days a week. Is there anything you would do differently or continue surveillance by pacer? He is coming up for lap chole in the near future and was cleared by our pre-op team for this. Thank you! Dayna

## 2019-09-30 NOTE — Telephone Encounter (Signed)
Charlie Pitter, PA-C  09/27/2019 9:02 AM EDT    Dr. Radford Pax agreed with my plan from recent [place] - no new recommendations, continue plan as discussed, we'll continue to follow for any recurrent rhythm abnormalities on his pacer, and OK to proceed with surgery as we discussed!

## 2019-09-30 NOTE — Progress Notes (Signed)
PERIOPERATIVE PRESCRIPTION FOR IMPLANTED CARDIAC DEVICE PROGRAMMING  Patient Information: Name:  MILAS SHONG  DOB:  02-12-1946  MRN:  [id number]    Planned Procedure: Laparoscopic Cholecystectomy  Surgeon: Dr. Armandina Gemma  Date of Procedure: 10/11/2019  Cautery will be used.  Position during surgery:   Please send documentation back to:  [facility] (Fax # [phone])   Marnette Burgess, RN  09/30/2019 2:01 PM   Device Information:  Clinic EP Physician:  Allegra Lai, MD   Device Type:  Pacemaker Manufacturer and Phone #:  Medtronic: [phone] Pacemaker Dependent?:  No. Date of Last Device Check:  09/03/19 Normal Device Function?:  No.  Electrophysiologist's Recommendations:   Have magnet available.  Provide continuous ECG monitoring when magnet is used or reprogramming is to be performed.   Procedure may interfere with device function.  Magnet should be placed over device during procedure.  Per Device Clinic Standing Orders, York Ram, RN  4:11 PM 09/30/2019

## 2019-09-30 NOTE — Telephone Encounter (Signed)
I spoke with lab and LFTs added to recent lab work.  BMP order placed for prior to CT in November.  I spoke with patient's wife and reviewed recommendations from Melina Copa, [place] with her.  I told her we would call if LFTs were abnormal.  She reports patient has viewed results of lab work in my chart.  Patient's wife reports patient has been checking BP at home and some readings have been around 130's.  She thinks BP has been elevated due to upcoming surgery.  I asked her to have patient continue to check BP and send readings to our office in next 2 weeks or so if continuing to run above 130.

## 2019-10-01 LAB — SPECIMEN STATUS REPORT

## 2019-10-01 LAB — HEPATIC FUNCTION PANEL
ALT: 17 IU/L (ref 0–44)
AST: 24 IU/L (ref 0–40)
Albumin: 4.2 g/dL (ref 3.7–4.7)
Alkaline Phosphatase: 72 IU/L (ref 48–121)
Bilirubin Total: <0.2 mg/dL (ref 0.0–1.2)
Bilirubin, Direct: 0.07 mg/dL (ref 0.00–0.40)
Total Protein: 6.7 g/dL (ref 6.0–8.5)

## 2019-10-02 ENCOUNTER — Encounter (HOSPITAL_COMMUNITY): Payer: Self-pay

## 2019-10-02 ENCOUNTER — Encounter (HOSPITAL_COMMUNITY)
Admission: RE | Admit: 2019-10-02 | Discharge: 2019-10-02 | Disposition: A | Discharge status: Home / Self Care | Payer: PPO | Source: Ambulatory Visit | Attending: Surgery | Admitting: Surgery

## 2019-10-02 ENCOUNTER — Other Ambulatory Visit: Payer: Self-pay

## 2019-10-02 DIAGNOSIS — Z87891 Personal history of nicotine dependence: Secondary | ICD-10-CM | POA: Diagnosis not present

## 2019-10-02 DIAGNOSIS — I509 Heart failure, unspecified: Secondary | ICD-10-CM | POA: Diagnosis not present

## 2019-10-02 DIAGNOSIS — I110 Hypertensive heart disease with heart failure: Secondary | ICD-10-CM | POA: Diagnosis not present

## 2019-10-02 DIAGNOSIS — Z6841 Body Mass Index (BMI) 40.0 and over, adult: Secondary | ICD-10-CM | POA: Insufficient documentation

## 2019-10-02 DIAGNOSIS — I252 Old myocardial infarction: Secondary | ICD-10-CM | POA: Insufficient documentation

## 2019-10-02 DIAGNOSIS — K8010 Calculus of gallbladder with chronic cholecystitis without obstruction: Secondary | ICD-10-CM | POA: Insufficient documentation

## 2019-10-02 DIAGNOSIS — Z01812 Encounter for preprocedural laboratory examination: Secondary | ICD-10-CM | POA: Diagnosis not present

## 2019-10-02 DIAGNOSIS — I2510 Atherosclerotic heart disease of native coronary artery without angina pectoris: Secondary | ICD-10-CM | POA: Insufficient documentation

## 2019-10-02 DIAGNOSIS — Z79899 Other long term (current) drug therapy: Secondary | ICD-10-CM | POA: Insufficient documentation

## 2019-10-02 DIAGNOSIS — Z7982 Long term (current) use of aspirin: Secondary | ICD-10-CM | POA: Diagnosis not present

## 2019-10-02 DIAGNOSIS — Z7901 Long term (current) use of anticoagulants: Secondary | ICD-10-CM | POA: Insufficient documentation

## 2019-10-02 DIAGNOSIS — Z950 Presence of cardiac pacemaker: Secondary | ICD-10-CM | POA: Insufficient documentation

## 2019-10-02 HISTORY — DX: Acute myocardial infarction, unspecified: I21.9

## 2019-10-02 HISTORY — DX: Cardiac murmur, unspecified: R01.1

## 2019-10-02 HISTORY — DX: Pneumonia, unspecified organism: J18.9

## 2019-10-02 HISTORY — DX: Calculus of gallbladder without cholecystitis without obstruction: K80.20

## 2019-10-02 NOTE — Progress Notes (Addendum)
PCP - Dr. Particia Nearing Cardiologist - Dr. Ashok Norris.: clearance.: Dr. Elliot Cousin. 09/30/19 Epic  Chest x-ray - 11/08/18 EKG - 09/25/19 Stress Test - 03/20/18 ECHO - 03/11/19 Cardiac Cath - 03/23/18  Sleep Study - yes CPAP - no  Fasting Blood Sugar -  Checks Blood Sugar _____ times a day  Blood Thinner Instructions: Aspirin Instructions: To stop five days before surgery as per cardiologist. Last Dose:  Anesthesia review: Hx: aortic stenosis,compleate HB,OSA,HTN,pulm. HTN,NSVT  Patient denies shortness of breath, fever, cough and chest pain at PAT appointment   Patient verbalized understanding of instructions that were given to them at the PAT appointment. Patient was also instructed that they will need to review over the PAT instructions again at home before surgery.

## 2019-10-03 ENCOUNTER — Encounter (HOSPITAL_COMMUNITY)
Admission: RE | Admit: 2019-10-03 | Discharge: 2019-10-03 | Disposition: A | Discharge status: Home / Self Care | Payer: PPO | Source: Ambulatory Visit | Attending: Surgery | Admitting: Surgery

## 2019-10-03 DIAGNOSIS — Z01812 Encounter for preprocedural laboratory examination: Secondary | ICD-10-CM | POA: Diagnosis not present

## 2019-10-03 LAB — CBC
HCT: 50.2 % (ref 39.0–52.0)
Hemoglobin: 16.3 g/dL (ref 13.0–17.0)
MCH: 31.7 pg (ref 26.0–34.0)
MCHC: 32.5 g/dL (ref 30.0–36.0)
MCV: 97.5 fL (ref 80.0–100.0)
Platelets: 180 10*3/uL (ref 150–400)
RBC: 5.15 MIL/uL (ref 4.22–5.81)
RDW: 12.6 % (ref 11.5–15.5)
WBC: 7.8 10*3/uL (ref 4.0–10.5)
nRBC: 0 % (ref 0.0–0.2)

## 2019-10-03 LAB — BASIC METABOLIC PANEL
Anion gap: 8 (ref 5–15)
BUN: 21 mg/dL (ref 8–23)
CO2: 29 mmol/L (ref 22–32)
Calcium: 8.9 mg/dL (ref 8.9–10.3)
Chloride: 106 mmol/L (ref 98–111)
Creatinine, Ser: 0.83 mg/dL (ref 0.61–1.24)
GFR calc Af Amer: >60 mL/min (ref >60)
GFR calc non Af Amer: >60 mL/min (ref >60)
Glucose, Bld: 91 mg/dL (ref 70–99)
Potassium: 4.2 mmol/L (ref 3.5–5.1)
Sodium: 143 mmol/L (ref 135–145)

## 2019-10-04 NOTE — Anesthesia Preprocedure Evaluation (Addendum)
Anesthesia Evaluation  Patient identified by MRN, date of birth, ID band Patient awake    Reviewed: Allergy & Precautions, NPO status , Patient's Chart, lab work & pertinent test results, reviewed documented beta blocker date and time   Airway Mallampati: III  TM Distance: >3 FB Neck ROM: Full    Dental  (+) Teeth Intact, Caps, Missing, Dental Advisory Given   Pulmonary sleep apnea and Continuous Positive Airway Pressure Ventilation , pneumonia, resolved, former smoker,    Pulmonary exam normal breath sounds clear to auscultation       Cardiovascular hypertension, Pt. on medications and Pt. on home beta blockers + angina with exertion + CAD and + Past MI  + dysrhythmias Ventricular Tachycardia + pacemaker + Valvular Problems/Murmurs AS  Rhythm:Regular Rate:Normal  Pulmonary HTN- mild PASP 39 Hx/o MI Hx/o CHB EKG 09/25/19. NSR, 1st deg AVB Echo 03/11/19 1. Left ventricular ejection fraction, by visual estimation, is 60 to 65%. The left ventricle has normal function. There is moderately increased left ventricular hypertrophy.  2. Definity contrast agent was given IV to delineate the left ventricular endocardial borders.  3. Left ventricular diastolic parameters are consistent with Grade I diastolic dysfunction (impaired relaxation).  4. Global right ventricle has normal systolic function.The rightventricular size is normal. No increase in right ventricular wall thickness.  5. Left atrial size was normal.  6. Right atrial size was normal.  7. The mitral valve is abnormal. Trace mitral valve regurgitation.  8. The tricuspid valve is grossly normal. Tricuspid valve regurgitation is trivial.  9. Aortic valve area, by VTI measures 1.08 cm.  10. Aortic valve mean gradient measures 25.0 mmHg.  11. Aortic valve peak gradient measures 42.0 mmHg.  12. The aortic valve is tricuspid. Aortic valve regurgitation is trivial. Moderate aortic  valve stenosis.  13. The pulmonic valve was grossly normal. Pulmonic valve regurgitation is not visualized.  14. Aortic dilatation noted.  15. There is mild dilatation of the aortic root measuring 40 mm.  16. Normal pulmonary artery systolic pressure.  17. A pacer wire is visualized.  18. The inferior vena cava is normal in size with <50% respiratory variability, suggesting right atrial pressure of 8 mmHg.   Cardiac Cath 03/23/18 1. Severe single-vessel coronary artery disease, with chronic total occlusion of the proximal to mid LAD with bridging, left-to-left, and right-to-left collaterals. 2. Moderate, non-obstructive disease involving the proximal LAD, proximal LCx, and distal RCA.  More severe disease noted involving small branch vessels (RV marginal and superior branch of OM3). 3. Inability to cross the aortic valve for left heart catheterization due to insufficient catheter length complicated by tortuous subclavian artery and dilated ascending aorta.  LVEF noted to be normal on recent echocardiogram.    Neuro/Psych negative neurological ROS  negative psych ROS   GI/Hepatic Neg liver ROS, Cholelithiasis with chronic cholecystitis   Endo/Other  Morbid obesityHyperlipidemia  Renal/GU negative Renal ROS  negative genitourinary   Musculoskeletal negative musculoskeletal ROS (+)   Abdominal (+) + obese,  Abdomen: tender.    Peds  Hematology negative hematology ROS (+)   Anesthesia Other Findings   Reproductive/Obstetrics                        Anesthesia Physical Anesthesia Plan  ASA: III  Anesthesia Plan: General   Post-op Pain Management:    Induction: Intravenous, Rapid sequence and Cricoid pressure planned  PONV Risk Score and Plan: 4 or greater and Ondansetron, Dexamethasone and Treatment may  vary due to age or medical condition  Airway Management Planned: Oral ETT  Additional Equipment:   Intra-op Plan:   Post-operative Plan:  Extubation in OR  Informed Consent: I have reviewed the patients History and Physical, chart, labs and discussed the procedure including the risks, benefits and alternatives for the proposed anesthesia with the patient or authorized representative who has indicated his/her understanding and acceptance.     Dental advisory given  Plan Discussed with: CRNA, Anesthesiologist and Surgeon  Anesthesia Plan Comments: (See PAT note 10/03/2019, Konrad Felix, PA-C)      Anesthesia Quick Evaluation

## 2019-10-04 NOTE — Progress Notes (Signed)
Anesthesia Chart Review   Case: [id number] Date/Time: 10/11/19 0715   Procedure: LAPAROSCOPIC CHOLECYSTECTOMY WITH INTRAOPERATIVE CHOLANGIOGRAM (N/A )   Anesthesia type: General   Pre-op diagnosis: CHRONIC CHOLECYSTITIS, CHOLELITHIASIS   Location: [facility] ROOM 01 / [facility]   Surgeons: Armandina Gemma, MD      DISCUSSION:74 y.o. former smoker with h/o OSA, pulmonary HTN (normal by echo 03/2019), CAD, Moderate AS (valve area 1.08 cm2, mean gradient 25.0 mmHg, peak gradient 42.0 mmHg on Echo 03/2019), HTN, CHF s/p Medtronic PPM 11/2018 (device orders in progress note 09/30/19 and on chart), chronic cholecystitis scheduled for above procedure 10/11/2019 with Dr. Armandina Gemma.   Pt last seen by cardiology 09/25/2019.  Per OV note, "doing well without recent anginal symptoms. Continue ASA, BB, Imdur, statin. Pre-op team recently cleared him for upcoming lap chole. He is not having any concerning anginal symptoms and exercises 5 days a week. I anticipate that he can be cleared for lap chole without further CV testing at this time. Addendum: given h/o NSVT, I also reviewed with Dr. Radford Pax - she agrees with plan as previously outlined. No CV testing needed before surgery. Electrolytes came back normal. She has already cleared in in our original pre-op clearance to hold aspirin if needed for procedure. Continue beta blocker perioperatively. Will cc this note to his surgical team as well."  Anticipate pt can proceed with planned procedure barring acute status change.   VS: BP (P) 136/72   Pulse (P) 81   Temp (P) 37.1 C (Oral)   Resp (P) 18   Ht (P) 6' (1.829 m)   Wt (!) (P) 139.8 kg   SpO2 (P) 95%   BMI (P) 41.81 kg/m   PROVIDERS: Lajean Manes, MD is PCP   Fransico Him, MD is Cardiologist  LABS: Labs reviewed: Acceptable for surgery. (all labs ordered are listed, but only abnormal results are displayed)  Labs Reviewed  BASIC METABOLIC PANEL  CBC     IMAGES:   EKG: 09/25/2019 Rate 62 bpm  NSR   1st degree AVB  CV: Echo 03/11/2019 IMPRESSIONS    1. Left ventricular ejection fraction, by visual estimation, is 60 to  65%. The left ventricle has normal function. There is moderately increased  left ventricular hypertrophy.  2. Definity contrast agent was given IV to delineate the left ventricular  endocardial borders.  3. Left ventricular diastolic parameters are consistent with Grade I  diastolic dysfunction (impaired relaxation).  4. Global right ventricle has normal systolic function.The right  ventricular size is normal. No increase in right ventricular wall  thickness.  5. Left atrial size was normal.  6. Right atrial size was normal.  7. The mitral valve is abnormal. Trace mitral valve regurgitation.  8. The tricuspid valve is grossly normal. Tricuspid valve regurgitation  is trivial.  9. Aortic valve area, by VTI measures 1.08 cm.  10. Aortic valve mean gradient measures 25.0 mmHg.  11. Aortic valve peak gradient measures 42.0 mmHg.  12. The aortic valve is tricuspid. Aortic valve regurgitation is trivial.  Moderate aortic valve stenosis.  13. The pulmonic valve was grossly normal. Pulmonic valve regurgitation is  not visualized.  14. Aortic dilatation noted.  15. There is mild dilatation of the aortic root measuring 40 mm.  16. Normal pulmonary artery systolic pressure.  17. A pacer wire is visualized.  18. The inferior vena cava is normal in size with <50% respiratory  variability, suggesting right atrial pressure of 8 mmHg.   Cardiac Cath 03/23/2018  Conclusions: 1. Severe single-vessel coronary artery disease, with chronic total occlusion of the proximal to mid LAD with bridging, left-to-left, and right-to-left collaterals. 2. Moderate, non-obstructive disease involving the proximal LAD, proximal LCx, and distal RCA.  More severe disease noted involving small branch vessels (RV marginal and superior branch of OM3). 3. Inability to cross the aortic  valve for left heart catheterization due to insufficient catheter length complicated by tortuous subclavian artery and dilated ascending aorta.  LVEF noted to be normal on recent echocardiogram.  Recommendations: 1. Medical therapy and secondary prevention.  I will start isosorbide mononitrate 30 mg daily. 2. If Mr. Talamo continues to have symptoms despite optimal medical therapy (max tolerated doses of at least 2 antianginal agents), PCI to CTO of the LAD should be considered. 3. Follow-up with Dr. Radford Pax or APP in ~2 weeks.   Myocardial Perfusion 03/20/2018  The left ventricular ejection fraction is mildly decreased (45-54%).  Nuclear stress EF: 47%.  Blood pressure demonstrated a normal response to exercise.  ST segment depression of 2 mm was noted during stress in the II, III, V4, V5 and V6 leads.  T wave inversion was noted during stress.  Findings consistent with ischemia.  This is a high risk study. Past Medical History:  Diagnosis Date  . Abdominal hernia    Midline  . Adrenal incidentaloma ([place]) 2010  . Aortic stenosis    mild by echo 02/2018  . Ascending aortic aneurysm ([place])    a. 4.3cm by imaging 03/2019.  Marland Kitchen CAD (coronary artery disease)    a. Cath 03/2018 - CTO of the prox-mid LAD with bridging, L-L and R-L collaterals, moderate, non-obstructive disease involving the proximal LAD, proximal LCx, and distal RCA, more severe disease noted involving small branch vessels (RV marginal and superior branch of OM3).  . Complete heart block ([place])   . Essential hypertension   . Gallstones   . Heart murmur   . History of pacemaker    a. 11/2018 - Medtronic, intermittent CHB.  Marland Kitchen Hyperlipidemia   . Morbid obesity ([place])   . Myocardial infarction ([place])   . NSVT (nonsustained ventricular tachycardia) ([place])   . OSA (obstructive sleep apnea)    Severe PSG 1/07 AHI 66/hr, O2 [place] 50% Refuses treatment - Pt does not believe test was accurate  . Pericarditis    now on  colchicine  . Pneumonia   . Pulmonary HTN ([place])    mild with PASP 85mmHg by echo 02/2018  . Pulmonary nodule   . Screening for AAA (abdominal aortic aneurysm) 2010   CT Abd/Pelvis  . Ventral hernia    03/2009    Past Surgical History:  Procedure Laterality Date  . CORONARY ANGIOGRAPHY N/A 03/23/2018   Procedure: CORONARY ANGIOGRAPHY;  Surgeon: Nelva Bush, MD;  Location: [facility];  Service: Cardiovascular;  Laterality: N/A;  . HERNIA REPAIR  1/04   three  . KNEE ARTHROSCOPY Left 05/04  . PACEMAKER IMPLANT N/A 11/08/2018   Procedure: PACEMAKER IMPLANT;  Surgeon: Constance Haw, MD;  Location: [facility];  Service: Cardiovascular;  Laterality: N/A;  . TOTAL KNEE ARTHROPLASTY Bilateral 05/12  . VENTRAL HERNIA REPAIR  03/2009    MEDICATIONS: . acetaminophen (TYLENOL) 500 MG tablet  . aspirin EC 81 MG tablet  . Coenzyme Q10 (CO Q 10) 100 MG CAPS  . isosorbide mononitrate (IMDUR) 30 MG 24 hr tablet  . lisinopril (ZESTRIL) 5 MG tablet  . metoprolol succinate (TOPROL XL) 25 MG 24 hr  tablet  . Multiple Vitamin (MULTIVITAMIN) tablet  . nitroGLYCERIN (NITROSTAT) 0.4 MG SL tablet  . rosuvastatin (CRESTOR) 10 MG tablet   No current facility-administered medications for this encounter.    Maia Plan [facility] Pre-Surgical Testing [phone] 10/04/19  3:05 PM

## 2019-10-05 ENCOUNTER — Encounter (HOSPITAL_COMMUNITY): Payer: Self-pay | Admitting: Surgery

## 2019-10-05 DIAGNOSIS — K8010 Calculus of gallbladder with chronic cholecystitis without obstruction: Secondary | ICD-10-CM | POA: Diagnosis present

## 2019-10-05 NOTE — H&P (Signed)
General Surgery [facility]  Clarence Andrade DOB: Sep 29, 1945 Married / Language: English / Race: White Male   History of Present Illness   The patient is a 74 year old male who presents for evaluation of gall stones.  CHIEF COMPLAINT: symptomatic gallstones  Patient is referred by Dr. Lajean Manes for surgical evaluation and management of symptomatic cholelithiasis and chronic cholecystitis. Patient's cardiologist is Dr. Golden Hurter. Patient experienced an episode of right upper quadrant abdominal pain radiating to the back in early April 2021. Discomfort lasted 7-10 days. Patient had had similar such episodes previously which lasted 1-2 days. He denies any nausea or vomiting. He denies fevers or chills. He denies any jaundice or acholic stools. There is no history of hepatitis or pancreatitis. Patient was seen by his primary care physician and underwent an ultrasound examination on August 13, 2019. This showed multiple gallstones measuring up to 1.5 cm in size. Also noted was what appeared to be a hypoechoic mass in the central portion of the liver measuring 5.9 cm. Therefore the patient was sent for MRI of the abdomen which was performed on September 04, 2019. No liver abnormality was identified, specifically no 6 cm liver mass. There were stones within the gallbladder. There were also small cystic lesions within the pancreas. Patient is now referred for consideration for cholecystectomy for treatment of biliary colic and chronic cholecystitis and cholelithiasis. Previous abdominal surgery includes inguinal hernia repair and ventral hernia repair with mesh. Patient is accompanied by his wife. I performed her cholecystectomy in 1993.   Past Surgical History  Nephrectomy  Bilateral. Open Inguinal Hernia Surgery  Bilateral. Shoulder Surgery  Right. Tonsillectomy  Vasectomy  Ventral / Umbilical Hernia Surgery  Left.  Diagnostic Studies History   Colonoscopy  1-5 years ago  Allergies  No Known Drug Allergies  Medication History  Isosorbide Mononitrate ER (30MG  Tablet ER 24HR, Oral) Active. Rosuvastatin Calcium (10MG  Tablet, Oral) Active. Metoprolol Succinate ER (25MG  Tablet ER 24HR, Oral) Active. Aspirin (81MG  Tablet DR, Oral) Active. Lisinopril (5MG  Tablet, Oral) Active. Multi Vitamin (Oral) Active. Ubiquinol (100MG  Capsule, Oral) Active. Medications Reconciled  Social History Alcohol use  Occasional alcohol use. Caffeine use  Coffee. No drug use  Tobacco use  Former smoker.  Family History  Breast Cancer  Sister. Heart Disease  Father. Migraine Headache  Father. Respiratory Condition  Mother.  Other Problems  Back Pain  Heart murmur  Hemorrhoids  Hypercholesterolemia  Inguinal Hernia  Myocardial infarction  Ventral Hernia Repair   Review of Systems  General Not Present- Appetite Loss, Chills, Fatigue, Fever, Night Sweats, Weight Gain and Weight Loss. Skin Not Present- Change in Wart/Mole, Dryness, Hives, Jaundice, New Lesions, Non-Healing Wounds, Rash and Ulcer. HEENT Not Present- Earache, Hearing Loss, Hoarseness, Nose Bleed, Oral Ulcers, Ringing in the Ears, Seasonal Allergies, Sinus Pain, Sore Throat, Visual Disturbances, Wears glasses/contact lenses and Yellow Eyes. Respiratory Not Present- Bloody sputum, Chronic Cough, Difficulty Breathing, Snoring and Wheezing. Breast Not Present- Breast Mass, Breast Pain, Nipple Discharge and Skin Changes. Cardiovascular Not Present- Chest Pain, Difficulty Breathing Lying Down, Leg Cramps, Palpitations, Rapid Heart Rate, Shortness of Breath and Swelling of Extremities. Gastrointestinal Not Present- Abdominal Pain, Bloating, Bloody Stool, Change in Bowel Habits, Chronic diarrhea, Constipation, Difficulty Swallowing, Excessive gas, Gets full quickly at meals, Hemorrhoids, Indigestion, Nausea, Rectal Pain and Vomiting. Male Genitourinary Not Present-  Blood in Urine, Change in Urinary Stream, Frequency, Impotence, Nocturia, Painful Urination, Urgency and Urine Leakage. Musculoskeletal Not Present- Back Pain, Joint Pain,  Joint Stiffness, Muscle Pain, Muscle Weakness and Swelling of Extremities. Neurological Not Present- Decreased Memory, Fainting, Headaches, Numbness, Seizures, Tingling, Tremor, Trouble walking and Weakness. Psychiatric Not Present- Anxiety, Bipolar, Change in Sleep Pattern, Depression, Fearful and Frequent crying. Endocrine Not Present- Cold Intolerance, Excessive Hunger, Hair Changes, Heat Intolerance, Hot flashes and New Diabetes. Hematology Not Present- Blood Thinners, Easy Bruising, Excessive bleeding, Gland problems, HIV and Persistent Infections.  Vitals  Weight: 308 lb Height: 72in Body Surface Area: 2.56 m Body Mass Index: 41.77 kg/m  Temp.: 98.87F (Temporal)  Pulse: 76 (Regular)  P.OX: 94% (Room air) BP: 148/94(Sitting, Right Arm, Standard)  Physical Exam  GENERAL APPEARANCE Development: normal Nutritional status: normal Gross deformities: none  SKIN Rash, lesions, ulcers: none Induration, erythema: none Nodules: none palpable  EYES Conjunctiva and lids: normal Pupils: equal and reactive Iris: normal bilaterally  EARS, NOSE, MOUTH, THROAT External ears: no lesion or deformity External nose: no lesion or deformity Hearing: grossly normal Due to Covid-19 pandemic, patient is wearing a mask.  NECK Symmetric: yes Trachea: midline Thyroid: no palpable nodules in the thyroid bed  CHEST Respiratory effort: normal Retraction or accessory muscle use: no Breath sounds: normal bilaterally Rales, rhonchi, wheeze: none Pacemaker left upper chest wall  CARDIOVASCULAR Auscultation: regular rhythm, normal rate Murmurs: none Pulses: radial pulse 2+ palpable Lower extremity edema: none  ABDOMEN Distension: none Masses: none palpable Tenderness: none Hepatosplenomegaly: not  present Hernia: not present Well-healed upper midline surgical incision without evidence of recurrent hernia  MUSCULOSKELETAL Station and gait: normal Digits and nails: no clubbing or cyanosis Muscle strength: grossly normal all extremities Range of motion: grossly normal all extremities Deformity: none  LYMPHATIC Cervical: none palpable Supraclavicular: none palpable  PSYCHIATRIC Oriented to person, place, and time: yes Mood and affect: normal for situation Judgment and insight: appropriate for situation    Assessment & Plan   CHOLELITHIASIS WITH CHRONIC CHOLECYSTITIS (K80.10)  Patient is referred by his primary care physician for surgical evaluation and management of chronic cholecystitis and cholelithiasis. Patient was provided with written literature on gallbladder surgery to review at home. He is accompanied by his wife.  Patient has multiple gallstones. These are documented both on ultrasound and on MRI scan. There is no evidence of choledocholithiasis. There is no sign of liver mass. Patient has had intermittent episodes of biliary colic without complications. We discussed proceeding with laparoscopic cholecystectomy with intraoperative cholangiography. We discussed the possibility of conversion to open surgery. We discussed the procedure and the hospital stay to be anticipated. We discussed his postoperative recovery and return to normal activity. Patient understands and wishes to proceed with surgery in the near future.  We will request cardiac clearance from his cardiologist. He is scheduled for an evaluation at their office next week.  The risks and benefits of the procedure have been discussed at length with the patient. The patient understands the proposed procedure, potential alternative treatments, and the course of recovery to be expected. All of the patient's questions have been answered at this time. The patient wishes to proceed with  surgery.   Armandina Gemma, MD [facility] Office: [phone]

## 2019-10-08 ENCOUNTER — Other Ambulatory Visit (HOSPITAL_COMMUNITY)
Admission: RE | Admit: 2019-10-08 | Discharge: 2019-10-08 | Disposition: A | Discharge status: Home / Self Care | Payer: PPO | Source: Ambulatory Visit | Attending: Surgery | Admitting: Surgery

## 2019-10-08 DIAGNOSIS — Z01812 Encounter for preprocedural laboratory examination: Secondary | ICD-10-CM | POA: Diagnosis not present

## 2019-10-08 DIAGNOSIS — Z20822 Contact with and (suspected) exposure to covid-19: Secondary | ICD-10-CM | POA: Diagnosis not present

## 2019-10-08 LAB — SARS CORONAVIRUS 2 (TAT 6-24 HRS): SARS Coronavirus 2: NEGATIVE

## 2019-10-10 ENCOUNTER — Encounter (HOSPITAL_COMMUNITY): Payer: Self-pay | Admitting: Surgery

## 2019-10-10 MED ORDER — DEXTROSE 5 % IV SOLN
3.0000 g | INTRAVENOUS | Status: AC
  Administered 2019-10-11: 3 g via INTRAVENOUS
  Filled 2019-10-10: qty 3

## 2019-10-11 ENCOUNTER — Encounter (HOSPITAL_COMMUNITY)
Admission: RE | Disposition: A | Discharge status: Home / Self Care | Payer: Self-pay | Source: Home / Self Care | Attending: Surgery

## 2019-10-11 ENCOUNTER — Ambulatory Visit (HOSPITAL_COMMUNITY): Payer: PPO | Admitting: Physician Assistant

## 2019-10-11 ENCOUNTER — Ambulatory Visit (HOSPITAL_COMMUNITY): Payer: PPO

## 2019-10-11 ENCOUNTER — Ambulatory Visit (HOSPITAL_COMMUNITY)
Admission: RE | Admit: 2019-10-11 | Discharge: 2019-10-11 | Disposition: A | Discharge status: Home / Self Care | Payer: PPO | Attending: Surgery | Admitting: Surgery

## 2019-10-11 ENCOUNTER — Encounter (HOSPITAL_COMMUNITY): Payer: Self-pay | Admitting: Surgery

## 2019-10-11 ENCOUNTER — Ambulatory Visit (HOSPITAL_COMMUNITY): Payer: PPO | Admitting: Certified Registered Nurse Anesthetist

## 2019-10-11 ENCOUNTER — Other Ambulatory Visit: Payer: Self-pay

## 2019-10-11 DIAGNOSIS — K8064 Calculus of gallbladder and bile duct with chronic cholecystitis without obstruction: Secondary | ICD-10-CM

## 2019-10-11 DIAGNOSIS — I10 Essential (primary) hypertension: Secondary | ICD-10-CM | POA: Insufficient documentation

## 2019-10-11 DIAGNOSIS — E7800 Pure hypercholesterolemia, unspecified: Secondary | ICD-10-CM | POA: Insufficient documentation

## 2019-10-11 DIAGNOSIS — I272 Pulmonary hypertension, unspecified: Secondary | ICD-10-CM | POA: Diagnosis not present

## 2019-10-11 DIAGNOSIS — Z9049 Acquired absence of other specified parts of digestive tract: Secondary | ICD-10-CM | POA: Diagnosis not present

## 2019-10-11 DIAGNOSIS — K811 Chronic cholecystitis: Secondary | ICD-10-CM | POA: Insufficient documentation

## 2019-10-11 DIAGNOSIS — Z79899 Other long term (current) drug therapy: Secondary | ICD-10-CM | POA: Insufficient documentation

## 2019-10-11 DIAGNOSIS — Z950 Presence of cardiac pacemaker: Secondary | ICD-10-CM | POA: Insufficient documentation

## 2019-10-11 DIAGNOSIS — Z87891 Personal history of nicotine dependence: Secondary | ICD-10-CM | POA: Diagnosis not present

## 2019-10-11 DIAGNOSIS — Z8249 Family history of ischemic heart disease and other diseases of the circulatory system: Secondary | ICD-10-CM | POA: Insufficient documentation

## 2019-10-11 DIAGNOSIS — K915 Postcholecystectomy syndrome: Secondary | ICD-10-CM | POA: Diagnosis not present

## 2019-10-11 DIAGNOSIS — K8010 Calculus of gallbladder with chronic cholecystitis without obstruction: Secondary | ICD-10-CM | POA: Diagnosis not present

## 2019-10-11 DIAGNOSIS — G4730 Sleep apnea, unspecified: Secondary | ICD-10-CM | POA: Diagnosis not present

## 2019-10-11 DIAGNOSIS — I350 Nonrheumatic aortic (valve) stenosis: Secondary | ICD-10-CM | POA: Diagnosis not present

## 2019-10-11 DIAGNOSIS — Z7982 Long term (current) use of aspirin: Secondary | ICD-10-CM | POA: Insufficient documentation

## 2019-10-11 DIAGNOSIS — I2510 Atherosclerotic heart disease of native coronary artery without angina pectoris: Secondary | ICD-10-CM | POA: Insufficient documentation

## 2019-10-11 DIAGNOSIS — I252 Old myocardial infarction: Secondary | ICD-10-CM | POA: Diagnosis not present

## 2019-10-11 DIAGNOSIS — Z6841 Body Mass Index (BMI) 40.0 and over, adult: Secondary | ICD-10-CM | POA: Insufficient documentation

## 2019-10-11 DIAGNOSIS — K862 Cyst of pancreas: Secondary | ICD-10-CM | POA: Diagnosis not present

## 2019-10-11 HISTORY — PX: CHOLECYSTECTOMY: SHX55

## 2019-10-11 SURGERY — LAPAROSCOPIC CHOLECYSTECTOMY WITH INTRAOPERATIVE CHOLANGIOGRAM
Anesthesia: General | Site: Abdomen

## 2019-10-11 MED ORDER — ACETAMINOPHEN 650 MG RE SUPP: 650.0000 mg | Freq: Four times a day (QID) | RECTAL | Status: DC | PRN

## 2019-10-11 MED ORDER — OXYCODONE HCL 5 MG PO TABS: 5.0000 mg | ORAL_TABLET | Freq: Once | ORAL | Status: DC | PRN

## 2019-10-11 MED ORDER — FENTANYL CITRATE (PF) 100 MCG/2ML IJ SOLN: 25.0000 ug | INTRAMUSCULAR | Status: DC | PRN

## 2019-10-11 MED ORDER — ONDANSETRON HCL 4 MG/2ML IJ SOLN
INTRAMUSCULAR | Status: DC | PRN
  Administered 2019-10-11: 4 mg via INTRAVENOUS

## 2019-10-11 MED ORDER — PHENYLEPHRINE HCL (PRESSORS) 10 MG/ML IV SOLN
INTRAVENOUS | Status: AC
  Filled 2019-10-11: qty 1

## 2019-10-11 MED ORDER — ACETAMINOPHEN 325 MG PO TABS: 650.0000 mg | ORAL_TABLET | Freq: Four times a day (QID) | ORAL | Status: DC | PRN

## 2019-10-11 MED ORDER — LIDOCAINE 2% (20 MG/ML) 5 ML SYRINGE
INTRAMUSCULAR | Status: AC
  Filled 2019-10-11: qty 5

## 2019-10-11 MED ORDER — CHLORHEXIDINE GLUCONATE CLOTH 2 % EX PADS: 6.0000 | MEDICATED_PAD | Freq: Once | CUTANEOUS | Status: DC

## 2019-10-11 MED ORDER — HYDROMORPHONE HCL 1 MG/ML IJ SOLN: 1.0000 mg | INTRAMUSCULAR | Status: DC | PRN

## 2019-10-11 MED ORDER — ROCURONIUM BROMIDE 10 MG/ML (PF) SYRINGE
PREFILLED_SYRINGE | INTRAVENOUS | Status: DC | PRN
  Administered 2019-10-11: 10 mg via INTRAVENOUS
  Administered 2019-10-11: 50 mg via INTRAVENOUS

## 2019-10-11 MED ORDER — METOPROLOL SUCCINATE ER 25 MG PO TB24: 25.0000 mg | ORAL_TABLET | Freq: Every day | ORAL | Status: DC

## 2019-10-11 MED ORDER — LIDOCAINE 2% (20 MG/ML) 5 ML SYRINGE
INTRAMUSCULAR | Status: DC | PRN
  Administered 2019-10-11: 80 mg via INTRAVENOUS

## 2019-10-11 MED ORDER — ISOSORBIDE MONONITRATE ER 30 MG PO TB24: 30.0000 mg | ORAL_TABLET | Freq: Every day | ORAL | Status: DC

## 2019-10-11 MED ORDER — OXYCODONE HCL 5 MG PO TABS: 5.0000 mg | ORAL_TABLET | ORAL | Status: DC | PRN

## 2019-10-11 MED ORDER — ONDANSETRON HCL 4 MG/2ML IJ SOLN: 4.0000 mg | Freq: Once | INTRAMUSCULAR | Status: DC | PRN

## 2019-10-11 MED ORDER — FENTANYL CITRATE (PF) 250 MCG/5ML IJ SOLN
INTRAMUSCULAR | Status: AC
  Filled 2019-10-11: qty 5

## 2019-10-11 MED ORDER — LISINOPRIL 5 MG PO TABS: 5.0000 mg | ORAL_TABLET | Freq: Every day | ORAL | Status: DC

## 2019-10-11 MED ORDER — SUGAMMADEX SODIUM 200 MG/2ML IV SOLN
INTRAVENOUS | Status: DC | PRN
  Administered 2019-10-11: 300 mg via INTRAVENOUS

## 2019-10-11 MED ORDER — CHLORHEXIDINE GLUCONATE 0.12 % MT SOLN
15.0000 mL | Freq: Once | OROMUCOSAL | Status: AC
  Administered 2019-10-11: 15 mL via OROMUCOSAL

## 2019-10-11 MED ORDER — BUPIVACAINE-EPINEPHRINE 0.5% -1:200000 IJ SOLN
INTRAMUSCULAR | Status: DC | PRN
  Administered 2019-10-11: 20 mL

## 2019-10-11 MED ORDER — BUPIVACAINE-EPINEPHRINE 0.5% -1:200000 IJ SOLN
INTRAMUSCULAR | Status: AC
  Filled 2019-10-11: qty 1

## 2019-10-11 MED ORDER — PROPOFOL 10 MG/ML IV BOLUS
INTRAVENOUS | Status: DC | PRN
  Administered 2019-10-11: 160 mg via INTRAVENOUS

## 2019-10-11 MED ORDER — TRAMADOL HCL 50 MG PO TABS: 50.0000 mg | ORAL_TABLET | Freq: Four times a day (QID) | ORAL | Status: DC | PRN

## 2019-10-11 MED ORDER — OXYCODONE HCL 5 MG/5ML PO SOLN: 5.0000 mg | Freq: Once | ORAL | Status: DC | PRN

## 2019-10-11 MED ORDER — LIP MEDEX EX OINT
TOPICAL_OINTMENT | CUTANEOUS | Status: AC
  Filled 2019-10-11: qty 7

## 2019-10-11 MED ORDER — LACTATED RINGERS IV SOLN: INTRAVENOUS | Status: DC

## 2019-10-11 MED ORDER — PHENYLEPHRINE HCL-NACL 10-0.9 MG/250ML-% IV SOLN
INTRAVENOUS | Status: DC | PRN
Start: 2019-10-11 — End: 2019-10-11
  Administered 2019-10-11: 60 ug/min via INTRAVENOUS

## 2019-10-11 MED ORDER — 0.9 % SODIUM CHLORIDE (POUR BTL) OPTIME
TOPICAL | Status: DC | PRN
  Administered 2019-10-11: 1000 mL

## 2019-10-11 MED ORDER — ROCURONIUM BROMIDE 10 MG/ML (PF) SYRINGE
PREFILLED_SYRINGE | INTRAVENOUS | Status: AC
  Filled 2019-10-11: qty 10

## 2019-10-11 MED ORDER — FENTANYL CITRATE (PF) 100 MCG/2ML IJ SOLN
INTRAMUSCULAR | Status: DC | PRN
  Administered 2019-10-11 (×2): 50 ug via INTRAVENOUS
  Administered 2019-10-11: 100 ug via INTRAVENOUS
  Administered 2019-10-11 (×3): 50 ug via INTRAVENOUS

## 2019-10-11 MED ORDER — ONDANSETRON 4 MG PO TBDP: 4.0000 mg | ORAL_TABLET | Freq: Four times a day (QID) | ORAL | Status: DC | PRN

## 2019-10-11 MED ORDER — ORAL CARE MOUTH RINSE: 15.0000 mL | Freq: Once | OROMUCOSAL | Status: AC

## 2019-10-11 MED ORDER — SUGAMMADEX SODIUM 500 MG/5ML IV SOLN
INTRAVENOUS | Status: AC
  Filled 2019-10-11: qty 5

## 2019-10-11 MED ORDER — DEXAMETHASONE SODIUM PHOSPHATE 10 MG/ML IJ SOLN
INTRAMUSCULAR | Status: DC | PRN
  Administered 2019-10-11: 8 mg via INTRAVENOUS

## 2019-10-11 MED ORDER — SUCCINYLCHOLINE CHLORIDE 200 MG/10ML IV SOSY
PREFILLED_SYRINGE | INTRAVENOUS | Status: AC
  Filled 2019-10-11: qty 10

## 2019-10-11 MED ORDER — PROPOFOL 10 MG/ML IV BOLUS
INTRAVENOUS | Status: AC
  Filled 2019-10-11: qty 20

## 2019-10-11 MED ORDER — FENTANYL CITRATE (PF) 100 MCG/2ML IJ SOLN
INTRAMUSCULAR | Status: AC
  Filled 2019-10-11: qty 2

## 2019-10-11 MED ORDER — ONDANSETRON HCL 4 MG/2ML IJ SOLN: 4.0000 mg | Freq: Four times a day (QID) | INTRAMUSCULAR | Status: DC | PRN

## 2019-10-11 MED ORDER — DEXAMETHASONE SODIUM PHOSPHATE 10 MG/ML IJ SOLN
INTRAMUSCULAR | Status: AC
  Filled 2019-10-11: qty 1

## 2019-10-11 MED ORDER — TRAMADOL HCL 50 MG PO TABS: 50.0000 mg | ORAL_TABLET | Freq: Four times a day (QID) | ORAL | 0 refills | Status: DC | PRN

## 2019-10-11 MED ORDER — SUCCINYLCHOLINE CHLORIDE 200 MG/10ML IV SOSY
PREFILLED_SYRINGE | INTRAVENOUS | Status: DC | PRN
  Administered 2019-10-11: 140 mg via INTRAVENOUS

## 2019-10-11 MED ORDER — IOHEXOL 300 MG/ML  SOLN
INTRAMUSCULAR | Status: DC | PRN
  Administered 2019-10-11: 5 mL

## 2019-10-11 MED ORDER — LACTATED RINGERS IR SOLN
Status: DC | PRN
  Administered 2019-10-11: 1000 mL

## 2019-10-11 MED ORDER — SODIUM CHLORIDE 0.45 % IV SOLN: INTRAVENOUS | Status: DC

## 2019-10-11 SURGICAL SUPPLY — 34 items
APPLIER CLIP ROT 10 11.4 M/L (STAPLE) ×2
CABLE HIGH FREQUENCY MONO STRZ (ELECTRODE) ×2 IMPLANT
CHLORAPREP W/TINT 26 (MISCELLANEOUS) ×4 IMPLANT
CLIP APPLIE ROT 10 11.4 M/L (STAPLE) ×1 IMPLANT
COVER MAYO STAND STRL (DRAPES) ×2 IMPLANT
COVER SURGICAL LIGHT HANDLE (MISCELLANEOUS) ×2 IMPLANT
COVER WAND RF STERILE (DRAPES) IMPLANT
DECANTER SPIKE VIAL GLASS SM (MISCELLANEOUS) ×2 IMPLANT
DERMABOND ADVANCED (GAUZE/BANDAGES/DRESSINGS) ×1
DERMABOND ADVANCED .7 DNX12 (GAUZE/BANDAGES/DRESSINGS) ×1 IMPLANT
DRAPE C-ARM 42X120 X-RAY (DRAPES) ×2 IMPLANT
ELECT REM PT RETURN 15FT ADLT (MISCELLANEOUS) ×2 IMPLANT
GAUZE SPONGE 2X2 8PLY STRL LF (GAUZE/BANDAGES/DRESSINGS) ×1 IMPLANT
GLOVE SURG ORTHO 8.0 STRL STRW (GLOVE) ×2 IMPLANT
GOWN STRL REUS W/TWL XL LVL3 (GOWN DISPOSABLE) ×4 IMPLANT
HEMOSTAT SURGICEL 4X8 (HEMOSTASIS) IMPLANT
KIT BASIN (CUSTOM PROCEDURE TRAY) ×2 IMPLANT
KIT TURNOVER KIT A (KITS) IMPLANT
PENCIL SMOKE EVACUATOR (MISCELLANEOUS) IMPLANT
POUCH SPECIMEN RETRIEVAL 10MM (ENDOMECHANICALS) ×2 IMPLANT
SCISSORS LAP 5X35 DISP (ENDOMECHANICALS) ×2 IMPLANT
SET CHOLANGIOGRAPH MIX (MISCELLANEOUS) ×2 IMPLANT
SET IRRIG TUBING LAPAROSCOPIC (IRRIGATION / IRRIGATOR) ×2 IMPLANT
SET TUBE SMOKE EVAC HIGH FLOW (TUBING) IMPLANT
SLEEVE XCEL OPT CAN 5 100 (ENDOMECHANICALS) ×2 IMPLANT
SPONGE GAUZE 2X2 STER 10/PKG (GAUZE/BANDAGES/DRESSINGS) ×1
STRIP CLOSURE SKIN 1/2X4 (GAUZE/BANDAGES/DRESSINGS) IMPLANT
SUT MNCRL AB 4-0 PS2 18 (SUTURE) ×2 IMPLANT
TOWEL OR 17X26 10 PK STRL BLUE (TOWEL DISPOSABLE) ×2 IMPLANT
TOWEL OR NON WOVEN STRL DISP B (DISPOSABLE) ×2 IMPLANT
TRAY LAPAROSCOPIC (CUSTOM PROCEDURE TRAY) ×2 IMPLANT
TROCAR BLADELESS OPT 5 100 (ENDOMECHANICALS) ×2 IMPLANT
TROCAR XCEL BLUNT TIP 100MML (ENDOMECHANICALS) ×2 IMPLANT
TROCAR XCEL NON-BLD 11X100MML (ENDOMECHANICALS) ×2 IMPLANT

## 2019-10-11 NOTE — Discharge Instructions (Signed)
[facility]  LAPAROSCOPIC SURGERY:  POST-OP INSTRUCTIONS  Always review your discharge instruction sheet given to you by the facility where your surgery was performed.  A prescription for pain medication may be given to you upon discharge.  Take your pain medication as prescribed.  If narcotic pain medicine is not needed, then you may take acetaminophen (Tylenol) or ibuprofen (Advil) as needed.  Take your usually prescribed medications unless otherwise directed.  If you need a refill on your pain medication, please contact your pharmacy.  They will contact our office to request authorization. Prescriptions will not be filled after 5 P.M. or on weekends.  You should follow a light diet the first few days after arrival home, such as soup and crackers or toast.  Be sure to include plenty of fluids daily.  Most patients will experience some swelling and bruising in the area of the incisions.  Ice packs will help.  Swelling and bruising can take several days to resolve.   It is common to experience some constipation after surgery.  Increasing fluid intake and taking a stool softener (such as Colace) will usually help or prevent this problem from occurring.  A mild laxative (Milk of Magnesia or Miralax) should be taken according to package instructions if there has been no bowel movement after 48 hours.  You will likely have Dermabond (topical glue) over your incisions.  This seals the incisions and allows you to bathe and shower at any time after your surgery.  Glue should remain in place for up to 10 days.  It may be removed after 10 days by pealing off the Dermabond material or using Vaseline or naval jelly to remove.  If you have steri-strips over your incisions, you may remove the gauze bandage on the second day after surgery, and you may shower at that time.  Leave your steri-strips (small skin tapes) in place directly over the incision.  These strips should remain on the  skin for 5-7 days and then be removed.  You may get them wet in the shower and pat them dry.  Any sutures or staples will be removed at the office during your follow-up visit.  ACTIVITIES:  You may resume regular (light) daily activities beginning the next day - such as daily self-care, walking, climbing stairs - gradually increasing activities as tolerated.  You may have sexual intercourse when it is comfortable.  Refrain from any heavy lifting or straining until approved by your doctor.  You may drive when you are no longer taking prescription pain medication, when you can comfortably wear a seatbelt, and when you can safely maneuver your car and apply brakes.  You should see your doctor in the office for a follow-up appointment approximately 2-3 weeks after your surgery.  Make sure that you call for this appointment within a day or two after you arrive home to insure a convenient appointment time.  WHEN TO CALL YOUR DOCTOR: 1. Fever over 101.0 2. Inability to urinate 3. Continued bleeding from incision 4. Increased pain, redness, or drainage from the incision 5. Increasing abdominal pain  The clinic staff is available to answer your questions during regular business hours.  Please don't hesitate to call and ask to speak to one of the nurses for clinical concerns.  If you have a medical emergency, go to the nearest emergency room or call 911.  A surgeon from [facility] is always on call for the hospital.  Jayston Trevino M. Heike Pounds, MD, FACS [facility] Office: [phone] Toll Free:  [phone] FAX [phone]  Website: [email or web address] 

## 2019-10-11 NOTE — Discharge Summary (Signed)
Physician Discharge Summary [facility]  Patient ID: Clarence Andrade MRN: [id number] DOB/AGE: 06/19/1945 74 y.o.  Admit date: 10/11/2019 Discharge date: 10/11/2019  Admission Diagnoses:  Chronic cholecystitis, cholelithiasis  Discharge Diagnoses:  Principal Problem:   Cholelithiasis with chronic cholecystitis   Discharged Condition: good  Hospital Course: Patient was admitted for observation following gallbladder surgery.  Post op course was uncomplicated.  Pain was well controlled.  Tolerated diet. Patient was prepared for discharge home on afternoon following surgery.  Consults: None  Treatments: surgery: lap chole with IOC  Discharge Exam: Blood pressure 110/69, pulse 67, temperature 98.2 F (36.8 C), temperature source Oral, resp. rate 18, height 6' (1.829 m), weight (!) 139.8 kg, SpO2 97 %. See nurse's notes.   Disposition: Home  Discharge Instructions    Diet - low sodium heart healthy   Complete by: As directed    Discharge instructions   Complete by: As directed    [facility]  LAPAROSCOPIC SURGERY:  POST-OP INSTRUCTIONS  Always review your discharge instruction sheet given to you by the facility where your surgery was performed.  A prescription for pain medication may be given to you upon discharge.  Take your pain medication as prescribed.  If narcotic pain medicine is not needed, then you may take acetaminophen (Tylenol) or ibuprofen (Advil) as needed.  Take your usually prescribed medications unless otherwise directed.  If you need a refill on your pain medication, please contact your pharmacy.  They will contact our office to request authorization. Prescriptions will not be filled after 5 P.M. or on weekends.  You should follow a light diet the first few days after arrival home, such as soup and crackers or toast.  Be sure to include plenty of fluids daily.  Most patients will experience some swelling and bruising  in the area of the incisions.  Ice packs will help.  Swelling and bruising can take several days to resolve.   It is common to experience some constipation after surgery.  Increasing fluid intake and taking a stool softener (such as Colace) will usually help or prevent this problem from occurring.  A mild laxative (Milk of Magnesia or Miralax) should be taken according to package instructions if there has been no bowel movement after 48 hours.  You will likely have Dermabond (topical glue) over your incisions.  This seals the incisions and allows you to bathe and shower at any time after your surgery.  Glue should remain in place for up to 10 days.  It may be removed after 10 days by pealing off the Dermabond material or using Vaseline or naval jelly to remove.  If you have steri-strips over your incisions, you may remove the gauze bandage on the second day after surgery, and you may shower at that time.  Leave your steri-strips (small skin tapes) in place directly over the incision.  These strips should remain on the skin for 5-7 days and then be removed.  You may get them wet in the shower and pat them dry.  Any sutures or staples will be removed at the office during your follow-up visit.  ACTIVITIES:  You may resume regular (light) daily activities beginning the next day - such as daily self-care, walking, climbing stairs - gradually increasing activities as tolerated.  You may have sexual intercourse when it is comfortable.  Refrain from any heavy lifting or straining until approved by your doctor.  You may drive when you are no  longer taking prescription pain medication, when you can comfortably wear a seatbelt, and when you can safely maneuver your car and apply brakes.  You should see your doctor in the office for a follow-up appointment approximately 2-3 weeks after your surgery.  Make sure that you call for this appointment within a day or two after you arrive home to insure a convenient  appointment time.  WHEN TO CALL YOUR DOCTOR: Fever over 101.0 Inability to urinate Continued bleeding from incision Increased pain, redness, or drainage from the incision Increasing abdominal pain  The clinic staff is available to answer your questions during regular business hours.  Please don't hesitate to call and ask to speak to one of the nurses for clinical concerns.  If you have a medical emergency, go to the nearest emergency room or call 911.  A surgeon from [facility] is always on call for the hospital.  Earnstine Regal, MD, [facility] Office: [place] Free:  [place] [phone]  Website: [email or web address]   Increase activity slowly   Complete by: As directed    No dressing needed   Complete by: As directed      Allergies as of 10/11/2019      Reactions   Sertraline Hcl Other (See Comments)   Tremor, HA, Decreased Libido      Medication List    TAKE these medications   acetaminophen 500 MG tablet Commonly known as: TYLENOL Take 1,000 mg by mouth every 6 (six) hours as needed (pain).   aspirin EC 81 MG tablet Take 81 mg by mouth daily.   Co Q 10 100 MG Caps Take 100 mg by mouth daily.   isosorbide mononitrate 30 MG 24 hr tablet Commonly known as: IMDUR Take 1 tablet (30 mg total) by mouth daily.   lisinopril 5 MG tablet Commonly known as: ZESTRIL Take 5 mg by mouth at bedtime.   metoprolol succinate 25 MG 24 hr tablet Commonly known as: Toprol XL Take 1 tablet (25 mg total) by mouth daily. What changed: when to take this   multivitamin tablet Take 1 tablet by mouth daily.   nitroGLYCERIN 0.4 MG SL tablet Commonly known as: NITROSTAT Place 1 tablet (0.4 mg total) under the tongue every 5 (five) minutes as needed for chest pain.   rosuvastatin 10 MG tablet Commonly known as: CRESTOR TAKE 1 TABLET ONCE DAILY.   traMADol 50 MG tablet Commonly known as: ULTRAM Take 1-2  tablets (50-100 mg total) by mouth every 6 (six) hours as needed for moderate pain.            Discharge Care Instructions  (From admission, onward)         Start     Ordered   10/11/19 0000  No dressing needed     10/11/19 1830         Follow-up Information    Armandina Gemma, MD. Schedule an appointment as soon as possible for a visit in 3 weeks.   Specialty: General Surgery Why: For wound re-check Contact information: [place] [address] [phone]           Earnstine Regal, MD, [facility] Office: [phone]   Signed: Armandina Gemma 10/11/2019, 6:30 PM

## 2019-10-11 NOTE — Progress Notes (Addendum)
Patient is very eager to be discharged. Patients wife states "Dr. Harlow Asa told me after surgery that if Clarence Andrade does well he can leave this evening". I have contacted the office and notified the on-call doctor of this situation.

## 2019-10-11 NOTE — Anesthesia Postprocedure Evaluation (Signed)
Anesthesia Post Note  Patient: Clarence Andrade  Procedure(s) Performed: LAPAROSCOPIC CHOLECYSTECTOMY WITH INTRAOPERATIVE CHOLANGIOGRAM (N/A Abdomen)     Patient location during evaluation: PACU Anesthesia Type: General Level of consciousness: awake and alert and oriented Pain management: pain level controlled Vital Signs Assessment: post-procedure vital signs reviewed and stable Respiratory status: spontaneous breathing, nonlabored ventilation, respiratory function stable and patient connected to nasal cannula oxygen Cardiovascular status: blood pressure returned to baseline and stable Postop Assessment: no apparent nausea or vomiting Anesthetic complications: no    Last Vitals:  Vitals:   10/11/19 0945 10/11/19 1000  BP: 131/71 128/66  Pulse: 60 78  Resp: 11 14  Temp:    SpO2: 97% 95%    Last Pain:  Vitals:   10/11/19 1000  TempSrc:   PainSc: Asleep                 Srijan Givan A.

## 2019-10-11 NOTE — Interval H&P Note (Signed)
History and Physical Interval Note:  10/11/2019 6:59 AM  Clarence Andrade  has presented today for surgery, with the diagnosis of CHRONIC CHOLECYSTITIS, CHOLELITHIASIS.  The various methods of treatment have been discussed with the patient and family. After consideration of risks, benefits and other options for treatment, the patient has consented to    Procedure(s): LAPAROSCOPIC CHOLECYSTECTOMY WITH INTRAOPERATIVE CHOLANGIOGRAM (N/A) as a surgical intervention.    The patient's history has been reviewed, patient examined, no change in status, stable for surgery.  I have reviewed the patient's chart and labs.  Questions were answered to the patient's satisfaction.    Armandina Gemma, MD [facility] Office: [place]

## 2019-10-11 NOTE — Op Note (Signed)
Procedure Note  Pre-operative Diagnosis:  Chronic cholecystitis, cholelithiasis, biliary colic  Post-operative Diagnosis:  same  Surgeon:  Armandina Gemma, MD  Assistant:  none   Procedure:  Laparoscopic cholecystectomy with intra-operative cholangiography  Anesthesia:  General  Estimated Blood Loss:  minimal  Drains: none         Specimen: gallbladder to pathology  Indications:  Patient is referred by Dr. Lajean Manes for surgical evaluation and management of symptomatic cholelithiasis and chronic cholecystitis. Patient's cardiologist is Dr. Golden Hurter. Patient experienced an episode of right upper quadrant abdominal pain radiating to the back in early April 2021. Discomfort lasted 7-10 days. Patient had had similar such episodes previously which lasted 1-2 days. He denies any nausea or vomiting. He denies fevers or chills. He denies any jaundice or acholic stools. There is no history of hepatitis or pancreatitis. Patient was seen by his primary care physician and underwent an ultrasound examination on August 13, 2019. This showed multiple gallstones measuring up to 1.5 cm in size. Also noted was what appeared to be a hypoechoic mass in the central portion of the liver measuring 5.9 cm. Therefore the patient was sent for MRI of the abdomen which was performed on September 04, 2019. No liver abnormality was identified, specifically no 6 cm liver mass. There were stones within the gallbladder. There were also small cystic lesions within the pancreas. Patient is now referred for consideration for cholecystectomy for treatment of biliary colic and chronic cholecystitis and cholelithiasis.   Procedure Details:  The patient was seen in the pre-op holding area. The risks, benefits, complications, treatment options, and expected outcomes were previously discussed with the patient. The patient agreed with the proposed plan and has signed the informed consent form.  The patient was  transported to operating room #1 at the [facility]. The patient was placed in the supine position on the operating room table. Following induction of general anesthesia, the abdomen was prepped and draped in the usual aseptic fashion.  An incision was made in the skin near the umbilicus. The midline fascia was incised and the peritoneal cavity was entered and a Hasson cannula was introduced under direct vision. The cannula was secured with a 0-Vicryl pursestring suture. Pneumoperitoneum was established with carbon dioxide. Additional cannulae were introduced under direct vision along the right costal margin in the midline, mid-clavicular line, and anterior axillary line.   The gallbladder was identified and the fundus grasped and retracted cephalad. Adhesions were taken down bluntly and the electrocautery was utilized as needed, taking care not to involve any adjacent structures. The infundibulum was grasped and retracted laterally, exposing the peritoneum overlying the triangle of Calot. The peritoneum was incised and structures exposed with blunt dissection. The cystic duct was clearly identified, bluntly dissected circumferentially, and clipped at the neck of the gallbladder.  An incision was made in the cystic duct and the cholangiogram catheter introduced. The catheter was secured using an ligaclip.  Real-time cholangiography was performed using C-arm fluoroscopy.  There was rapid filling of a normal caliber common bile duct.  There was reflux of contrast into the left and right hepatic ductal systems.  There was free flow distally into the duodenum without filling defect or obstruction.  The catheter was removed from the peritoneal cavity.  The cystic duct was then ligated with ligaclips and divided. The cystic artery was identified, dissected circumferentially, ligated with ligaclips, and divided.  The gallbladder was dissected away from the gallbladder bed using the electrocautery for  hemostasis. The gallbladder was completely removed from the liver and placed into an endocatch bag. The gallbladder was removed in the endocatch bag through the umbilical port site and submitted to pathology for review.  The right upper quadrant was irrigated and the gallbladder bed was inspected. Hemostasis was achieved with the electrocautery.  Cannulae were removed under direct vision and good hemostasis was noted. Pneumoperitoneum was released and the majority of the carbon dioxide evacuated. The umbilical wound was irrigated and the fascia was then closed with the pursestring suture.  Local anesthetic was infiltrated at all port sites. Skin incisions were closed with 4-0 Monocril subcuticular sutures and Dermabond was applied.  Instrument, sponge, and needle counts were correct at the conclusion of the case.  The patient was awakened from anesthesia and brought to the recovery room in stable condition.  The patient tolerated the procedure well.   Armandina Gemma, MD [facility] Office: [phone]

## 2019-10-11 NOTE — Transfer of Care (Signed)
Immediate Anesthesia Transfer of Care Note  Patient: Clarence Andrade  Procedure(s) Performed: LAPAROSCOPIC CHOLECYSTECTOMY WITH INTRAOPERATIVE CHOLANGIOGRAM (N/A Abdomen)  Patient Location: PACU  Anesthesia Type:General  Level of Consciousness: drowsy and patient cooperative  Airway & Oxygen Therapy: Patient Spontanous Breathing  Post-op Assessment: Report given to RN and Post -op Vital signs reviewed and stable  Post vital signs: Reviewed and stable  Last Vitals:  Vitals Value Taken Time  BP 166/92 10/11/19 0905  Temp    Pulse 67 10/11/19 0909  Resp 13 10/11/19 0909  SpO2 91 % 10/11/19 0909  Vitals shown include unvalidated device data.  Last Pain:  Vitals:   10/11/19 0634  TempSrc:   PainSc: 0-No pain         Complications: No apparent anesthesia complications

## 2019-10-11 NOTE — Anesthesia Procedure Notes (Signed)
Procedure Name: Intubation Date/Time: 10/11/2019 7:27 AM Performed by: Montel Clock, CRNA Pre-anesthesia Checklist: Patient identified, Emergency Drugs available, Suction available, Patient being monitored and Timeout performed Patient Re-evaluated:Patient Re-evaluated prior to induction Oxygen Delivery Method: Circle system utilized Preoxygenation: Pre-oxygenation with 100% oxygen Induction Type: IV induction and Rapid sequence Laryngoscope Size: Mac and 3 Grade View: Grade II Tube type: Oral Tube size: 7.5 mm Number of attempts: 1 Airway Equipment and Method: Bougie stylet Placement Confirmation: ETT inserted through vocal cords under direct vision,  positive ETCO2 and breath sounds checked- equal and bilateral Secured at: 23 cm Tube secured with: Tape Dental Injury: Teeth and Oropharynx as per pre-operative assessment  Difficulty Due To: Difficult Airway- due to anterior larynx Comments: MAC 3, poor grade 2b view, unable to pass ETT, Bougie stylet passed through cords and ETT passed over without difficulty.

## 2019-10-14 LAB — SURGICAL PATHOLOGY

## 2019-11-07 ENCOUNTER — Ambulatory Visit (INDEPENDENT_AMBULATORY_CARE_PROVIDER_SITE_OTHER): Payer: PPO | Admitting: *Deleted

## 2019-11-07 DIAGNOSIS — I442 Atrioventricular block, complete: Secondary | ICD-10-CM

## 2019-11-07 LAB — CUP PACEART REMOTE DEVICE CHECK
Battery Remaining Longevity: 168 mo
Battery Voltage: 3.08 V
Brady Statistic AP VP Percent: 0.54 %
Brady Statistic AP VS Percent: 53.96 %
Brady Statistic AS VP Percent: 1.32 %
Brady Statistic AS VS Percent: 44.17 %
Brady Statistic RA Percent Paced: 54.49 %
Brady Statistic RV Percent Paced: 1.87 %
Date Time Interrogation Session: 20210701070434
Implantable Lead Implant Date: 20200702
Implantable Lead Implant Date: 20200702
Implantable Lead Location: 753859
Implantable Lead Location: 753860
Implantable Lead Model: 5076
Implantable Lead Model: 5076
Implantable Pulse Generator Implant Date: 20200702
Lead Channel Impedance Value: 342 Ohm
Lead Channel Impedance Value: 361 Ohm
Lead Channel Impedance Value: 456 Ohm
Lead Channel Impedance Value: 494 Ohm
Lead Channel Pacing Threshold Amplitude: 0.75 V
Lead Channel Pacing Threshold Amplitude: 0.875 V
Lead Channel Pacing Threshold Pulse Width: 0.4 ms
Lead Channel Pacing Threshold Pulse Width: 0.4 ms
Lead Channel Sensing Intrinsic Amplitude: 4.125 mV
Lead Channel Sensing Intrinsic Amplitude: 4.125 mV
Lead Channel Sensing Intrinsic Amplitude: 8.625 mV
Lead Channel Sensing Intrinsic Amplitude: 8.625 mV
Lead Channel Setting Pacing Amplitude: 1.5 V
Lead Channel Setting Pacing Amplitude: 2 V
Lead Channel Setting Pacing Pulse Width: 0.4 ms
Lead Channel Setting Sensing Sensitivity: 2.8 mV

## 2019-11-08 NOTE — Progress Notes (Signed)
Remote pacemaker transmission.   

## 2019-11-19 ENCOUNTER — Ambulatory Visit (INDEPENDENT_AMBULATORY_CARE_PROVIDER_SITE_OTHER): Payer: PPO | Admitting: Cardiology

## 2019-11-19 ENCOUNTER — Encounter: Payer: Self-pay | Admitting: Cardiology

## 2019-11-19 ENCOUNTER — Other Ambulatory Visit: Payer: Self-pay

## 2019-11-19 VITALS — BP 128/70 | HR 71 | Ht 73.0 in | Wt 301.8 lb

## 2019-11-19 DIAGNOSIS — I442 Atrioventricular block, complete: Secondary | ICD-10-CM

## 2019-11-19 NOTE — Progress Notes (Signed)
Electrophysiology Office Note   Date:  11/19/2019   ID:  Clarence Andrade, DOB Nov 03, 1945, MRN [id number]  PCP:  Lajean Manes, MD  Cardiologist:  Radford Pax Primary Electrophysiologist:  Loren Sawaya Meredith Leeds, MD    No chief complaint on file.    History of Present Illness: Clarence Andrade is a 74 y.o. male who is being seen today for the evaluation of heart block at the request of Cecilie Kicks. Presenting today for electrophysiology evaluation.  He has a history of mild aortic stenosis, coronary artery disease with CTO of the LAD, hyperlipidemia, OSA being referred for intermittent complete heart block.  He says that over the last few months, he is having gray out episodes.  He wore a cardiac monitor that showed intermittent complete heart block.  This is occurred only when he is sitting down, not when he is active.  He is now status post Medtronic dual-chamber pacemaker implanted 11/08/2018.  Today, denies symptoms of palpitations, chest pain, shortness of breath, orthopnea, PND, lower extremity edema, claudication, dizziness, presyncope, syncope, bleeding, or neurologic sequela. The patient is tolerating medications without difficulties.  He currently feels well.  He has no chest pain or shortness of breath.  Is able to do all of his daily activities without restriction.   Past Medical History:  Diagnosis Date  . Abdominal hernia    Midline  . Adrenal incidentaloma ([place]) 2010  . Aortic stenosis    mild by echo 02/2018  . Ascending aortic aneurysm ([place])    a. 4.3cm by imaging 03/2019.  Marland Kitchen CAD (coronary artery disease)    a. Cath 03/2018 - CTO of the prox-mid LAD with bridging, L-L and R-L collaterals, moderate, non-obstructive disease involving the proximal LAD, proximal LCx, and distal RCA, more severe disease noted involving small branch vessels (RV marginal and superior branch of OM3).  . Complete heart block ([place])   . Essential hypertension   . Gallstones   . Heart murmur   .  History of pacemaker    a. 11/2018 - Medtronic, intermittent CHB.  Marland Kitchen Hyperlipidemia   . Morbid obesity ([place])   . Myocardial infarction ([place])   . NSVT (nonsustained ventricular tachycardia) ([place])   . OSA (obstructive sleep apnea)    Severe PSG 1/07 AHI 66/hr, O2 Nadir 50% Refuses treatment - Pt does not believe test was accurate  . Pericarditis    now on colchicine  . Pneumonia   . Pulmonary HTN ([place])    mild with PASP 66mmHg by echo 02/2018  . Pulmonary nodule   . Screening for AAA (abdominal aortic aneurysm) 2010   CT Abd/Pelvis  . Ventral hernia    03/2009   Past Surgical History:  Procedure Laterality Date  . CHOLECYSTECTOMY N/A 10/11/2019   Procedure: LAPAROSCOPIC CHOLECYSTECTOMY WITH INTRAOPERATIVE CHOLANGIOGRAM;  Surgeon: Armandina Gemma, MD;  Location: [facility];  Service: General;  Laterality: N/A;  . CORONARY ANGIOGRAPHY N/A 03/23/2018   Procedure: CORONARY ANGIOGRAPHY;  Surgeon: Nelva Bush, MD;  Location: [place] [facility];  Service: Cardiovascular;  Laterality: N/A;  . HERNIA REPAIR  1/04   three  . KNEE ARTHROSCOPY Left 05/04  . PACEMAKER IMPLANT N/A 11/08/2018   Procedure: PACEMAKER IMPLANT;  Surgeon: Constance Haw, MD;  Location: [place] [facility];  Service: Cardiovascular;  Laterality: N/A;  . TOTAL KNEE ARTHROPLASTY Bilateral 05/12  . VENTRAL HERNIA REPAIR  03/2009     Current Outpatient Medications  Medication Sig Dispense Refill  . acetaminophen (TYLENOL) 500 MG tablet  Take 1,000 mg by mouth every 6 (six) hours as needed (pain).    Marland Kitchen aspirin EC 81 MG tablet Take 81 mg by mouth daily.    . isosorbide mononitrate (IMDUR) 30 MG 24 hr tablet Take 1 tablet (30 mg total) by mouth daily. 90 tablet 1  . lisinopril (ZESTRIL) 5 MG tablet Take 5 mg by mouth at bedtime.     . metoprolol succinate (TOPROL XL) 25 MG 24 hr tablet Take 1 tablet (25 mg total) by mouth daily. 30 tablet 6  . Multiple Vitamin (MULTIVITAMIN) tablet Take 1 tablet by mouth daily.    .  nitroGLYCERIN (NITROSTAT) 0.4 MG SL tablet Place 1 tablet (0.4 mg total) under the tongue every 5 (five) minutes as needed for chest pain. 25 tablet 3  . rosuvastatin (CRESTOR) 10 MG tablet TAKE 1 TABLET ONCE DAILY. 90 tablet 2  . traMADol (ULTRAM) 50 MG tablet Take 1-2 tablets (50-100 mg total) by mouth every 6 (six) hours as needed for moderate pain. 15 tablet 0   No current facility-administered medications for this visit.    Allergies:   Sertraline hcl   Social History:  The patient  reports that he has quit smoking. He has never used smokeless tobacco. He reports that he does not drink alcohol and does not use drugs.   Family History:  The patient's family history includes Aneurysm in his father; Heart attack in his father; Heart disease in his father.   ROS:  Please see the history of present illness.   Otherwise, review of systems is positive for none.   All other systems are reviewed and negative.   PHYSICAL EXAM: VS:  BP 128/70   Pulse 71   Ht 6\' 1"  (1.854 m)   Wt (!) 301 lb 12.8 oz (136.9 kg)   SpO2 91%   BMI 39.82 kg/m  , BMI Body mass index is 39.82 kg/m. GEN: Well nourished, well developed, in no acute distress  HEENT: normal  Neck: no JVD, carotid bruits, or masses Cardiac: RRR; no murmurs, rubs, or gallops,no edema  Respiratory:  clear to auscultation bilaterally, normal work of breathing GI: soft, nontender, nondistended, + BS MS: no deformity or atrophy  Skin: warm and dry, device site well healed Neuro:  Strength and sensation are intact Psych: euthymic mood, full affect  EKG:  EKG is not ordered today. Personal review of the ekg ordered 09/25/19 shows sinus rhythm, first-degree AV block, rate 62  Personal review of the device interrogation today. Results in [place]: 09/26/2019: ALT 17; Magnesium 2.1; TSH 1.420 10/03/2019: BUN 21; Creatinine, Ser 0.83; Hemoglobin 16.3; Platelets 180; Potassium 4.2; Sodium 143    Lipid Panel     Component  Value Date/Time   CHOL 128 09/26/2019 0925   TRIG 71 09/26/2019 0925   HDL 48 09/26/2019 0925   CHOLHDL 2.7 09/26/2019 0925   CHOLHDL 3.2 06/05/2010 0715   VLDL 10 06/05/2010 0715   LDLCALC 66 09/26/2019 0925     Wt Readings from Last 3 Encounters:  11/19/19 (!) 301 lb 12.8 oz (136.9 kg)  10/11/19 (!) 308 lb 5 oz (139.8 kg)  10/03/19 (!) (P) 308 lb 5 oz (139.8 kg)      Other studies Reviewed: Additional studies/ records that were reviewed today include: TTE 03/08/2018 Review of the above records today demonstrates:  Left ventricle: The cavity size was normal. There was severe   concentric hypertrophy. Systolic function was normal. The   estimated  ejection fraction was in the range of 55% to 60%. Wall   motion was normal; there were no regional wall motion   abnormalities. There was an increased relative contribution of   atrial contraction to ventricular filling. Doppler parameters are   consistent with abnormal left ventricular relaxation (grade 1   diastolic dysfunction). - Aortic valve: Moderately calcified annulus. Trileaflet; mildly   thickened, mildly calcified leaflets. There was mild stenosis.   There was trivial regurgitation. Mean gradient (S): 15 mm Hg.   Valve area (VTI): 1.53 cm^2. Valve area (Vmax): 1.48 cm^2. Valve   area (Vmean): 1.46 cm^2. - Aorta: Ascending aorta diameter: 41 mm (ED). - Ascending aorta: The ascending aorta was mildly dilated. - Mitral valve: Calcified annulus. There was mild regurgitation. - Left atrium: The atrium was mildly dilated. - Pulmonic valve: There was mild regurgitation. - Pulmonary arteries: PA peak pressure: 39 mm Hg (S).  LHC 03/23/18 1. Severe single-vessel coronary artery disease, with chronic total occlusion of the proximal to mid LAD with bridging, left-to-left, and right-to-left collaterals. 2. Moderate, non-obstructive disease involving the proximal LAD, proximal LCx, and distal RCA.  More severe disease noted involving  small branch vessels (RV marginal and superior branch of OM3). 3. Inability to cross the aortic valve for left heart catheterization due to insufficient catheter length complicated by tortuous subclavian artery and dilated ascending aorta.  LVEF noted to be normal on recent echocardiogram. 4.  ASSESSMENT AND PLAN:  1.  Intermittent complete heart block: Status post Medtronic dual-chamber pacemaker implanted 11/08/2018.  Device functioning appropriately.  No changes.   2.  Coronary artery disease: Status post CTO of the LAD.  No current chest pain.  3.  Hyperlipidemia: Goal LDL less than 70.  Continue Crestor.   Current medicines are reviewed at length with the patient today.   The patient does not have concerns regarding his medicines.  The following changes were made today:  none  Labs/ tests ordered today include:  No orders of the defined types were placed in this encounter.   Disposition:   FU with Nicey Krah 12 months  Signed, Aarion Metzgar Meredith Leeds, MD  11/19/2019 4:02 PM     [place]  [id number] [phone] (office) [phone] (fax)

## 2019-12-30 DIAGNOSIS — H2513 Age-related nuclear cataract, bilateral: Secondary | ICD-10-CM | POA: Diagnosis not present

## 2019-12-30 DIAGNOSIS — H52203 Unspecified astigmatism, bilateral: Secondary | ICD-10-CM | POA: Diagnosis not present

## 2020-02-06 ENCOUNTER — Ambulatory Visit (INDEPENDENT_AMBULATORY_CARE_PROVIDER_SITE_OTHER): Payer: PPO

## 2020-02-06 DIAGNOSIS — I442 Atrioventricular block, complete: Secondary | ICD-10-CM | POA: Diagnosis not present

## 2020-02-06 LAB — CUP PACEART REMOTE DEVICE CHECK
Battery Remaining Longevity: 164 mo
Battery Voltage: 3.06 V
Brady Statistic AP VP Percent: 1.14 %
Brady Statistic AP VS Percent: 25.3 %
Brady Statistic AS VP Percent: 1.57 %
Brady Statistic AS VS Percent: 71.99 %
Brady Statistic RA Percent Paced: 26.37 %
Brady Statistic RV Percent Paced: 2.71 %
Date Time Interrogation Session: 20210930000103
Implantable Lead Implant Date: 20200702
Implantable Lead Implant Date: 20200702
Implantable Lead Location: 753859
Implantable Lead Location: 753860
Implantable Lead Model: 5076
Implantable Lead Model: 5076
Implantable Pulse Generator Implant Date: 20200702
Lead Channel Impedance Value: 323 Ohm
Lead Channel Impedance Value: 361 Ohm
Lead Channel Impedance Value: 437 Ohm
Lead Channel Impedance Value: 456 Ohm
Lead Channel Pacing Threshold Amplitude: 0.625 V
Lead Channel Pacing Threshold Amplitude: 0.875 V
Lead Channel Pacing Threshold Pulse Width: 0.4 ms
Lead Channel Pacing Threshold Pulse Width: 0.4 ms
Lead Channel Sensing Intrinsic Amplitude: 3.625 mV
Lead Channel Sensing Intrinsic Amplitude: 3.625 mV
Lead Channel Sensing Intrinsic Amplitude: 9.25 mV
Lead Channel Sensing Intrinsic Amplitude: 9.25 mV
Lead Channel Setting Pacing Amplitude: 1.5 V
Lead Channel Setting Pacing Amplitude: 2 V
Lead Channel Setting Pacing Pulse Width: 0.4 ms
Lead Channel Setting Sensing Sensitivity: 2.8 mV

## 2020-02-10 NOTE — Progress Notes (Signed)
Remote pacemaker transmission.   

## 2020-02-14 ENCOUNTER — Other Ambulatory Visit (HOSPITAL_BASED_OUTPATIENT_CLINIC_OR_DEPARTMENT_OTHER): Payer: Self-pay | Admitting: Geriatric Medicine

## 2020-02-14 ENCOUNTER — Other Ambulatory Visit: Payer: Self-pay

## 2020-02-14 ENCOUNTER — Ambulatory Visit (HOSPITAL_BASED_OUTPATIENT_CLINIC_OR_DEPARTMENT_OTHER)
Admission: RE | Admit: 2020-02-14 | Discharge: 2020-02-14 | Disposition: A | Discharge status: Home / Self Care | Payer: PPO | Source: Ambulatory Visit | Attending: Geriatric Medicine | Admitting: Geriatric Medicine

## 2020-02-14 DIAGNOSIS — R52 Pain, unspecified: Secondary | ICD-10-CM

## 2020-02-14 DIAGNOSIS — I83891 Varicose veins of right lower extremities with other complications: Secondary | ICD-10-CM | POA: Diagnosis not present

## 2020-02-14 DIAGNOSIS — M7989 Other specified soft tissue disorders: Secondary | ICD-10-CM

## 2020-02-14 DIAGNOSIS — M79604 Pain in right leg: Secondary | ICD-10-CM | POA: Diagnosis not present

## 2020-02-14 DIAGNOSIS — M79661 Pain in right lower leg: Secondary | ICD-10-CM | POA: Diagnosis not present

## 2020-02-14 DIAGNOSIS — I10 Essential (primary) hypertension: Secondary | ICD-10-CM | POA: Diagnosis not present

## 2020-02-14 DIAGNOSIS — I808 Phlebitis and thrombophlebitis of other sites: Secondary | ICD-10-CM | POA: Diagnosis not present

## 2020-02-14 DIAGNOSIS — I8001 Phlebitis and thrombophlebitis of superficial vessels of right lower extremity: Secondary | ICD-10-CM | POA: Diagnosis not present

## 2020-03-03 ENCOUNTER — Telehealth: Payer: Self-pay | Admitting: *Deleted

## 2020-03-03 DIAGNOSIS — I712 Thoracic aortic aneurysm, without rupture, unspecified: Secondary | ICD-10-CM

## 2020-03-03 DIAGNOSIS — Z0189 Encounter for other specified special examinations: Secondary | ICD-10-CM

## 2020-03-03 DIAGNOSIS — I7121 Aneurysm of the ascending aorta, without rupture: Secondary | ICD-10-CM

## 2020-03-03 NOTE — Telephone Encounter (Signed)
-----   Message from Fonda Kinder sent at 03/03/2020  1:59 PM EDT ----- Regarding: Lab order needed Hi this patient is scheduled for an Angio Aorta on 11/2. We need a lab order placed  Thanks Lattie Haw

## 2020-03-03 NOTE — Telephone Encounter (Signed)
Order for BMET placed on this pt, per CT protocol.  Pt will have bmet arranged prior to scheduled CT Angio of Aorta on 03/10/20.

## 2020-03-04 ENCOUNTER — Other Ambulatory Visit: Payer: PPO | Admitting: *Deleted

## 2020-03-04 ENCOUNTER — Other Ambulatory Visit: Payer: Self-pay

## 2020-03-04 DIAGNOSIS — I712 Thoracic aortic aneurysm, without rupture, unspecified: Secondary | ICD-10-CM

## 2020-03-04 DIAGNOSIS — Z0189 Encounter for other specified special examinations: Secondary | ICD-10-CM

## 2020-03-04 DIAGNOSIS — I7121 Aneurysm of the ascending aorta, without rupture: Secondary | ICD-10-CM

## 2020-03-05 ENCOUNTER — Other Ambulatory Visit: Payer: Self-pay

## 2020-03-05 LAB — BASIC METABOLIC PANEL
BUN/Creatinine Ratio: 19 (ref 10–24)
BUN: 15 mg/dL (ref 8–27)
CO2: 26 mmol/L (ref 20–29)
Calcium: 9.1 mg/dL (ref 8.6–10.2)
Chloride: 105 mmol/L (ref 96–106)
Creatinine, Ser: 0.78 mg/dL (ref 0.76–1.27)
GFR calc Af Amer: 103 mL/min/{1.73_m2} (ref >59)
GFR calc non Af Amer: 89 mL/min/{1.73_m2} (ref >59)
Glucose: 90 mg/dL (ref 65–99)
Potassium: 4.4 mmol/L (ref 3.5–5.2)
Sodium: 144 mmol/L (ref 134–144)

## 2020-03-05 MED ORDER — ROSUVASTATIN CALCIUM 10 MG PO TABS: 10.0000 mg | ORAL_TABLET | Freq: Every day | ORAL | 2 refills | Status: DC

## 2020-03-05 MED ORDER — METOPROLOL SUCCINATE ER 25 MG PO TB24: 25.0000 mg | ORAL_TABLET | Freq: Every day | ORAL | 2 refills | Status: DC

## 2020-03-05 MED ORDER — ISOSORBIDE MONONITRATE ER 30 MG PO TB24: 30.0000 mg | ORAL_TABLET | Freq: Every day | ORAL | 2 refills | Status: DC

## 2020-03-05 NOTE — Telephone Encounter (Signed)
Pt's medication was sent to pt's pharmacy as requested. Confirmation received.  °

## 2020-03-10 ENCOUNTER — Other Ambulatory Visit: Payer: Self-pay

## 2020-03-10 ENCOUNTER — Ambulatory Visit (HOSPITAL_COMMUNITY): Payer: PPO | Attending: Cardiovascular Disease

## 2020-03-10 ENCOUNTER — Ambulatory Visit (INDEPENDENT_AMBULATORY_CARE_PROVIDER_SITE_OTHER)
Admission: RE | Admit: 2020-03-10 | Discharge: 2020-03-10 | Disposition: A | Discharge status: Home / Self Care | Payer: PPO | Source: Ambulatory Visit | Attending: Physician Assistant | Admitting: Physician Assistant

## 2020-03-10 DIAGNOSIS — I712 Thoracic aortic aneurysm, without rupture: Secondary | ICD-10-CM | POA: Diagnosis not present

## 2020-03-10 DIAGNOSIS — I350 Nonrheumatic aortic (valve) stenosis: Secondary | ICD-10-CM

## 2020-03-10 DIAGNOSIS — I358 Other nonrheumatic aortic valve disorders: Secondary | ICD-10-CM | POA: Diagnosis not present

## 2020-03-10 DIAGNOSIS — I700 Atherosclerosis of aorta: Secondary | ICD-10-CM | POA: Diagnosis not present

## 2020-03-10 DIAGNOSIS — I2510 Atherosclerotic heart disease of native coronary artery without angina pectoris: Secondary | ICD-10-CM | POA: Diagnosis not present

## 2020-03-10 LAB — ECHOCARDIOGRAM COMPLETE
AR max vel: 2.42 cm2
AV Area VTI: 2.62 cm2
AV Area mean vel: 2.59 cm2
AV Mean grad: 12 mmHg
AV Peak grad: 29.2 mmHg
Ao pk vel: 2.7 m/s
Area-P 1/2: 2.33 cm2
S' Lateral: 2.9 cm

## 2020-03-10 MED ORDER — PERFLUTREN LIPID MICROSPHERE
1.0000 mL | INTRAVENOUS | Status: AC | PRN
  Administered 2020-03-10: 3 mL via INTRAVENOUS

## 2020-03-10 MED ORDER — IOHEXOL 350 MG/ML SOLN
100.0000 mL | Freq: Once | INTRAVENOUS | Status: AC | PRN
  Administered 2020-03-10: 100 mL via INTRAVENOUS

## 2020-03-25 ENCOUNTER — Encounter: Payer: Self-pay | Admitting: Physician Assistant

## 2020-03-25 NOTE — Progress Notes (Addendum)
Cardiology Office Note    Date:  03/26/2020   ID:  Clarence Andrade, DOB Sep 22, 1945, MRN [id number]  PCP:  Lajean Manes, MD  Cardiologist:  Fransico Him, MD  Electrophysiologist:  Constance Haw, MD   Chief Complaint: f/u CAD  History of Present Illness:   Clarence Andrade is a 74 y.o. male with history of CAD by cath 03/2018 as below, ascending aortic aneurysm, aortic stenosis (mild by 03/2020 echo), CHB s/p Medtronic PPM 11/2018, pericarditis, prior hyperglycemia, hyponatremia, dyslipidemia, pulmonary nodule, adrenal incidentaloma, mild pulmonary HTN (normal by echo 03/2019), brief NSVT on device, essential HTN, morbid obesity, & ventral hernia who presents for routine follow-up.   He had remote diagnosis of pericarditis and also CAD. In 2019 he developed chest pain that was potentially felt due to recurrent pericarditis, but also underwent concomitant workup for coronary disease. He underwent nuclear stress test in 03/2018 that was abnormal prompting cath which showed CTO of the prox-mid LAD with bridging, L-L and R-L collaterals, moderate non-obstructive disease involving the proximal LAD, proximal LCx, and distal RCA, more severe disease noted involving small branch vessels (RV marginal and superior branch of OM3). Cath notable for inability to cross the aortic valve for left heart catheterization due to insufficient catheter length complicated by tortuous subclavian artery and dilated ascending aorta. His antianginal therapy was titrated with recommendation to reserve CTO PCI for refractory angina. Cardiac MRI 04/09/18 done alongside workup showed findings below with no evidence for pericardial inflammation or pericardial effusion. He has history of fatigue but has declined evaluation for OSA.   In 2020 he developed near-syncope and was found to have intermitted complete heart block on outpatient monitor with continued bradycardia despite BB cessation, ultimately requiring PPM 11/2018.  His device interrogation previously revealed very short runs of NSVT so Dr. Curt Bears increased metoprolol. Last echo 03/10/20 EF 55-60%, grade 1 DD, moderate LAE, mild aortic stenosis, moderate dilation of ascending aorta. Routine f/u CTA 03/2020 showed unchanged appearance/size of ascending aorta measuring 45mm and unchanged pulmonary nodule felt benign.  He returns for follow-up today overall doing well.  He does not complain of angina, dyspnea, palpitations, syncope, or dizziness.  He is tolerating his medication regimen well.  He is participating in aqua aerobics 5 days a week with his wife and really enjoys it.  He recently saw his primary care for what appears to be a thrombosed vein on his right anterior shin that he has had for 20 years.  He had apparently had some tracking erythema and a duplex showed superficial thrombophlebitis (not DVT) so his primary care increased his aspirin to twice daily for now.  The symptoms have improved.  Labwork independently reviewed: 03/04/20 BMET wnl K 4.4, Cr 0.78 09/2019 CBC wnl, LDL 66, TSH wnl   Past Medical History:  Diagnosis Date   Abdominal hernia    Midline   Adrenal incidentaloma ([place]) 2010   Aortic stenosis    Ascending aortic aneurysm ([place])    a. 4.4cm by imaging 03/2020.   CAD (coronary artery disease)    a. Cath 03/2018 - CTO of the prox-mid LAD with bridging, L-L and R-L collaterals, moderate, non-obstructive disease involving the proximal LAD, proximal LCx, and distal RCA, more severe disease noted involving small branch vessels (RV marginal and superior branch of OM3).   Complete heart block (HCC)    Gallstones    Heart murmur    History of pacemaker    a. 11/2018 - Medtronic, intermittent CHB.  Hyperlipidemia    Morbid obesity (HCC)    Myocardial infarction [facility])    NSVT (nonsustained ventricular tachycardia) (HCC)    OSA (obstructive sleep apnea)    Severe PSG 1/07 AHI 66/hr, O2 Nad[place] 50% Refuses treatment - Pt does  not believe test was accurate   Pericarditis    Pneumonia    Pulmonary HTN (HCC)    mild with PASP 2mmHg by echo 02/2018, not seen on 03/2020 echo   Pulmonary nodule    Screening for AAA (abdominal aortic aneurysm) 2010   CT Abd/Pelvis   Ventral hernia    03/2009    Past Surgical History:  Procedure Laterality Date   CHOLECYSTECTOMY N/A 10/11/2019   Procedure: LAPAROSCOPIC CHOLECYSTECTOMY WITH INTRAOPERATIVE CHOLANGIOGRAM;  Surgeon: Armandina Gemma, MD;  Location: [facility];  Service: General;  Laterality: N/A;   CORONARY ANGIOGRAPHY N/A 03/23/2018   Procedure: CORONARY ANGIOGRAPHY;  Surgeon: Nelva Bush, MD;  Location: [place] CV LAB;  Service: Cardiovascular;  Laterality: N/A;   HERNIA REPAIR  1/04   three   KNEE ARTHROSCOPY Left 05/04   PACEMAKER IMPLANT N/A 11/08/2018   Procedure: PACEMAKER IMPLANT;  Surgeon: Constance Haw, MD;  Location: [place] CV LAB;  Service: Cardiovascular;  Laterality: N/A;   TOTAL KNEE ARTHROPLASTY Bilateral 05/12   VENTRAL HERNIA REPAIR  03/2009    Current Medications: Current Meds  Medication Sig   acetaminophen (TYLENOL) 500 MG tablet Take 1,000 mg by mouth every 6 (six) hours as needed (pain).   aspirin EC 81 MG tablet Take 81 mg by mouth in the morning and at bedtime.    isosorbide mononitrate (IMDUR) 30 MG 24 hr tablet Take 1 tablet (30 mg total) by mouth daily.   lisinopril (ZESTRIL) 5 MG tablet Take 5 mg by mouth at bedtime.    metoprolol succinate (TOPROL XL) 25 MG 24 hr tablet Take 1 tablet (25 mg total) by mouth daily.   Multiple Vitamin (MULTIVITAMIN) tablet Take 1 tablet by mouth daily.   nitroGLYCERIN (NITROSTAT) 0.4 MG SL tablet Place 1 tablet (0.4 mg total) under the tongue every 5 (five) minutes as needed for chest pain.   rosuvastatin (CRESTOR) 10 MG tablet Take 1 tablet (10 mg total) by mouth daily.      Allergies:   Sertraline hcl   Social History   Socioeconomic History   Marital status:  Married    Spouse name: Not on file   Number of children: Not on file   Years of education: Not on file   Highest education level: Not on file  Occupational History   Not on file  Tobacco Use   Smoking status: Former Smoker   Smokeless tobacco: Never Used  Scientific laboratory technician Use: Never used  Substance and Sexual Activity   Alcohol use: No   Drug use: No   Sexual activity: Not on file  Other Topics Concern   Not on file  Social History Narrative   Not on file   Social Determinants of Health   Financial Resource Strain:    Difficulty of Paying Living Expenses: Not on file  Food Insecurity:    Worried About Charity fundraiser in the Last Year: Not on file   [place] in the Last Year: Not on file  Transportation Needs:    Lack of Transportation (Medical): Not on file   Lack of Transportation (Non-Medical): Not on file  Physical Activity:    Days of Exercise per Week: Not  on file   Minutes of Exercise per Session: Not on file  Stress:    Feeling of Stress : Not on file  Social Connections:    Frequency of Communication with Friends and Family: Not on file   Frequency of Social Gatherings with Friends and Family: Not on file   Attends Religious Services: Not on file   Active Member of Clubs or Organizations: Not on file   Attends Archivist Meetings: Not on file   Marital Status: Not on file     Family History:  The patient's family history includes Aneurysm in his father; Heart attack in his father; Heart disease in his father.  ROS:   Please see the history of present illness.  All other systems are reviewed and otherwise negative.    EKGs/Labs/Other Studies Reviewed:    Studies reviewed are outlined and summarized above. Reports included below if pertinent.  CT Angio 03/10/20 FINDINGS: Cardiovascular:  Heart:  Heart size unchanged. No pericardial fluid/thickening. Left chest wall cardiac pacing device with 2  leads terminating in the right atrium in the apex of the right ventricle. Calcifications of the left anterior descending, circumflex, right coronary arteries.  Aorta:  Annulus estimated 32 mm on the coronal images. Mild calcifications of the aortic valve. Estimated diameter of the sino-tubular junction 30 mm. Greatest diameter of ascending aorta is estimated 44 mm. My measurement on the prior CT estimated 44 mm.  Atherosclerotic changes of the aortic arch and the proximal branch vessels. Branch vessels are patent with tortuous course.  Mild atherosclerosis of the descending thoracic aorta. Distal thoracic aorta measures 2.6 cm above the hiatus. No periaortic fluid. No dissection.  Pulmonary arteries:  No central, lobar, segmental, or proximal subsegmental filling defects.  Mediastinum/Nodes: No mediastinal adenopathy. Unremarkable appearance of the thoracic esophagus.  Unremarkable thoracic inlet.  Lungs/Pleura: Central airways are clear. No pleural effusion. No confluent airspace disease.  No pneumothorax.  Unchanged nodule in the lateral left lower lobe, sub-pleural location. Estimated 5mm.  Upper Abdomen: No acute.  Musculoskeletal: No acute displaced fracture. Degenerative changes of the spine.  Review of the MIP images confirms the above findings.  IMPRESSION: Relatively unchanged appearance and size of the ascending aorta, maximum diameter estimated 44 mm currently. Recommend annual imaging followup by CTA or MRA. This recommendation follows 2010 ACCF/AHA/AATS/ACR/ASA/SCA/SCAI/SIR/STS/SVM Guidelines for the Diagnosis and Management of Patients with Thoracic Aortic Disease. Circulation. 2010; 121: N470-J628. Aortic aneurysm NOS (ICD10-I71.9)  Aortic Atherosclerosis (ICD10-I70.0). Associated coronary artery disease.  Unchanged small nodule at the left lung base, likely benign.  Signed,  Dulcy Fanny. Dellia Nims, RPVI  [facility]  [facility]   Electronically Signed   By: Corrie Mckusick D.O.   On: 03/11/2020 16:34  2D echo 03/2020 1. Left ventricular ejection fraction, by estimation, is 55 to 60%. The  left ventricle has normal function. The left ventricle has no regional  wall motion abnormalities. There is mild concentric left ventricular  hypertrophy. Left ventricular diastolic  parameters are consistent with Grade I diastolic dysfunction (impaired  relaxation).  2. Right ventricular systolic function is normal. The right ventricular  size is normal. There is normal pulmonary artery systolic pressure.  3. Left atrial size was moderately dilated.  4. The mitral valve is normal in structure. Trivial mitral valve  regurgitation. No evidence of mitral stenosis.  5. The aortic valve is normal in structure. There is moderate  calcification of the aortic valve. There is moderate thickening of  the  aortic valve. Aortic valve regurgitation is not visualized. Mild aortic  valve stenosis. Aortic valve area, by VTI  measures 2.62 cm. Aortic valve mean gradient measures 12.0 mmHg. Aortic  valve Vmax measures 2.70 m/s.  6. Aortic dilatation noted. There is moderate dilatation of the ascending  aorta, measuring 45 mm. There is mild dilatation at the level of the  sinuses of Valsalva, measuring 42 mm.  7. The inferior vena cava is normal in size with greater than 50%  respiratory variability, suggesting right atrial pressure of 3 mmHg.   LHC 03/2018 Conclusions: 1. Severe single-vessel coronary artery disease, with chronic total occlusion of the proximal to mid LAD with bridging, left-to-left, and right-to-left collaterals. 2. Moderate, non-obstructive disease involving the proximal LAD, proximal LCx, and distal RCA. More severe disease noted involving small branch vessels (RV marginal and superior branch of OM3). 3. Inability to cross the aortic valve for left  heart catheterization due to insufficient catheter length complicated by tortuous subclavian artery and dilated ascending aorta. LVEF noted to be normal on recent echocardiogram.  Recommendations: 1. Medical therapy and secondary prevention. I will start isosorbide mononitrate 30 mg daily. 2. If Mr. Palma continues to have symptoms despite optimal medical therapy (max tolerated doses of at least 2 antianginal agents), PCI to CTO of the LAD should be considered. 3. Follow-up with Dr. Radford Pax or APP in ~2 weeks.  Recommend aspirin 81mg  daily for moderate/severe CAD.  Nelva Bush, MD [facility] Pager: [phone]  CMRI 2019 FINDINGS: Limited images of the lung fields show no gross abnormalities.  The ascending aorta is mildly dilated at 4.0 cm.  No pericardial effusion noted, no pericardial thickening noted.  Normal left ventricular size with mild LV hypertrophy. Mild anteroseptal hypokinesis with EF 56%. The right ventricle is mildly dilated, normal systolic function with EF 66%. Mild right and left atrial enlargement. Thickened aortic valve with trivial regurgitation. Mild mitral regurgitation.  On delayed enhancement imaging, there was < 50% wall thickness subendocardial late gadolinium enhancement (LGE) in the mid to apical anteroseptal wall.  Measurements:  LVEDV 160 mL  LVSV 90 mL  LV EF 56%  RVEDV 140 mL  RVSV 93 mL  RV EF 66%  IMPRESSION: 1. Normal LV size with mild LV hypertrophy. EF 56% with mild mid to apical anteroseptal hypokinesis.  2.  Mildly dilated RV with normal systolic function, EF 14%.  3. <50% wall thickness subendocardial LGE in the mid-apical anteroseptal wall, consistent with prior infarction.  4. No evidence for pericardial inflammation or pericardial effusion.  Dalton Mclean   Electronically Signed   By: Loralie Champagne M.D.   On: 04/09/2018 22:52    EKG:  EKG is not ordered today but reviewed  from 09/2019.  Recent Labs: 09/26/2019: ALT 17; Magnesium 2.1; TSH 1.420 10/03/2019: Hemoglobin 16.3; Platelets 180 03/04/2020: BUN 15; Creatinine, Ser 0.78; Potassium 4.4; Sodium 144  Recent Lipid Panel    Component Value Date/Time   CHOL 128 09/26/2019 0925   TRIG 71 09/26/2019 0925   HDL 48 09/26/2019 0925   CHOLHDL 2.7 09/26/2019 0925   CHOLHDL 3.2 06/05/2010 0715   VLDL 10 06/05/2010 0715   LDLCALC 66 09/26/2019 0925    PHYSICAL EXAM:    VS:  BP 124/70    Pulse 71    Ht 6\' 1"  (1.854 m)    Wt (!) 304 lb (137.9 kg)    SpO2 96%    BMI 40.11 kg/m   BMI: Body mass index  is 40.11 kg/m.  GEN: Well nourished, well developed obese WM, in no acute distress HEENT: normocephalic, atraumatic Neck: no JVD, carotid bruits, or masses Cardiac: RRR, soft SEM RUSB, no rubs, or gallops, no edema  Respiratory:  clear to auscultation bilaterally, normal work of breathing GI: soft, nontender, nondistended, + BS MS: no deformity or atrophy Skin: warm and dry, R lower leg nodule, no unusual erythema Neuro:  Alert and Oriented x 3, Strength and sensation are intact, follows commands Psych: euthymic mood, full affect  Wt Readings from Last 3 Encounters:  03/26/20 (!) 304 lb (137.9 kg)  11/19/19 (!) 301 lb 12.8 oz (136.9 kg)  10/11/19 (!) 308 lb 5 oz (139.8 kg)     ASSESSMENT & PLAN:   1. CAD: Doing well without angina.  Continue aspirin, isosorbide, metoprolol, and Crestor.  No changes made today.  Encouraged continued physical activity. 2. Mild aortic stenosis: Historically over the last few years this has been mild, then study last year was felt to be moderate.  His repeat study earlier this month showed that it was mild.  We discussed that our next echo could potentially be in about 18 months.  We can schedule this closer to that timeframe at his next follow-up. 3. NSVT: Quiescent, no symptoms at this time.  His pacemaker is followed by Dr. Curt Bears. 4. Dyslipidemia: Most recent lipids in May  2021 were at goal.  Continue statin.  He is on his current dose of Crestor due to myalgias at a higher dose. 5. Ascending aortic aneurysm: Previously discussed this diagnosis and precautions in depth with patient at multiple prior visits.  Last CT scan earlier this month showed stability in aneurysm size.  Continue beta-blocker. Blood pressure is controlled.  Anticipate repeating scan in November 2022.  He would prefer that we potentially push this out to 18 months instead.  As a compromise, we will revisit timing at his 1 year follow-up just in case he happens to have any incidental imaging between now and then to provide a more updated assessment.  Disposition: F/u with myself or Dr. Radford Pax in 1 year. (Previously followed every 6 months but patient prefers 1 year follow-up instead - he will alert [place] if any issues arise between now and next appointment. He is also followed by Dr. Curt Bears for his PPM and Dr. Felipa Eth with primary care.)  Medication Adjustments/Labs and Tests Ordered: Current medicines are reviewed at length with the patient today.  Concerns regarding medicines are outlined above. Medication changes, Labs and Tests ordered today are summarized above and listed in the Patient Instructions accessible in Encounters.   Signed, Charlie Pitter, PA-C  03/26/2020 1:45 PM    [facility] [address], [place], [place]  [address] Phone: [phone]; Fax: [phone]

## 2020-03-26 ENCOUNTER — Encounter: Payer: Self-pay | Admitting: Physician Assistant

## 2020-03-26 ENCOUNTER — Other Ambulatory Visit: Payer: Self-pay

## 2020-03-26 ENCOUNTER — Ambulatory Visit: Payer: PPO | Admitting: Physician Assistant

## 2020-03-26 VITALS — BP 124/70 | HR 71 | Ht 73.0 in | Wt 304.0 lb

## 2020-03-26 DIAGNOSIS — I712 Thoracic aortic aneurysm, without rupture: Secondary | ICD-10-CM

## 2020-03-26 DIAGNOSIS — E785 Hyperlipidemia, unspecified: Secondary | ICD-10-CM

## 2020-03-26 DIAGNOSIS — I472 Ventricular tachycardia: Secondary | ICD-10-CM | POA: Diagnosis not present

## 2020-03-26 DIAGNOSIS — I350 Nonrheumatic aortic (valve) stenosis: Secondary | ICD-10-CM | POA: Diagnosis not present

## 2020-03-26 DIAGNOSIS — I7121 Aneurysm of the ascending aorta, without rupture: Secondary | ICD-10-CM

## 2020-03-26 DIAGNOSIS — I2510 Atherosclerotic heart disease of native coronary artery without angina pectoris: Secondary | ICD-10-CM

## 2020-03-26 DIAGNOSIS — I4729 Other ventricular tachycardia: Secondary | ICD-10-CM

## 2020-03-26 NOTE — Patient Instructions (Signed)
Medication Instructions:  Your physician recommends that you continue on your current medications as directed. Please refer to the Current Medication list given to you today.  *If you need a refill on your cardiac medications before your next appointment, please call your pharmacy*   Lab Work: None If you have labs (blood work) drawn today and your tests are completely normal, you will receive your results only by:  [place] (if you have MyChart) OR  A paper copy in the mail If you have any lab test that is abnormal or we need to change your treatment, we will call you to review the results.   Testing/Procedures: None   Follow-Up: At [facility], you and your health needs are our priority.  As part of our continuing mission to provide you with exceptional heart care, we have created designated Provider Care Teams.  These Care Teams include your primary Cardiologist (physician) and Advanced Practice Providers (APPs -  Physician Assistants and Nurse Practitioners) who all work together to provide you with the care you need, when you need it.  We recommend signing up for the patient portal called "MyChart".  Sign up information is provided on this After Visit Summary.  MyChart is used to connect with patients for Virtual Visits (Telemedicine).  Patients are able to view lab/test results, encounter notes, upcoming appointments, etc.  Non-urgent messages can be sent to your provider as well.   To learn more about what you can do with MyChart, go to [email or web address].    Your next appointment:   1 year(s)  The format for your next appointment:   In Person  Provider:   Melina Copa, PA-C   Other Instructions Your physician wants you to follow-up in: 1 year with Dr. Radford Pax or Melina Copa, PA.  You will receive a reminder letter in the mail two months in advance. If you don't receive a letter, please call our office to schedule the follow-up appointment.

## 2020-04-09 DIAGNOSIS — Z1389 Encounter for screening for other disorder: Secondary | ICD-10-CM | POA: Diagnosis not present

## 2020-04-09 DIAGNOSIS — E7800 Pure hypercholesterolemia, unspecified: Secondary | ICD-10-CM | POA: Diagnosis not present

## 2020-04-09 DIAGNOSIS — I714 Abdominal aortic aneurysm, without rupture: Secondary | ICD-10-CM | POA: Diagnosis not present

## 2020-04-09 DIAGNOSIS — Z0000 Encounter for general adult medical examination without abnormal findings: Secondary | ICD-10-CM | POA: Diagnosis not present

## 2020-04-09 DIAGNOSIS — I10 Essential (primary) hypertension: Secondary | ICD-10-CM | POA: Diagnosis not present

## 2020-04-09 DIAGNOSIS — I350 Nonrheumatic aortic (valve) stenosis: Secondary | ICD-10-CM | POA: Diagnosis not present

## 2020-04-09 DIAGNOSIS — I700 Atherosclerosis of aorta: Secondary | ICD-10-CM | POA: Diagnosis not present

## 2020-04-09 DIAGNOSIS — Z79899 Other long term (current) drug therapy: Secondary | ICD-10-CM | POA: Diagnosis not present

## 2020-05-06 LAB — CUP PACEART REMOTE DEVICE CHECK
Battery Remaining Longevity: 160 mo
Battery Voltage: 3.05 V
Brady Statistic AP VP Percent: 0.62 %
Brady Statistic AP VS Percent: 23.17 %
Brady Statistic AS VP Percent: 1.29 %
Brady Statistic AS VS Percent: 74.92 %
Brady Statistic RA Percent Paced: 23.77 %
Brady Statistic RV Percent Paced: 1.91 %
Date Time Interrogation Session: 20211229225340
Implantable Lead Implant Date: 20200702
Implantable Lead Implant Date: 20200702
Implantable Lead Location: 753859
Implantable Lead Location: 753860
Implantable Lead Model: 5076
Implantable Lead Model: 5076
Implantable Pulse Generator Implant Date: 20200702
Lead Channel Impedance Value: 323 Ohm
Lead Channel Impedance Value: 342 Ohm
Lead Channel Impedance Value: 437 Ohm
Lead Channel Impedance Value: 437 Ohm
Lead Channel Pacing Threshold Amplitude: 0.625 V
Lead Channel Pacing Threshold Amplitude: 0.75 V
Lead Channel Pacing Threshold Pulse Width: 0.4 ms
Lead Channel Pacing Threshold Pulse Width: 0.4 ms
Lead Channel Sensing Intrinsic Amplitude: 3 mV
Lead Channel Sensing Intrinsic Amplitude: 3 mV
Lead Channel Sensing Intrinsic Amplitude: 6.75 mV
Lead Channel Sensing Intrinsic Amplitude: 6.75 mV
Lead Channel Setting Pacing Amplitude: 1.5 V
Lead Channel Setting Pacing Amplitude: 2 V
Lead Channel Setting Pacing Pulse Width: 0.4 ms
Lead Channel Setting Sensing Sensitivity: 2.8 mV

## 2020-05-07 ENCOUNTER — Ambulatory Visit (INDEPENDENT_AMBULATORY_CARE_PROVIDER_SITE_OTHER): Payer: PPO

## 2020-05-07 DIAGNOSIS — I442 Atrioventricular block, complete: Secondary | ICD-10-CM | POA: Diagnosis not present

## 2020-05-15 ENCOUNTER — Telehealth: Payer: Self-pay | Admitting: *Deleted

## 2020-05-15 NOTE — Telephone Encounter (Signed)
-----   Message from Will Meredith Leeds, MD sent at 05/07/2020 10:17 AM EST ----- Abnormal device interrogation reviewed.  Lead parameters and battery status stable.  NSVT noted. Increase toprol xl to 50 mg.

## 2020-05-15 NOTE — Telephone Encounter (Signed)
Advised of transmission findings and MD recommendation. Pt will take two 25 mg tablets at a time and see how he responds.   Will follow up within next couple weeks (pt will send Mychart message or I will call) to see if responding ok to increased dosing prior to sending in new Rx. Pt agreeable to plan.

## 2020-05-18 NOTE — Telephone Encounter (Signed)
Spoke with pt in response to [employer] requesting additional information regarding report.  Discussed NSVT episodes in detail.  Confirmed that they could not have been from an outside influence such as EMI.  Pt. Confirms he has increased Metoprolol dosage and will f/u in 2 weeks with how he is feeling.

## 2020-05-22 NOTE — Progress Notes (Signed)
Remote pacemaker transmission.   

## 2020-05-23 IMAGING — US US ABDOMEN COMPLETE
1 series · 13 of 25 positions shown · non-contrast
Comparison: Chest CT 03/11/2019.  Abdominal CT report 01/28/2009.

CLINICAL DATA: Right upper quadrant pain.

EXAM:
ABDOMEN ULTRASOUND COMPLETE

[Series 1: us abdomen complete · 0.28mm/px · 13 of 101 slices shown]
[im 1/101]
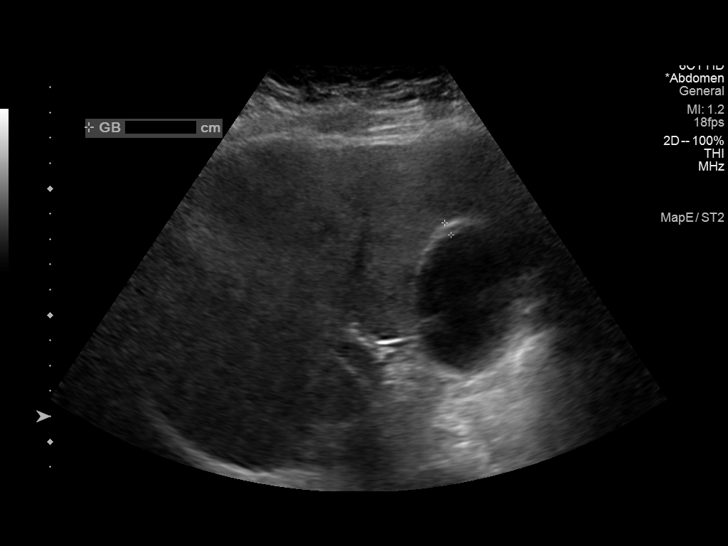
[im 9/101]
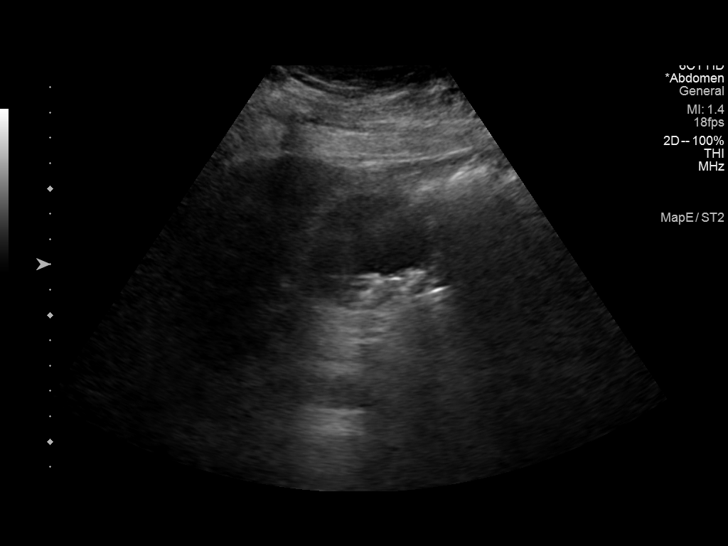
[im 17/101]
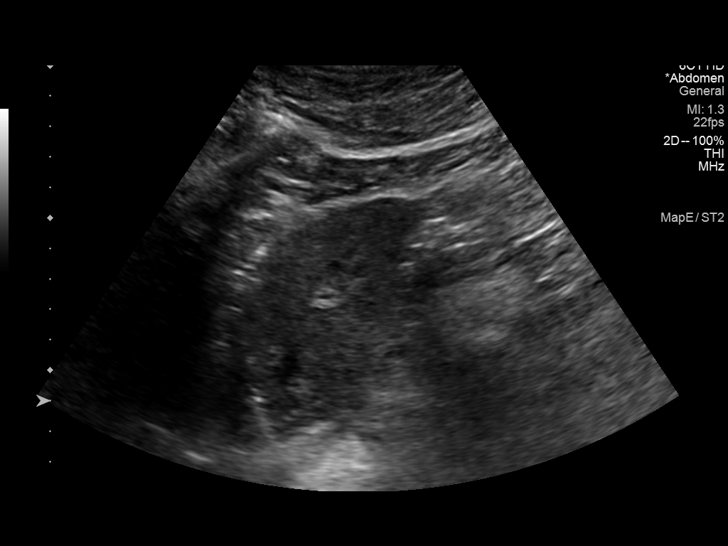
[im 26/101]
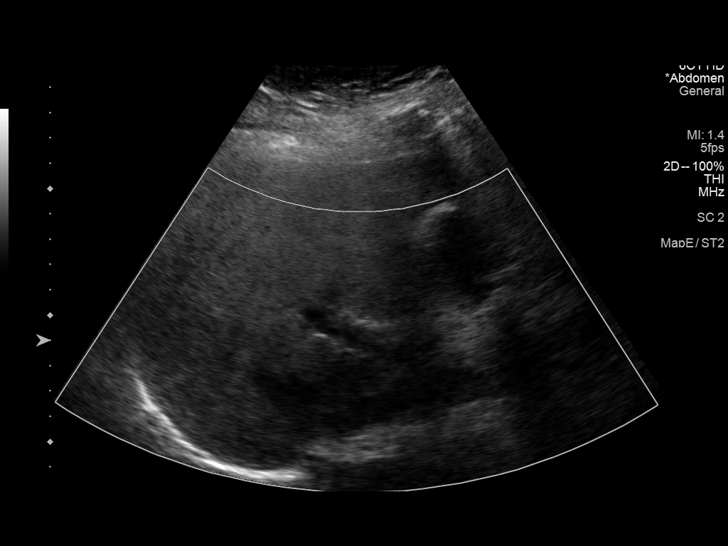
[im 34/101]
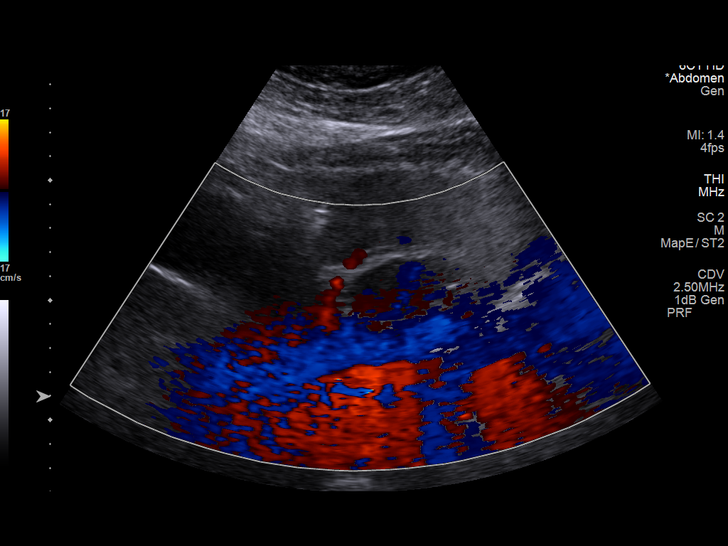
[im 42/101]
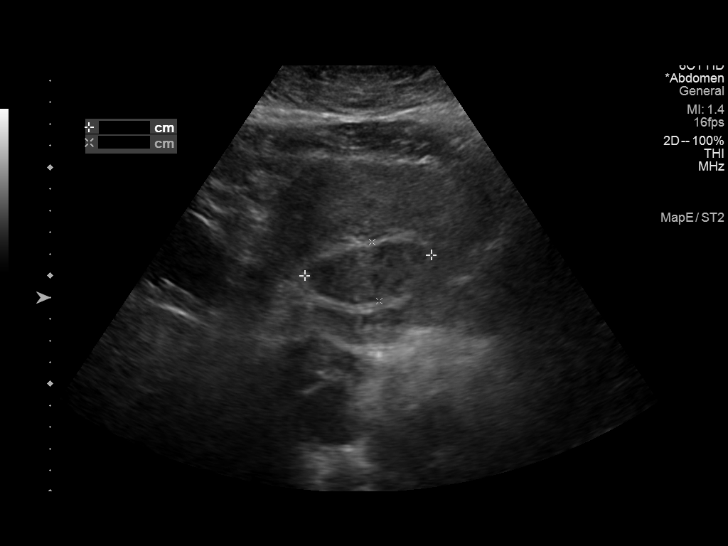
[im 51/101]
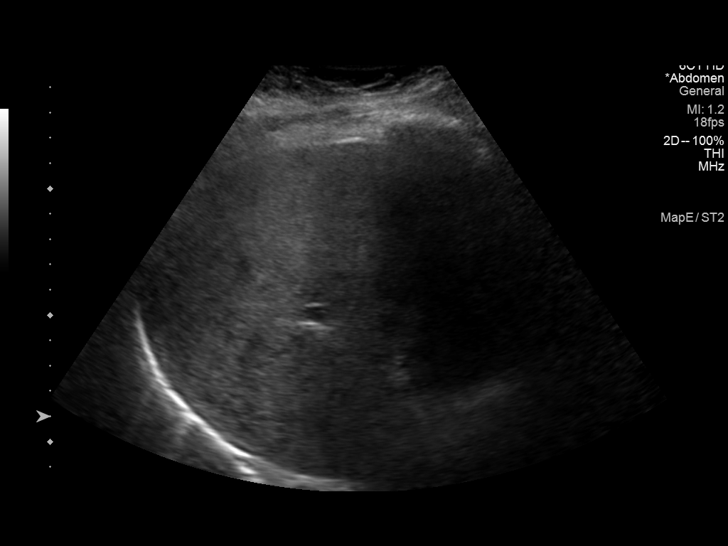
[im 59/101]
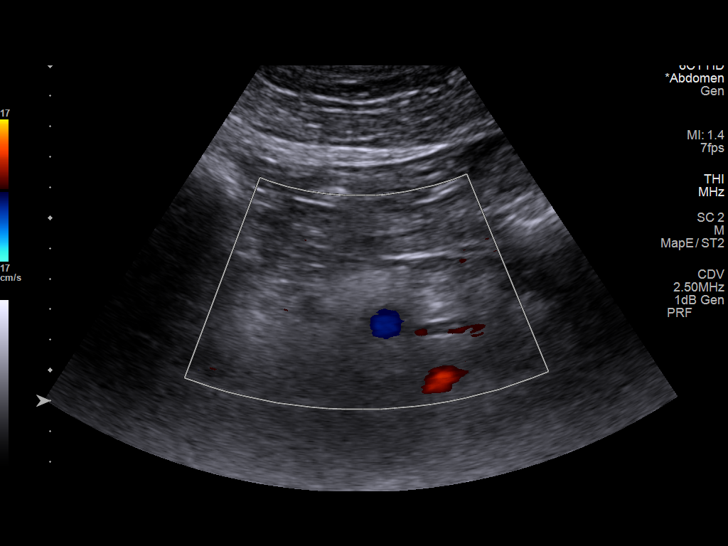
[im 67/101]
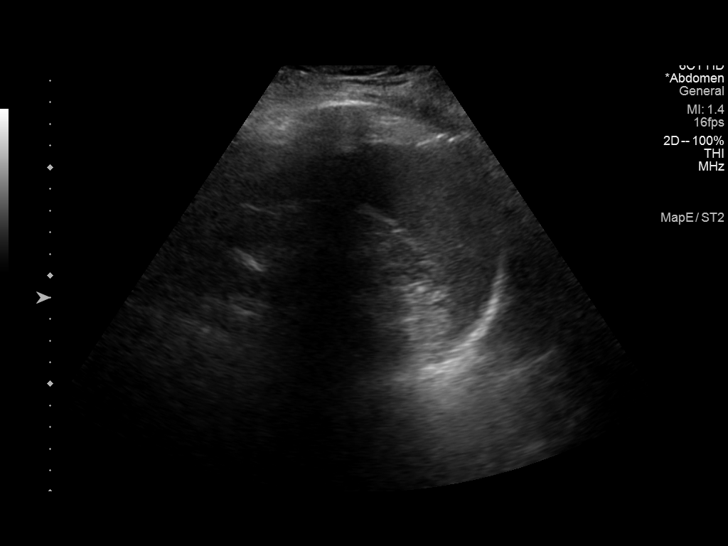
[im 76/101]
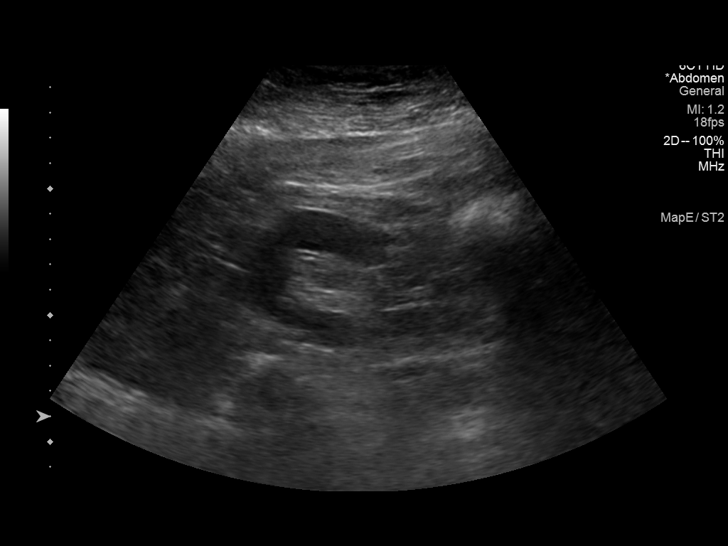
[im 84/101]
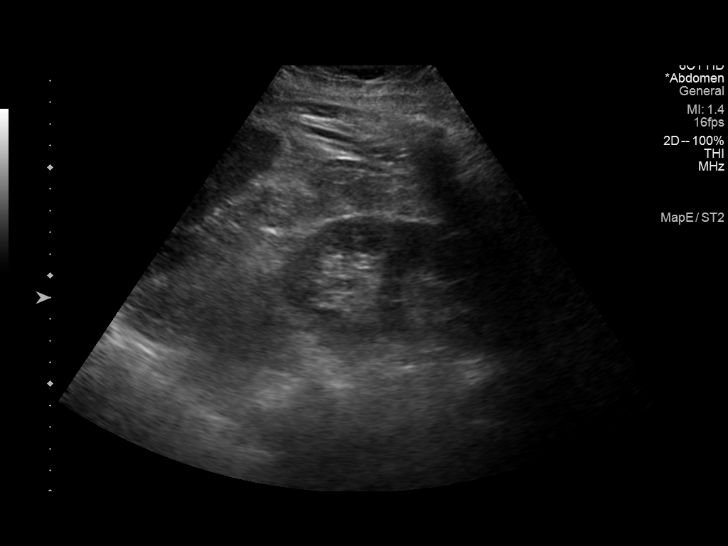
[im 92/101]
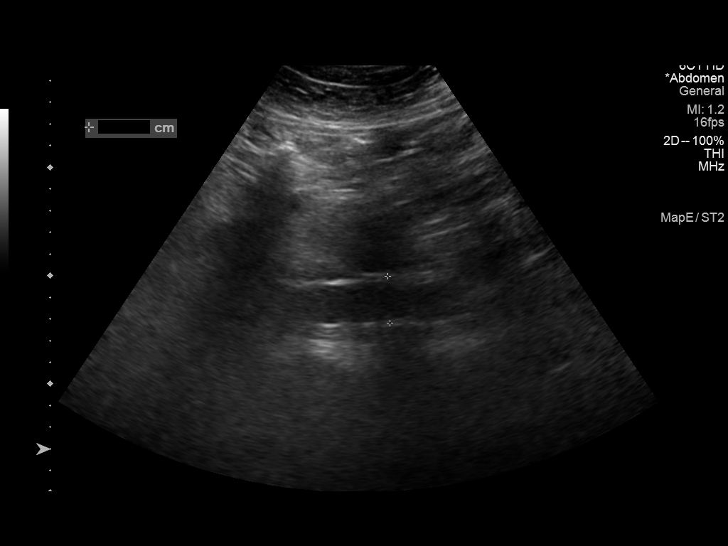
[im 101/101]
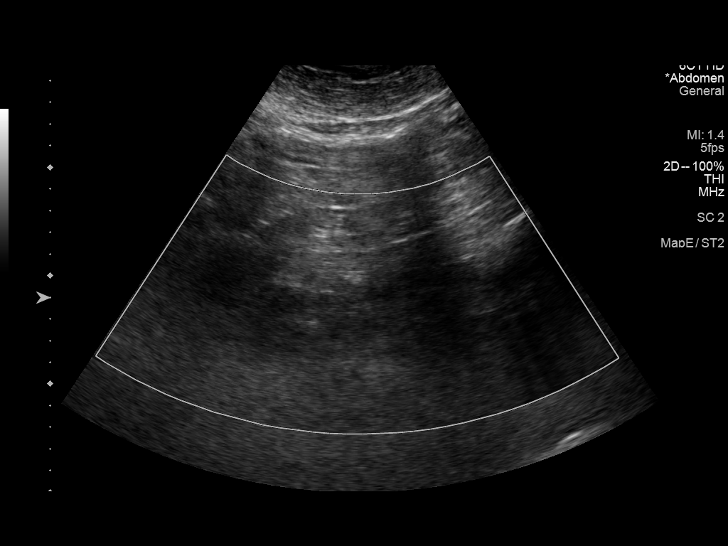

[13 of 25 positions shown; findings below may reference images not displayed]

FINDINGS: Gallbladder: Gallstones measuring up to 1.5 cm noted. Gallbladder
wall is thickened at 5 mm. Negative Murphy sign.

Common bile duct: Diameter: 5.6 mm

Liver: A 5.9 x 2.7 x 3.4 solid hypoechoic mass is noted in the
central portion of the liver. Further evaluation with MRI suggested.
Portal vein is patent on color Doppler imaging with normal direction
of blood flow towards the liver.

IVC: No abnormality visualized.

Pancreas: Visualized portion unremarkable.

Spleen: Size and appearance within normal limits.

Right Kidney: Length: 14.0 cm. Echogenicity within normal limits. No
mass or hydronephrosis visualized.

Left Kidney: Length: 14.0 cm. Echogenicity within normal limits. No
mass or hydronephrosis visualized.

Abdominal aorta: No aneurysm visualized.

Other findings: Exam limited by patient's body habitus.
IMPRESSION: 1. Gallstones measuring up to 1.5 cm noted. Gallbladder wall is
thickened at 5 mm. Cholecystitis cannot be excluded. Negative Murphy
sign. No biliary distention.

2. A 5.9 x 2.7 x 3.4 cm solid hypoechoic mass is noted the central
portion of the liver.

## 2020-05-29 ENCOUNTER — Telehealth: Payer: Self-pay

## 2020-05-29 NOTE — Telephone Encounter (Signed)
Called pt. He reports that since increasing Metoprolol:  Having almost constant HAs, wakes up every morning w/ one and occurs several times through out the day.  Energy level has gone down to nothing and he cannot continue like this   Pt is going to go back to original dose of Metoprolol (25 mg BID) until he hears back from office. Aware I will follow up next week after reviewed by Dr. Curt Bears who is not in the office today.

## 2020-05-29 NOTE — Telephone Encounter (Signed)
Patient called in c/o issues with the isosorbide. He states it was doubled and he feels terrible since he has been taking it and he wants to know if he can go back to the 25mg  ? Please call patient back

## 2020-06-01 NOTE — Telephone Encounter (Signed)
Correction -- pt is taking Toprol 25 mg ONCE daily. Will forward to [facility] for [place] and if different advisement.  Since reducing back to 25 mg -- at this time pt reports HA slight yesterday and none today.  Energy is much improved.

## 2020-06-01 NOTE — Telephone Encounter (Signed)
Continue current dose.

## 2020-06-13 IMAGING — MR MR ABDOMEN WO/W CM MRCP
19 of 21 series · 46 of 48 positions shown · IV contrast (gadavist)
Comparison: Ultrasound exam 08/13/2019

CLINICAL DATA: Gallstones on recent ultrasound. 6 cm hypoechoic
liver lesion also identified on that previous study.

EXAM:
MRI ABDOMEN WITHOUT AND WITH CONTRAST (INCLUDING MRCP)
TECHNIQUE: Multiplanar multisequence MR imaging of the abdomen was performed
both before and after the administration of intravenous contrast.
Heavily T2-weighted images of the biliary and pancreatic ducts were
obtained, and three-dimensional MRCP images were rendered by post
processing.
CONTRAST:  10mL GADAVIST GADOBUTROL 1 MMOL/ML IV SOLN

[Series 4: ax haste · axial · 6.0mm · 1.31mm/px · 1 of 40 slices shown]
[im 1/40]
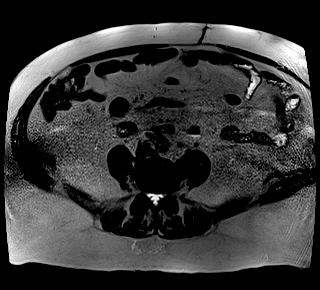

[Series 5: cor haste · coronal · 6.0mm · 1.31mm/px · 1 of 40 slices shown]
[im 1/40]
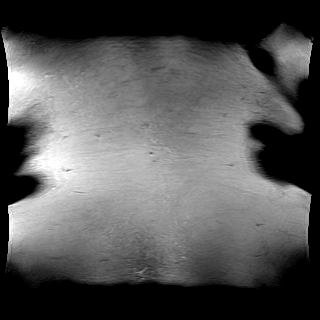

[Series 8: T2 fat-sat · axial · 6.0mm · 1.31mm/px · 1 of 40 slices shown]
[im 1/40]
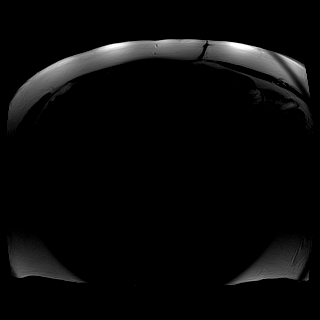

[Series 13: DWI · axial · 6.0mm · 1.57mm/px · z∈[-33,+248]mm · 4 of 120 slices shown (1 of 2)]
[im 1/120]
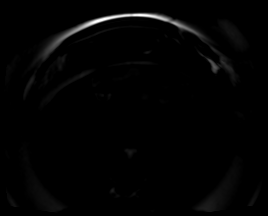
[im 40/120]
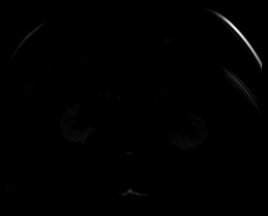
[im 80/120]
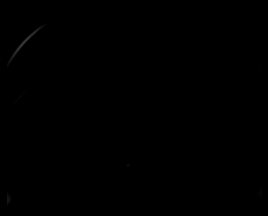
[im 120/120]
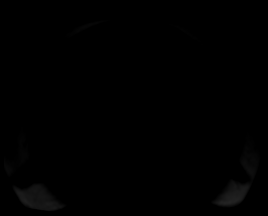

[Series 14: DWI · axial · 6.0mm · 1.57mm/px · 1 of 40 slices shown (2 of 2)]
[im 1/40]
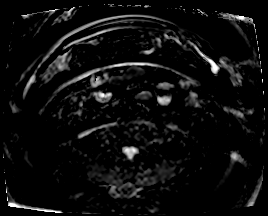

[Series 15: ax in and · axial · 3.0mm · 1.38mm/px · z∈[-35,+250]mm · 3 of 96 slices shown (1 of 2)]
[im 1/96]
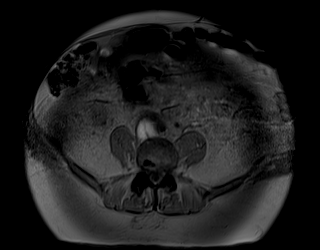
[im 48/96]
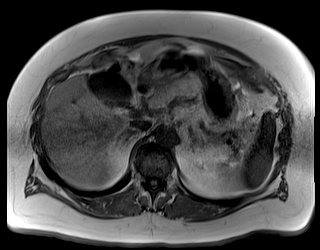
[im 96/96]
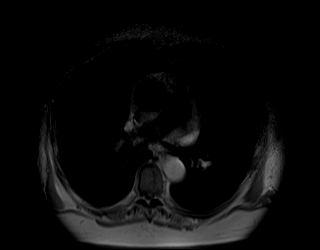

[Series 15: ax in and · axial · 3.0mm · 1.38mm/px · z∈[-35,+250]mm · 3 of 96 slices shown (2 of 2)]
[im 1/96]
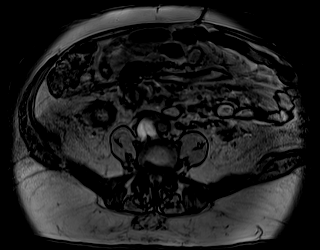
[im 48/96]
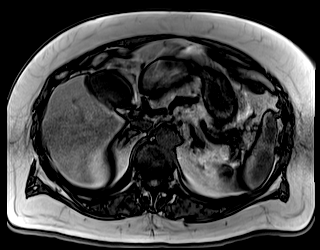
[im 96/96]
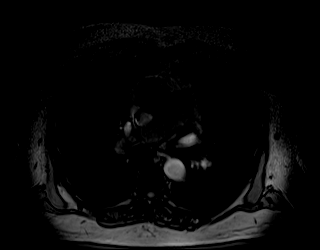

[Series 16: MRCP · coronal · 4.0mm · 1.12mm/px · 1 of 15 slices shown]
[im 1/15]
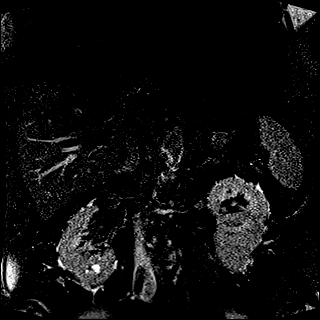

[Series 17: radials · coronal · 50.0mm · 0.78mm/px · 1 of 5 slices shown]
[im 1/5]
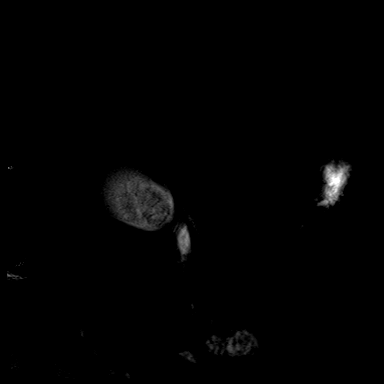

[Series 18: T1 dynamic · axial · non-contrast · 3.0mm · 1.31mm/px · z∈[-40,+245]mm · 3 of 96 slices shown (1 of 5)]
[im 1/96]
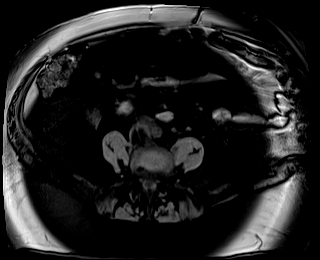
[im 48/96]
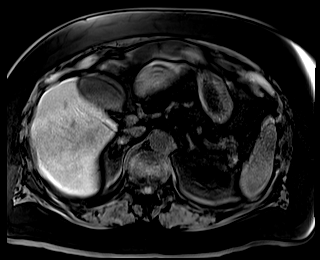
[im 96/96]
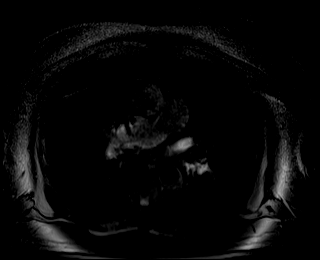

[Series 19: T1 dynamic post-contrast · axial · 3.0mm · 1.31mm/px · z∈[-40,+245]mm · 3 of 96 slices shown (1 of 5)]
[im 1/96]
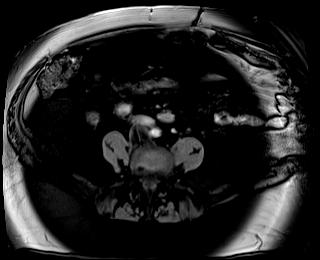
[im 48/96]
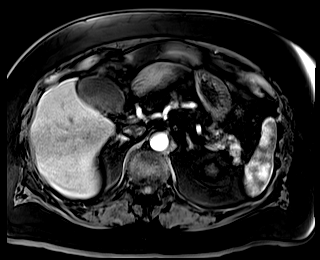
[im 96/96]
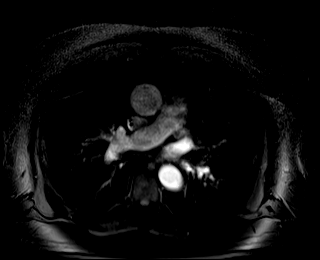

[Series 20: T1 dynamic · axial · 3.0mm · 1.31mm/px · z∈[-40,+245]mm · 3 of 96 slices shown (2 of 5)]
[im 1/96]
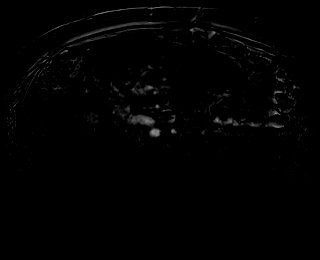
[im 48/96]
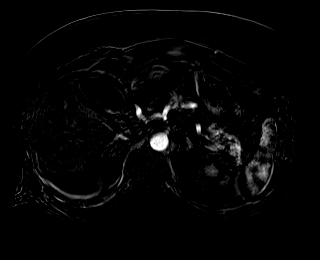
[im 96/96]
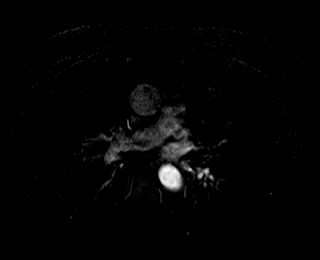

[Series 21: T1 dynamic post-contrast · axial · 3.0mm · 1.31mm/px · z∈[-40,+245]mm · 3 of 96 slices shown (2 of 5)]
[im 1/96]
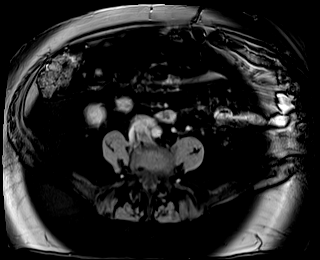
[im 48/96]
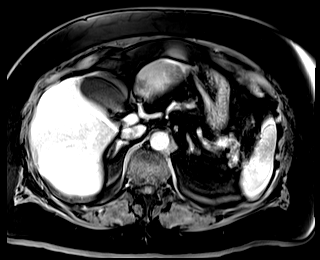
[im 96/96]
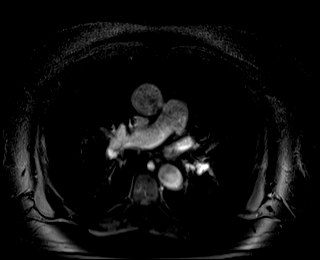

[Series 22: T1 dynamic · axial · 3.0mm · 1.31mm/px · z∈[-40,+245]mm · 3 of 96 slices shown (3 of 5)]
[im 1/96]
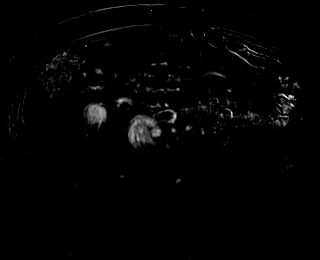
[im 48/96]
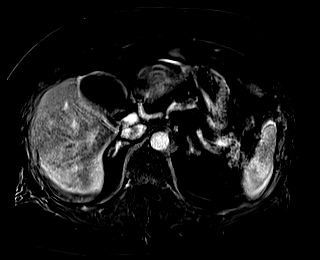
[im 96/96]
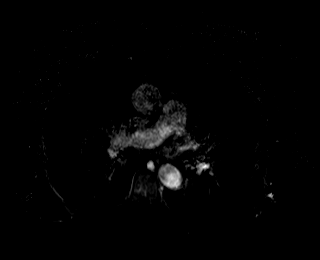

[Series 23: T1 dynamic post-contrast · axial · 3.0mm · 1.31mm/px · z∈[-40,+245]mm · 3 of 96 slices shown (3 of 5)]
[im 1/96]
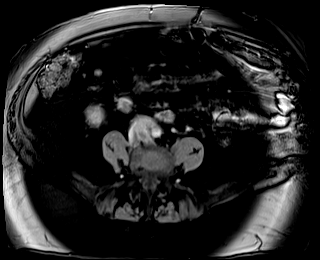
[im 48/96]
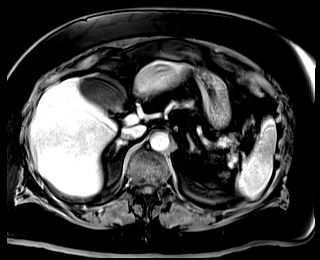
[im 96/96]
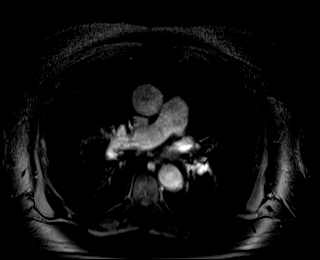

[Series 24: T1 dynamic · axial · 3.0mm · 1.31mm/px · z∈[-40,+245]mm · 3 of 96 slices shown (4 of 5)]
[im 1/96]
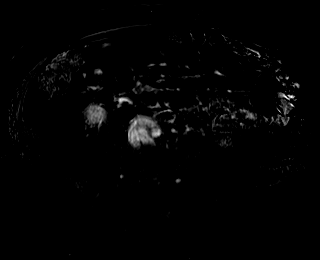
[im 48/96]
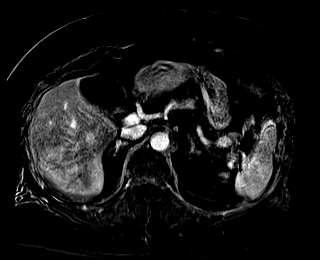
[im 96/96]
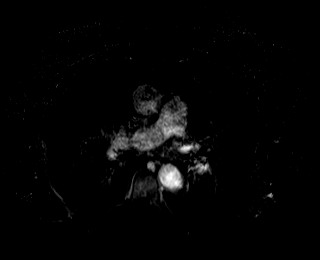

[Series 25: T1 dynamic post-contrast · axial · 3.0mm · 1.31mm/px · z∈[-40,+245]mm · 3 of 96 slices shown (4 of 5)]
[im 1/96]
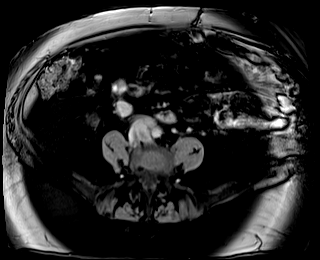
[im 48/96]
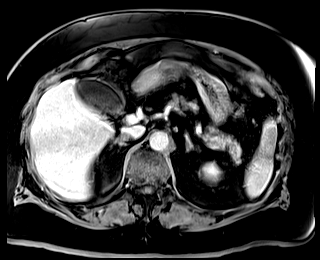
[im 96/96]
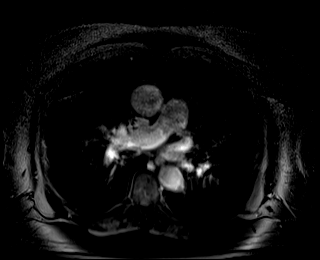

[Series 26: T1 dynamic · axial · 3.0mm · 1.31mm/px · z∈[-40,+245]mm · 3 of 96 slices shown (5 of 5)]
[im 1/96]
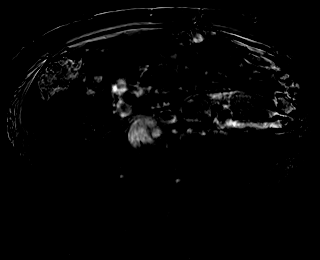
[im 48/96]
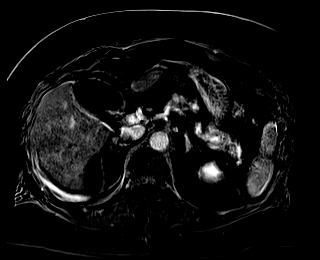
[im 96/96]
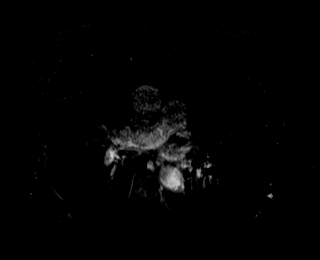

[Series 27: T1 dynamic post-contrast · coronal · 3.0mm · 1.38mm/px · 3 of 88 slices shown (5 of 5)]
[im 1/88]
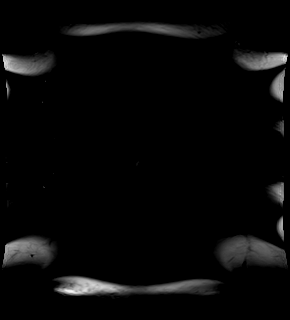
[im 44/88]
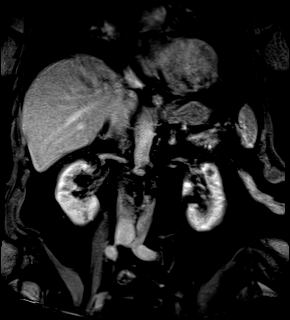
[im 88/88]
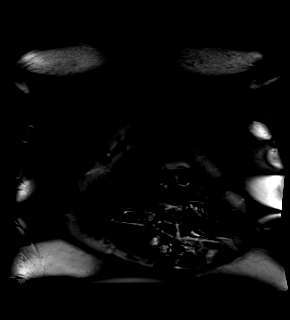

[46 of 48 positions shown; findings below may reference images not displayed]

FINDINGS: Lower chest: Unremarkable.

Hepatobiliary: Liver measures 17.2 cm craniocaudal length, enlarged.
Gallbladder is distended with multiple gallstones evident, measuring
in the 10-15 mm size range. No gallbladder wall thickening or
pericholecystic fluid. No intra or extrahepatic biliary duct
dilatation.

Pancreas: Approximately 6 tiny cystic foci are identified scattered
through the pancreatic parenchyma measuring up to about 10 mm
maximum size (tail on image [DATE]). No main duct dilatation. No
suspicious enhancement in the parenchyma or associated with any of
the tiny cyst.

Spleen:  No splenomegaly. No focal mass lesion.

Adrenals/Urinary Tract: No adrenal nodule or mass. Assessment of the
kidneys limited by breathing motion on postcontrast imaging but 10
mm simple cyst identified upper interpolar left kidney. Right kidney
unremarkable.

Stomach/Bowel: Stomach is unremarkable. No gastric wall thickening.
No evidence of outlet obstruction. Duodenum is normally positioned
as is the ligament of Treitz. Visualized small bowel and colonic
segments of the abdomen are unremarkable.

Vascular/Lymphatic: No abdominal aortic aneurysm. There is no
gastrohepatic or hepatoduodenal ligament lymphadenopathy. No
retroperitoneal or mesenteric lymphadenopathy.

Other:  No intraperitoneal free fluid.

Musculoskeletal: No suspicious marrow enhancement within the
visualized bony anatomy.
IMPRESSION: 1. No 6 cm liver mass as suggested on recent ultrasound exam. There
is no focal abnormality in the liver parenchyma by MRI today.
2. Approximately 6 tiny cystic foci scattered throughout the
pancreas measuring up to 10 mm maximum diameter. Repeat MRI in 12
months recommended to ensure stability. This recommendation follows
ACR consensus guidelines: Management of Incidental Pancreatic Cysts:
A White Paper of the ACR Incidental Findings Committee. [HOSPITAL] 3511;[DATE].
3. Cholelithiasis without gallbladder wall thickening or
pericholecystic fluid. No biliary dilatation.
4. Hepatomegaly.

## 2020-06-29 ENCOUNTER — Other Ambulatory Visit: Payer: Self-pay | Admitting: Geriatric Medicine

## 2020-06-29 DIAGNOSIS — R1011 Right upper quadrant pain: Secondary | ICD-10-CM | POA: Diagnosis not present

## 2020-07-07 ENCOUNTER — Ambulatory Visit
Admission: RE | Admit: 2020-07-07 | Discharge: 2020-07-07 | Disposition: A | Discharge status: Home / Self Care | Payer: PPO | Source: Ambulatory Visit | Attending: Geriatric Medicine | Admitting: Geriatric Medicine

## 2020-07-07 DIAGNOSIS — R1011 Right upper quadrant pain: Secondary | ICD-10-CM | POA: Diagnosis not present

## 2020-07-21 IMAGING — RF DG CHOLANGIOGRAM OPERATIVE
1 series · 12 of 12 positions shown · non-contrast
Comparison: MRCP-09/03/2019

CLINICAL DATA: Intraoperative cholangiogram during laparoscopic
cholecystectomy.

EXAM:
INTRAOPERATIVE CHOLANGIOGRAM
FLUOROSCOPY TIME:  41 seconds

[Series 1: run · 3 acquisitions, 12 frames shown]
[im 1/3]
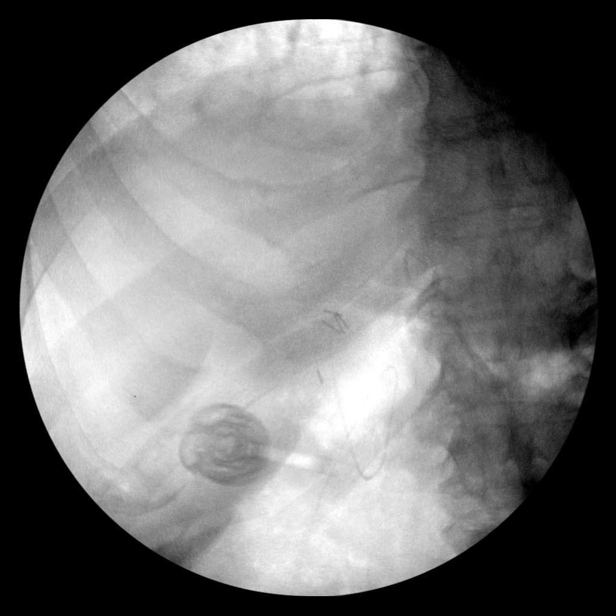
[im 1/3]
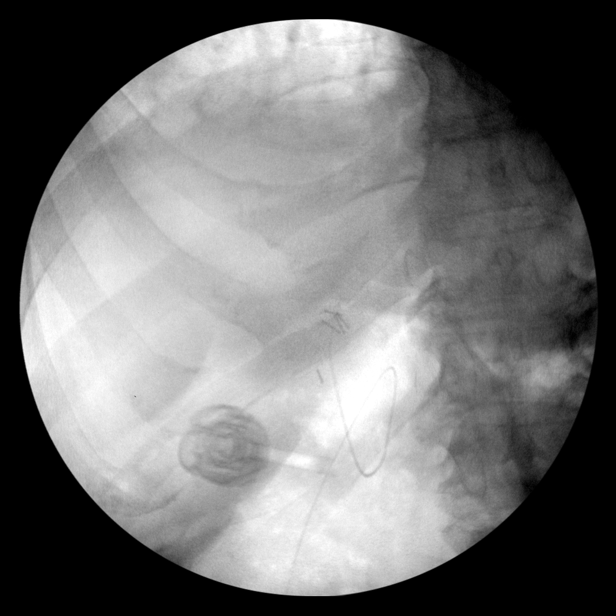
[im 1/3]
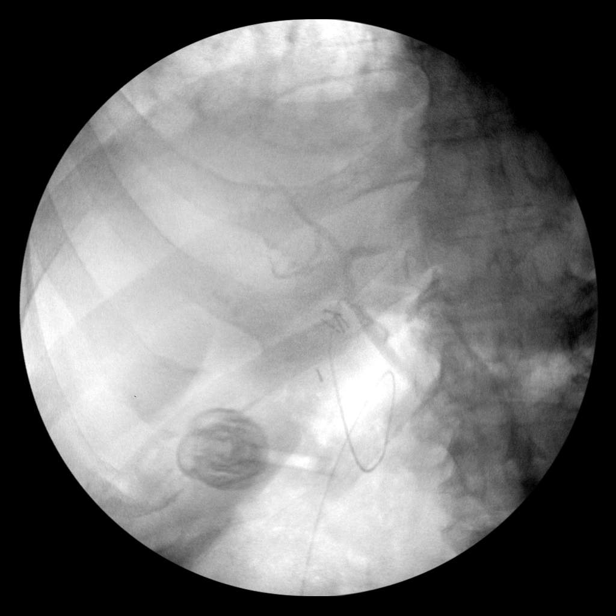
[im 1/3]
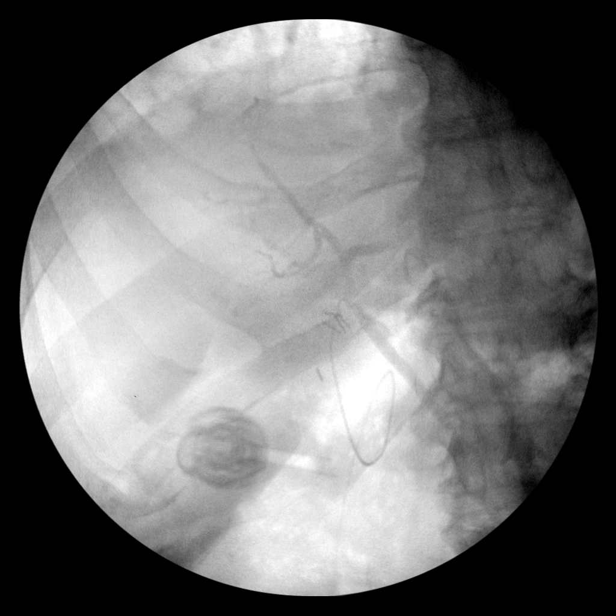
[im 2/3]
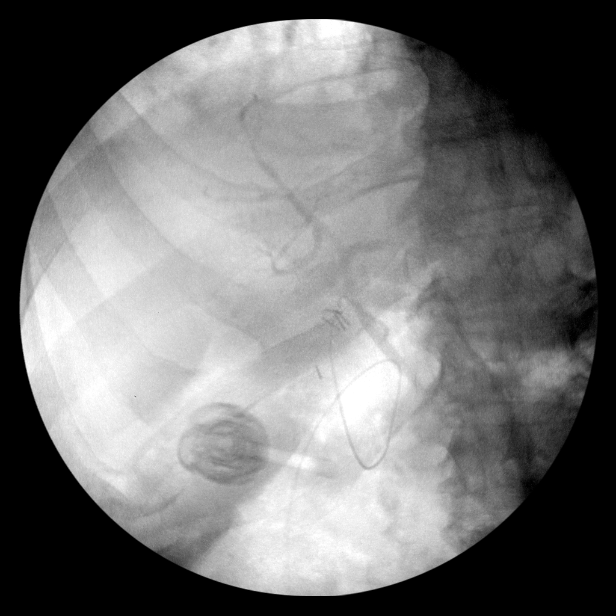
[im 2/3]
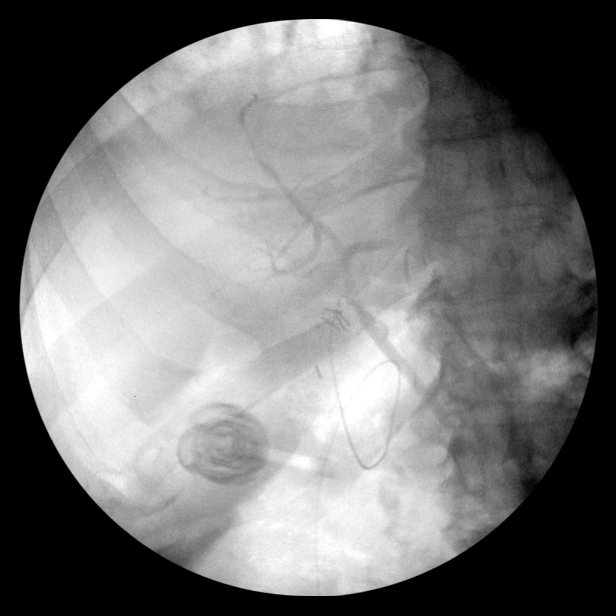
[im 2/3]
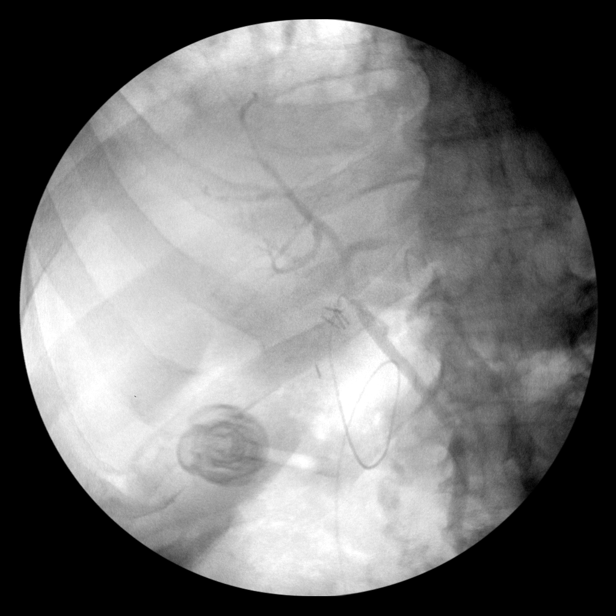
[im 2/3]
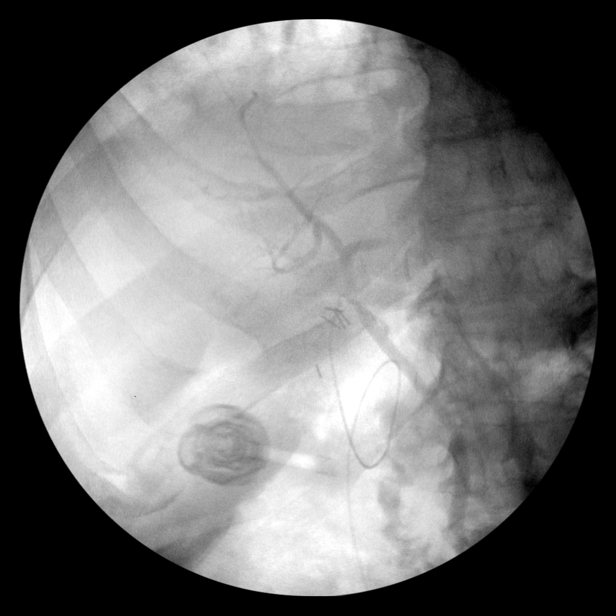
[im 3/3]
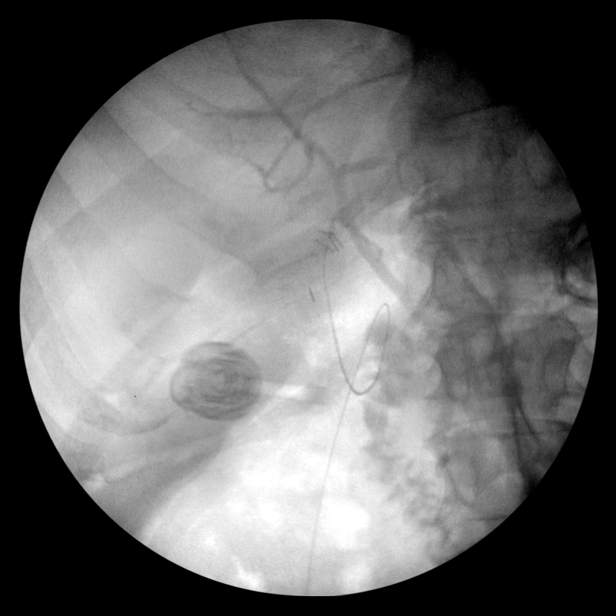
[im 3/3]
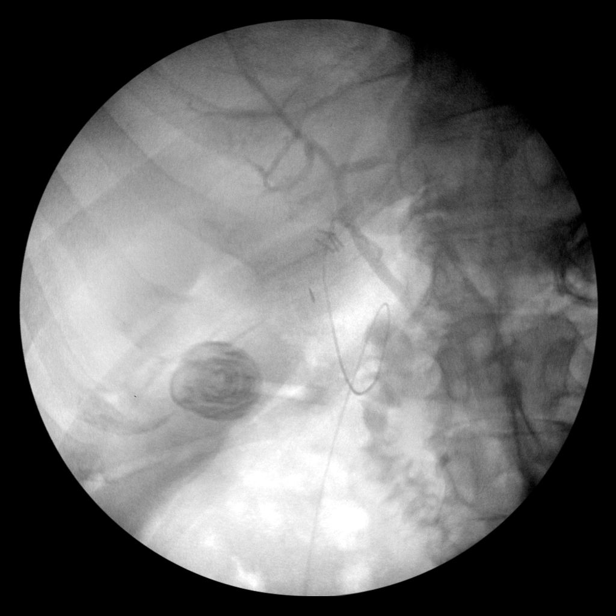
[im 3/3]
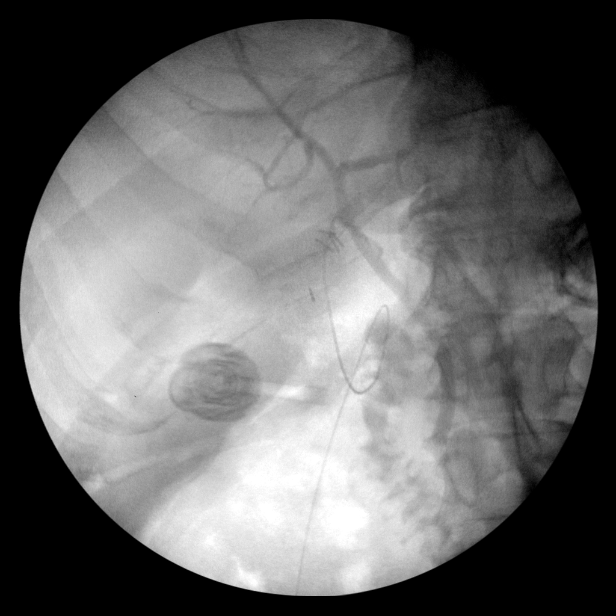
[im 3/3]
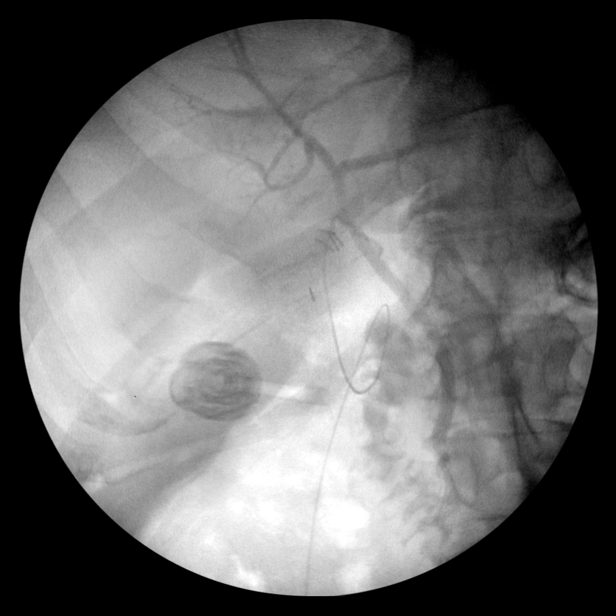

[12 of 12 positions shown; findings below may reference images not displayed]

FINDINGS: Intraoperative cholangiographic images of the right upper abdominal
quadrant during laparoscopic cholecystectomy are provided for
review.

Surgical clips overlie the expected location of the gallbladder
fossa.

Contrast injection demonstrates selective cannulation of the central
aspect of the cystic duct.

There is passage of contrast through the central aspect of the
cystic duct with filling of a non dilated common bile duct. There is
passage of contrast though the CBD and into the descending portion
of the duodenum.

There is minimal reflux of injected contrast into the common hepatic
duct and central aspect of the non dilated intrahepatic biliary
system.

There are no discrete filling defects within the opacified portions
of the biliary system to suggest the presence of
choledocholithiasis, though overall, the opacification of the
biliary system is relatively faint.
IMPRESSION: No definite evidence of choledocholithiasis.

## 2020-08-06 ENCOUNTER — Ambulatory Visit (INDEPENDENT_AMBULATORY_CARE_PROVIDER_SITE_OTHER): Payer: PPO

## 2020-08-06 ENCOUNTER — Other Ambulatory Visit: Payer: Self-pay

## 2020-08-06 DIAGNOSIS — I442 Atrioventricular block, complete: Secondary | ICD-10-CM

## 2020-08-06 LAB — CUP PACEART REMOTE DEVICE CHECK
Battery Remaining Longevity: 157 mo
Battery Voltage: 3.04 V
Brady Statistic AP VP Percent: 1.58 %
Brady Statistic AP VS Percent: 27.55 %
Brady Statistic AS VP Percent: 1.46 %
Brady Statistic AS VS Percent: 69.41 %
Brady Statistic RA Percent Paced: 28.98 %
Brady Statistic RV Percent Paced: 3.04 %
Date Time Interrogation Session: 20220331023659
Implantable Lead Implant Date: 20200702
Implantable Lead Implant Date: 20200702
Implantable Lead Location: 753859
Implantable Lead Location: 753860
Implantable Lead Model: 5076
Implantable Lead Model: 5076
Implantable Pulse Generator Implant Date: 20200702
Lead Channel Impedance Value: 342 Ohm
Lead Channel Impedance Value: 361 Ohm
Lead Channel Impedance Value: 437 Ohm
Lead Channel Impedance Value: 437 Ohm
Lead Channel Pacing Threshold Amplitude: 0.625 V
Lead Channel Pacing Threshold Amplitude: 0.875 V
Lead Channel Pacing Threshold Pulse Width: 0.4 ms
Lead Channel Pacing Threshold Pulse Width: 0.4 ms
Lead Channel Sensing Intrinsic Amplitude: 3.875 mV
Lead Channel Sensing Intrinsic Amplitude: 3.875 mV
Lead Channel Sensing Intrinsic Amplitude: 6.625 mV
Lead Channel Sensing Intrinsic Amplitude: 6.625 mV
Lead Channel Setting Pacing Amplitude: 1.5 V
Lead Channel Setting Pacing Amplitude: 2 V
Lead Channel Setting Pacing Pulse Width: 0.4 ms
Lead Channel Setting Sensing Sensitivity: 2.8 mV

## 2020-08-20 NOTE — Progress Notes (Signed)
Remote pacemaker transmission.   

## 2020-09-03 ENCOUNTER — Other Ambulatory Visit: Payer: Self-pay | Admitting: Geriatric Medicine

## 2020-09-03 ENCOUNTER — Other Ambulatory Visit (HOSPITAL_COMMUNITY): Payer: Self-pay | Admitting: Geriatric Medicine

## 2020-09-03 DIAGNOSIS — K862 Cyst of pancreas: Secondary | ICD-10-CM

## 2020-09-18 ENCOUNTER — Telehealth: Payer: Self-pay | Admitting: Cardiology

## 2020-09-18 NOTE — Telephone Encounter (Signed)
Spoke with the patient and advised him that per last visit with Melina Copa, PA-C that they would determine when he would need repeat echo and CT scan when he came for his annual visit. Patient did not previously want to have these tests done yearly and talked with Dayna about repeating them in 18 months instead of a year. Patient will discuss when he needs to have the tests done again when he comes in for his annual visit in 11/22.

## 2020-09-18 NOTE — Telephone Encounter (Signed)
  Patient sent a message via MyChart Patient schedule asking if he will need to have an annual echo and CT before his next follow up. I do not see orders for any at this time. Please advise.

## 2020-10-08 ENCOUNTER — Ambulatory Visit (HOSPITAL_COMMUNITY)
Admission: RE | Admit: 2020-10-08 | Discharge: 2020-10-08 | Disposition: A | Discharge status: Home / Self Care | Payer: PPO | Source: Ambulatory Visit | Attending: Geriatric Medicine | Admitting: Geriatric Medicine

## 2020-10-08 ENCOUNTER — Other Ambulatory Visit: Payer: Self-pay

## 2020-10-08 DIAGNOSIS — M47816 Spondylosis without myelopathy or radiculopathy, lumbar region: Secondary | ICD-10-CM | POA: Diagnosis not present

## 2020-10-08 DIAGNOSIS — N281 Cyst of kidney, acquired: Secondary | ICD-10-CM | POA: Diagnosis not present

## 2020-10-08 DIAGNOSIS — K862 Cyst of pancreas: Secondary | ICD-10-CM | POA: Diagnosis not present

## 2020-10-08 DIAGNOSIS — Z9049 Acquired absence of other specified parts of digestive tract: Secondary | ICD-10-CM | POA: Diagnosis not present

## 2020-10-08 MED ORDER — GADOBUTROL 1 MMOL/ML IV SOLN
10.0000 mL | Freq: Once | INTRAVENOUS | Status: AC | PRN
  Administered 2020-10-08: 10 mL via INTRAVENOUS

## 2020-10-08 NOTE — Progress Notes (Signed)
Per order, Changed device settings for MRI to DOO at 85 bpm   Tachy-therapies to off if applicable.   Will program device back to pre-MRI settings after completion of exam. 

## 2020-10-08 NOTE — Progress Notes (Signed)
Informed of MRI for today.   Device system confirmed to be MRI conditional, with implant date > 6 weeks ago and no evidence of abandoned or epicardial leads in review of most recent CXR Interrogation from today reviewed, pt is currently AS-VS at 70 bpm Change device settings for MRI to DOO at 85 bpm  Tachy-therapies to off if applicable.  Program device back to pre-MRI settings after completion of exam.  Shirley Friar, PA-C  10/08/2020 1:01 PM

## 2020-10-21 DIAGNOSIS — I714 Abdominal aortic aneurysm, without rupture: Secondary | ICD-10-CM | POA: Diagnosis not present

## 2020-10-21 DIAGNOSIS — F331 Major depressive disorder, recurrent, moderate: Secondary | ICD-10-CM | POA: Diagnosis not present

## 2020-10-21 DIAGNOSIS — E7800 Pure hypercholesterolemia, unspecified: Secondary | ICD-10-CM | POA: Diagnosis not present

## 2020-10-21 DIAGNOSIS — I700 Atherosclerosis of aorta: Secondary | ICD-10-CM | POA: Diagnosis not present

## 2020-10-21 DIAGNOSIS — I10 Essential (primary) hypertension: Secondary | ICD-10-CM | POA: Diagnosis not present

## 2020-10-27 ENCOUNTER — Ambulatory Visit: Payer: PPO | Attending: Internal Medicine

## 2020-10-27 ENCOUNTER — Other Ambulatory Visit (HOSPITAL_BASED_OUTPATIENT_CLINIC_OR_DEPARTMENT_OTHER): Payer: Self-pay

## 2020-10-27 DIAGNOSIS — Z23 Encounter for immunization: Secondary | ICD-10-CM

## 2020-10-27 MED ORDER — PFIZER-BIONT COVID-19 VAC-TRIS 30 MCG/0.3ML IM SUSP
INTRAMUSCULAR | 0 refills | Status: DC
  Filled 2020-10-27: qty 0.3, 1d supply, fill #0

## 2020-10-27 NOTE — Progress Notes (Signed)
   Covid-19 Vaccination Clinic  Name:  [name]    MRN: [id number] DOB: 1946-01-19  10/27/2020  [name] was observed post Covid-19 immunization for 15 minutes without incident. He was provided with Vaccine Information Sheet and instruction to access the V-Safe system.   [name] was instructed to call 911 with any severe reactions post vaccine: Difficulty breathing  Swelling of face and throat  A fast heartbeat  A bad rash all over body  Dizziness and weakness   Immunizations Administered     Name Date Dose VIS Date Route   PFIZER Comrnaty(Gray TOP) Covid-19 Vaccine 10/27/2020  2:54 PM 0.3 mL 04/16/2020 Intramuscular   Manufacturer: [place]   Lot: [id number]   [place]: [phone]

## 2020-11-05 ENCOUNTER — Ambulatory Visit (INDEPENDENT_AMBULATORY_CARE_PROVIDER_SITE_OTHER): Payer: PPO

## 2020-11-05 DIAGNOSIS — I442 Atrioventricular block, complete: Secondary | ICD-10-CM | POA: Diagnosis not present

## 2020-11-05 LAB — CUP PACEART REMOTE DEVICE CHECK
Battery Remaining Longevity: 153 mo
Battery Voltage: 3.03 V
Brady Statistic AP VP Percent: 1.68 %
Brady Statistic AP VS Percent: 34.11 %
Brady Statistic AS VP Percent: 0.77 %
Brady Statistic AS VS Percent: 63.44 %
Brady Statistic RA Percent Paced: 35.59 %
Brady Statistic RV Percent Paced: 2.45 %
Date Time Interrogation Session: 20220629222738
Implantable Lead Implant Date: 20200702
Implantable Lead Implant Date: 20200702
Implantable Lead Location: 753859
Implantable Lead Location: 753860
Implantable Lead Model: 5076
Implantable Lead Model: 5076
Implantable Pulse Generator Implant Date: 20200702
Lead Channel Impedance Value: 323 Ohm
Lead Channel Impedance Value: 342 Ohm
Lead Channel Impedance Value: 399 Ohm
Lead Channel Impedance Value: 418 Ohm
Lead Channel Pacing Threshold Amplitude: 0.625 V
Lead Channel Pacing Threshold Amplitude: 0.875 V
Lead Channel Pacing Threshold Pulse Width: 0.4 ms
Lead Channel Pacing Threshold Pulse Width: 0.4 ms
Lead Channel Sensing Intrinsic Amplitude: 3.75 mV
Lead Channel Sensing Intrinsic Amplitude: 3.75 mV
Lead Channel Sensing Intrinsic Amplitude: 6.875 mV
Lead Channel Sensing Intrinsic Amplitude: 6.875 mV
Lead Channel Setting Pacing Amplitude: 1.5 V
Lead Channel Setting Pacing Amplitude: 2 V
Lead Channel Setting Pacing Pulse Width: 0.4 ms
Lead Channel Setting Sensing Sensitivity: 2.8 mV

## 2020-11-19 ENCOUNTER — Other Ambulatory Visit: Payer: Self-pay | Admitting: Cardiology

## 2020-11-25 NOTE — Progress Notes (Signed)
Remote pacemaker transmission.   

## 2020-11-26 ENCOUNTER — Other Ambulatory Visit: Payer: Self-pay | Admitting: Cardiology

## 2020-11-30 ENCOUNTER — Other Ambulatory Visit: Payer: Self-pay

## 2020-12-01 ENCOUNTER — Other Ambulatory Visit: Payer: Self-pay

## 2020-12-01 ENCOUNTER — Encounter: Payer: Self-pay | Admitting: Cardiology

## 2020-12-01 ENCOUNTER — Ambulatory Visit: Payer: PPO | Admitting: Cardiology

## 2020-12-01 DIAGNOSIS — I442 Atrioventricular block, complete: Secondary | ICD-10-CM

## 2020-12-01 MED ORDER — METOPROLOL SUCCINATE ER 25 MG PO TB24: 25.0000 mg | ORAL_TABLET | Freq: Every day | ORAL | 0 refills | Status: DC

## 2020-12-01 MED ORDER — METOPROLOL SUCCINATE ER 25 MG PO TB24: 25.0000 mg | ORAL_TABLET | Freq: Every day | ORAL | 3 refills | Status: DC

## 2020-12-01 NOTE — Progress Notes (Signed)
Electrophysiology Office Note   Date:  12/01/2020   ID:  Clarence Andrade, DOB [birth year], MRN [id number]  PCP:  Lajean Manes, MD  Cardiologist:  Radford Pax Primary Electrophysiologist:  Lawayne Hartig Meredith Leeds, MD    No chief complaint on file.    History of Present Illness: Clarence Andrade is a 75 y.o. male who is being seen today for the evaluation of heart block at the request of Clarence Andrade. Presenting today for electrophysiology evaluation.    He has a history significant for mild aortic stenosis, coronary artery disease with CTO of the LAD, hyperlipidemia, OSA.  He has had history of intermittent heart block.  He is now status post Medtronic dual-chamber pacemaker implanted 11/08/2018.  Today, denies symptoms of palpitations, chest pain, shortness of breath, orthopnea, PND, lower extremity edema, claudication, dizziness, presyncope, syncope, bleeding, or neurologic sequela. The patient is tolerating medications without difficulties.  The past year he has done well.  He has no complaints.  He is able to do all of his daily activities.  He continues to go to the gym.   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 4.4cm by imaging 03/2020.   CAD (coronary artery disease)    a. Cath 03/2018 - CTO of the prox-mid LAD with bridging, L-L and R-L collaterals, moderate, non-obstructive disease involving the proximal LAD, proximal LCx, and distal RCA, more severe disease noted involving small branch vessels (RV marginal and superior branch of OM3).   Complete heart block (HCC)    Gallstones    Heart murmur    History of pacemaker    a. 11/2018 - Medtronic, intermittent CHB.   Hyperlipidemia    Morbid obesity (HCC)    Myocardial infarction [facility])    NSVT (nonsustained ventricular tachycardia) (HCC)    OSA (obstructive sleep apnea)    Severe PSG 1/07 AHI 66/hr, O2 Nadir  50% Refuses treatment - Pt does not believe test was accurate   Pericarditis    Pneumonia    Pulmonary HTN (HCC)    mild with PASP 63mHg by echo 02/2018, not seen on 03/2020 echo   Pulmonary nodule    Screening for AAA (abdominal aortic aneurysm) 2010   CT Abd/Pelvis   Ventral hernia    03/2009   Past Surgical History:  Procedure Laterality Date   CHOLECYSTECTOMY N/A 10/11/2019   Procedure: LAPAROSCOPIC CHOLECYSTECTOMY WITH INTRAOPERATIVE CHOLANGIOGRAM;  Surgeon: GArmandina Gemma MD;  Location: [facility];  Service: General;  Laterality: N/A;   CORONARY ANGIOGRAPHY N/A 03/23/2018   Procedure: CORONARY ANGIOGRAPHY;  Surgeon: ENelva Bush MD;  [facility];  Service: Cardiovascular;  Laterality: N/A;   HERNIA REPAIR  1/04   three   KNEE ARTHROSCOPY Left 05/04   PACEMAKER IMPLANT N/A 11/08/2018   Procedure: PACEMAKER IMPLANT;  Surgeon: CConstance Haw MD;  [facility];  Service: Cardiovascular;  Laterality: N/A;   TOTAL KNEE ARTHROPLASTY Bilateral 05/12   VENTRAL HERNIA REPAIR  03/2009     Current Outpatient Medications  Medication Sig Dispense Refill   acetaminophen (TYLENOL) 500 MG tablet Take 1,000 mg by mouth every 6 (six) hours as needed (pain).     aspirin EC 81 MG tablet Take 81 mg by mouth daily.     COVID-19 mRNA Vac-TriS, Pfizer, (PFIZER-BIONT COVID-19 VAC-TRIS) SUSP injection Inject into the  muscle. 0.3 mL 0   isosorbide mononitrate (IMDUR) 30 MG 24 hr tablet TAKE 1 TABLET ONCE DAILY. 90 tablet 0   lisinopril (ZESTRIL) 5 MG tablet Take 5 mg by mouth at bedtime.      Multiple Vitamin (MULTIVITAMIN) tablet Take 1 tablet by mouth daily.     nitroGLYCERIN (NITROSTAT) 0.4 MG SL tablet Place 1 tablet (0.4 mg total) under the tongue every 5 (five) minutes as needed for chest pain. 25 tablet 3   rosuvastatin (CRESTOR) 10 MG tablet Take 1 tablet (10 mg total) by mouth daily. 90 tablet 2   metoprolol succinate (TOPROL-XL) 25 MG 24 hr tablet Take 1  tablet (25 mg total) by mouth daily. 90 tablet 3   No current facility-administered medications for this visit.    Allergies:   Sertraline hcl   Social History:  The patient  reports that he has quit smoking. He has never used smokeless tobacco. He reports that he does not drink alcohol and does not use drugs.   Family History:  The patient's family history includes Aneurysm in his father; Heart attack in his father; Heart disease in his father.   ROS:  Please see the history of present illness.   Otherwise, review of systems is positive for none.   All other systems are reviewed and negative.   PHYSICAL EXAM: VS:  BP 120/80 (BP Location: Left Arm, Patient Position: Sitting, Cuff Size: Normal)   Pulse 60   Ht '6\' 1"'$  (1.854 m)   Wt 295 lb (133.8 kg)   SpO2 97%   BMI 38.92 kg/m  , BMI Body mass index is 38.92 kg/m. GEN: Well nourished, well developed, in no acute distress  HEENT: normal  Neck: no JVD, carotid bruits, or masses Cardiac: RRR; no murmurs, rubs, or gallops,no edema  Respiratory:  clear to auscultation bilaterally, normal work of breathing GI: soft, nontender, nondistended, + BS MS: no deformity or atrophy  Skin: warm and dry, device site well healed Neuro:  Strength and sensation are intact Psych: euthymic mood, full affect  EKG:  EKG is ordered today. Personal review of the ekg ordered shows sinus rhythm, rate 60  Personal review of the device interrogation today. Results in [place]: 03/04/2020: BUN 15; Creatinine, Ser 0.78; Potassium 4.4; Sodium 144    Lipid Panel     Component Value Date/Time   CHOL 128 09/26/2019 0925   TRIG 71 09/26/2019 0925   HDL 48 09/26/2019 0925   CHOLHDL 2.7 09/26/2019 0925   CHOLHDL 3.2 06/05/2010 0715   VLDL 10 06/05/2010 0715   LDLCALC 66 09/26/2019 0925     Wt Readings from Last 3 Encounters:  12/01/20 295 lb (133.8 kg)  03/26/20 (!) 304 lb (137.9 kg)  11/19/19 (!) 301 lb 12.8 oz (136.9 kg)       Other studies Reviewed: Additional studies/ records that were reviewed today include: TTE 03/08/2018 Review of the above records today demonstrates:  Left ventricle: The cavity size was normal. There was severe   concentric hypertrophy. Systolic function was normal. The   estimated ejection fraction was in the range of 55% to 60%. Wall   motion was normal; there were no regional wall motion   abnormalities. There was an increased relative contribution of   atrial contraction to ventricular filling. Doppler parameters are   consistent with abnormal left ventricular relaxation (grade 1   diastolic dysfunction). - Aortic valve: Moderately calcified annulus. Trileaflet; mildly   thickened, mildly calcified  leaflets. There was mild stenosis.   There was trivial regurgitation. Mean gradient (S): 15 mm Hg.   Valve area (VTI): 1.53 cm^2. Valve area (Vmax): 1.48 cm^2. Valve   area (Vmean): 1.46 cm^2. - Aorta: Ascending aorta diameter: 41 mm (ED). - Ascending aorta: The ascending aorta was mildly dilated. - Mitral valve: Calcified annulus. There was mild regurgitation. - Left atrium: The atrium was mildly dilated. - Pulmonic valve: There was mild regurgitation. - Pulmonary arteries: PA peak pressure: 39 mm Hg (S).  LHC 03/23/18 Severe single-vessel coronary artery disease, with chronic total occlusion of the proximal to mid LAD with bridging, left-to-left, and right-to-left collaterals. Moderate, non-obstructive disease involving the proximal LAD, proximal LCx, and distal RCA.  More severe disease noted involving small branch vessels (RV marginal and superior branch of OM3). Inability to cross the aortic valve for left heart catheterization due to insufficient catheter length complicated by tortuous subclavian artery and dilated ascending aorta.  LVEF noted to be normal on recent echocardiogram.  ASSESSMENT AND PLAN:  1.  Intermittent complete heart block: Status post Medtronic dual-chamber  pacemaker implanted 11/08/2018.  Device functioning appropriately.  No changes at this time.  2.  Coronary artery disease: Status post CTO of the LAD.  No current chest pain.  3.  Hyperlipidemia: Goal LDL less than 70.  Crestor per primary cardiology.  Current medicines are reviewed at length with the patient today.   The patient does not have concerns regarding his medicines.  The following changes were made today:  none  Labs/ tests ordered today include:  Orders Placed This Encounter  Procedures   EKG 12-Lead     Disposition:   FU with Jaun Galluzzo 12 months  Signed, Nevaeha Finerty Meredith Leeds, MD  12/01/2020 4:12 PM     [address] [place] [address] [phone] (office) [phone] (fax)

## 2021-01-05 DIAGNOSIS — H2513 Age-related nuclear cataract, bilateral: Secondary | ICD-10-CM | POA: Diagnosis not present

## 2021-01-05 DIAGNOSIS — H52203 Unspecified astigmatism, bilateral: Secondary | ICD-10-CM | POA: Diagnosis not present

## 2021-02-04 ENCOUNTER — Ambulatory Visit (INDEPENDENT_AMBULATORY_CARE_PROVIDER_SITE_OTHER): Payer: PPO

## 2021-02-04 DIAGNOSIS — I442 Atrioventricular block, complete: Secondary | ICD-10-CM | POA: Diagnosis not present

## 2021-02-04 LAB — CUP PACEART REMOTE DEVICE CHECK
Battery Remaining Longevity: 150 mo
Battery Voltage: 3.03 V
Brady Statistic AP VP Percent: 1.02 %
Brady Statistic AP VS Percent: 30.3 %
Brady Statistic AS VP Percent: 0.73 %
Brady Statistic AS VS Percent: 67.95 %
Brady Statistic RA Percent Paced: 31.34 %
Brady Statistic RV Percent Paced: 1.75 %
Date Time Interrogation Session: 20220928225637
Implantable Lead Implant Date: 20200702
Implantable Lead Implant Date: 20200702
Implantable Lead Location: 753859
Implantable Lead Location: 753860
Implantable Lead Model: 5076
Implantable Lead Model: 5076
Implantable Pulse Generator Implant Date: 20200702
Lead Channel Impedance Value: 342 Ohm
Lead Channel Impedance Value: 342 Ohm
Lead Channel Impedance Value: 399 Ohm
Lead Channel Impedance Value: 437 Ohm
Lead Channel Pacing Threshold Amplitude: 0.5 V
Lead Channel Pacing Threshold Amplitude: 0.875 V
Lead Channel Pacing Threshold Pulse Width: 0.4 ms
Lead Channel Pacing Threshold Pulse Width: 0.4 ms
Lead Channel Sensing Intrinsic Amplitude: 10.25 mV
Lead Channel Sensing Intrinsic Amplitude: 10.25 mV
Lead Channel Sensing Intrinsic Amplitude: 3.875 mV
Lead Channel Sensing Intrinsic Amplitude: 3.875 mV
Lead Channel Setting Pacing Amplitude: 1.5 V
Lead Channel Setting Pacing Amplitude: 2 V
Lead Channel Setting Pacing Pulse Width: 0.4 ms
Lead Channel Setting Sensing Sensitivity: 2.8 mV

## 2021-02-10 ENCOUNTER — Other Ambulatory Visit: Payer: Self-pay | Admitting: Cardiology

## 2021-02-15 NOTE — Progress Notes (Signed)
Remote pacemaker transmission.   

## 2021-02-17 DIAGNOSIS — M9904 Segmental and somatic dysfunction of sacral region: Secondary | ICD-10-CM | POA: Diagnosis not present

## 2021-02-17 DIAGNOSIS — M9903 Segmental and somatic dysfunction of lumbar region: Secondary | ICD-10-CM | POA: Diagnosis not present

## 2021-02-17 DIAGNOSIS — M9905 Segmental and somatic dysfunction of pelvic region: Secondary | ICD-10-CM | POA: Diagnosis not present

## 2021-02-17 DIAGNOSIS — M5136 Other intervertebral disc degeneration, lumbar region: Secondary | ICD-10-CM | POA: Diagnosis not present

## 2021-02-22 DIAGNOSIS — M9905 Segmental and somatic dysfunction of pelvic region: Secondary | ICD-10-CM | POA: Diagnosis not present

## 2021-02-22 DIAGNOSIS — M9903 Segmental and somatic dysfunction of lumbar region: Secondary | ICD-10-CM | POA: Diagnosis not present

## 2021-02-22 DIAGNOSIS — M9904 Segmental and somatic dysfunction of sacral region: Secondary | ICD-10-CM | POA: Diagnosis not present

## 2021-02-22 DIAGNOSIS — M5136 Other intervertebral disc degeneration, lumbar region: Secondary | ICD-10-CM | POA: Diagnosis not present

## 2021-03-01 ENCOUNTER — Other Ambulatory Visit: Payer: Self-pay | Admitting: Physician Assistant

## 2021-03-27 NOTE — Progress Notes (Addendum)
Cardiology Office Note    Date:  03/30/2021   ID:  Clarence Andrade, DOB [birth year], MRN [id number]  PCP:  Clarence Manes, MD  Cardiologist:  Clarence Him, MD / Primary APP - Clarence Copa PA-C Electrophysiologist:  Clarence Haw, MD   Chief Complaint: f/u CAD  History of Present Illness:   Clarence Andrade is a 75 y.o. male with history of CAD by cath 03/2018 as below, ascending aortic aneurysm, aortic stenosis (mild by 03/2020 echo), CHB s/p Medtronic PPM 11/2018, pericarditis, prior hyperglycemia, hyponatremia, dyslipidemia, pulmonary nodule, adrenal incidentaloma, mild pulmonary HTN (normal by ubsequent echoes), prior brief NSVT per PPM interrogations, essential HTN, morbid obesity, & ventral hernia who presents for routine follow-up.    He had remote diagnosis of pericarditis and also CAD. In 2019 he developed chest pain that was potentially felt due to recurrent pericarditis, but also underwent concomitant workup for coronary disease. He underwent nuclear stress test in 03/2018 that was abnormal prompting cath which showed CTO of the prox-mid LAD with bridging, L-L and R-L collaterals, moderate non-obstructive disease involving the proximal LAD, proximal LCx, and distal RCA, more severe disease noted involving small branch vessels (RV marginal and superior branch of OM3). Cath notable for inability to cross the aortic valve for left heart catheterization due to insufficient catheter length complicated by tortuous subclavian artery and dilated ascending aorta. His antianginal therapy was titrated with recommendation to reserve CTO PCI for refractory angina. Cardiac MRI 04/09/18 done alongside workup showed findings below with no evidence for pericardial inflammation or pericardial effusion. He has history of fatigue but has declined evaluation for OSA.    In 2020 he developed near-syncope and was found to have intermitted complete heart block on outpatient monitor with continued bradycardia  despite BB cessation, ultimately requiring PPM 11/2018. Prior device interrogations have shown very short runs of NSVT prompting initiation of metoprolol. He was trialed on higher dose 50mg  daily but did not tolerate due to headaches/fatigue so it was reduced back to 25mg  daily. Last echo 03/10/20 EF 55-60%, grade 1 DD, moderate LAE, mild aortic stenosis, moderate dilation of ascending aorta. Routine f/u CTA 03/2020 showed unchanged appearance/size of ascending aorta measuring 23mm and unchanged pulmonary nodule felt benign.  He is seen back for follow-up doing well. He continues to participate in aqua-aerobics without angina. He denies any cardiac complaints today. He was started on Wellbutrin from last OV. He is down a 7 pounds from prior visit - states he was actually doing better over the summer and then regained a few pounds after dietary habits lapsed.  Labwork independently reviewed: KPN 10/2020 K 5.2, Cr 0.860 02/2020 BMET wnl 09/2019 CBC wnl, LFTs wnl, TSH wnl, Mg 2.1, LDL 66   Past Medical History:  Diagnosis Date   Abdominal hernia    Midline   Adrenal incidentaloma ([place]) 2010   Aortic stenosis    Ascending aortic aneurysm    a. 4.4cm by imaging 03/2020.   CAD (coronary artery disease)    a. Cath 03/2018 - CTO of the prox-mid LAD with bridging, L-L and R-L collaterals, moderate, non-obstructive disease involving the proximal LAD, proximal LCx, and distal RCA, more severe disease noted involving small branch vessels (RV marginal and superior branch of OM3).   Complete heart block (HCC)    Gallstones    Heart murmur    History of pacemaker    a. 11/2018 - Medtronic, intermittent CHB.   Hyperlipidemia    Morbid obesity ([place])  Myocardial infarction [facility])    NSVT (nonsustained ventricular tachycardia)    OSA (obstructive sleep apnea)    Severe PSG 1/07 AHI 66/hr, O2 Nadir 50% Refuses treatment - Pt does not believe test was accurate   Pericarditis    Pneumonia    Pulmonary HTN  (HCC)    mild with PASP 94mmHg by echo 02/2018, not seen on 03/2020 echo   Pulmonary nodule    Screening for AAA (abdominal aortic aneurysm) 2010   CT Abd/Pelvis   Ventral hernia    03/2009    Past Surgical History:  Procedure Laterality Date   CHOLECYSTECTOMY N/A 10/11/2019   Procedure: LAPAROSCOPIC CHOLECYSTECTOMY WITH INTRAOPERATIVE CHOLANGIOGRAM;  Surgeon: Armandina Gemma, MD;  Location: [facility];  Service: General;  Laterality: N/A;   CORONARY ANGIOGRAPHY N/A 03/23/2018   Procedure: CORONARY ANGIOGRAPHY;  Surgeon: Nelva Bush, MD;  Location: [place] [facility];  Service: Cardiovascular;  Laterality: N/A;   HERNIA REPAIR  1/04   three   KNEE ARTHROSCOPY Left 05/04   PACEMAKER IMPLANT N/A 11/08/2018   Procedure: PACEMAKER IMPLANT;  Surgeon: Clarence Haw, MD;  Location: [place] CV LAB;  Service: Cardiovascular;  Laterality: N/A;   TOTAL KNEE ARTHROPLASTY Bilateral 05/12   VENTRAL HERNIA REPAIR  03/2009    Current Medications: Current Meds  Medication Sig   acetaminophen (TYLENOL) 500 MG tablet Take 1,000 mg by mouth every 6 (six) hours as needed (pain).   aspirin EC 81 MG tablet Take 81 mg by mouth daily.   buPROPion (WELLBUTRIN XL) 150 MG 24 hr tablet Take 150 mg by mouth every morning.   COVID-19 mRNA Vac-TriS, Pfizer, (PFIZER-BIONT COVID-19 VAC-TRIS) SUSP injection Inject into the muscle.   isosorbide mononitrate (IMDUR) 30 MG 24 hr tablet TAKE 1 TABLET ONCE DAILY.   lisinopril (ZESTRIL) 5 MG tablet Take 5 mg by mouth at bedtime.    metoprolol succinate (TOPROL-XL) 25 MG 24 hr tablet Take 1 tablet (25 mg total) by mouth daily.   Multiple Vitamin (MULTIVITAMIN) tablet Take 1 tablet by mouth daily.   nitroGLYCERIN (NITROSTAT) 0.4 MG SL tablet Place 1 tablet (0.4 mg total) under the tongue every 5 (five) minutes as needed for chest pain.   rosuvastatin (CRESTOR) 10 MG tablet TAKE 1 TABLET ONCE DAILY.     Allergies:   Sertraline hcl   Social History   Socioeconomic  History   Marital status: Married    Spouse name: Not on file   Number of children: Not on file   Years of education: Not on file   Highest education level: Not on file  Occupational History   Not on file  Tobacco Use   Smoking status: Former   Smokeless tobacco: Never  Vaping Use   Vaping Use: Never used  Substance and Sexual Activity   Alcohol use: No   Drug use: No   Sexual activity: Not on file  Other Topics Concern   Not on file  Social History Narrative   Not on file   Social Determinants of Health   Financial Resource Strain: Not on file  Food Insecurity: Not on file  Transportation Needs: Not on file  Physical Activity: Not on file  Stress: Not on file  Social Connections: Not on file     Family History:  The patient's family history includes Aneurysm in his father; Heart attack in his father; Heart disease in his father.  ROS:   Please see the history of present illness. All other systems are reviewed  and otherwise negative.    EKGs/Labs/Other Studies Reviewed:    Studies reviewed are outlined and summarized above. Reports included below if pertinent.  CT Angio 03/10/20 FINDINGS: Cardiovascular:   Heart:   Heart size unchanged. No pericardial fluid/thickening. Left chest wall cardiac pacing device with 2 leads terminating in the right atrium in the apex of the right ventricle. Calcifications of the left anterior descending, circumflex, right coronary arteries.   Aorta:   Annulus estimated 32 mm on the coronal images. Mild calcifications of the aortic valve. Estimated diameter of the sino-tubular junction 30 mm. Greatest diameter of ascending aorta is estimated 44 mm. My measurement on the prior CT estimated 44 mm.   Atherosclerotic changes of the aortic arch and the proximal branch vessels. Branch vessels are patent with tortuous course.   Mild atherosclerosis of the descending thoracic aorta. Distal thoracic aorta measures 2.6 cm above the  hiatus. No periaortic fluid. No dissection.   Pulmonary arteries:   No central, lobar, segmental, or proximal subsegmental filling defects.   Mediastinum/Nodes: No mediastinal adenopathy. Unremarkable appearance of the thoracic esophagus.   Unremarkable thoracic inlet.   Lungs/Pleura: Central airways are clear. No pleural effusion. No confluent airspace disease.   No pneumothorax.   Unchanged nodule in the lateral left lower lobe, sub-pleural location. Estimated 28mm.   Upper Abdomen: No acute.   Musculoskeletal: No acute displaced fracture. Degenerative changes of the spine.   Review of the MIP images confirms the above findings.   IMPRESSION: Relatively unchanged appearance and size of the ascending aorta, maximum diameter estimated 44 mm currently. Recommend annual imaging followup by CTA or MRA. This recommendation follows 2010 ACCF/AHA/AATS/ACR/ASA/SCA/SCAI/SIR/STS/SVM Guidelines for the Diagnosis and Management of Patients with Thoracic Aortic Disease. Circulation. 2010; 121: I948-N462. Aortic aneurysm NOS (ICD10-I71.9)   Aortic Atherosclerosis (ICD10-I70.0). Associated coronary artery disease.   Unchanged small nodule at the left lung base, likely benign.   Signed,   Dulcy Fanny. Dellia Nims, RPVI   [facility]   [facility]     Electronically Signed   By: Corrie Mckusick D.O.   On: 03/11/2020 16:34   2D echo 03/2020  1. Left ventricular ejection fraction, by estimation, is 55 to 60%. The  left ventricle has normal function. The left ventricle has no regional  wall motion abnormalities. There is mild concentric left ventricular  hypertrophy. Left ventricular diastolic  parameters are consistent with Grade I diastolic dysfunction (impaired  relaxation).   2. Right ventricular systolic function is normal. The right ventricular  size is normal. There is normal pulmonary artery systolic pressure.   3. Left atrial  size was moderately dilated.   4. The mitral valve is normal in structure. Trivial mitral valve  regurgitation. No evidence of mitral stenosis.   5. The aortic valve is normal in structure. There is moderate  calcification of the aortic valve. There is moderate thickening of the  aortic valve. Aortic valve regurgitation is not visualized. Mild aortic  valve stenosis. Aortic valve area, by VTI  measures 2.62 cm. Aortic valve mean gradient measures 12.0 mmHg. Aortic  valve Vmax measures 2.70 m/s.   6. Aortic dilatation noted. There is moderate dilatation of the ascending  aorta, measuring 45 mm. There is mild dilatation at the level of the  sinuses of Valsalva, measuring 42 mm.   7. The inferior vena cava is normal in size with greater than 50%  respiratory variability, suggesting right atrial pressure of 3 mmHg.    LHC  03/2018 Conclusions: Severe single-vessel coronary artery disease, with chronic total occlusion of the proximal to mid LAD with bridging, left-to-left, and right-to-left collaterals. Moderate, non-obstructive disease involving the proximal LAD, proximal LCx, and distal RCA.  More severe disease noted involving small branch vessels (RV marginal and superior branch of OM3). Inability to cross the aortic valve for left heart catheterization due to insufficient catheter length complicated by tortuous subclavian artery and dilated ascending aorta.  LVEF noted to be normal on recent echocardiogram.   Recommendations: Medical therapy and secondary prevention.  I will start isosorbide mononitrate 30 mg daily. If Mr. Dutter continues to have symptoms despite optimal medical therapy (max tolerated doses of at least 2 antianginal agents), PCI to CTO of the LAD should be considered. Follow-up with Dr. Radford Pax or APP in ~2 weeks.   Recommend aspirin 81mg  daily for moderate/severe CAD.   Nelva Bush, MD [facility] Pager: [phone]   CMRI 2019 FINDINGS: Limited images of  the lung fields show no gross abnormalities.   The ascending aorta is mildly dilated at 4.0 cm.   No pericardial effusion noted, no pericardial thickening noted.   Normal left ventricular size with mild LV hypertrophy. Mild anteroseptal hypokinesis with EF 56%. The right ventricle is mildly dilated, normal systolic function with EF 66%. Mild right and left atrial enlargement. Thickened aortic valve with trivial regurgitation. Mild mitral regurgitation.   On delayed enhancement imaging, there was < 50% wall thickness subendocardial late gadolinium enhancement (LGE) in the mid to apical anteroseptal wall.   Measurements:   LVEDV 160 mL   LVSV 90 mL   LV EF 56%   RVEDV 140 mL   RVSV 93 mL   RV EF 66%   IMPRESSION: 1. Normal LV size with mild LV hypertrophy. EF 56% with mild mid to apical anteroseptal hypokinesis.   2.  Mildly dilated RV with normal systolic function, EF 35%.   3. <50% wall thickness subendocardial LGE in the mid-apical anteroseptal wall, consistent with prior infarction.   4. No evidence for pericardial inflammation or pericardial effusion.   Dalton Mclean     Electronically Signed   By: Loralie Champagne M.D.   On: 04/09/2018 22:52    EKG:  EKG is not ordered today but reviewed recent tracing 12/01/20 showing NSR 60bpm, first degree AVB, poor R wave progression, TWI I, avL stable compared to prior  Recent Labs: No results found for requested labs within last 8760 hours.  Recent Lipid Panel    Component Value Date/Time   CHOL 128 09/26/2019 0925   TRIG 71 09/26/2019 0925   HDL 48 09/26/2019 0925   CHOLHDL 2.7 09/26/2019 0925   CHOLHDL 3.2 06/05/2010 0715   VLDL 10 06/05/2010 0715   LDLCALC 66 09/26/2019 0925    PHYSICAL EXAM:    VS:  BP 110/80   Pulse 60   Ht 6' (1.829 m)   Wt 297 lb 3.2 oz (134.8 kg)   SpO2 96%   BMI 40.31 kg/m   BMI: Body mass index is 40.31 kg/m.  GEN: Well nourished, well developed male in no acute  distress HEENT: normocephalic, atraumatic Neck: no JVD, carotid bruits, or masses Cardiac: RRR; soft SEM RUSB, no rubs or gallops, no edema  Respiratory:  clear to auscultation bilaterally, normal work of breathing (had faint end expiratory RUL wheeze cleared by coughing) GI: soft, nontender, nondistended, + BS MS: no deformity or atrophy Skin: warm and dry, no rash Neuro:  Alert and Oriented x  3, Strength and sensation are intact, follows commands Psych: euthymic mood, full affect  Wt Readings from Last 3 Encounters:  03/30/21 297 lb 3.2 oz (134.8 kg)  12/01/20 295 lb (133.8 kg)  03/26/20 (!) 304 lb (137.9 kg)     ASSESSMENT & PLAN:   1. CAD - doing well without angina, continues to swim regularly in aqua-aerobics. Continue ASA, isosorbide, metoprolol and rosuvastatin. We reviewed his medicines and their indications today per his request. Update CBC, CMET, lipid profile. Also no residual pericarditis symptoms at this time.  2. Mild aortic stenosis - last echocardiogram suggested mild stenosis. Normal structure per report. He is asymptomatic. Historically he prefers a conservative approach to testing. Consider repeat echocardiogram in 2024, sooner if symptoms dictate. We are also watching his TAA as well.   3. NSVT- no recent symptoms related to this, followed by Dr. Curt Bears, on Toprol 25mg  daily (did not tolerate high doses due to HA/fatigue). Check lytes and thyroid today.  4. Dyslipidemia - last lipids reviewed. Continue statin and update labs today. He is on current dose of Crestor due to myalgias at higher dose.  5. Ascending aortic aneurysm - previously discussed this diagnosis and precautions in depth with patient at prior visits. We briefly revisited these today, including avoidance of lifting over 30lb and avoidance of fluoroquinolones. I recommended we repeat CTA now. He has decided to defer this until summer 2023. He will notify us if he changes his mind about proceeding sooner.  Will also keep this diagnosis in mind in the context of his aortic stenosis.    Disposition: F/u with myself or Dr. Radford Pax in 1 year (patient was previously following q6 months but has since preferred yearly f/u with myself). He is also following with Dr. Curt Bears as well.   Medication Adjustments/Labs and Tests Ordered: Current medicines are reviewed at length with the patient today.  Concerns regarding medicines are outlined above. Medication changes, Labs and Tests ordered today are summarized above and listed in the Patient Instructions accessible in Encounters.   Signed, Charlie Pitter, PA-C  03/30/2021 10:56 AM    [address], [place], [place]  [address] Phone: [phone]; Fax: [phone]

## 2021-03-30 ENCOUNTER — Encounter: Payer: Self-pay | Admitting: Physician Assistant

## 2021-03-30 ENCOUNTER — Ambulatory Visit: Payer: PPO | Admitting: Physician Assistant

## 2021-03-30 ENCOUNTER — Other Ambulatory Visit: Payer: Self-pay

## 2021-03-30 VITALS — BP 110/80 | HR 60 | Ht 72.0 in | Wt 297.2 lb

## 2021-03-30 DIAGNOSIS — I4729 Other ventricular tachycardia: Secondary | ICD-10-CM | POA: Diagnosis not present

## 2021-03-30 DIAGNOSIS — E785 Hyperlipidemia, unspecified: Secondary | ICD-10-CM | POA: Diagnosis not present

## 2021-03-30 DIAGNOSIS — I350 Nonrheumatic aortic (valve) stenosis: Secondary | ICD-10-CM

## 2021-03-30 DIAGNOSIS — I7121 Aneurysm of the ascending aorta, without rupture: Secondary | ICD-10-CM | POA: Diagnosis not present

## 2021-03-30 DIAGNOSIS — I2510 Atherosclerotic heart disease of native coronary artery without angina pectoris: Secondary | ICD-10-CM

## 2021-03-30 LAB — LIPID PANEL
Chol/HDL Ratio: 2.8 ratio (ref 0.0–5.0)
Cholesterol, Total: 130 mg/dL (ref 100–199)
HDL: 46 mg/dL (ref >39)
LDL Chol Calc (NIH): 61 mg/dL (ref 0–99)
Triglycerides: 133 mg/dL (ref 0–149)
VLDL Cholesterol Cal: 23 mg/dL (ref 5–40)

## 2021-03-30 LAB — COMPREHENSIVE METABOLIC PANEL
ALT: 18 IU/L (ref 0–44)
AST: 20 IU/L (ref 0–40)
Albumin/Globulin Ratio: 1.5 (ref 1.2–2.2)
Albumin: 4.1 g/dL (ref 3.7–4.7)
Alkaline Phosphatase: 73 IU/L (ref 44–121)
BUN/Creatinine Ratio: 18 (ref 10–24)
BUN: 16 mg/dL (ref 8–27)
Bilirubin Total: 0.4 mg/dL (ref 0.0–1.2)
CO2: 29 mmol/L (ref 20–29)
Calcium: 9.2 mg/dL (ref 8.6–10.2)
Chloride: 103 mmol/L (ref 96–106)
Creatinine, Ser: 0.91 mg/dL (ref 0.76–1.27)
Globulin, Total: 2.7 g/dL (ref 1.5–4.5)
Glucose: 106 mg/dL — ABNORMAL HIGH (ref 70–99)
Potassium: 5.2 mmol/L (ref 3.5–5.2)
Sodium: 142 mmol/L (ref 134–144)
Total Protein: 6.8 g/dL (ref 6.0–8.5)
eGFR: 88 mL/min/{1.73_m2} (ref >59)

## 2021-03-30 LAB — CBC
Hematocrit: 46.4 % (ref 37.5–51.0)
Hemoglobin: 16 g/dL (ref 13.0–17.7)
MCH: 31.8 pg (ref 26.6–33.0)
MCHC: 34.5 g/dL (ref 31.5–35.7)
MCV: 92 fL (ref 79–97)
Platelets: 204 10*3/uL (ref 150–450)
RBC: 5.03 x10E6/uL (ref 4.14–5.80)
RDW: 12.4 % (ref 11.6–15.4)
WBC: 7 10*3/uL (ref 3.4–10.8)

## 2021-03-30 LAB — MAGNESIUM: Magnesium: 2.3 mg/dL (ref 1.6–2.3)

## 2021-03-30 LAB — TSH: TSH: 1.66 u[IU]/mL (ref 0.450–4.500)

## 2021-03-30 NOTE — Patient Instructions (Addendum)
Medication Instructions:  Your physician recommends that you continue on your current medications as directed. Please refer to the Current Medication list given to you today.  *If you need a refill on your cardiac medications before your next appointment, please call your pharmacy*   Lab Work: TODAY:  CMET, CBC, LIPID, TSH, MAG,  BEFORE THE CT IN June:  BMET    If you have labs (blood work) drawn today and your tests are completely normal, you will receive your results only by: [place] (if you have MyChart) OR A paper copy in the mail If you have any lab test that is abnormal or we need to change your treatment, we will call you to review the results.   Testing/Procedures: You will need to have a CTA Aorta, in June 2023.  Non-Cardiac CT Angiography (CTA), is a special type of CT scan that uses a computer to produce multi-dimensional views of major blood vessels throughout the body. In CT angiography, a contrast material is injected through an IV to help visualize the blood vessels    Follow-Up: At [facility], you and your health needs are our priority.  As part of our continuing mission to provide you with exceptional heart care, we have created designated Provider Care Teams.  These Care Teams include your primary Cardiologist (physician) and Advanced Practice Providers (APPs -  Physician Assistants and Nurse Practitioners) who all work together to provide you with the care you need, when you need it.  We recommend signing up for the patient portal called "MyChart".  Sign up information is provided on this After Visit Summary.  MyChart is used to connect with patients for Virtual Visits (Telemedicine).  Patients are able to view lab/test results, encounter notes, upcoming appointments, etc.  Non-urgent messages can be sent to your provider as well.   To learn more about what you can do with MyChart, go to [email or web address].    Your next appointment:   12 month(s)  The  format for your next appointment:   In Person  Provider:   Fransico Him, MD     Other Instructions

## 2021-03-31 NOTE — Progress Notes (Signed)
Pt has been made aware of normal result and verbalized understanding.  jw

## 2021-04-08 ENCOUNTER — Ambulatory Visit: Payer: PPO

## 2021-04-09 ENCOUNTER — Ambulatory Visit: Payer: PPO | Attending: Internal Medicine

## 2021-04-09 ENCOUNTER — Other Ambulatory Visit (HOSPITAL_BASED_OUTPATIENT_CLINIC_OR_DEPARTMENT_OTHER): Payer: Self-pay

## 2021-04-09 ENCOUNTER — Other Ambulatory Visit: Payer: Self-pay

## 2021-04-09 DIAGNOSIS — Z23 Encounter for immunization: Secondary | ICD-10-CM

## 2021-04-09 MED ORDER — PFIZER COVID-19 VAC BIVALENT 30 MCG/0.3ML IM SUSP
INTRAMUSCULAR | 0 refills | Status: DC
  Filled 2021-04-09: qty 0.3, 1d supply, fill #0

## 2021-04-09 NOTE — Progress Notes (Signed)
   Covid-19 Vaccination Clinic  Name:  Clarence Andrade    MRN: [id number] DOB: May 26, 1945  04/09/2021  [name] was observed post Covid-19 immunization for 15 minutes without incident. He was provided with Vaccine Information Sheet and instruction to access the V-Safe system.   [name] was instructed to call 911 with any severe reactions post vaccine: Difficulty breathing  Swelling of face and throat  A fast heartbeat  A bad rash all over body  Dizziness and weakness   Immunizations Administered     Name Date Dose VIS Date Route   Pfizer Covid-19 Vaccine Bivalent Booster 04/09/2021  2:41 PM 0.3 mL 01/06/2021 Intramuscular   Manufacturer: [place]   Lot: [id number]   [place]: [phone]

## 2021-04-20 DIAGNOSIS — I350 Nonrheumatic aortic (valve) stenosis: Secondary | ICD-10-CM | POA: Diagnosis not present

## 2021-04-20 DIAGNOSIS — I10 Essential (primary) hypertension: Secondary | ICD-10-CM | POA: Diagnosis not present

## 2021-04-20 DIAGNOSIS — G4733 Obstructive sleep apnea (adult) (pediatric): Secondary | ICD-10-CM | POA: Diagnosis not present

## 2021-04-20 DIAGNOSIS — I209 Angina pectoris, unspecified: Secondary | ICD-10-CM | POA: Diagnosis not present

## 2021-04-20 DIAGNOSIS — Z1389 Encounter for screening for other disorder: Secondary | ICD-10-CM | POA: Diagnosis not present

## 2021-04-20 DIAGNOSIS — I714 Abdominal aortic aneurysm, without rupture, unspecified: Secondary | ICD-10-CM | POA: Diagnosis not present

## 2021-04-20 DIAGNOSIS — Z0000 Encounter for general adult medical examination without abnormal findings: Secondary | ICD-10-CM | POA: Diagnosis not present

## 2021-04-20 DIAGNOSIS — D696 Thrombocytopenia, unspecified: Secondary | ICD-10-CM | POA: Diagnosis not present

## 2021-04-20 DIAGNOSIS — K862 Cyst of pancreas: Secondary | ICD-10-CM | POA: Diagnosis not present

## 2021-04-20 DIAGNOSIS — E7800 Pure hypercholesterolemia, unspecified: Secondary | ICD-10-CM | POA: Diagnosis not present

## 2021-04-20 DIAGNOSIS — K760 Fatty (change of) liver, not elsewhere classified: Secondary | ICD-10-CM | POA: Diagnosis not present

## 2021-05-06 ENCOUNTER — Ambulatory Visit (INDEPENDENT_AMBULATORY_CARE_PROVIDER_SITE_OTHER): Payer: PPO

## 2021-05-06 DIAGNOSIS — I442 Atrioventricular block, complete: Secondary | ICD-10-CM | POA: Diagnosis not present

## 2021-05-06 LAB — CUP PACEART REMOTE DEVICE CHECK
Battery Remaining Longevity: 147 mo
Battery Voltage: 3.03 V
Brady Statistic AP VP Percent: 1.55 %
Brady Statistic AP VS Percent: 32.75 %
Brady Statistic AS VP Percent: 0.52 %
Brady Statistic AS VS Percent: 65.18 %
Brady Statistic RA Percent Paced: 34.34 %
Brady Statistic RV Percent Paced: 2.07 %
Date Time Interrogation Session: 20221229020405
Implantable Lead Implant Date: 20200702
Implantable Lead Implant Date: 20200702
Implantable Lead Location: 753859
Implantable Lead Location: 753860
Implantable Lead Model: 5076
Implantable Lead Model: 5076
Implantable Pulse Generator Implant Date: 20200702
Lead Channel Impedance Value: 342 Ohm
Lead Channel Impedance Value: 342 Ohm
Lead Channel Impedance Value: 399 Ohm
Lead Channel Impedance Value: 437 Ohm
Lead Channel Pacing Threshold Amplitude: 0.5 V
Lead Channel Pacing Threshold Amplitude: 0.875 V
Lead Channel Pacing Threshold Pulse Width: 0.4 ms
Lead Channel Pacing Threshold Pulse Width: 0.4 ms
Lead Channel Sensing Intrinsic Amplitude: 4.375 mV
Lead Channel Sensing Intrinsic Amplitude: 4.375 mV
Lead Channel Sensing Intrinsic Amplitude: 6.75 mV
Lead Channel Sensing Intrinsic Amplitude: 6.75 mV
Lead Channel Setting Pacing Amplitude: 1.5 V
Lead Channel Setting Pacing Amplitude: 2 V
Lead Channel Setting Pacing Pulse Width: 0.4 ms
Lead Channel Setting Sensing Sensitivity: 2.8 mV

## 2021-05-18 NOTE — Progress Notes (Signed)
Remote pacemaker transmission.   

## 2021-08-05 ENCOUNTER — Ambulatory Visit (INDEPENDENT_AMBULATORY_CARE_PROVIDER_SITE_OTHER): Payer: PPO

## 2021-08-05 DIAGNOSIS — I442 Atrioventricular block, complete: Secondary | ICD-10-CM

## 2021-08-05 LAB — CUP PACEART REMOTE DEVICE CHECK
Battery Remaining Longevity: 143 mo
Battery Voltage: 3.03 V
Brady Statistic AP VP Percent: 2.05 %
Brady Statistic AP VS Percent: 28.61 %
Brady Statistic AS VP Percent: 0.76 %
Brady Statistic AS VS Percent: 68.58 %
Brady Statistic RA Percent Paced: 30.69 %
Brady Statistic RV Percent Paced: 2.82 %
Date Time Interrogation Session: 20230330015900
Implantable Lead Implant Date: 20200702
Implantable Lead Implant Date: 20200702
Implantable Lead Location: 753859
Implantable Lead Location: 753860
Implantable Lead Model: 5076
Implantable Lead Model: 5076
Implantable Pulse Generator Implant Date: 20200702
Lead Channel Impedance Value: 342 Ohm
Lead Channel Impedance Value: 342 Ohm
Lead Channel Impedance Value: 399 Ohm
Lead Channel Impedance Value: 437 Ohm
Lead Channel Pacing Threshold Amplitude: 0.5 V
Lead Channel Pacing Threshold Amplitude: 0.75 V
Lead Channel Pacing Threshold Pulse Width: 0.4 ms
Lead Channel Pacing Threshold Pulse Width: 0.4 ms
Lead Channel Sensing Intrinsic Amplitude: 3.75 mV
Lead Channel Sensing Intrinsic Amplitude: 3.75 mV
Lead Channel Sensing Intrinsic Amplitude: 6.625 mV
Lead Channel Sensing Intrinsic Amplitude: 6.625 mV
Lead Channel Setting Pacing Amplitude: 1.5 V
Lead Channel Setting Pacing Amplitude: 2 V
Lead Channel Setting Pacing Pulse Width: 0.4 ms
Lead Channel Setting Sensing Sensitivity: 2.8 mV

## 2021-08-17 NOTE — Progress Notes (Signed)
Remote pacemaker transmission.   

## 2021-10-13 ENCOUNTER — Other Ambulatory Visit: Payer: PPO

## 2021-10-13 DIAGNOSIS — I350 Nonrheumatic aortic (valve) stenosis: Secondary | ICD-10-CM | POA: Diagnosis not present

## 2021-10-13 DIAGNOSIS — I7121 Aneurysm of the ascending aorta, without rupture: Secondary | ICD-10-CM

## 2021-10-13 DIAGNOSIS — I4729 Other ventricular tachycardia: Secondary | ICD-10-CM | POA: Diagnosis not present

## 2021-10-13 DIAGNOSIS — I2510 Atherosclerotic heart disease of native coronary artery without angina pectoris: Secondary | ICD-10-CM

## 2021-10-13 DIAGNOSIS — E785 Hyperlipidemia, unspecified: Secondary | ICD-10-CM | POA: Diagnosis not present

## 2021-10-13 LAB — BASIC METABOLIC PANEL
BUN/Creatinine Ratio: 21 (ref 10–24)
BUN: 20 mg/dL (ref 8–27)
CO2: 34 mmol/L — ABNORMAL HIGH (ref 20–29)
Calcium: 9.3 mg/dL (ref 8.6–10.2)
Chloride: 103 mmol/L (ref 96–106)
Creatinine, Ser: 0.96 mg/dL (ref 0.76–1.27)
Glucose: 96 mg/dL (ref 70–99)
Potassium: 4.7 mmol/L (ref 3.5–5.2)
Sodium: 140 mmol/L (ref 134–144)
eGFR: 82 mL/min/{1.73_m2} (ref >59)

## 2021-10-20 ENCOUNTER — Ambulatory Visit (INDEPENDENT_AMBULATORY_CARE_PROVIDER_SITE_OTHER)
Admission: RE | Admit: 2021-10-20 | Discharge: 2021-10-20 | Disposition: A | Discharge status: Home / Self Care | Payer: PPO | Source: Ambulatory Visit | Attending: Physician Assistant | Admitting: Physician Assistant

## 2021-10-20 DIAGNOSIS — I350 Nonrheumatic aortic (valve) stenosis: Secondary | ICD-10-CM

## 2021-10-20 DIAGNOSIS — E785 Hyperlipidemia, unspecified: Secondary | ICD-10-CM

## 2021-10-20 DIAGNOSIS — I712 Thoracic aortic aneurysm, without rupture, unspecified: Secondary | ICD-10-CM | POA: Diagnosis not present

## 2021-10-20 DIAGNOSIS — I4729 Other ventricular tachycardia: Secondary | ICD-10-CM

## 2021-10-20 DIAGNOSIS — I2510 Atherosclerotic heart disease of native coronary artery without angina pectoris: Secondary | ICD-10-CM

## 2021-10-20 DIAGNOSIS — I7121 Aneurysm of the ascending aorta, without rupture: Secondary | ICD-10-CM | POA: Diagnosis not present

## 2021-10-20 DIAGNOSIS — I77810 Thoracic aortic ectasia: Secondary | ICD-10-CM | POA: Diagnosis not present

## 2021-10-20 MED ORDER — IOHEXOL 350 MG/ML SOLN
100.0000 mL | Freq: Once | INTRAVENOUS | Status: AC | PRN
  Administered 2021-10-20: 100 mL via INTRAVENOUS

## 2021-11-03 DIAGNOSIS — I10 Essential (primary) hypertension: Secondary | ICD-10-CM | POA: Diagnosis not present

## 2021-11-03 DIAGNOSIS — J309 Allergic rhinitis, unspecified: Secondary | ICD-10-CM | POA: Diagnosis not present

## 2021-11-03 DIAGNOSIS — Z79899 Other long term (current) drug therapy: Secondary | ICD-10-CM | POA: Diagnosis not present

## 2021-11-03 DIAGNOSIS — M67442 Ganglion, left hand: Secondary | ICD-10-CM | POA: Diagnosis not present

## 2021-11-03 DIAGNOSIS — I714 Abdominal aortic aneurysm, without rupture, unspecified: Secondary | ICD-10-CM | POA: Diagnosis not present

## 2021-11-03 DIAGNOSIS — I700 Atherosclerosis of aorta: Secondary | ICD-10-CM | POA: Diagnosis not present

## 2021-11-04 ENCOUNTER — Ambulatory Visit (INDEPENDENT_AMBULATORY_CARE_PROVIDER_SITE_OTHER): Payer: PPO

## 2021-11-04 DIAGNOSIS — I442 Atrioventricular block, complete: Secondary | ICD-10-CM

## 2021-11-04 LAB — CUP PACEART REMOTE DEVICE CHECK
Battery Remaining Longevity: 140 mo
Battery Voltage: 3.02 V
Brady Statistic AP VP Percent: 4.18 %
Brady Statistic AP VS Percent: 30.82 %
Brady Statistic AS VP Percent: 2.57 %
Brady Statistic AS VS Percent: 62.43 %
Brady Statistic RA Percent Paced: 35.06 %
Brady Statistic RV Percent Paced: 6.75 %
Date Time Interrogation Session: 20230628223927
Implantable Lead Implant Date: 20200702
Implantable Lead Implant Date: 20200702
Implantable Lead Location: 753859
Implantable Lead Location: 753860
Implantable Lead Model: 5076
Implantable Lead Model: 5076
Implantable Pulse Generator Implant Date: 20200702
Lead Channel Impedance Value: 342 Ohm
Lead Channel Impedance Value: 361 Ohm
Lead Channel Impedance Value: 399 Ohm
Lead Channel Impedance Value: 456 Ohm
Lead Channel Pacing Threshold Amplitude: 0.625 V
Lead Channel Pacing Threshold Amplitude: 0.875 V
Lead Channel Pacing Threshold Pulse Width: 0.4 ms
Lead Channel Pacing Threshold Pulse Width: 0.4 ms
Lead Channel Sensing Intrinsic Amplitude: 3.5 mV
Lead Channel Sensing Intrinsic Amplitude: 3.5 mV
Lead Channel Sensing Intrinsic Amplitude: 6.375 mV
Lead Channel Sensing Intrinsic Amplitude: 6.375 mV
Lead Channel Setting Pacing Amplitude: 1.5 V
Lead Channel Setting Pacing Amplitude: 2 V
Lead Channel Setting Pacing Pulse Width: 0.4 ms
Lead Channel Setting Sensing Sensitivity: 2.8 mV

## 2021-11-18 DIAGNOSIS — M79645 Pain in left finger(s): Secondary | ICD-10-CM | POA: Diagnosis not present

## 2021-11-19 NOTE — Progress Notes (Signed)
Remote pacemaker transmission.   

## 2021-12-01 DIAGNOSIS — M25742 Osteophyte, left hand: Secondary | ICD-10-CM | POA: Diagnosis not present

## 2021-12-01 DIAGNOSIS — M19042 Primary osteoarthritis, left hand: Secondary | ICD-10-CM | POA: Diagnosis not present

## 2021-12-01 DIAGNOSIS — L729 Follicular cyst of the skin and subcutaneous tissue, unspecified: Secondary | ICD-10-CM | POA: Diagnosis not present

## 2021-12-01 DIAGNOSIS — M71342 Other bursal cyst, left hand: Secondary | ICD-10-CM | POA: Diagnosis not present

## 2021-12-01 DIAGNOSIS — M67442 Ganglion, left hand: Secondary | ICD-10-CM | POA: Diagnosis not present

## 2021-12-21 DIAGNOSIS — R599 Enlarged lymph nodes, unspecified: Secondary | ICD-10-CM | POA: Diagnosis not present

## 2021-12-21 DIAGNOSIS — R051 Acute cough: Secondary | ICD-10-CM | POA: Diagnosis not present

## 2022-01-12 DIAGNOSIS — H353131 Nonexudative age-related macular degeneration, bilateral, early dry stage: Secondary | ICD-10-CM | POA: Diagnosis not present

## 2022-01-12 DIAGNOSIS — H5213 Myopia, bilateral: Secondary | ICD-10-CM | POA: Diagnosis not present

## 2022-01-12 DIAGNOSIS — H2513 Age-related nuclear cataract, bilateral: Secondary | ICD-10-CM | POA: Diagnosis not present

## 2022-01-18 DIAGNOSIS — R221 Localized swelling, mass and lump, neck: Secondary | ICD-10-CM | POA: Diagnosis not present

## 2022-01-18 DIAGNOSIS — I10 Essential (primary) hypertension: Secondary | ICD-10-CM | POA: Diagnosis not present

## 2022-01-21 ENCOUNTER — Other Ambulatory Visit (HOSPITAL_BASED_OUTPATIENT_CLINIC_OR_DEPARTMENT_OTHER): Payer: Self-pay

## 2022-01-21 MED ORDER — AREXVY 120 MCG/0.5ML IM SUSR
INTRAMUSCULAR | 0 refills | Status: DC
  Filled 2022-01-21: qty 0.5, 1d supply, fill #0

## 2022-01-26 ENCOUNTER — Other Ambulatory Visit: Payer: Self-pay | Admitting: Otolaryngology

## 2022-01-26 ENCOUNTER — Other Ambulatory Visit (HOSPITAL_COMMUNITY): Payer: Self-pay | Admitting: Otolaryngology

## 2022-01-26 ENCOUNTER — Other Ambulatory Visit (HOSPITAL_BASED_OUTPATIENT_CLINIC_OR_DEPARTMENT_OTHER): Payer: Self-pay | Admitting: Otolaryngology

## 2022-01-26 DIAGNOSIS — C109 Malignant neoplasm of oropharynx, unspecified: Secondary | ICD-10-CM | POA: Diagnosis not present

## 2022-01-26 DIAGNOSIS — I272 Pulmonary hypertension, unspecified: Secondary | ICD-10-CM | POA: Diagnosis not present

## 2022-01-26 DIAGNOSIS — I2510 Atherosclerotic heart disease of native coronary artery without angina pectoris: Secondary | ICD-10-CM | POA: Diagnosis not present

## 2022-01-26 DIAGNOSIS — C770 Secondary and unspecified malignant neoplasm of lymph nodes of head, face and neck: Secondary | ICD-10-CM | POA: Insufficient documentation

## 2022-01-26 DIAGNOSIS — I350 Nonrheumatic aortic (valve) stenosis: Secondary | ICD-10-CM | POA: Diagnosis not present

## 2022-01-28 ENCOUNTER — Other Ambulatory Visit (HOSPITAL_BASED_OUTPATIENT_CLINIC_OR_DEPARTMENT_OTHER): Payer: Self-pay

## 2022-01-28 MED ORDER — INFLUENZA VAC A&B SA ADJ QUAD 0.5 ML IM PRSY
PREFILLED_SYRINGE | INTRAMUSCULAR | 0 refills | Status: DC
  Filled 2022-01-28: qty 0.5, 1d supply, fill #0

## 2022-02-02 ENCOUNTER — Ambulatory Visit (HOSPITAL_COMMUNITY)
Admission: RE | Admit: 2022-02-02 | Discharge: 2022-02-02 | Disposition: A | Discharge status: Home / Self Care | Payer: PPO | Source: Ambulatory Visit | Attending: Otolaryngology | Admitting: Otolaryngology

## 2022-02-02 DIAGNOSIS — C109 Malignant neoplasm of oropharynx, unspecified: Secondary | ICD-10-CM | POA: Insufficient documentation

## 2022-02-02 DIAGNOSIS — C770 Secondary and unspecified malignant neoplasm of lymph nodes of head, face and neck: Secondary | ICD-10-CM | POA: Diagnosis not present

## 2022-02-02 MED ORDER — IOHEXOL 300 MG/ML  SOLN
75.0000 mL | Freq: Once | INTRAMUSCULAR | Status: AC | PRN
  Administered 2022-02-02: 75 mL via INTRAVENOUS

## 2022-02-03 ENCOUNTER — Other Ambulatory Visit (HOSPITAL_COMMUNITY): Payer: Self-pay | Admitting: Otolaryngology

## 2022-02-03 ENCOUNTER — Ambulatory Visit (INDEPENDENT_AMBULATORY_CARE_PROVIDER_SITE_OTHER): Payer: PPO

## 2022-02-03 DIAGNOSIS — I442 Atrioventricular block, complete: Secondary | ICD-10-CM | POA: Diagnosis not present

## 2022-02-03 DIAGNOSIS — C109 Malignant neoplasm of oropharynx, unspecified: Secondary | ICD-10-CM

## 2022-02-03 LAB — CUP PACEART REMOTE DEVICE CHECK
Battery Remaining Longevity: 137 mo
Battery Voltage: 3.02 V
Brady Statistic AP VP Percent: 5.71 %
Brady Statistic AP VS Percent: 25.95 %
Brady Statistic AS VP Percent: 3.11 %
Brady Statistic AS VS Percent: 65.24 %
Brady Statistic RA Percent Paced: 31.65 %
Brady Statistic RV Percent Paced: 8.82 %
Date Time Interrogation Session: 20230927221109
Implantable Lead Implant Date: 20200702
Implantable Lead Implant Date: 20200702
Implantable Lead Location: 753859
Implantable Lead Location: 753860
Implantable Lead Model: 5076
Implantable Lead Model: 5076
Implantable Pulse Generator Implant Date: 20200702
Lead Channel Impedance Value: 323 Ohm
Lead Channel Impedance Value: 323 Ohm
Lead Channel Impedance Value: 380 Ohm
Lead Channel Impedance Value: 418 Ohm
Lead Channel Pacing Threshold Amplitude: 0.5 V
Lead Channel Pacing Threshold Amplitude: 0.875 V
Lead Channel Pacing Threshold Pulse Width: 0.4 ms
Lead Channel Pacing Threshold Pulse Width: 0.4 ms
Lead Channel Sensing Intrinsic Amplitude: 2.625 mV
Lead Channel Sensing Intrinsic Amplitude: 2.625 mV
Lead Channel Sensing Intrinsic Amplitude: 6.375 mV
Lead Channel Sensing Intrinsic Amplitude: 6.375 mV
Lead Channel Setting Pacing Amplitude: 1.5 V
Lead Channel Setting Pacing Amplitude: 2 V
Lead Channel Setting Pacing Pulse Width: 0.4 ms
Lead Channel Setting Sensing Sensitivity: 2.8 mV

## 2022-02-04 ENCOUNTER — Encounter: Payer: Self-pay | Admitting: *Deleted

## 2022-02-04 NOTE — Progress Notes (Unsigned)
Arne Cleveland, MD  [place], Lajean Boese M Ok   [place] core L neck mass    DDH

## 2022-02-07 ENCOUNTER — Ambulatory Visit (HOSPITAL_COMMUNITY): Payer: PPO

## 2022-02-07 ENCOUNTER — Other Ambulatory Visit (HOSPITAL_COMMUNITY): Payer: Self-pay | Admitting: Otolaryngology

## 2022-02-07 ENCOUNTER — Telehealth: Payer: Self-pay | Admitting: Hematology and Oncology

## 2022-02-07 ENCOUNTER — Telehealth: Payer: Self-pay | Admitting: Radiation Oncology

## 2022-02-07 ENCOUNTER — Other Ambulatory Visit: Payer: Self-pay | Admitting: Otolaryngology

## 2022-02-07 DIAGNOSIS — C109 Malignant neoplasm of oropharynx, unspecified: Secondary | ICD-10-CM

## 2022-02-07 NOTE — Telephone Encounter (Signed)
Scheduled appointment per 10/02. Patient is aware of appointment date and time. Patient is aware to arrive 15 mins prior to appointment time and to bring updated insurance cards. Patient is aware of location.   

## 2022-02-07 NOTE — Telephone Encounter (Signed)
Called patient to schedule a consultation w. Dr. Squire. No answer, LVM for a return call.  

## 2022-02-08 ENCOUNTER — Telehealth: Payer: Self-pay | Admitting: Radiation Oncology

## 2022-02-08 NOTE — Telephone Encounter (Signed)
Called patient to schedule a consultation w. Dr. Squire. No answer, LVM for a return call.  

## 2022-02-09 ENCOUNTER — Telehealth: Payer: Self-pay | Admitting: Radiation Oncology

## 2022-02-09 ENCOUNTER — Other Ambulatory Visit: Payer: Self-pay | Admitting: Student

## 2022-02-09 DIAGNOSIS — R591 Generalized enlarged lymph nodes: Secondary | ICD-10-CM

## 2022-02-09 NOTE — Telephone Encounter (Signed)
Called patient and patient's wife to schedule for a consultation w. Dr. Isidore Moos. No answer, LVM for a return call.

## 2022-02-09 NOTE — Progress Notes (Signed)
Remote pacemaker transmission.   

## 2022-02-10 ENCOUNTER — Other Ambulatory Visit: Payer: Self-pay

## 2022-02-10 ENCOUNTER — Encounter (HOSPITAL_COMMUNITY): Payer: Self-pay

## 2022-02-10 ENCOUNTER — Ambulatory Visit (HOSPITAL_COMMUNITY)
Admission: RE | Admit: 2022-02-10 | Discharge: 2022-02-10 | Disposition: A | Discharge status: Home / Self Care | Payer: PPO | Source: Ambulatory Visit

## 2022-02-10 ENCOUNTER — Ambulatory Visit (HOSPITAL_COMMUNITY)
Admission: RE | Admit: 2022-02-10 | Discharge: 2022-02-10 | Disposition: A | Discharge status: Home / Self Care | Payer: PPO | Source: Ambulatory Visit | Attending: Otolaryngology | Admitting: Otolaryngology

## 2022-02-10 DIAGNOSIS — C109 Malignant neoplasm of oropharynx, unspecified: Secondary | ICD-10-CM | POA: Insufficient documentation

## 2022-02-10 DIAGNOSIS — I7121 Aneurysm of the ascending aorta, without rupture: Secondary | ICD-10-CM | POA: Diagnosis not present

## 2022-02-10 DIAGNOSIS — I2510 Atherosclerotic heart disease of native coronary artery without angina pectoris: Secondary | ICD-10-CM | POA: Diagnosis not present

## 2022-02-10 DIAGNOSIS — I252 Old myocardial infarction: Secondary | ICD-10-CM | POA: Diagnosis not present

## 2022-02-10 DIAGNOSIS — R590 Localized enlarged lymph nodes: Secondary | ICD-10-CM | POA: Diagnosis not present

## 2022-02-10 DIAGNOSIS — Z85818 Personal history of malignant neoplasm of other sites of lip, oral cavity, and pharynx: Secondary | ICD-10-CM | POA: Diagnosis not present

## 2022-02-10 DIAGNOSIS — Z6837 Body mass index (BMI) 37.0-37.9, adult: Secondary | ICD-10-CM | POA: Diagnosis not present

## 2022-02-10 DIAGNOSIS — E785 Hyperlipidemia, unspecified: Secondary | ICD-10-CM | POA: Insufficient documentation

## 2022-02-10 DIAGNOSIS — I272 Pulmonary hypertension, unspecified: Secondary | ICD-10-CM | POA: Insufficient documentation

## 2022-02-10 DIAGNOSIS — R591 Generalized enlarged lymph nodes: Secondary | ICD-10-CM

## 2022-02-10 DIAGNOSIS — I350 Nonrheumatic aortic (valve) stenosis: Secondary | ICD-10-CM | POA: Insufficient documentation

## 2022-02-10 DIAGNOSIS — Z8679 Personal history of other diseases of the circulatory system: Secondary | ICD-10-CM | POA: Insufficient documentation

## 2022-02-10 DIAGNOSIS — R1310 Dysphagia, unspecified: Secondary | ICD-10-CM | POA: Insufficient documentation

## 2022-02-10 DIAGNOSIS — C801 Malignant (primary) neoplasm, unspecified: Secondary | ICD-10-CM | POA: Diagnosis not present

## 2022-02-10 DIAGNOSIS — C770 Secondary and unspecified malignant neoplasm of lymph nodes of head, face and neck: Secondary | ICD-10-CM | POA: Insufficient documentation

## 2022-02-10 MED ORDER — SODIUM CHLORIDE 0.9 % IV SOLN: INTRAVENOUS | Status: DC

## 2022-02-10 MED ORDER — LIDOCAINE HCL 1 % IJ SOLN
INTRAMUSCULAR | Status: AC
  Administered 2022-02-10: 5 mL
  Filled 2022-02-10: qty 20

## 2022-02-10 NOTE — Discharge Instructions (Addendum)
      Please call Interventional Radiology clinic [phone] with any questions or concerns.  You may remove your dressing and shower tomorrow.  Needle Biopsy, Care After These instructions tell you how to care for yourself after your procedure. Your doctor may also give you more specific instructions. Call your doctor if you have any problems or questions. What can I expect after the procedure? After the procedure, it is common to have: Soreness. Bruising. Mild pain. Follow these instructions at home:  Return to your normal activities as told by your doctor. Ask your doctor what activities are safe for you. Take over-the-counter and prescription medicines only as told by your doctor. Wash your hands with soap and water before you change your bandage (dressing). If you cannot use soap and water, use hand sanitizer. Follow instructions from your doctor about: How to take care of your puncture site. When and how to change your bandage. When to remove your bandage. Check your puncture site every day for signs of infection. Watch for: Redness, swelling, or pain. Fluid or blood.  Pus or a bad smell. Warmth. Do not take baths, swim, or use a hot tub until your doctor approves. Ask your doctor if you may take showers. You may only be allowed to take sponge baths. Keep all follow-up visits as told by your doctor. This is important. Contact a doctor if you have: A fever. Redness, swelling, or pain at the puncture site, and it lasts longer than a few days. Fluid, blood, or pus coming from the puncture site. Warmth coming from the puncture site. Get help right away if: You have a lot of bleeding from the puncture site. Summary After the procedure, it is common to have soreness, bruising, or mild pain at the puncture site. Check your puncture site every day for signs of infection, such as redness, swelling, or pain. Get help right away if you have severe bleeding from your puncture  site. This information is not intended to replace advice given to you by your health care provider. Make sure you discuss any questions you have with your health care provider. Document Revised: 05/08/2017 Document Reviewed: 05/08/2017 Elsevier Patient Education  2020 [employer].

## 2022-02-10 NOTE — Consult Note (Signed)
Chief Complaint: Patient was seen in consultation today for image guided left neck mass biopsy  Referring Physician(s): Hoshal,Steven  Supervising Physician: Mir, Sharen Heck  Patient Status: [facility] - Out-pt  History of Present Illness: Clarence Andrade is a 75 y.o. male with past medical history significant for aortic stenosis, ascending aortic aneurysm, coronary artery disease, complete heart block with pacemaker, hyperlipidemia, obesity, prior MI, sleep apnea, pulmonary hypertension who presents now with enlarging left neck mass of approximately 6-week duration along with intermittent left neck discomfort, otalgia ,dysphagia.  CT neck performed on 02/05/2022 revealed:   4 cm mainly exophytic mass centered at the left base of tongue with at least 1 ipsilateral malignant node measuring nearly 4 cm with signs of extracapsular extension. Findings consistent with metastatic squamous cell carcinoma.  He presents today for image guided left neck mass biopsy for further evaluation.   Past Medical History:  Diagnosis Date   Abdominal hernia    Midline   Adrenal incidentaloma ([place]) 2010   Aortic stenosis    Ascending aortic aneurysm ([place])    a. 4.4cm by imaging 03/2020.   CAD (coronary artery disease)    a. Cath 03/2018 - CTO of the prox-mid LAD with bridging, L-L and R-L collaterals, moderate, non-obstructive disease involving the proximal LAD, proximal LCx, and distal RCA, more severe disease noted involving small branch vessels (RV marginal and superior branch of OM3).   Complete heart block (HCC)    Gallstones    Heart murmur    History of pacemaker    a. 11/2018 - [employer], intermittent CHB.   Hyperlipidemia    Morbid obesity (HCC)    Myocardial infarction [facility])    NSVT (nonsustained ventricular tachycardia) (HCC)    OSA (obstructive sleep apnea)    Severe PSG 1/07 AHI 66/hr, O2 Nadir 50% Refuses treatment - Pt does not believe test was accurate   Pericarditis    Pneumonia     Pulmonary HTN (HCC)    mild with PASP 17mHg by echo 02/2018, not seen on 03/2020 echo   Pulmonary nodule    Screening for AAA (abdominal aortic aneurysm) 2010   CT Abd/Pelvis   Ventral hernia    03/2009    Past Surgical History:  Procedure Laterality Date   CHOLECYSTECTOMY N/A 10/11/2019   Procedure: LAPAROSCOPIC CHOLECYSTECTOMY WITH INTRAOPERATIVE CHOLANGIOGRAM;  Surgeon: GArmandina Gemma MD;  Location: [facility] ORS;  Service: General;  Laterality: N/A;   CORONARY ANGIOGRAPHY N/A 03/23/2018   Procedure: CORONARY ANGIOGRAPHY;  Surgeon: ENelva Bush MD;  [facility];  Service: Cardiovascular;  Laterality: N/A;   HERNIA REPAIR  05/2002   three   KNEE ARTHROSCOPY Left 09/2002   PACEMAKER IMPLANT N/A 11/08/2018   Procedure: PACEMAKER IMPLANT;  Surgeon: CConstance Haw MD;  [facility];  Service: Cardiovascular;  Laterality: N/A;   SHOULDER SURGERY Right 2020   TOTAL KNEE ARTHROPLASTY Bilateral 09/2010   VENTRAL HERNIA REPAIR  03/2009    Allergies: Sertraline hcl  Medications: Prior to Admission medications   Medication Sig Start Date End Date Taking? Authorizing Provider  acetaminophen (TYLENOL) 500 MG tablet Take 1,000 mg by mouth every 6 (six) hours as needed (pain).   Yes [provider]  buPROPion (WELLBUTRIN XL) 150 MG 24 hr tablet Take 150 mg by mouth every morning. 01/18/21  Yes [provider]  isosorbide mononitrate (IMDUR) 30 MG 24 hr tablet TAKE 1 TABLET ONCE DAILY. 03/01/21  Yes Dunn, Dayna N, PA-C  lisinopril (ZESTRIL)  5 MG tablet Take 5 mg by mouth at bedtime.    Yes [provider]  metoprolol succinate (TOPROL-XL) 25 MG 24 hr tablet Take 1 tablet (25 mg total) by mouth daily. 12/01/20  Yes Camnitz, Ocie Doyne, MD  Multiple Vitamin (MULTIVITAMIN) tablet Take 1 tablet by mouth daily.   Yes [provider]  rosuvastatin (CRESTOR) 10 MG tablet TAKE 1 TABLET ONCE DAILY. 02/10/21  Yes Dunn, Nedra Hai, PA-C   aspirin EC 81 MG tablet Take 81 mg by mouth daily.    [provider]  COVID-19 mRNA bivalent vaccine, Pfizer, (PFIZER COVID-19 VAC BIVALENT) injection Inject into the muscle. 04/09/21   Carlyle Basques, MD  COVID-19 mRNA Vac-TriS, Pfizer, (PFIZER-BIONT COVID-19 VAC-TRIS) SUSP injection Inject into the muscle. 10/27/20   Carlyle Basques, MD  influenza vaccine adjuvanted (FLUAD) 0.5 ML injection Inject into the muscle. 01/28/22   Carlyle Basques, MD  nitroGLYCERIN (NITROSTAT) 0.4 MG SL tablet Place 1 tablet (0.4 mg total) under the tongue every 5 (five) minutes as needed for chest pain. 03/19/18   Nahser, Wonda Cheng, MD  RSV vaccine recomb adjuvanted (AREXVY) 120 MCG/0.5ML injection Inject into the muscle. 01/21/22   Carlyle Basques, MD     Family History  Problem Relation Age of Onset   Heart attack Father    Heart disease Father    Aneurysm Father     Social History   Socioeconomic History   Marital status: Married    Spouse name: Not on file   Number of children: Not on file   Years of education: Not on file   Highest education level: Not on file  Occupational History   Not on file  Tobacco Use   Smoking status: Former   Smokeless tobacco: Never  Vaping Use   Vaping Use: Never used  Substance and Sexual Activity   Alcohol use: No   Drug use: No   Sexual activity: Not on file  Other Topics Concern   Not on file  Social History Narrative   Not on file   Social Determinants of Health   Financial Resource Strain: Not on file  Food Insecurity: Not on file  Transportation Needs: Not on file  Physical Activity: Not on file  Stress: Not on file  Social Connections: Not on file      Review of Systems currently denies fever, headache, chest pain, dyspnea, cough, abdominal pain, nausea, vomiting or bleeding.  He does have chronic back pain.  Vital Signs: BP 128/77   Pulse 66   Temp 98.6 F (37 C) (Oral)   Resp 16   Ht 6' (1.829 m)   Wt 274 lb (124.3 kg)   SpO2  94%   BMI 37.16 kg/m      Physical Exam awake, alert.  Chest clear to auscultation bilaterally.  Heart with regular rate and rhythm, positive murmur, left chest wall pacer.  Abdomen soft, positive bowel sounds, nontender.  No significant lower extremity edema.  Palpable left neck mass/adenopathy, mildly tender to palpation.  Imaging: CT SOFT TISSUE NECK W CONTRAST  Result Date: 02/05/2022 CLINICAL DATA:  Malignant neoplasm of oropharynx metastatic to lymph nodes of the head neck. EXAM: CT NECK WITH CONTRAST TECHNIQUE: Multidetector CT imaging of the neck was performed using the standard protocol following the bolus administration of intravenous contrast. RADIATION DOSE REDUCTION: This exam was performed according to the departmental dose-optimization program which includes automated exposure control, adjustment of the mA and/or kV according to patient size and/or use of  iterative reconstruction technique. CONTRAST:  57m OMNIPAQUE IOHEXOL 300 MG/ML  SOLN COMPARISON:  None Available. FINDINGS: Pharynx and larynx: Masslike thickening involving the mucosa of the left tongue base extending up towards the left glossotonsillar sulcus, 4 cm craniocaudal. Inferiorly, the mass crosses midline. The mass is exophytic primarily without measurable submucosal tumor. Salivary glands: No inflammation, mass, or stone. Thyroid: Unremarkable Lymph nodes: Heterogeneous left level 2 adenopathy with lobulated contours and indistinct margins suggesting extracapsular extension, 3.7 cm. Vascular: Atheromatous calcification that is scattered. Limited intracranial: Negative Visualized orbits: Negative Mastoids and visualized paranasal sinuses: Clear Skeleton: Spondylosis and degenerative cervical spine spurring. No destructive changes. Upper chest: Calcified nodule at the left apex. No noncalcified nodules. IMPRESSION: 4 cm mainly exophytic mass centered at the left base of tongue with at least 1 ipsilateral malignant node  measuring nearly 4 cm with signs of extracapsular extension. Findings consistent with metastatic squamous cell carcinoma. Electronically Signed   By: JJorje GuildM.D.   On: 02/05/2022 04:21   CUP PACEART REMOTE DEVICE CHECK  Result Date: 02/03/2022 Scheduled remote reviewed. Normal device function.  2 NSVT, 6 & 8 beats Next remote 91 days. LA   Labs:  CBC: Recent Labs    03/30/21 1119  WBC 7.0  HGB 16.0  HCT 46.4  PLT 204    COAGS: No results for input(s): "INR", "APTT" in the last 8760 hours.  BMP: Recent Labs    03/30/21 1119 10/13/21 0954  NA 142 140  K 5.2 4.7  CL 103 103  CO2 29 34*  GLUCOSE 106* 96  BUN 16 20  CALCIUM 9.2 9.3  CREATININE 0.91 0.96    LIVER FUNCTION TESTS: Recent Labs    03/30/21 1119  BILITOT 0.4  AST 20  ALT 18  ALKPHOS 73  PROT 6.8  ALBUMIN 4.1    TUMOR MARKERS: No results for input(s): "AFPTM", "CEA", "CA199", "CHROMGRNA" in the last 8760 hours.  Assessment and Plan: 76y.o. male with past medical history significant for aortic stenosis, ascending aortic aneurysm, coronary artery disease, complete heart block with pacemaker, hyperlipidemia, obesity, prior MI, sleep apnea, pulmonary hypertension who presents now with enlarging left neck mass of approximately 6-week duration along with intermittent left neck discomfort, otalgia ,dysphagia.  CT neck performed on 02/05/2022 revealed:   4 cm mainly exophytic mass centered at the left base of tongue with at least 1 ipsilateral malignant node measuring nearly 4 cm with signs of extracapsular extension. Findings consistent with metastatic squamous cell carcinoma.  He presents today for image guided left neck mass biopsy for further evaluation.Risks and benefits of procedure was discussed with the patient  including, but not limited to bleeding, infection, damage to adjacent structures or low yield requiring additional tests.  All of the questions were answered and there is agreement to  proceed.  Consent signed and in chart.    Thank you for this interesting consult.  I greatly enjoyed meeting Clarence DURNINand look forward to participating in their care.  A copy of this report was sent to the requesting provider on this date.  Electronically Signed: D. KRowe Nima PA-C 02/10/2022, 11:42 AM   I spent a total of  20 minutes   in face to face in clinical consultation, greater than 50% of which was counseling/coordinating care for image guided biopsy of left neck mass

## 2022-02-10 NOTE — Progress Notes (Signed)
Patient did not recv sedative medications in procedural area. Local numbing agent. Pt. Brought back from ultrasound immediately by technicians. Please refer to Dr. Aurea Graff note for further details.  Discharge instructions provided to patient and wife. All questions answered.

## 2022-02-10 NOTE — Procedures (Signed)
Interventional Radiology Procedure Note  Procedure: US guided left neck lymph node biopsy  Indication: Enlarged left neck lymph node  Findings: Please refer to procedural dictation for full description.  Complications: None  EBL: < 10 mL  Miachel Roux, MD [phone]

## 2022-02-14 LAB — SURGICAL PATHOLOGY

## 2022-02-14 NOTE — Progress Notes (Signed)
Oncology Nurse Navigator Documentation   Placed introductory call to new referral patient Clarence Andrade. Introduced myself as the H&N oncology nurse navigator that works with Dr. Isidore Moos and Dr. Chryl Heck to whom he has been referred by Dr. Sabino Gasser. He confirmed understanding of referral. Briefly explained my role as his navigator, provided my contact information.  Confirmed understanding of upcoming appts and [facility] location, explained arrival and registration process. I explained the purpose of a dental evaluation prior to starting RT, indicated he would be contacted by [facility] to arrange an appt.   I encouraged him to call with questions/concerns as he moves forward with appts and procedures.   He verbalized understanding of information provided, expressed appreciation for my call.   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  Currently on FMLA / STD: na Living Situation: he lives with his wife. Support System: wife, family PCP:Hal Stoneking MD PCD: Financial Concerns:not at this time Transportation Needs: no Sensory Deficits: no Engineer, building services Needed:  no Ambulation Needs: no DME Used in Home: no Psychosocial Needs:  no Concerns/Needs Understanding Cancer:  addressed/answered by navigator to best of ability Self-Expressed Needs: no   Clinical biochemist, BSN, OCN Head & Neck Oncology Nurse [place] at [facility] Phone # [phone]  Fax # [phone]

## 2022-02-15 NOTE — Progress Notes (Signed)
Head and Neck Cancer Location of Tumor / Histology:  neoplasm of oropharynx metastatic to lymph node of head and neck region, p(16+)  Patient presented with symptoms of: (from Dr. Alona Bene 01/26/22 office note) "chief complaint of a lump on his neck present for the past 1 to 2 months. He thinks is all began in March with worsening nasal allergies which developed into a left-sided ear ache in July. Around July the neck lump "popped up". He was treated with amoxicillin for 10 days by his primary care physician and as well as another week of penicillin by his dentist for dental procedure with no impact on the neck mass. He states his throat feels tight. Endorses difficulty swallowing solids, liquids, pills."  CT Neck w/ Contrast 02/02/2022 --IMPRESSION: 4 cm mainly exophytic mass centered at the left base of tongue with at least 1 ipsilateral malignant node measuring nearly 4 cm with signs of extracapsular extension. Findings consistent with metastatic squamous cell carcinoma.  Biopsies revealed:  02/10/2022 A. LYMPH NODE, LEFT NECK, NEEDLE CORE BIOPSY:  - Moderately differentiated squamous cell carcinoma, see comment  COMMENT:  - Tumor cells are positive for CK5/6 immunostain, consistent with above  interpretation.  Immunostain for p16 is also diffusely positive.  - Lymphoid tissue is not identified - findings likely represent an  entirely replaced lymph node.   Nutrition Status Yes No Comments  Weight changes? '[x]'$  '[]'$  ~20lb unintentional weight loss  Swallowing concerns? '[x]'$  '[]'$  Reports some difficulty with solid foods and liquids   PEG? '[]'$  '[x]'$     Referrals Yes No Comments  Social Work? '[x]'$  '[]'$    Dentistry? '[x]'$  '[]'$    Swallowing therapy? '[x]'$  '[]'$    Nutrition? '[x]'$  '[]'$    Med/Onc? '[x]'$  '[]'$  Dr. Arletha Pili Iruku   Safety Issues Yes No Comments  Prior radiation? '[]'$  '[x]'$    Pacemaker/ICD? '[x]'$  '[]'$  Has not been seen by cardiologist since 11/2020. He is scheduled to see Dr. Curt Bears 02/22/2022 to address    Possible current pregnancy? '[]'$  '[x]'$  N/A  Is the patient on methotrexate? '[]'$  '[x]'$     Tobacco/Marijuana/Snuff/ETOH use: Patient quit smoking ~30 years ago (states he never smoked more than 0.5 pack/day). Denies alcohol consumption or recreational drug use.  Past/Anticipated interventions by otolaryngology, if any:  02/10/2022 CT guided left neck mass biopsy  01/26/2022 --Dr. Jenetta Downer (office visit)  RECOMMENDATIONS: I counseled the patient on my suspicion regarding diagnosis. I recommended urgent CT of the neck with contrast and ultrasound-guided FNA with p16 testing to clarify diagnosis. I discussed surgical biopsy i.e. direct laryngoscopy with biopsy however given medical comorbidities I recommend starting with ultrasound-guided FNA of the neck. Pending confirmatory diagnosis of squamous cell carcinoma we will then order urgent PET CT and place referrals to radiation oncology, medical oncology and present the case at the: Multidisciplinary tumor board. I discussed with the patient that I do not believe his disease is going to be surgical in nature due to the likely extent of tongue base resection (Crossing midline) involved and the clinical behavior of his left neck lymph node (suspect extracapsular extension due to fixed nature). Given this I do not believe that a referral to [facility] for head and neck surgical evaluation is necessary at this time. I did offer this and the patient is interested in remaining in [place] for further treatment. Final decision on treatment pending tumor board discussion.   Past/Anticipated interventions by medical oncology, if any:  Under care of Dr. Arletha Pili Iruku 02/17/2022 Patient arrived to the appointment today with  his wife.   He tells me that he was in good shape although he had cardiac disease, may have had a heart attack many years ago, currently has a pacemaker in place.   He is also on multiple blood pressure medications and was very dizzy and  hypotensive today at the time of my visit.   He has noticed some difficulty swallowing and has also been complaining of dysgeusia, he may have lost about 20 pounds of weight in the past couple months, not sure if some of this is intentional.   Physical examination today and, noted visible and palpable left-sided cervical lymphadenopathy measuring about 4 cm or larger.   We have discussed that this is an early stage a oropharyngeal cancer and the intent of treatment is curable.   Given his lymph node greater than 3 cm, ideal treatment would be to consider combination of chemotherapy and radiation.   However he understands that this is mostly if his PET/CT is without any evidence of distant metastatic disease.   We have discussed about the regimen which is likely chemotherapy cisplatin 40 mg per metered square.   Were discussed about mechanism of action, adverse effects of chemotherapy  He understands that he will need a G-tube placement and port for chemotherapy administration.   He tells me that he is pretty good for his age, does water aerobics 5 times a week tries to stay active and would really like to do what ever treatment is considered best for him.   We can attempt concurrent chemoradiation and if he has difficulty tolerating it, we have discussed about dropping chemo and proceeding with radiation alone.   All his questions were answered to the best my knowledge.   Current Complaints / other details:  Scheduled for PET scan later this morning

## 2022-02-17 ENCOUNTER — Other Ambulatory Visit: Payer: Self-pay

## 2022-02-17 ENCOUNTER — Encounter: Payer: Self-pay | Admitting: Hematology and Oncology

## 2022-02-17 ENCOUNTER — Inpatient Hospital Stay (HOSPITAL_BASED_OUTPATIENT_CLINIC_OR_DEPARTMENT_OTHER): Payer: PPO | Admitting: Hematology and Oncology

## 2022-02-17 ENCOUNTER — Encounter (HOSPITAL_BASED_OUTPATIENT_CLINIC_OR_DEPARTMENT_OTHER): Payer: Self-pay | Admitting: Cardiology

## 2022-02-17 ENCOUNTER — Inpatient Hospital Stay: Payer: PPO | Attending: Hematology and Oncology

## 2022-02-17 ENCOUNTER — Encounter: Payer: Self-pay | Admitting: *Deleted

## 2022-02-17 DIAGNOSIS — C760 Malignant neoplasm of head, face and neck: Secondary | ICD-10-CM

## 2022-02-17 DIAGNOSIS — I2510 Atherosclerotic heart disease of native coronary artery without angina pectoris: Secondary | ICD-10-CM | POA: Insufficient documentation

## 2022-02-17 DIAGNOSIS — R1310 Dysphagia, unspecified: Secondary | ICD-10-CM | POA: Diagnosis not present

## 2022-02-17 DIAGNOSIS — Z87891 Personal history of nicotine dependence: Secondary | ICD-10-CM

## 2022-02-17 DIAGNOSIS — C109 Malignant neoplasm of oropharynx, unspecified: Secondary | ICD-10-CM | POA: Diagnosis not present

## 2022-02-17 DIAGNOSIS — I442 Atrioventricular block, complete: Secondary | ICD-10-CM | POA: Insufficient documentation

## 2022-02-17 DIAGNOSIS — C01 Malignant neoplasm of base of tongue: Secondary | ICD-10-CM | POA: Diagnosis not present

## 2022-02-17 DIAGNOSIS — Z950 Presence of cardiac pacemaker: Secondary | ICD-10-CM

## 2022-02-17 DIAGNOSIS — G4700 Insomnia, unspecified: Secondary | ICD-10-CM | POA: Insufficient documentation

## 2022-02-17 DIAGNOSIS — I252 Old myocardial infarction: Secondary | ICD-10-CM | POA: Diagnosis not present

## 2022-02-17 DIAGNOSIS — H9202 Otalgia, left ear: Secondary | ICD-10-CM | POA: Diagnosis not present

## 2022-02-17 DIAGNOSIS — C770 Secondary and unspecified malignant neoplasm of lymph nodes of head, face and neck: Secondary | ICD-10-CM

## 2022-02-17 DIAGNOSIS — I7121 Aneurysm of the ascending aorta, without rupture: Secondary | ICD-10-CM | POA: Insufficient documentation

## 2022-02-17 DIAGNOSIS — Z79899 Other long term (current) drug therapy: Secondary | ICD-10-CM | POA: Diagnosis not present

## 2022-02-17 LAB — TSH: TSH: 0.886 u[IU]/mL (ref 0.350–4.500)

## 2022-02-17 MED ORDER — METOPROLOL SUCCINATE ER 25 MG PO TB24: 12.5000 mg | ORAL_TABLET | Freq: Every day | ORAL | 3 refills | Status: DC

## 2022-02-17 NOTE — Research (Signed)
[employer] 2021-05 - Specimen Collection Study to Evaluate Biomarkers in Subjects with Cancer    Patient Clarence Andrade was identified by Dr. Chryl Heck as a potential candidate for the above listed study.  This Clinical Research Nurse met with [name], [id number], on 02/17/22 in a manner and location that ensures patient privacy to discuss participation in the above listed research study.  Patient is Accompanied by his wife .  A copy of the informed consent document with embedded HIPAA language was provided to the patient.  Patient reads, speaks, and understands [place].   Patient was provided with the business card of this Nurse and encouraged to contact the research team with any questions.  Approximately 10 minutes were spent with the patient reviewing the informed consent documents.  Patient was provided the option of taking informed consent documents home to review and was encouraged to review at their convenience with their support network, including other care providers. Patient took the consent documents home to review. The research nurse will call the pt on Monday or Tuesday next week to discuss his participation and to answer any questions that he or his wife has about this optional specimen study. The pt was thanked for his interest in this study.   Brion Aliment RN, BSN, CCRP Clinical Research Nurse Lead 02/17/2022 12:06 PM

## 2022-02-17 NOTE — Progress Notes (Signed)
Radiation Oncology         [phone] ________________________________  Initial Outpatient Consultation  Name: Clarence Andrade MRN: [id number]  Date: 02/18/2022  DOB: November 05, 1945  OE:[id number], Clarence Shadow, MD  Jenetta Downer, MD   REFERRING PHYSICIAN: Jenetta Downer, MD  DIAGNOSIS: C01   ICD-10-CM   1. Squamous cell carcinoma of oropharynx (HCC)  C10.9     2. Malignant neoplasm of base of tongue (HCC)  C01       Cancer Staging  Malignant neoplasm of base of tongue (HCC) Staging form: Pharynx - HPV-Mediated Oropharynx, AJCC 8th Edition - Clinical stage from 02/18/2022: Stage II (cT3, cN1, cM0, p16+) - Unsigned  Squamous cell carcinoma of oropharynx (HCC) Staging form: Pharynx - HPV-Mediated Oropharynx, AJCC 8th Edition - Clinical: Stage I (cT2, cN1, cM0, p16+) - Signed by Benay Pike, MD on 02/17/2022  CHIEF COMPLAINT: Here to discuss management of oropharyngeal cancer  HISTORY OF PRESENT ILLNESS::Clarence Andrade is a 76 y.o. male who presented to Dr. Sabino Gasser, Otolaryngology, on 01/26/22, the with cc of a neck lump present for 1-2 months. The patient also reported issues with nasal allergies this past March which eventually developed into a left sided ear ache in July. This was around the same time that his neck lump appeared. In July, he met with his PCP for evaluation and was treated with amoxicillin for 10 days, as well as another week of penicillin by his dentist for a dental procedure. Despite antibiotics, the neck mass remained. During his initial consultation with Dr. Sabino Gasser on 09/20, the patient also endorsed a tight feeling his his throat, and difficulty swallowing solids, liquids, pills.   Subsequently, the patient underwent a flexible laryngoscopy with Dr. Sabino Gasser on 01/26/22 which revealed an exophytic friable tumor at least 2-3 cm in diameter involving the base on the tongue, extending across the midline. Exam also showed a left level 2 lymph node fixed to underlying  soft tissues. Overall, findings were notably suspicious for aggressive oropharyngeal squamous cell carcinoma.  Soft tissue neck CT on 02/02/22 for further evaluation showed a 4 cm mainly exophytic mass centered at the left base of the tongue, with at least 1 ipsilateral malignant node measuring nearly 4 cm with signs of extracapsular extension. Findings were overall consistent with metastatic squamous cell carcinoma.  I personally reviewed his imaging.  Biopsy of the enlarged left neck lymph node on 02/10/22 revealed: moderately differentiated squamous cell carcinoma; p16 positive. Diagnostic comment also noted the lymph node as presumably entirely replaced by carcinoma.   The patient met with Dr. Chryl Heck yesterday to discuss treatment options. Examination performed during this visit revealed visible and palpable left-sided cervical lymphadenopathy measuring about 4 cm or larger. Given that his involved lymph node is greater than 3 cm, Dr. Chryl Heck discussed that his ideal treatment would likely be a combination of chemotherapy (likely cisplatin) and radiation.  However, this depends on if his pending PET/CT showing no evidence of distant metastatic disease. He will also require G-tube placement if he proceeds with chemo.   PET scan will be today    Swallowing issues, if any: difficulty swallowing solids, liquids, pills, denies odynophagia   Weight Changes: may have lost about 20 pounds in the past couple months, though he is not sure if some of this is intentional  Pain status: none  Other symptoms: "throat tightness"   Tobacco history, if any: Smoked half a pack a day 35 years ago  ETOH abuse, if any: does not drink  Prior cancers, if any: none  Dealing with low blood pressure.  Denies dizziness.  He is managing this with his cardiologist.  Has titrated down on his blood pressure medications.  He does have a pacemaker and is scheduled to see his provider about this next week.  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[employer], community business; their son helps with this  PREVIOUS RADIATION THERAPY: No  PAST MEDICAL HISTORY:  has a past medical history of Abdominal hernia, Adrenal incidentaloma ([place]) (2010), Aortic stenosis, Ascending aortic aneurysm ([place]), CAD (coronary artery disease), Complete heart block ([place]), Gallstones, Heart murmur, History of pacemaker, Hyperlipidemia, Morbid obesity ([place]), Myocardial infarction ([place]), NSVT (nonsustained ventricular tachycardia) ([place]), OSA (obstructive sleep apnea), Pericarditis, Pneumonia, Pulmonary HTN ([place]), Pulmonary nodule, Screening for AAA (abdominal aortic aneurysm) (2010), and Ventral hernia.    PAST SURGICAL HISTORY: Past Surgical History:  Procedure Laterality Date   CHOLECYSTECTOMY N/A 10/11/2019   Procedure: LAPAROSCOPIC CHOLECYSTECTOMY WITH INTRAOPERATIVE CHOLANGIOGRAM;  Surgeon: Armandina Gemma, MD;  Location: [facility];  Service: General;  Laterality: N/A;   CORONARY ANGIOGRAPHY N/A 03/23/2018   Procedure: CORONARY ANGIOGRAPHY;  Surgeon: Nelva Bush, MD;  Location: [facility];  Service: Cardiovascular;  Laterality: N/A;   HERNIA REPAIR  05/2002   three   KNEE ARTHROSCOPY Left 09/2002   PACEMAKER IMPLANT N/A 11/08/2018   Procedure: PACEMAKER IMPLANT;  Surgeon: Constance Haw, MD;  Location: [facility];  Service: Cardiovascular;  Laterality: N/A;   SHOULDER SURGERY Right 2020   TOTAL KNEE ARTHROPLASTY Bilateral 09/2010   VENTRAL HERNIA REPAIR  03/2009    FAMILY HISTORY: family history includes Aneurysm in his father; Breast cancer in his sister; Colon cancer in his sister; Heart attack in his father; Heart disease in his father.  SOCIAL HISTORY:  reports that he quit smoking about 30 years ago. His smoking use included cigarettes. He smoked an average of .5 packs per day. He has never used smokeless tobacco. He reports that he does not drink alcohol and does not use drugs.  ALLERGIES: Sertraline hcl  MEDICATIONS:   Current Outpatient Medications  Medication Sig Dispense Refill   acetaminophen (TYLENOL) 500 MG tablet Take 1,000 mg by mouth every 6 (six) hours as needed (pain).     aspirin EC 81 MG tablet Take 81 mg by mouth daily.     buPROPion (WELLBUTRIN XL) 150 MG 24 hr tablet Take 150 mg by mouth every morning.     COVID-19 mRNA bivalent vaccine, Pfizer, (PFIZER COVID-19 VAC BIVALENT) injection Inject into the muscle. 0.3 mL 0   COVID-19 mRNA Vac-TriS, Pfizer, (PFIZER-BIONT COVID-19 VAC-TRIS) SUSP injection Inject into the muscle. 0.3 mL 0   influenza vaccine adjuvanted (FLUAD) 0.5 ML injection Inject into the muscle. 0.5 mL 0   isosorbide mononitrate (IMDUR) 30 MG 24 hr tablet TAKE 1 TABLET ONCE DAILY. 90 tablet 0   metoprolol succinate (TOPROL-XL) 25 MG 24 hr tablet Take 0.5 tablets (12.5 mg total) by mouth daily. 90 tablet 3   Multiple Vitamin (MULTIVITAMIN) tablet Take 1 tablet by mouth daily.     nitroGLYCERIN (NITROSTAT) 0.4 MG SL tablet Place 1 tablet (0.4 mg total) under the tongue every 5 (five) minutes as needed for chest pain. 25 tablet 3   rosuvastatin (CRESTOR) 10 MG tablet TAKE 1 TABLET ONCE DAILY. 90 tablet 0   RSV vaccine recomb adjuvanted (AREXVY) 120 MCG/0.5ML injection Inject into the muscle. 0.5 mL 0   No current facility-administered medications for this encounter.  REVIEW OF SYSTEMS:  Notable for that above.   PHYSICAL EXAM:  height is 6' (1.829 m) and weight is 276 lb 3.2 oz (125.3 kg). His temperature is 97.7 F (36.5 C). His blood pressure is 109/47 (abnormal) and his pulse is 83. His respiration is 20 and oxygen saturation is 95%.   General: Alert and oriented, in no acute distress HEENT: Head is normocephalic. Extraocular movements are intact. Oropharynx is notable for exudative necrotic base of tongue mass, left side, just visible on external exam when visualizing upper throat; this crosses midline. Neck: Neck is notable for 4cm mass, level II, left neck Heart:  Regular in rate and rhythm with no murmurs, rubs, or gallops. Chest: Clear to auscultation bilaterally, with no rhonchi, wheezes, or rales. Abdomen: Soft, nontender, nondistended, with no rigidity or guarding. Extremities: No cyanosis or edema. S/p b/l knee surgery - scars healed well Lymphatics: see Neck Exam Skin: No sign of tumor invasion in neck skin Musculoskeletal: symmetric strength and muscle tone throughout. Neurologic: Cranial nerves II through XII are grossly intact. No obvious focalities. Speech is fluent. Coordination is intact. Psychiatric: Judgment and insight are intact. Affect is appropriate.   ECOG = 1  0 - Asymptomatic (Fully active, able to carry on all predisease activities without restriction)  1 - Symptomatic but completely ambulatory (Restricted in physically strenuous activity but ambulatory and able to carry out work of a light or sedentary nature. For example, light housework, office work)  2 - Symptomatic, <50% in bed during the day (Ambulatory and capable of all self care but unable to carry out any work activities. Up and about more than 50% of waking hours)  3 - Symptomatic, >50% in bed, but not bedbound (Capable of only limited self-care, confined to bed or chair 50% or more of waking hours)  4 - Bedbound (Completely disabled. Cannot carry on any self-care. Totally confined to bed or chair)  5 - Death   Eustace Pen MM, Creech RH, Tormey DC, et al. [phone]). "Toxicity and response criteria of the [facility]". [place] 5 (6): 649-55   LABORATORY DATA:  Lab Results  Component Value Date   WBC 7.0 03/30/2021   HGB 16.0 03/30/2021   HCT 46.4 03/30/2021   MCV 92 03/30/2021   PLT 204 03/30/2021   CMP     Component Value Date/Time   NA 140 10/13/2021 0954   K 4.7 10/13/2021 0954   CL 103 10/13/2021 0954   CO2 34 (H) 10/13/2021 0954   GLUCOSE 96 10/13/2021 0954   GLUCOSE 91 10/03/2019 1516   BUN 20 10/13/2021 0954    CREATININE 0.96 10/13/2021 0954   CALCIUM 9.3 10/13/2021 0954   PROT 6.8 03/30/2021 1119   ALBUMIN 4.1 03/30/2021 1119   AST 20 03/30/2021 1119   ALT 18 03/30/2021 1119   ALKPHOS 73 03/30/2021 1119   BILITOT 0.4 03/30/2021 1119   GFRNONAA 89 03/04/2020 1431   GFRAA 103 03/04/2020 1431      Lab Results  Component Value Date   TSH 0.886 02/17/2022     RADIOGRAPHY: [place] CORE BIOPSY (LYMPH NODES)  Result Date: 02/10/2022 INDICATION: 76 year old gentleman with history of or pharyngeal malignancy presents to IR for biopsy of enlarged left neck lymph node. EXAM: Ultrasound-guided biopsy of enlarged left neck lymph node. MEDICATIONS: None. ANESTHESIA/SEDATION: None COMPLICATIONS: None immediate. PROCEDURE: Informed written consent was obtained from the patient after a thorough discussion of the procedural risks, benefits and alternatives. All questions were  addressed. Maximal Sterile Barrier Technique was utilized including caps, mask, sterile gowns, sterile gloves, sterile drape, hand hygiene and skin antiseptic. A timeout was performed prior to the initiation of the procedure. Patient position supine on the ultrasound table. Left neck skin prepped and draped in usual sterile fashion. Following local lidocaine administration, four 18 gauge cores were obtained from the enlarged left neck lymph node utilizing continuous ultrasound guidance. Samples were sent to pathology in sterile saline. Needle removed and hemostasis achieved with 2 minutes of manual compression. Post procedure ultrasound images showed no evidence of significant hemorrhage. IMPRESSION: Ultrasound-guided biopsy of left neck lymph node. Electronically Signed   By: Miachel Roux M.D.   On: 02/10/2022 14:07   CT SOFT TISSUE NECK W CONTRAST  Result Date: 02/05/2022 CLINICAL DATA:  Malignant neoplasm of oropharynx metastatic to lymph nodes of the head neck. EXAM: CT NECK WITH CONTRAST TECHNIQUE: Multidetector CT imaging of the neck was  performed using the standard protocol following the bolus administration of intravenous contrast. RADIATION DOSE REDUCTION: This exam was performed according to the departmental dose-optimization program which includes automated exposure control, adjustment of the mA and/or kV according to patient size and/or use of iterative reconstruction technique. CONTRAST:  16mL OMNIPAQUE IOHEXOL 300 MG/ML  SOLN COMPARISON:  None Available. FINDINGS: Pharynx and larynx: Masslike thickening involving the mucosa of the left tongue base extending up towards the left glossotonsillar sulcus, 4 cm craniocaudal. Inferiorly, the mass crosses midline. The mass is exophytic primarily without measurable submucosal tumor. Salivary glands: No inflammation, mass, or stone. Thyroid: Unremarkable Lymph nodes: Heterogeneous left level 2 adenopathy with lobulated contours and indistinct margins suggesting extracapsular extension, 3.7 cm. Vascular: Atheromatous calcification that is scattered. Limited intracranial: Negative Visualized orbits: Negative Mastoids and visualized paranasal sinuses: Clear Skeleton: Spondylosis and degenerative cervical spine spurring. No destructive changes. Upper chest: Calcified nodule at the left apex. No noncalcified nodules. IMPRESSION: 4 cm mainly exophytic mass centered at the left base of tongue with at least 1 ipsilateral malignant node measuring nearly 4 cm with signs of extracapsular extension. Findings consistent with metastatic squamous cell carcinoma. Electronically Signed   By: Jorje Guild M.D.   On: 02/05/2022 04:21   CUP PACEART REMOTE DEVICE CHECK  Result Date: 02/03/2022 Scheduled remote reviewed. Normal device function.  2 NSVT, 6 & 8 beats Next remote 91 days. LA     IMPRESSION/PLAN:  This is a delightful patient with base of tongue, p16+, head and neck cancer. I recommend radiotherapy for this patient. Anticipate concurrent chemotherapy. PET pending for final staging.  We discussed  the potential risks, benefits, and side effects of radiotherapy. We talked in detail about acute and late effects. We discussed that some of the most bothersome acute effects may be mucositis, dysgeusia, salivary changes, skin irritation, hair loss, dehydration, weight loss and fatigue. We talked about late effects which include but are not necessarily limited to dysphagia, hypothyroidism, nerve injury, vascular injury, spinal cord injury, xerostomia, trismus, neck edema, and potential injury to any of the tissues in the head and neck region. No guarantees of treatment were given. A consent form was signed and placed in the patient's medical record. The patient is enthusiastic about proceeding with treatment. I look forward to participating in the patient's care.    Simulation (treatment planning) will take place after PET and release by dentistry.  We also discussed that the treatment of head and neck cancer is a multidisciplinary process to maximize treatment outcomes and quality of life. For  this reason the following referrals have been or will be made:   Medical oncology to discuss chemotherapy    Dentistry for dental evaluation, possible extractions in the radiation fields, and /or advice on reducing risk of cavities, osteoradionecrosis, or other oral issues. I've contacted Dr Benson Norway personally.   Nutritionist for nutrition support during and after treatment.   Speech language pathology for swallowing and/or speech therapy.   Social work for social support.    Physical therapy due to risk of lymphedema in neck and deconditioning.   On date of service, in total, I spent 65 minutes on this encounter. Patient was seen in person.  __________________________________________   [name], MD  This document serves as a record of services personally performed by [name], MD. It was created on her behalf by Roney Mans, a trained medical scribe. The creation of this record is based on  the scribe's personal observations and the provider's statements to them. This document has been checked and approved by the attending provider.

## 2022-02-17 NOTE — Assessment & Plan Note (Signed)
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  Patient arrived to the appointment today with his wife.  He tells me that he was in good shape although he had cardiac disease, may have had a heart attack many years ago, currently has a pacemaker in place.  He is also on multiple blood pressure medications and was very dizzy and hypotensive today at the time of my visit.  He has noticed some difficulty swallowing and has also been complaining of dysgeusia, he may have lost about 20 pounds of weight in the past couple months, not sure if some of this is intentional.  Physical examination today and, noted visible and palpable left-sided cervical lymphadenopathy measuring about 4 cm or larger.  We have discussed that this is an early stage a oropharyngeal cancer and the intent of treatment is curable.  Given his lymph node greater than 3 cm, ideal treatment would be to consider combination of chemotherapy and radiation.  However he understands that this is mostly if his PET/CT is without any evidence of distant metastatic disease.  We have discussed about the regimen which is likely chemotherapy cisplatin 40 mg per metered square.  Were discussed about mechanism of action, adverse effects of chemotherapy including but not limited to fatigue, nausea, vomiting, increased risk of infections, ototoxicity, nephrotoxicity, neuropathy etc.  He understands that he will need a G-tube placement and port for chemotherapy administration.  He tells me that he is pretty good for his age, does water aerobics 5 times a week tries to stay active and would really like to do what ever treatment is considered best for him.  We can attempt concurrent chemoradiation and if he has difficulty tolerating it, we have discussed about dropping chemo and proceeding with radiation  alone.  All his questions were answered to the best my knowledge.  Thank you for consulting [place] the care of this patient.  Please not hesitate to contact us with any additional questions or concerns

## 2022-02-17 NOTE — Progress Notes (Signed)
Oncology Nurse Navigator Documentation   Met with patient during initial consult with Dr. Chryl Heck. He was accompanied by his wife.  Further introduced myself as his/their Navigator, explained my role as a member of the Care Team. Provided New Patient Information packet: Contact information for physician, this navigator, other members of the Care Team Advance Directive information ([place] blue pamphlet with LCSW insert); provided [facility] AD booklet at his request,  Fall Prevention Patient Placer Information sheet Symptom Management Clinic information [facility] map with highlight of [place] SLP Information sheet Provided and discussed educational handouts for PEG and PAC. Assisted with post-consult appt scheduling. They verbalized understanding of information provided and know that I will meet them with Dr. Isidore Moos as well tomorrow.  I encouraged them to call with questions/concerns moving forward.  Harlow Asa, RN, BSN, OCN Head & Neck Oncology Nurse [place] [facility] [place] [phone]

## 2022-02-17 NOTE — Progress Notes (Signed)
[place] CONSULT NOTE  Patient Care Team: Lajean Manes, MD as PCP - General (Internal Medicine) Sueanne Margarita, MD as PCP - Cardiology (Cardiology) Constance Haw, MD as PCP - Electrophysiology (Cardiology) Malmfelt, Stephani Police, RN as Oncology Nurse Navigator Benay Pike, MD as Consulting Physician (Hematology and Oncology) [name], MD as Consulting Physician (Radiation Oncology)  CHIEF COMPLAINTS/PURPOSE OF CONSULTATION:  Squamous cell carcinoma of the oropharynx  ASSESSMENT & PLAN:  Squamous cell carcinoma of oropharynx [facility]) This is a very pleasant 76 year old male patient with 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  Patient arrived to the appointment today with his wife.  He tells me that he was in good shape although he had cardiac disease, may have had a heart attack many years ago, currently has a pacemaker in place.  He is also on multiple blood pressure medications and was very dizzy and hypotensive today at the time of my visit.  He has noticed some difficulty swallowing and has also been complaining of dysgeusia, he may have lost about 20 pounds of weight in the past couple months, not sure if some of this is intentional.  Physical examination today and, noted visible and palpable left-sided cervical lymphadenopathy measuring about 4 cm or larger.  We have discussed that this is an early stage a oropharyngeal cancer and the intent of treatment is curable.  Given his lymph node greater than 3 cm, ideal treatment would be to consider combination of chemotherapy and radiation.  However he understands that this is mostly if his PET/CT is without any evidence of distant metastatic disease.  We have discussed about the regimen which is likely chemotherapy cisplatin 40 mg per metered square.  Were discussed about mechanism of action, adverse  effects of chemotherapy including but not limited to fatigue, nausea, vomiting, increased risk of infections, ototoxicity, nephrotoxicity, neuropathy etc.  He understands that he will need a G-tube placement and port for chemotherapy administration.  He tells me that he is pretty good for his age, does water aerobics 5 times a week tries to stay active and would really like to do what ever treatment is considered best for him.  We can attempt concurrent chemoradiation and if he has difficulty tolerating it, we have discussed about dropping chemo and proceeding with radiation alone.  All his questions were answered to the best my knowledge.  Thank you for consulting [place] the care of this patient.  Please not hesitate to contact us with any additional questions or concerns  No orders of the defined types were placed in this encounter.    HISTORY OF PRESENTING ILLNESS:  HELAMAN MECCA 76 y.o. male is here because of SCC oropharynx.  Oncologic History   This is a very pleasant 76 year old male patient who was seen by ENT with chief complaint of lump in his neck ongoing for the past 1 to 2 months.  Around July he suddenly noticed the lump in the neck.  He was initially treated by antibiotics but this has not changed.  He also has started noticing some difficulty swallowing and some dysgeusia.  He tells me that he has lost about 20 pounds of weight in the past 2 to 3 months but part of it could be intentional.  He was seen by Dr. Synetta Shadow on September 20 for an initial consultation and he had flexible fiberoptic endoscopy the same day  which noticed large base of tongue tumor.  This tumor measured at least 2 to 3 cm in diameter extending across midline.  He also had a CT on September 27 which confirmed 4 cm mainly exophytic mass centered at the left base of the tongue with at least 1 ipsilateral malignant node measuring nearly 4 cm with signs of extracapsular extension.  Findings consistent with metastatic  squamous cell carcinoma.  He is here for an initial evaluation by medical oncology.  He will be having a PET/CT tomorrow and will follow up with radiation oncology as well.  Rest of the pertinent 10 point ROS reviewed and negative  REVIEW OF SYSTEMS:   Constitutional: Denies fevers, chills or abnormal night sweats Eyes: Denies blurriness of vision, double vision or watery eyes Ears, nose, mouth, throat, and face: Denies mucositis or sore throat Respiratory: Denies cough, dyspnea or wheezes Cardiovascular: Denies palpitation, chest discomfort or lower extremity swelling Gastrointestinal:  Denies nausea, heartburn or change in bowel habits Skin: Denies abnormal skin rashes Lymphatics: Denies new lymphadenopathy or easy bruising Neurological:Denies numbness, tingling or new weaknesses Behavioral/Psych: Mood is stable, no new changes  All other systems were reviewed with the patient and are negative.  MEDICAL HISTORY:  Past Medical History:  Diagnosis Date   Abdominal hernia    Midline   Adrenal incidentaloma ([place]) 2010   Aortic stenosis    Ascending aortic aneurysm ([place])    a. 4.4cm by imaging 03/2020.   CAD (coronary artery disease)    a. Cath 03/2018 - CTO of the prox-mid LAD with bridging, L-L and R-L collaterals, moderate, non-obstructive disease involving the proximal LAD, proximal LCx, and distal RCA, more severe disease noted involving small branch vessels (RV marginal and superior branch of OM3).   Complete heart block (HCC)    Gallstones    Heart murmur    History of pacemaker    a. 11/2018 - [employer], intermittent CHB.   Hyperlipidemia    Morbid obesity (HCC)    Myocardial infarction [facility])    NSVT (nonsustained ventricular tachycardia) (HCC)    OSA (obstructive sleep apnea)    Severe PSG 1/07 AHI 66/hr, O2 Nadir 50% Refuses treatment - Pt does not believe test was accurate   Pericarditis    Pneumonia    Pulmonary HTN (HCC)    mild with PASP 79mHg by echo 02/2018,  not seen on 03/2020 echo   Pulmonary nodule    Screening for AAA (abdominal aortic aneurysm) 2010   CT Abd/Pelvis   Ventral hernia    03/2009    SURGICAL HISTORY: Past Surgical History:  Procedure Laterality Date   CHOLECYSTECTOMY N/A 10/11/2019   Procedure: LAPAROSCOPIC CHOLECYSTECTOMY WITH INTRAOPERATIVE CHOLANGIOGRAM;  Surgeon: GArmandina Gemma MD;  Location: [facility];  Service: General;  Laterality: N/A;   CORONARY ANGIOGRAPHY N/A 03/23/2018   Procedure: CORONARY ANGIOGRAPHY;  Surgeon: ENelva Bush MD;  [facility];  Service: Cardiovascular;  Laterality: N/A;   HERNIA REPAIR  05/2002   three   KNEE ARTHROSCOPY Left 09/2002   PACEMAKER IMPLANT N/A 11/08/2018   Procedure: PACEMAKER IMPLANT;  Surgeon: CConstance Haw MD;  [facility];  Service: Cardiovascular;  Laterality: N/A;   SHOULDER SURGERY Right 2020   TOTAL KNEE ARTHROPLASTY Bilateral 09/2010   VENTRAL HERNIA REPAIR  03/2009    SOCIAL HISTORY: Social History   Socioeconomic History   Marital status: Married    Spouse name: Not on file   Number of children: Not on  file   Years of education: Not on file   Highest education level: Not on file  Occupational History   Not on file  Tobacco Use   Smoking status: Former   Smokeless tobacco: Never  Vaping Use   Vaping Use: Never used  Substance and Sexual Activity   Alcohol use: No   Drug use: No   Sexual activity: Not on file  Other Topics Concern   Not on file  Social History Narrative   Not on file   Social Determinants of Health   Financial Resource Strain: Not on file  Food Insecurity: Not on file  Transportation Needs: Not on file  Physical Activity: Not on file  Stress: Not on file  Social Connections: Not on file  Intimate Partner Violence: Not on file    FAMILY HISTORY: Family History  Problem Relation Age of Onset   Heart attack Father    Heart disease Father    Aneurysm Father     ALLERGIES:  is  allergic to sertraline hcl.  MEDICATIONS:  Current Outpatient Medications  Medication Sig Dispense Refill   acetaminophen (TYLENOL) 500 MG tablet Take 1,000 mg by mouth every 6 (six) hours as needed (pain).     aspirin EC 81 MG tablet Take 81 mg by mouth daily.     buPROPion (WELLBUTRIN XL) 150 MG 24 hr tablet Take 150 mg by mouth every morning.     COVID-19 mRNA bivalent vaccine, Pfizer, (PFIZER COVID-19 VAC BIVALENT) injection Inject into the muscle. 0.3 mL 0   COVID-19 mRNA Vac-TriS, Pfizer, (PFIZER-BIONT COVID-19 VAC-TRIS) SUSP injection Inject into the muscle. 0.3 mL 0   influenza vaccine adjuvanted (FLUAD) 0.5 ML injection Inject into the muscle. 0.5 mL 0   isosorbide mononitrate (IMDUR) 30 MG 24 hr tablet TAKE 1 TABLET ONCE DAILY. 90 tablet 0   lisinopril (ZESTRIL) 5 MG tablet Take 5 mg by mouth at bedtime.      metoprolol succinate (TOPROL-XL) 25 MG 24 hr tablet Take 1 tablet (25 mg total) by mouth daily. 90 tablet 3   Multiple Vitamin (MULTIVITAMIN) tablet Take 1 tablet by mouth daily.     nitroGLYCERIN (NITROSTAT) 0.4 MG SL tablet Place 1 tablet (0.4 mg total) under the tongue every 5 (five) minutes as needed for chest pain. 25 tablet 3   rosuvastatin (CRESTOR) 10 MG tablet TAKE 1 TABLET ONCE DAILY. 90 tablet 0   RSV vaccine recomb adjuvanted (AREXVY) 120 MCG/0.5ML injection Inject into the muscle. 0.5 mL 0   No current facility-administered medications for this visit.     PHYSICAL EXAMINATION: ECOG PERFORMANCE STATUS: 0 - Asymptomatic  Vitals:   02/17/22 0958  BP: (!) 82/56  Pulse: 85  Resp: 16  Temp: 98.2 F (36.8 C)  SpO2: 100%   Filed Weights   02/17/22 0958  Weight: 281 lb (127.5 kg)    Physical Exam Constitutional:      Appearance: Normal appearance.  Cardiovascular:     Rate and Rhythm: Normal rate and regular rhythm.     Pulses: Normal pulses.     Heart sounds: Normal heart sounds.  Musculoskeletal:        General: Normal range of motion.      Cervical back: Normal range of motion and neck supple. No rigidity.  Lymphadenopathy:     Cervical: Cervical adenopathy (Large visible and palpable left-sided lymphadenopathy noted measuring around 4 cm) present.  Skin:    General: Skin is warm and dry.  Neurological:  Mental Status: He is alert.    LABORATORY DATA:  I have reviewed the data as listed Lab Results  Component Value Date   WBC 7.0 03/30/2021   HGB 16.0 03/30/2021   HCT 46.4 03/30/2021   MCV 92 03/30/2021   PLT 204 03/30/2021     Chemistry      Component Value Date/Time   NA 140 10/13/2021 0954   K 4.7 10/13/2021 0954   CL 103 10/13/2021 0954   CO2 34 (H) 10/13/2021 0954   BUN 20 10/13/2021 0954   CREATININE 0.96 10/13/2021 0954      Component Value Date/Time   CALCIUM 9.3 10/13/2021 0954   ALKPHOS 73 03/30/2021 1119   AST 20 03/30/2021 1119   ALT 18 03/30/2021 1119   BILITOT 0.4 03/30/2021 1119       RADIOGRAPHIC STUDIES: I have personally reviewed the radiological images as listed and agreed with the findings in the report. [place] CORE BIOPSY (LYMPH NODES)  Result Date: 02/10/2022 INDICATION: 76 year old gentleman with history of or pharyngeal malignancy presents to IR for biopsy of enlarged left neck lymph node. EXAM: Ultrasound-guided biopsy of enlarged left neck lymph node. MEDICATIONS: None. ANESTHESIA/SEDATION: None COMPLICATIONS: None immediate. PROCEDURE: Informed written consent was obtained from the patient after a thorough discussion of the procedural risks, benefits and alternatives. All questions were addressed. Maximal Sterile Barrier Technique was utilized including caps, mask, sterile gowns, sterile gloves, sterile drape, hand hygiene and skin antiseptic. A timeout was performed prior to the initiation of the procedure. Patient position supine on the ultrasound table. Left neck skin prepped and draped in usual sterile fashion. Following local lidocaine administration, four 18 gauge cores were  obtained from the enlarged left neck lymph node utilizing continuous ultrasound guidance. Samples were sent to pathology in sterile saline. Needle removed and hemostasis achieved with 2 minutes of manual compression. Post procedure ultrasound images showed no evidence of significant hemorrhage. IMPRESSION: Ultrasound-guided biopsy of left neck lymph node. Electronically Signed   By: Miachel Roux M.D.   On: 02/10/2022 14:07   CT SOFT TISSUE NECK W CONTRAST  Result Date: 02/05/2022 CLINICAL DATA:  Malignant neoplasm of oropharynx metastatic to lymph nodes of the head neck. EXAM: CT NECK WITH CONTRAST TECHNIQUE: Multidetector CT imaging of the neck was performed using the standard protocol following the bolus administration of intravenous contrast. RADIATION DOSE REDUCTION: This exam was performed according to the departmental dose-optimization program which includes automated exposure control, adjustment of the mA and/or kV according to patient size and/or use of iterative reconstruction technique. CONTRAST:  57m OMNIPAQUE IOHEXOL 300 MG/ML  SOLN COMPARISON:  None Available. FINDINGS: Pharynx and larynx: Masslike thickening involving the mucosa of the left tongue base extending up towards the left glossotonsillar sulcus, 4 cm craniocaudal. Inferiorly, the mass crosses midline. The mass is exophytic primarily without measurable submucosal tumor. Salivary glands: No inflammation, mass, or stone. Thyroid: Unremarkable Lymph nodes: Heterogeneous left level 2 adenopathy with lobulated contours and indistinct margins suggesting extracapsular extension, 3.7 cm. Vascular: Atheromatous calcification that is scattered. Limited intracranial: Negative Visualized orbits: Negative Mastoids and visualized paranasal sinuses: Clear Skeleton: Spondylosis and degenerative cervical spine spurring. No destructive changes. Upper chest: Calcified nodule at the left apex. No noncalcified nodules. IMPRESSION: 4 cm mainly exophytic mass  centered at the left base of tongue with at least 1 ipsilateral malignant node measuring nearly 4 cm with signs of extracapsular extension. Findings consistent with metastatic squamous cell carcinoma. Electronically Signed   By: JJorje Guild  M.D.   On: 02/05/2022 04:21   CUP PACEART REMOTE DEVICE CHECK  Result Date: 02/03/2022 Scheduled remote reviewed. Normal device function.  2 NSVT, 6 & 8 beats Next remote 91 days. LA   All questions were answered. The patient knows to call the clinic with any problems, questions or concerns. I spent 60 minutes in the care of this patient including H and P, review of records, counseling and coordination of care.     Benay Pike, MD 02/17/2022 11:53 AM

## 2022-02-18 ENCOUNTER — Encounter: Payer: Self-pay | Admitting: Radiation Oncology

## 2022-02-18 ENCOUNTER — Ambulatory Visit
Admission: RE | Admit: 2022-02-18 | Discharge: 2022-02-18 | Disposition: A | Discharge status: Home / Self Care | Payer: PPO | Source: Ambulatory Visit | Attending: Radiation Oncology | Admitting: Radiation Oncology

## 2022-02-18 ENCOUNTER — Other Ambulatory Visit: Payer: Self-pay

## 2022-02-18 ENCOUNTER — Encounter (HOSPITAL_COMMUNITY)
Admission: RE | Admit: 2022-02-18 | Discharge: 2022-02-18 | Disposition: A | Discharge status: Home / Self Care | Payer: PPO | Source: Ambulatory Visit | Attending: Otolaryngology | Admitting: Otolaryngology

## 2022-02-18 VITALS — BP 109/47 | HR 83 | Temp 97.7°F | Resp 20 | Ht 72.0 in | Wt 276.2 lb

## 2022-02-18 DIAGNOSIS — Z7982 Long term (current) use of aspirin: Secondary | ICD-10-CM | POA: Insufficient documentation

## 2022-02-18 DIAGNOSIS — Z87891 Personal history of nicotine dependence: Secondary | ICD-10-CM | POA: Insufficient documentation

## 2022-02-18 DIAGNOSIS — E785 Hyperlipidemia, unspecified: Secondary | ICD-10-CM | POA: Insufficient documentation

## 2022-02-18 DIAGNOSIS — I272 Pulmonary hypertension, unspecified: Secondary | ICD-10-CM | POA: Insufficient documentation

## 2022-02-18 DIAGNOSIS — Z79899 Other long term (current) drug therapy: Secondary | ICD-10-CM | POA: Insufficient documentation

## 2022-02-18 DIAGNOSIS — I714 Abdominal aortic aneurysm, without rupture, unspecified: Secondary | ICD-10-CM | POA: Insufficient documentation

## 2022-02-18 DIAGNOSIS — Z950 Presence of cardiac pacemaker: Secondary | ICD-10-CM | POA: Diagnosis not present

## 2022-02-18 DIAGNOSIS — C770 Secondary and unspecified malignant neoplasm of lymph nodes of head, face and neck: Secondary | ICD-10-CM | POA: Insufficient documentation

## 2022-02-18 DIAGNOSIS — C109 Malignant neoplasm of oropharynx, unspecified: Secondary | ICD-10-CM

## 2022-02-18 DIAGNOSIS — C01 Malignant neoplasm of base of tongue: Secondary | ICD-10-CM | POA: Insufficient documentation

## 2022-02-18 DIAGNOSIS — I350 Nonrheumatic aortic (valve) stenosis: Secondary | ICD-10-CM | POA: Insufficient documentation

## 2022-02-18 DIAGNOSIS — R011 Cardiac murmur, unspecified: Secondary | ICD-10-CM | POA: Insufficient documentation

## 2022-02-18 DIAGNOSIS — I252 Old myocardial infarction: Secondary | ICD-10-CM | POA: Diagnosis not present

## 2022-02-18 DIAGNOSIS — I2510 Atherosclerotic heart disease of native coronary artery without angina pectoris: Secondary | ICD-10-CM | POA: Diagnosis not present

## 2022-02-18 LAB — GLUCOSE, CAPILLARY: Glucose-Capillary: 111 mg/dL — ABNORMAL HIGH (ref 70–99)

## 2022-02-18 MED ORDER — FLUDEOXYGLUCOSE F - 18 (FDG) INJECTION
13.7900 | Freq: Once | INTRAVENOUS | Status: AC
  Administered 2022-02-18: 13.79 via INTRAVENOUS

## 2022-02-18 NOTE — Progress Notes (Signed)
Oncology Nurse Navigator Documentation   Met with patient during initial consult with Dr. Isidore Moos.  He was accompanied by his wife.  Further introduced myself as his/their Navigator, explained my role as a member of the Care Team. I toured him to the CT simulation and LINAC treatment area, explained procedures for lobby registration, arrival to Radiation Waiting, and arrival to treatment area.  He voiced understanding.   Showed them the location of Dr. Raynelle Dick office and [facility] as reference for future appts, including arrival procedure for these appts.   They verbalized understanding of information provided. I encouraged them to call with questions/concerns moving forward.  Harlow Asa, RN, BSN, OCN Head & Neck Oncology Nurse [place] [facility] [place] [phone]

## 2022-02-19 DIAGNOSIS — R319 Hematuria, unspecified: Secondary | ICD-10-CM | POA: Diagnosis not present

## 2022-02-19 DIAGNOSIS — N390 Urinary tract infection, site not specified: Secondary | ICD-10-CM | POA: Diagnosis not present

## 2022-02-21 ENCOUNTER — Encounter (HOSPITAL_COMMUNITY): Payer: Self-pay | Admitting: Dentistry

## 2022-02-21 ENCOUNTER — Ambulatory Visit (INDEPENDENT_AMBULATORY_CARE_PROVIDER_SITE_OTHER): Payer: PPO | Admitting: Dentistry

## 2022-02-21 ENCOUNTER — Telehealth: Payer: Self-pay | Admitting: *Deleted

## 2022-02-21 VITALS — BP 132/80 | HR 80 | Temp 98.9°F

## 2022-02-21 DIAGNOSIS — C109 Malignant neoplasm of oropharynx, unspecified: Secondary | ICD-10-CM | POA: Diagnosis not present

## 2022-02-21 DIAGNOSIS — K06010 Localized gingival recession, unspecified: Secondary | ICD-10-CM

## 2022-02-21 DIAGNOSIS — Z01818 Encounter for other preprocedural examination: Secondary | ICD-10-CM | POA: Diagnosis not present

## 2022-02-21 DIAGNOSIS — M270 Developmental disorders of jaws: Secondary | ICD-10-CM

## 2022-02-21 DIAGNOSIS — M278 Other specified diseases of jaws: Secondary | ICD-10-CM

## 2022-02-21 DIAGNOSIS — K029 Dental caries, unspecified: Secondary | ICD-10-CM

## 2022-02-21 DIAGNOSIS — K032 Erosion of teeth: Secondary | ICD-10-CM

## 2022-02-21 DIAGNOSIS — M264 Malocclusion, unspecified: Secondary | ICD-10-CM

## 2022-02-21 DIAGNOSIS — K036 Deposits [accretions] on teeth: Secondary | ICD-10-CM

## 2022-02-21 DIAGNOSIS — K0530 Chronic periodontitis, unspecified: Secondary | ICD-10-CM

## 2022-02-21 DIAGNOSIS — K030 Excessive attrition of teeth: Secondary | ICD-10-CM

## 2022-02-21 DIAGNOSIS — K08109 Complete loss of teeth, unspecified cause, unspecified class: Secondary | ICD-10-CM

## 2022-02-21 NOTE — Telephone Encounter (Signed)
[employer] 2021-05 - Specimen Collection Study to Evaluate Biomarkers in Subjects with Cancer    The research nurse called the pt to discuss the study consent and answer his questions.  The pt confirmed that he has read the consent form, and he wants to participate in the study.  Since the pt is scheduled to come to the [facility] on 10/19, the pt agreed to meet with the research staff to sign the consent form.  The pt's port will be placed next week, and his research samples will be drawn from the port before he begins any treatment.  The pt verbalized understanding with the plan.  The pt was thanked for his support of this specimen study.  Harlow Asa, Head/Neck navigator, will call the research department to meet with him on Thursday 10/19.    Brion Aliment RN, BSN, CCRP Clinical Research Nurse Lead 02/21/2022 4:49 PM

## 2022-02-21 NOTE — Patient Instructions (Signed)
[facility] DEPARTMENT OF DENTAL MEDICINE Dr. Jenney Brester B. Zylon Creamer, D.M.D. Phone: [phone] Fax: [phone]        It was a pleasure seeing you today!  Please refer to the information below regarding your dental visit with us.  Please do not hesitate to give us a call if any questions or concerns come up after you leave.    Thank you for letting us provide care for you.  If there is anything we can do for you, please let us know.      RADIATION THERAPY AND INFORMATION REGARDING YOUR TEETH  XEROSTOMIA (dry mouth):  Your salivary glands may be in the field of radiation.  Radiation may include all or only part of your salivary glands.  This will cause your saliva to dry up, and you will have a dry mouth.  The dry mouth will be for the rest of your life unless your radiation oncologist tells you otherwise.   Your saliva has many functions: It wets your tongue for speaking. It coats your teeth and the inside of your mouth for easier movement. It helps with chewing and swallowing food. It helps clean away harmful acid and toxic products made by the germs in your mouth, therefore it helps prevent cavities. It kills some germs in your mouth and helps to prevent gum disease. It helps to carry flavor to your taste buds.  Once you have lost your saliva, you will be at higher risk for tooth decay and gum disease.     What can be done to help improve your mouth when there's not enough saliva? Your dentist may give a recommendation for CLoSYS.  It will not bring back all of your saliva but may bring back some of it.  Also, your saliva may be thick and ropy or white and foamy.  It will not feel like it use to feel. You will need to swish with water every time your mouth feels dry.  YOU CANNOT suck on any cough drops, mints, lemon drops, candy, vitamin C or any other products.  You cannot use anything other than water to make your mouth feel less dry.  If you want to  drink anything else, you have to drink it all at once and brush afterwards.  Be sure to discuss the details of your diet habits with your dentist or hygienist.   RADIATION CARIES:  This is decay (cavities) that happens very quickly  once your mouth is very dry due to radiation therapy.  Normally, cavities take six months to two years to become a problem.  When you have dry mouth, cavities may take as little as eight weeks to cause you a problem.    Dental check-ups every 2-4 months are necessary as long as you have a dry mouth.  Radiation caries typically, but not always, starts at your gum line where it is hard to see the cavity.  It is therefore also hard to fill these cavities adequately.  This high rate of cavities happens because your mouth no longer has saliva and therefore the acid made by the germs starts the decay process.  Whenever you eat anything the germs in your mouth change the food into acid.  The acid then burns a small hole in your tooth.  This small hole is the beginning of a cavity.  If this is not treated then it will grow bigger and become a cavity.  The way to avoid this hole getting bigger   is to use fluoride every evening as prescribed by your dentist following your radiation. NOTE:  You have to make sure that your teeth are very clean before you use the fluoride.  This fluoride in turn will strengthen your teeth and prepare them for another day of fighting acid. If you develop radiation caries many times, the damage is so large that you will have to have all your teeth removed.  This could be a big problem if some of these teeth are in the field of radiation.  Further details of why this could be a big problem will follow (see Osteoradionecrosis below).   DYSGEUSIA (loss of taste):  This happens to varying degrees once you've had radiation therapy to your jaw region.  Many times taste is not completely lost, but becomes limited.  The loss of taste is mostly due to radiation  affecting your taste buds.  However, if you have no saliva in your mouth to carry the flavor to your taste buds, it would be difficult for your taste buds to taste anything.  That is why using water or over-the-counter brand CLoSYS prior to meals and during meal times may help with some of the taste.  Keep in mind that taste generally returns very slowly over the course of several months or several years after radiation therapy.  Don't give up hope.   TRISMUS (limited jaw opening):  According to your radiation oncologist, your TMJ or jaw joints are going to be partially or fully in the field of radiation.  This means that over time the muscles that help you open and close your mouth may get stiff.  This will potentially result in your not being able to open your mouth wide enough or as wide as you can open it now.  Let me give you an example of how slowly this happens and how unaware people are of it: A gentlemen that had radiation therapy 2 years ago went back to his dentist complaining that "bananas were just too large for him to be able to fit them in between his teeth."  He was no longer able to open wide enough to bite into a banana.  This happened slowly and over a period of time.   What we do to try and prevent this:   Your dentist will probably give you a stack of sticks called a trismus exercise device.  This stack will help remind your muscles and your jaw joints to open up to the same distance every day.  Use these sticks every morning when you wake up, or according to the instructions given by your dentist.    You must use these sticks for at least one to two years after radiation therapy.  The reason for that is because it happens so slowly and keeps going on for about two years after radiation therapy.  Your hospital dentist will help you monitor your mouth opening and make sure that it's not getting smaller after radiation.      TRISMUS EXERCISES: Using the stack of sticks given to you by  your dentist, place the stack in your mouth and hold onto the other end for support. Leave the sticks in your mouth while holding the other end.  Allow 30 seconds for muscle stretching. Rest for a few seconds. Repeat 3-5 times. This exercise is recommended in the mornings and evenings unless otherwise instructed. The exercise should be done for a period of 2 YEARS after the end of radiation. Your maximum   jaw opening should be checked regularly at recall dental visits by your general dentist. You should report any changes, soreness, or difficulties encountered when doing the exercises to your dentist.   OSTEORADIONECROSIS (ORN):  This is a condition where your jaw bone after radiation therapy becomes very dry.  It has very little blood supply to keep it alive.  If you develop a cavity that turns into an abscess or an infection, then the jaw bone does not have enough blood supply to help fight the infection.  At this point it is very likely that the infection could cause the death of your jaw bone.  When you have dead bone it has to be removed.  Therefore, you might end up having to have surgery to remove part of your jaw bone, the part of the jaw bone that has been affected.     Healing is also a problem if you are to have surgery (like a tooth extraction) in the areas where the bone has had radiation therapy.  If you have surgery, you need more blood supply to heal which is not available.  When blood supply and oxygen are not available, there is a chance for the bone to die. Occasionally, ORN happens on its own with no obvious reason, but this is quite rare.  We believe that patients who continue to smoke and/or drink alcohol have a higher chance of having this problem. Once your jaw bone has had radiation therapy, if there are any remaining teeth in that area, it is not recommended to have them pulled unless your dentist or oral surgeon is aware of your history of radiation and believes it is safe.   The risks for ORN either from infection or spontaneously occurring (with no reason) are life long.      Questions?  Call our office during office hours at [phone].  

## 2022-02-21 NOTE — Progress Notes (Unsigned)
[facility] Department of Dental Medicine     OUTPATIENT CONSULTATION   PLAN/RECOMMENDATIONS     ASSESSMENT: There are no current signs of acute odontogenic infection including abscess, edema or erythema, or suspicious lesion requiring biopsy.   Caries, *** ; periodontal concerns that include ***     RECOMMENDATIONS: No dental intervention indicated prior to starting radiation at this time.      PLAN: Discuss case with medical team and coordinate treatment as needed. Return for delivery of scatter protection devices. Call if any questions or concerns arise before next visit.   Discussed in detail all treatment options and recommendations with the patient and they are agreeable to the plan.   Thank you for consulting with Hospital Dent[facility] and for the opportunity to participate in this patient's treatment.  Should you have any questions or concerns, please contact the [facility] at [phone].    Service Date:   02/21/2022  Patient Name:   Clarence Andrade Date of Birth:   [birth year] Medical Record Number: [id number]  Referring Provider:          [name], MD   HISTORY OF PRESENT ILLNESS: Clarence Andrade is a very pleasant 76 y.o. male with history of abdominal hernia, adrenal incidentaloma (2010), aortic stenosis, ascending aortic aneurysm (CAD), complete heart block, gallstones, heart murmur, history of pacemaker placement, hyperlipidemia, morbid obesity, myocardial infarction, NSVT, obstructive sleep apnea, pericarditis, pulmonary hypertension, pneumonia and tobacco use (cigarettes- quit about 30 years ago) who was recently diagnosed with oropharyngeal cancer (malignant neoplasm of base of tongue) and is anticipating chemoradiation therapy.   The patient presents today for a medically necessary dental consultation as part of their pre-head and neck radiation therapy work-up.   DENTAL HISTORY: The patient reports that he does have a  dentist he sees regularly, Clarence Andrade. He reports that he does go twice a year for cleanings and exams.  His last visit was in May 2023.  He also states that he had a wisdom tooth extracted not too long ago (tooth #1) by an oral Psychologist, sport and exercise.  He currently denies any dental/orofacial pain or sensitivity. Patient is able to manage oral secretions.  Patient denies dysphagia, odynophagia, dysphonia, SOB and neck pain.  Patient denies fever, rigors and malaise.   CHIEF COMPLAINT:  Here for a pre-head and neck radiation dental evaluation.   Patient Active Problem List   Diagnosis Date Noted   Malignant neoplasm of base of tongue ([place]) 02/18/2022   Squamous cell carcinoma of oropharynx ([place]) 02/17/2022   Cholelithiasis with chronic cholecystitis 10/05/2019   Stable angina 03/23/2018   Abnormal stress test 03/23/2018   Aortic stenosis    Pulmonary HTN (HCC)    Exertional chest pain 03/07/2018   Coronary artery calcification seen on CAT scan 07/03/2013   Heart murmur 07/03/2013   Pericarditis 06/12/2013   Past Medical History:  Diagnosis Date   Abdominal hernia    Midline   Adrenal incidentaloma ([place]) 2010   Aortic stenosis    Ascending aortic aneurysm ([place])    a. 4.4cm by imaging 03/2020.   CAD (coronary artery disease)    a. Cath 03/2018 - CTO of the prox-mid LAD with bridging, L-L and R-L collaterals, moderate, non-obstructive disease involving the proximal LAD, proximal LCx, and distal RCA, more severe disease noted involving small branch vessels (RV marginal and superior branch of OM3).   Complete heart block (HCC)    Gallstones    Heart murmur  History of pacemaker    a. 11/2018 - Medtronic, intermittent CHB.   Hyperlipidemia    Morbid obesity (HCC)    Myocardial infarction [facility])    NSVT (nonsustained ventricular tachycardia) (HCC)    OSA (obstructive sleep apnea)    Severe PSG 1/07 AHI 66/hr, O2 Nadir 50% Refuses treatment - Pt does not believe test was accurate    Pericarditis    Pneumonia    Pulmonary HTN (HCC)    mild with PASP 59mHg by echo 02/2018, not seen on 03/2020 echo   Pulmonary nodule    Screening for AAA (abdominal aortic aneurysm) 2010   CT Abd/Pelvis   Ventral hernia    03/2009   Past Surgical History:  Procedure Laterality Date   CHOLECYSTECTOMY N/A 10/11/2019   Procedure: LAPAROSCOPIC CHOLECYSTECTOMY WITH INTRAOPERATIVE CHOLANGIOGRAM;  Surgeon: GArmandina Gemma MD;  Location: [facility];  Service: General;  Laterality: N/A;   CORONARY ANGIOGRAPHY N/A 03/23/2018   Procedure: CORONARY ANGIOGRAPHY;  Surgeon: ENelva Bush MD;  [facility];  Service: Cardiovascular;  Laterality: N/A;   HERNIA REPAIR  05/2002   three   KNEE ARTHROSCOPY Left 09/2002   PACEMAKER IMPLANT N/A 11/08/2018   Procedure: PACEMAKER IMPLANT;  Surgeon: CConstance Haw MD;  [facility];  Service: Cardiovascular;  Laterality: N/A;   SHOULDER SURGERY Right 2020   TOTAL KNEE ARTHROPLASTY Bilateral 09/2010   VENTRAL HERNIA REPAIR  03/2009   Allergies  Allergen Reactions   Sertraline Hcl Other (See Comments)    Tremor, HA, Decreased Libido   Current Outpatient Medications  Medication Sig Dispense Refill   acetaminophen (TYLENOL) 500 MG tablet Take 1,000 mg by mouth every 6 (six) hours as needed (pain).     aspirin EC 81 MG tablet Take 81 mg by mouth daily.     buPROPion (WELLBUTRIN XL) 150 MG 24 hr tablet Take 150 mg by mouth every morning.     COVID-19 mRNA bivalent vaccine, Pfizer, (PFIZER COVID-19 VAC BIVALENT) injection Inject into the muscle. 0.3 mL 0   COVID-19 mRNA Vac-TriS, Pfizer, (PFIZER-BIONT COVID-19 VAC-TRIS) SUSP injection Inject into the muscle. 0.3 mL 0   influenza vaccine adjuvanted (FLUAD) 0.5 ML injection Inject into the muscle. 0.5 mL 0   isosorbide mononitrate (IMDUR) 30 MG 24 hr tablet TAKE 1 TABLET ONCE DAILY. 90 tablet 0   metoprolol succinate (TOPROL-XL) 25 MG 24 hr tablet Take 0.5 tablets (12.5 mg  total) by mouth daily. 90 tablet 3   Multiple Vitamin (MULTIVITAMIN) tablet Take 1 tablet by mouth daily.     nitroGLYCERIN (NITROSTAT) 0.4 MG SL tablet Place 1 tablet (0.4 mg total) under the tongue every 5 (five) minutes as needed for chest pain. 25 tablet 3   rosuvastatin (CRESTOR) 10 MG tablet TAKE 1 TABLET ONCE DAILY. 90 tablet 0   RSV vaccine recomb adjuvanted (AREXVY) 120 MCG/0.5ML injection Inject into the muscle. 0.5 mL 0   No current facility-administered medications for this visit.    LABS:  Lab Results  Component Value Date   WBC 7.0 03/30/2021   HGB 16.0 03/30/2021   HCT 46.4 03/30/2021   MCV 92 03/30/2021   PLT 204 03/30/2021      Component Value Date/Time   NA 140 10/13/2021 0954   K 4.7 10/13/2021 0954   CL 103 10/13/2021 0954   CO2 34 (H) 10/13/2021 0954   GLUCOSE 96 10/13/2021 0954   GLUCOSE 91 10/03/2019 1516   BUN 20 10/13/2021 0954   CREATININE  0.96 10/13/2021 0954   CALCIUM 9.3 10/13/2021 0954   GFRNONAA 89 03/04/2020 1431   GFRAA 103 03/04/2020 1431   Lab Results  Component Value Date   INR 2.21 (H) 09/20/2010   INR 1.89 (H) 09/19/2010   INR 1.67 (H) 09/18/2010   No results found for: "PTT"  Social History   Socioeconomic History   Marital status: Married    Spouse name: Not on file   Number of children: Not on file   Years of education: Not on file   Highest education level: Not on file  Occupational History   Not on file  Tobacco Use   Smoking status: Former    Packs/day: 0.50    Types: Cigarettes    Quit date: 81    Years since quitting: 30.8   Smokeless tobacco: Never  Vaping Use   Vaping Use: Never used  Substance and Sexual Activity   Alcohol use: No   Drug use: No   Sexual activity: Not Currently  Other Topics Concern   Not on file  Social History Narrative   Not on file   Social Determinants of Health   Financial Resource Strain: Not on file  Food Insecurity: Not on file  Transportation Needs: Not on file   Physical Activity: Not on file  Stress: Not on file  Social Connections: Not on file  Intimate Partner Violence: Not on file   Family History  Problem Relation Age of Onset   Heart attack Father    Heart disease Father    Aneurysm Father    Breast cancer Sister    Colon cancer Sister     REVIEW OF SYSTEMS:  Reviewed with the patient as per HPI. PSYCH:  Patient denies having dental phobia.   VITAL SIGNS:  BP 132/80 (BP Location: Right Arm, Patient Position: Sitting, Cuff Size: Normal)   Pulse 80   Temp 98.9 F (37.2 C) (Oral)    PHYSICAL EXAM: GENERAL:  Well-developed, comfortable and in no apparent distress. NEUROLOGICAL:  Alert and oriented to person, place and  time. EXTRAORAL:  Facial symmetry present without any edema or erythema.  No swelling or lymphadenopathy.  TMJ asymptomatic without clicks or crepitations.  [+] Facial symmetry:  Enlargement of Left side of neck below left ear; firm to palpation. MAXIMUM INTERINCISAL OPENING:  45 mm INTRAORAL:  Soft tissues appear well-perfused and mucous membranes moist.  FOM and vestibules soft and not raised. Oral cavity without mass or lesion. No signs of infection, parulis, sinus tract, edema or erythema evident upon exam. [+] Small mandibular tori (on left side). [+] Mandibular exostoses.   DENTAL EXAM: Hard tissue exam completed and charted.    OVERALL IMPRESSION:  Fair remaining dentition.       ORAL HYGIENE:  Fair  ***INSERT TOOTH CHART***  PERIODONTAL:  Pink, healthy gingival tissue with blunted papilla.   Localized plaque and calculus accumulation. [+] Recession:  Generalized, mild CARIES:  *** DEFECTIVE RESTORATIONS: #18 fractured/broken existing amalgam restoration. OCCLUSION:  Edge-to-edge incisal bite OTHER FINDINGS:   [+] Attrition/wear:  #6 incisal, #7-#11 incisal-lingual, #22-#27 incisal [+] Abfraction(s):  #5 B(V), #10 F(V), #11 F(V)   RADIOGRAPHIC EXAM:  PAN (taken at outside dental office on 09/14/21)  and Full Mouth Series were exposed and interpreted.   Condyles seated bilaterally in fossas.  No evidence of abnormal pathology.  All visualized osseous structures appear WNL.  Missing teeth #'s 4, 12 & 13.   Localized mild *** horizontal bone loss consistent with *** .  Existing restorations ***   ASSESSMENT:  1.  Oropharyngeal cancer 2.  Pre-head and neck radiation dental evaluation 3.  Missing teeth 4.  Accretions on teeth 5.  Chronic periodontitis 6.  Attrition/wear 7.  Abfraction/flexure 8.  Mandibular tori + exostoses 9.  Malocclusion 10.  *** Caries   PROCEDURES: The common and significant side effects of radiation therapy to the head and neck were explained and discussed with the patient.  The discussion included side effects of trismus (limited opening), dysgeusia (loss of taste), xerostomia (dry mouth), radiation caries and osteoradionecrosis of the jaw.  I also discussed the importance of maintaining optimal oral hygiene and oral health before, during and after radiation to decrease the risk of developing radiation cavities and the need for any surgery such as extractions after therapy.    Upper and Lower alginate impressions taken and poured up in Type IV [employer] for fabrication of scatter protection devices.   Trismus appliance made using patient's baseline MIO which = 25 sticks.  Leta Speller, DAII demonstrated use of appliance.  Verbal and written postop instructions were given to the patient.   PLAN AND RECOMMENDATIONS: I discussed the risks, benefits, and complications of various scenarios with the patient in relationship to their medical and dental conditions, which included systemic infection or other serious issues such as osteoradionecrosis that could potentially occur either before, during or after their anticipated radiation therapy if dental/oral concerns are not addressed.  I explained that if any chronic or acute dental/oral infection(s) are addressed  and subsequently not maintained following medical optimization and recovery, their risk of the previously mentioned complications are just as high and could potentially occur postoperatively.  I explained all significant findings of the dental consultation with the patient including *** and the recommended care including *** in order to optimize them for radiation from a dental standpoint.  The patient verbalized understanding of all findings, discussion, and recommendations. We then discussed various treatment options to include no treatment, multiple extractions with alveoloplasty, pre-prosthetic surgery as indicated, periodontal therapy, dental restorations, root canal therapy, crown and bridge therapy, implant therapy, and replacement of missing teeth as indicated.  The patient verbalized understanding of all options, and currently wishes to proceed with *** .  Plan to discuss all findings and recommendations with medical team and coordinate future care as needed.   The patient will need to establish care at a dental office of his/her *** choice *** return to their regular dentist *** for routine dental care including replacement of missing teeth as needed, cleanings and exams.   All questions and concerns were invited and addressed.  The patient tolerated today's visit well and departed in stable condition.  -Sandi Mariscal, DMD

## 2022-02-22 ENCOUNTER — Other Ambulatory Visit (HOSPITAL_BASED_OUTPATIENT_CLINIC_OR_DEPARTMENT_OTHER): Payer: Self-pay

## 2022-02-22 ENCOUNTER — Other Ambulatory Visit: Payer: Self-pay

## 2022-02-22 ENCOUNTER — Ambulatory Visit: Payer: PPO | Attending: Cardiology | Admitting: Cardiology

## 2022-02-22 ENCOUNTER — Encounter: Payer: Self-pay | Admitting: Cardiology

## 2022-02-22 VITALS — BP 100/62 | HR 93 | Ht 72.0 in | Wt 279.2 lb

## 2022-02-22 DIAGNOSIS — M264 Malocclusion, unspecified: Secondary | ICD-10-CM | POA: Insufficient documentation

## 2022-02-22 DIAGNOSIS — I442 Atrioventricular block, complete: Secondary | ICD-10-CM | POA: Diagnosis not present

## 2022-02-22 DIAGNOSIS — K036 Deposits [accretions] on teeth: Secondary | ICD-10-CM | POA: Insufficient documentation

## 2022-02-22 DIAGNOSIS — K06010 Localized gingival recession, unspecified: Secondary | ICD-10-CM | POA: Insufficient documentation

## 2022-02-22 DIAGNOSIS — K08109 Complete loss of teeth, unspecified cause, unspecified class: Secondary | ICD-10-CM | POA: Insufficient documentation

## 2022-02-22 DIAGNOSIS — I495 Sick sinus syndrome: Secondary | ICD-10-CM | POA: Diagnosis not present

## 2022-02-22 DIAGNOSIS — K029 Dental caries, unspecified: Secondary | ICD-10-CM | POA: Insufficient documentation

## 2022-02-22 DIAGNOSIS — M270 Developmental disorders of jaws: Secondary | ICD-10-CM | POA: Insufficient documentation

## 2022-02-22 DIAGNOSIS — Z950 Presence of cardiac pacemaker: Secondary | ICD-10-CM

## 2022-02-22 DIAGNOSIS — M278 Other specified diseases of jaws: Secondary | ICD-10-CM | POA: Insufficient documentation

## 2022-02-22 DIAGNOSIS — K030 Excessive attrition of teeth: Secondary | ICD-10-CM | POA: Insufficient documentation

## 2022-02-22 DIAGNOSIS — K0530 Chronic periodontitis, unspecified: Secondary | ICD-10-CM | POA: Insufficient documentation

## 2022-02-22 DIAGNOSIS — C01 Malignant neoplasm of base of tongue: Secondary | ICD-10-CM

## 2022-02-22 DIAGNOSIS — K032 Erosion of teeth: Secondary | ICD-10-CM | POA: Insufficient documentation

## 2022-02-22 LAB — CUP PACEART INCLINIC DEVICE CHECK
Battery Remaining Longevity: 137 mo
Battery Voltage: 3.02 V
Brady Statistic AP VP Percent: 3.11 %
Brady Statistic AP VS Percent: 29.17 %
Brady Statistic AS VP Percent: 1.62 %
Brady Statistic AS VS Percent: 66.1 %
Brady Statistic RA Percent Paced: 32.31 %
Brady Statistic RV Percent Paced: 4.74 %
Date Time Interrogation Session: 20231017165001
Implantable Lead Implant Date: 20200702
Implantable Lead Implant Date: 20200702
Implantable Lead Location: 753859
Implantable Lead Location: 753860
Implantable Lead Model: 5076
Implantable Lead Model: 5076
Implantable Pulse Generator Implant Date: 20200702
Lead Channel Impedance Value: 380 Ohm
Lead Channel Impedance Value: 418 Ohm
Lead Channel Impedance Value: 475 Ohm
Lead Channel Impedance Value: 532 Ohm
Lead Channel Pacing Threshold Amplitude: 0.5 V
Lead Channel Pacing Threshold Amplitude: 0.875 V
Lead Channel Pacing Threshold Pulse Width: 0.4 ms
Lead Channel Pacing Threshold Pulse Width: 0.4 ms
Lead Channel Sensing Intrinsic Amplitude: 15 mV
Lead Channel Sensing Intrinsic Amplitude: 2.75 mV
Lead Channel Sensing Intrinsic Amplitude: 3.25 mV
Lead Channel Sensing Intrinsic Amplitude: 9.625 mV
Lead Channel Setting Pacing Amplitude: 1.5 V
Lead Channel Setting Pacing Amplitude: 2 V
Lead Channel Setting Pacing Pulse Width: 0.4 ms
Lead Channel Setting Sensing Sensitivity: 2.8 mV

## 2022-02-22 MED ORDER — COVID-19 MRNA VAC-TRIS(PFIZER) 30 MCG/0.3ML IM SUSY
PREFILLED_SYRINGE | INTRAMUSCULAR | 0 refills | Status: DC
  Filled 2022-02-22: qty 0.3, 1d supply, fill #0

## 2022-02-22 NOTE — Progress Notes (Signed)
Electrophysiology Office Note   Date:  02/22/2022   ID:  Clarence Andrade, DOB [birth year], MRN [id number]  PCP:  Kathalene Frames, MD  Cardiologist:  Radford Pax Primary Electrophysiologist:  Thomes Burak Meredith Leeds, MD    No chief complaint on file.     History of Present Illness: Clarence Andrade is a 76 y.o. male who is being seen today for the evaluation of heart block at the request of Clarence Andrade. Presenting today for electrophysiology evaluation.    He has a history significant for mild aortic stenosis, coronary artery disease with CTO of the LAD, hyperlipidemia, OSA.  He has intermittent heart block.  He is post Medtronic dual-chamber pacemaker implanted 11/08/2018.  He has been diagnosed with squamous cell carcinoma at the base of his tongue.  He has plans for radiation therapy.  Today, denies symptoms of palpitations, chest pain, shortness of breath, orthopnea, PND, lower extremity edema, claudication, dizziness, presyncope, syncope, bleeding, or neurologic sequela. The patient is tolerating medications without difficulties.     Past Medical History:  Diagnosis Date   Abdominal hernia    Midline   Adrenal incidentaloma ([place]) 2010   Aortic stenosis    Ascending aortic aneurysm ([place])    a. 4.4cm by imaging 03/2020.   CAD (coronary artery disease)    a. Cath 03/2018 - CTO of the prox-mid LAD with bridging, L-L and R-L collaterals, moderate, non-obstructive disease involving the proximal LAD, proximal LCx, and distal RCA, more severe disease noted involving small branch vessels (RV marginal and superior branch of OM3).   Complete heart block (HCC)    Gallstones    Heart murmur    History of pacemaker    a. 11/2018 - Medtronic, intermittent CHB.   Hyperlipidemia    Morbid obesity (HCC)    Myocardial infarction [facility])    NSVT (nonsustained ventricular tachycardia) (HCC)    OSA (obstructive sleep apnea)    Severe PSG 1/07 AHI 66/hr, O2 Nadir 50% Refuses treatment - Pt does  not believe test was accurate   Pericarditis    Pneumonia    Pulmonary HTN (HCC)    mild with PASP 23mHg by echo 02/2018, not seen on 03/2020 echo   Pulmonary nodule    Screening for AAA (abdominal aortic aneurysm) 2010   CT Abd/Pelvis   Ventral hernia    03/2009   Past Surgical History:  Procedure Laterality Date   CHOLECYSTECTOMY N/A 10/11/2019   Procedure: LAPAROSCOPIC CHOLECYSTECTOMY WITH INTRAOPERATIVE CHOLANGIOGRAM;  Surgeon: GArmandina Gemma MD;  Location: [facility];  Service: General;  Laterality: N/A;   CORONARY ANGIOGRAPHY N/A 03/23/2018   Procedure: CORONARY ANGIOGRAPHY;  Surgeon: ENelva Bush MD;  [facility];  Service: Cardiovascular;  Laterality: N/A;   HERNIA REPAIR  05/2002   three   KNEE ARTHROSCOPY Left 09/2002   PACEMAKER IMPLANT N/A 11/08/2018   Procedure: PACEMAKER IMPLANT;  Surgeon: CConstance Haw MD;  [facility];  Service: Cardiovascular;  Laterality: N/A;   SHOULDER SURGERY Right 2020   TOTAL KNEE ARTHROPLASTY Bilateral 09/2010   VENTRAL HERNIA REPAIR  03/2009     Current Outpatient Medications  Medication Sig Dispense Refill   acetaminophen (TYLENOL) 500 MG tablet Take 1,000 mg by mouth every 6 (six) hours as needed (pain).     aspirin EC 81 MG tablet Take 81 mg by mouth daily.     buPROPion (WELLBUTRIN XL) 150 MG 24 hr tablet Take 150 mg by mouth every morning.  COVID-19 mRNA bivalent vaccine, Pfizer, (PFIZER COVID-19 VAC BIVALENT) injection Inject into the muscle. 0.3 mL 0   COVID-19 mRNA Vac-TriS, Pfizer, (PFIZER-BIONT COVID-19 VAC-TRIS) SUSP injection Inject into the muscle. 0.3 mL 0   COVID-19 mRNA vaccine 2023-2024 (COMIRNATY) syringe Inject into the muscle. 0.3 mL 0   influenza vaccine adjuvanted (FLUAD) 0.5 ML injection Inject into the muscle. 0.5 mL 0   isosorbide mononitrate (IMDUR) 30 MG 24 hr tablet TAKE 1 TABLET ONCE DAILY. 90 tablet 0   metoprolol succinate (TOPROL-XL) 25 MG 24 hr tablet Take 0.5  tablets (12.5 mg total) by mouth daily. 90 tablet 3   Multiple Vitamin (MULTIVITAMIN) tablet Take 1 tablet by mouth daily.     nitroGLYCERIN (NITROSTAT) 0.4 MG SL tablet Place 1 tablet (0.4 mg total) under the tongue every 5 (five) minutes as needed for chest pain. 25 tablet 3   rosuvastatin (CRESTOR) 10 MG tablet TAKE 1 TABLET ONCE DAILY. 90 tablet 0   RSV vaccine recomb adjuvanted (AREXVY) 120 MCG/0.5ML injection Inject into the muscle. 0.5 mL 0   No current facility-administered medications for this visit.    Allergies:   Sertraline hcl   Social History:  The patient  reports that he quit smoking about 30 years ago. His smoking use included cigarettes. He smoked an average of .5 packs per day. He has never used smokeless tobacco. He reports that he does not drink alcohol and does not use drugs.   Family History:  The patient's family history includes Aneurysm in his father; Breast cancer in his sister; Colon cancer in his sister; Heart attack in his father; Heart disease in his father.   ROS:  Please see the history of present illness.   Otherwise, review of systems is positive for none.   All other systems are reviewed and negative.   PHYSICAL EXAM: VS:  BP 100/62   Pulse 93   Ht 6' (1.829 m)   Wt 279 lb 3.2 oz (126.6 kg)   SpO2 93%   BMI 37.87 kg/m  , BMI Body mass index is 37.87 kg/m. GEN: Well nourished, well developed, in no acute distress  HEENT: normal  Neck: no JVD, carotid bruits, or masses Cardiac: RRR; no murmurs, rubs, or gallops,no edema  Respiratory:  clear to auscultation bilaterally, normal work of breathing GI: soft, nontender, nondistended, + BS MS: no deformity or atrophy  Skin: warm and dry, device site well healed Neuro:  Strength and sensation are intact Psych: euthymic mood, full affect  EKG:  EKG is ordered today. Personal review of the ekg ordered shows sinus rhythm, rate 93  Personal review of the device interrogation today. Results in [place]: 03/30/2021: ALT 18; Hemoglobin 16.0; Magnesium 2.3; Platelets 204 10/13/2021: BUN 20; Creatinine, Ser 0.96; Potassium 4.7; Sodium 140 02/17/2022: TSH 0.886    Lipid Panel     Component Value Date/Time   CHOL 130 03/30/2021 1119   TRIG 133 03/30/2021 1119   HDL 46 03/30/2021 1119   CHOLHDL 2.8 03/30/2021 1119   CHOLHDL 3.2 06/05/2010 0715   VLDL 10 06/05/2010 0715   LDLCALC 61 03/30/2021 1119     Wt Readings from Last 3 Encounters:  02/22/22 279 lb 3.2 oz (126.6 kg)  02/18/22 276 lb 3.2 oz (125.3 kg)  02/17/22 281 lb (127.5 kg)      Other studies Reviewed: Additional studies/ records that were reviewed today include: TTE 03/08/2018 Review of the above records today demonstrates:  Left ventricle: The  cavity size was normal. There was severe   concentric hypertrophy. Systolic function was normal. The   estimated ejection fraction was in the range of 55% to 60%. Wall   motion was normal; there were no regional wall motion   abnormalities. There was an increased relative contribution of   atrial contraction to ventricular filling. Doppler parameters are   consistent with abnormal left ventricular relaxation (grade 1   diastolic dysfunction). - Aortic valve: Moderately calcified annulus. Trileaflet; mildly   thickened, mildly calcified leaflets. There was mild stenosis.   There was trivial regurgitation. Mean gradient (S): 15 mm Hg.   Valve area (VTI): 1.53 cm^2. Valve area (Vmax): 1.48 cm^2. Valve   area (Vmean): 1.46 cm^2. - Aorta: Ascending aorta diameter: 41 mm (ED). - Ascending aorta: The ascending aorta was mildly dilated. - Mitral valve: Calcified annulus. There was mild regurgitation. - Left atrium: The atrium was mildly dilated. - Pulmonic valve: There was mild regurgitation. - Pulmonary arteries: PA peak pressure: 39 mm Hg (S).  LHC 03/23/18 Severe single-vessel coronary artery disease, with chronic total occlusion of the proximal to mid LAD with  bridging, left-to-left, and right-to-left collaterals. Moderate, non-obstructive disease involving the proximal LAD, proximal LCx, and distal RCA.  More severe disease noted involving small branch vessels (RV marginal and superior branch of OM3). Inability to cross the aortic valve for left heart catheterization due to insufficient catheter length complicated by tortuous subclavian artery and dilated ascending aorta.  LVEF noted to be normal on recent echocardiogram.  ASSESSMENT AND PLAN:  1.  Manage complete heart block: Status post Medtronic dual-chamber pacemaker implanted 11/08/2018.  Device functioning appropriately.  No changes at this time.  He has plan for radiation therapy for squamous cell carcinoma at the base of his tongue.  This should not affect his pacemaker.  2.  Coronary disease: Status post CTO of the LAD.  No chest pain.  Continue with current management.  3.  Hyperlipidemia: Goal LDL less than 70.  Continue Crestor per primary cardiology.   Current medicines are reviewed at length with the patient today.   The patient does not have concerns regarding his medicines.  The following changes were made today: None  Labs/ tests ordered today include:  No orders of the defined types were placed in this encounter.    Disposition:   FU 5 months  Signed, Raegen Tarpley Meredith Leeds, MD  02/22/2022 4:45 PM     [address] [place] [address] [phone] (office) [phone] (fax)

## 2022-02-22 NOTE — Progress Notes (Signed)
TO BE COMPLETED BY RADIATION ONCOLOGIST OFFICE:   Patient Name: Clarence Andrade   Date of Birth: 29-Jan-1946   Radiation Oncologist:  Dr. [name]  Site to be Treated: Oropharynx (base of tongue and neck lymph nodes)   Will x-rays >10 MV be used? No  Will the radiation be >10 cm from the device? yes  Planned Treatment Start Date:  2 weeks  TO BE COMPLETED BY CARDIOLOGIST OFFICE:   Device Information:  Pacemaker '[x]'$      ICD '[]'$    Brand: Medtronic: [phone] Model #: Medtronic [id number] Santina Evans DR MRI  Serial Number: [id number]   Date of Placement: 11/08/2018  Site of Placement: left upper chest  Remote Device Check--Frequency: 91 days   Last Check: 02/22/2022 in office  Is the Patient Pacer Dependent?:  Yes '[]'$   No '[x]'$   Does cardiologist request Radiation Oncology to schedule device testing by vendor for the following:  Prior to the Initiation of Treatments?  Yes '[]'$  No '[x]'$  During Treatments?  Yes '[]'$  No '[x]'$  Post Radiation Treatments?  Yes '[]'$  No '[x]'$   Is device monitoring necessary by vendor/cardiologist team during treatments?  Yes '[]'$   No '[x]'$   Is cardiac monitoring by Radiation Oncology nursing necessary during treatments? Yes '[x]'$   No '[]'$   Do you recommend device be relocated prior to Radiation Treatment? Yes '[]'$   No '[x]'$   **PLEASE LIST ANY NOTES OR SPECIAL REQUESTS:       CARDIOLOGIST SIGNATURE:  Dr. Allegra Lai Per [facility] Standing Orders, Damian Leavell  02/22/2022 4:53 PM  **Please route completed form back to Radiation Oncology Nursing and "[place]", OR send an update if there will be a delay in having form completed by expected start date.  **Call [phone] if you have any questions or do not get an in-basket response from a Radiation Oncology staff member

## 2022-02-23 ENCOUNTER — Other Ambulatory Visit: Payer: Self-pay

## 2022-02-23 ENCOUNTER — Other Ambulatory Visit: Payer: Self-pay | Admitting: Hematology and Oncology

## 2022-02-23 DIAGNOSIS — C01 Malignant neoplasm of base of tongue: Secondary | ICD-10-CM | POA: Diagnosis not present

## 2022-02-23 DIAGNOSIS — C109 Malignant neoplasm of oropharynx, unspecified: Secondary | ICD-10-CM | POA: Diagnosis not present

## 2022-02-23 MED ORDER — DEXAMETHASONE 4 MG PO TABS: ORAL_TABLET | ORAL | 1 refills | Status: DC

## 2022-02-23 MED ORDER — PROCHLORPERAZINE MALEATE 10 MG PO TABS: 10.0000 mg | ORAL_TABLET | Freq: Four times a day (QID) | ORAL | 1 refills | Status: DC | PRN

## 2022-02-23 MED ORDER — LIDOCAINE-PRILOCAINE 2.5-2.5 % EX CREA: TOPICAL_CREAM | CUTANEOUS | 3 refills | Status: DC

## 2022-02-23 MED ORDER — ONDANSETRON HCL 8 MG PO TABS: 8.0000 mg | ORAL_TABLET | Freq: Three times a day (TID) | ORAL | 1 refills | Status: DC | PRN

## 2022-02-23 NOTE — Therapy (Addendum)
OUTPATIENT PHYSICAL THERAPY HEAD AND NECK BASELINE EVALUATION   Patient Name: Clarence Andrade MRN: [id number] DOB:1946-04-02, 76 y.o., male Today's Date: 02/24/2022   PT End of Session - 02/24/22 1114     Visit Number 1    Number of Visits 2    Date for PT Re-Evaluation 05/19/22    PT Start Time 2542    PT Stop Time 1112    PT Time Calculation (min) 32 min    Activity Tolerance Patient tolerated treatment well    Behavior During Therapy [facility] for tasks assessed/performed             Past Medical History:  Diagnosis Date   Abdominal hernia    Midline   Adrenal incidentaloma ([place]) 2010   Aortic stenosis    Ascending aortic aneurysm ([place])    a. 4.4cm by imaging 03/2020.   CAD (coronary artery disease)    a. Cath 03/2018 - CTO of the prox-mid LAD with bridging, L-L and R-L collaterals, moderate, non-obstructive disease involving the proximal LAD, proximal LCx, and distal RCA, more severe disease noted involving small branch vessels (RV marginal and superior branch of OM3).   Complete heart block (HCC)    Gallstones    Heart murmur    History of pacemaker    a. 11/2018 - [employer]tronic, intermittent CHB.   Hyperlipidemia    Morbid obesity (HCC)    Myocardial infarction [facility])    NSVT (nonsustained ventricular tachycardia) (HCC)    OSA (obstructive sleep apnea)    Severe PSG 1/07 AHI 66/hr, O2 Nadir 50% Refuses treatment - Pt does not believe test was accurate   Pericarditis    Pneumonia    Pulmonary HTN (HCC)    mild with PASP 78mHg by echo 02/2018, not seen on 03/2020 echo   Pulmonary nodule    Screening for AAA (abdominal aortic aneurysm) 2010   CT Abd/Pelvis   Ventral hernia    03/2009   Past Surgical History:  Procedure Laterality Date   CHOLECYSTECTOMY N/A 10/11/2019   Procedure: LAPAROSCOPIC CHOLECYSTECTOMY WITH INTRAOPERATIVE CHOLANGIOGRAM;  Surgeon: GArmandina Gemma MD;  Location: [facility];  Service: General;  Laterality: N/A;   CORONARY ANGIOGRAPHY N/A 03/23/2018    Procedure: CORONARY ANGIOGRAPHY;  Surgeon: ENelva Bush MD;  [facility];  Service: Cardiovascular;  Laterality: N/A;   HERNIA REPAIR  05/2002   three   KNEE ARTHROSCOPY Left 09/2002   PACEMAKER IMPLANT N/A 11/08/2018   Procedure: PACEMAKER IMPLANT;  Surgeon: CConstance Haw MD;  [facility];  Service: Cardiovascular;  Laterality: N/A;   SHOULDER SURGERY Right 2020   TOTAL KNEE ARTHROPLASTY Bilateral 09/2010   VENTRAL HERNIA REPAIR  03/2009   Patient Active Problem List   Diagnosis Date Noted   Exostosis of jaw 02/22/2022   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   Teeth missing 02/22/2022   Accretions on teeth 02/22/2022   Chronic periodontitis 02/22/2022   Gingival recession, localized 02/22/2022   Excessive dental attrition 02/22/2022   Abfraction 02/22/2022   Malocclusion 02/22/2022   Incipient enamel caries 02/22/2022   Encounter for preoperative dental examination 02/21/2022   Malignant neoplasm of base of tongue ([place] 02/18/2022   Squamous cell carcinoma of oropharynx ([place] 02/17/2022   Cholelithiasis with chronic cholecystitis 10/05/2019   Stable angina 03/23/2018   Abnormal stress test 03/23/2018   Aortic stenosis    Pulmonary HTN (HCC)    Exertional chest pain 03/07/2018   Coronary artery calcification seen on CAT scan 07/03/2013  Heart murmur 07/03/2013   Pericarditis 06/12/2013    PCP: [name], MD  REFERRING PROVIDER: [name], MD  REFERRING DIAG: C01 (ICD-10-CM) - Malignant neoplasm of base of tongue ([place])  THERAPY DIAG:  Abnormal posture  Malignant neoplasm of base of tongue ([place])  Rationale for Evaluation and Treatment Rehabilitation  ONSET DATE: 02/10/22  SUBJECTIVE    SUBJECTIVE STATEMENT: Patient reports they are here today to be seen by their medical team for newly diagnosed cancer of oropharynx.    PERTINENT HISTORY:  SCC of his Oropharynx, stage 1 (cT2, N1, M0, p 16+) He presented to [name] [facility]) on 01/26/22 with complaints of a neck lump present for 1-2 months. During this consultation he reported a tight feeling in his throat and difficulty swallowing solids, liquids, pills. 01/26/22 Laryngoscopy with Dr. Sabino Gasser revealed an exophytic friable tumor at least 2-3 cm in diameter involving the base on the tongue, extending across the midline. Exam also showed a left level 2 lymph node fixed to underlying soft tissues. Overall, findings were notably suspicious for aggressive oropharyngeal squamous cell carcinoma. 02/02/22 CT neck showed a 4 cm mainly exophytic mass centered at the left base of the tongue, with at least 1 ipsilateral malignant node measuring nearly 4 cm with signs of extracapsular extension. Findings were overall consistent with metastatic squamous cell carcinoma. 02/10/22 Biopsy of enlarged left neck node revealed: moderately differentiated squamous cell carcinoma; p16 positive. Diagnostic comment also noted the lymph node as presumably entirely replaced by carcinoma. 02/18/22 PET showed no evidence of metastatic disease. He will receive 35 fractions of radiation to his Oropharynx and bilateral neck with weekly chemotherapy. CT simulation scheduled for 03/01/22. He will start the week of 03/07/22.  PATIENT GOALS   to be educated about the signs and symptoms of lymphedema and learn post op HEP.   PAIN:  Are you having pain? Yes: NPRS scale: 2/10 Pain location: L side of throat and in to ear Pain description: twinge, sharp, achy Aggravating factors: lying down Relieving factors: sitting up   PRECAUTIONS: Active CA, Pacemaker, Joint replacement, and Comment (bilateral TKA)  WEIGHT BEARING RESTRICTIONS No  FALLS:  Has patient fallen in last 6 months? No Does the patient have a fear of falling that limits activity? 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  LEISURE: water aerobics 5 days/wk for an hour  PRIOR LEVEL OF FUNCTION: Independent   OBJECTIVE  COGNITION:           Overall cognitive status: Within functional limits for tasks assessed                  POSTURE:  Forward head and rounded shoulders posture   30 SEC SIT TO STAND: 12 reps in 30 sec without use of UEs which is  Average for patient's age   SHOULDER AROM:   WFL    CERVICAL AROM:   Percent limited  Flexion WFL  Extension 50% limited  Right lateral flexion 50% limited  Left lateral flexion 50% limited  Right rotation 50% limited  Left rotation 50% limited    (Blank rows=not tested)   GAIT:  Assessed: Yes Assistance needed: Independent  Ambulation Distance: 10 feet  Assistive Device: none Gait pattern:  WFL Ambulation surface: Level  PATIENT EDUCATION:  Education details: Neck ROM, importance of posture when sitting, standing and lying down, deep breathing, walking program and importance of staying active throughout treatment, CURE article on staying active, "Why exercise?" flyer, lymphedema and PT info Person educated: Patient Education method: Explanation, Demonstration, Handout Education comprehension: Patient verbalized understanding and returned demonstration   HOME EXERCISE PROGRAM: Patient was instructed today in a home exercise program today for head and neck range of motion exercises. These included active cervical flexion, active cervical extension, active cervical rotation to each direction, upper trap stretch, and shoulder retraction. Patient was encouraged to do these 2-3 times a day, holding for 5 sec each and completing for 5 reps. Pt was educated that once this becomes easier then hold the stretches for 30-60 seconds.    ASSESSMENT:  CLINICAL IMPRESSION: Pt arrives to PT  with recently diagnosed cancer of oropharynx. He will receive 35 fractions of radiation to his Oropharynx and bilateral neck with weekly chemotherapy. CT simulation scheduled for 03/01/22. He will start the week of 03/07/22.  Pt's cervical ROM was quite limited at baseline (50% limited throughout).  Educated pt about signs and symptoms of lymphedema as well as anatomy and physiology of lymphatic system. Educated pt in importance of staying as active as possible throughout treatment to decrease fatigue as well as head and neck ROM exercises to decrease loss of ROM. Will see pt after completion of radiation to reassess ROM and assess for lymphedema and to determine therapy needs at that time.  Pt will benefit from skilled therapeutic intervention to improve on the following deficits: Decreased knowledge of precautions and postural dysfunction.   PT treatment/interventions: ADL/self-care home management, pt/family education, therapeutic exercise. Other interventions   REHAB POTENTIAL: Good  CLINICAL DECISION MAKING: Stable/uncomplicated  EVALUATION COMPLEXITY: Low   GOALS: Goals reviewed with patient? YES  LONG TERM GOALS: (STG=LTG)   Name Target Date  Goal status  1 Patient will be able to verbalize understanding of a home exercise program for cervical range of motion, posture, and walking.   Baseline:  No knowledge 02/23/2022 Achieved at eval  2 Patient will be able to verbalize understanding of proper sitting and standing posture. Baseline:  No knowledge 02/23/2022 Achieved at eval  3 Patient will be able to verbalize understanding of lymphedema risk and availability of treatment for this condition Baseline:  No knowledge 02/23/2022 Achieved at eval  4 Pt will demonstrate a return to full cervical ROM and function post operatively compared to baselines and not demonstrate any signs or symptoms of lymphedema.  Baseline: See objective measurements taken today. 05/19/2022 New     PLAN: PT  FREQUENCY/DURATION: EVAL and 1 follow up appointment.   PLAN FOR NEXT SESSION: will reassess 2 weeks after completion of radiation to determine needs.  Patient will follow up at outpatient cancer rehab 2 weeks after completion of radiation.  If the patient requires physical therapy at that time, a specific plan will be dictated and sent to the referring physician for approval. The patient was educated today on appropriate basic range of motion exercises to begin now and continue throughout radiation and educated on the signs and symptoms of lymphedema. Patient verbalized good understanding.     Physical Therapy Information for During and After Head/Neck Cancer Treatment: Lymphedema is a swelling condition that you may be at risk for in your neck and/or face if you have radiation treatment to the area and/or if you have surgery that includes removing  lymph nodes.  There is treatment available for this condition and it is not life-threatening.  Contact your physician or physical therapist with concerns. An excellent resource for those seeking information on lymphedema is the National Lymphedema Network's website.  It can be accessed at [facility] If you notice swelling in your neck or face at any time following surgery (even if it is many years from now), please contact your doctor or physical therapist to discuss this.  Lymphedema can be treated at any time but it is easier for you if it is treated early on. If you have had surgery to your neck, please check with your surgeon about how soon to start doing neck range of motion exercises.  If you are not having surgery, I encourage you to start doing neck range of motion exercises today and continue these while undergoing treatment, UNLESS you have irritation of your skin or soft tissue that is aggravated by doing them.  These exercises are intended to help you prevent loss of range of motion and/or to gain range of motion in your neck (which can be  limited by tightening effects of radiation), and NOT to aggravate these tissues if they develop sensitivities from treatment. Neck range of motion exercises should be done to the point of feeling a GENTLE, TOLERABLE stretch only.  You are encouraged to start a walking or other exercise program tomorrow and continue this as much as you are able through and after treatment.  Please feel free to call me with any questions. Manus Gunning, PT, CLT Physical Therapist and Certified Lymphedema Therapist [facility] [address], [place], [place] [address] [phone] [email or web address]  WALKING  Walking is a great form of exercise to increase your strength, endurance and overall fitness.  A walking program can help you start slowly and gradually build endurance as you go.  Everyone's ability is different, so each person's starting point will be different.  You do not have to follow them exactly.  The are just samples. You should simply find out what's right for you and stick to that program.   In the beginning, you'll start off walking 2-3 times a day for short distances.  As you get stronger, you'll be walking further at just 1-2 times per day.  A. You Can Walk For A Certain Length Of Time Each Day    Walk 5 minutes 3 times per day.  Increase 2 minutes every 2 days (3 times per day).  Work up to 25-30 minutes (1-2 times per day).   Example:   Day 1-2 5 minutes 3 times per day   Day 7-8 12 minutes 2-3 times per day   Day 13-14 25 minutes 1-2 times per day  B. You Can Walk For a Certain Distance Each Day     Distance can be substituted for time.    Example:   3 trips to mailbox (at road)   3 trips to corner of block   3 trips around the block  C. Go to local high school and use the track.    Walk for distance ____ around track  Or time ____ minutes  D. Walk ____ Jog ____ Run ___   Why exercise?  So many benefits! Here are  SOME of them: Heart health, including raising your good cholesterol level and reducing heart rate and blood pressure Lung health, including improved lung capacity It burns fats, and most of [place] can stand to be leaner, whether or  not we are overweight. It increases the body's natural painkillers and mood elevators, so makes you feel better. Not only makes you feel better, but look better too Improves sleep Takes a bite out of stress May decrease your risk of many types of cancer If you are currently undergoing cancer treatment, exercise may improve your ability to tolerate treatments including chemotherapy. For everybody, it can improve your energy level. Those with cancer-related fatigue report a 40-50% reduction in this symptom when exercising regularly. If you are a survivor of breast, colon, or prostate cancer, it may decrease your risk of a recurrence. (This may hold for other cancers too, but so far we have data just for these three types.)  How to exercise: Get your doctor's okay. Pick something you enjoy doing, like walking, Zumba, biking, swimming, or whatever. Start at low intensity and time, then gradually increase.  (See walking program handout.) Set a goal to achieve over time.  The American Cancer Society, American Heart Association, and U.S. Dept. of Health and Human Services recommend 150 minutes of moderate exercise, 75 minutes of vigorous exercise, or a combination of both per week. This should be done in episodes at least 10 minutes long, spread throughout the week.  Need help being motivated? Pick something you enjoy doing, because you'll be more inclined to stick with that activity than something that feels like a chore. Do it with a friend so that you are accountable to each other. Schedule it into your day. Place it on your calendar and keep that appointment just like you do any appointment that you make. Join an exercise group that meets at a specific time.  That way, you  have to show up on time, and that makes it harder to procrastinate about doing your workout.  It also keeps you accountable--people begin to expect you to be there. Join a gym where you feel comfortable and not intimidated, at the right cost. Sign up for something that you'll need to be in shape for on a specific date, like a 1K or a 5K to walk or run, a 20 or 30 mile bike ride, a mud run or something like that. If the date is looming, you know you'll need to train to be ready for it.  An added benefit is that many of these are fundraisers for good causes. If you've already paid for a gym membership, group exercise class or event, you might as well work out, so you haven't wasted your money!    Allyson Sabal [place], PT 02/24/2022, 11:15 AM       PHYSICAL THERAPY DISCHARGE SUMMARY  Visits from Start of Care: 1  Current functional level related to goals / functional outcomes: See above   Remaining deficits: See above   Education / Equipment: HEP, lymphedema education   Patient agrees to discharge. Patient goals were not met. Patient is being discharged due to not returning since the last visit.  Allyson Sabal [place], [place] 07/20/22 12:34 PM

## 2022-02-23 NOTE — Progress Notes (Signed)
START ON PATHWAY REGIMEN - Head and Neck     A cycle is every 7 days:     Cisplatin   **Always confirm dose/schedule in your pharmacy ordering system**  Patient Characteristics: Oropharynx, HPV Positive, Preoperative or Nonsurgical Candidate (Clinical Staging), cT0-4, cN1-3 or cT3-4, cN0 Disease Classification: Oropharynx HPV Status: Positive (+) Therapeutic Status: Preoperative or Nonsurgical Candidate (Clinical Staging) AJCC T Category: cT3 AJCC 8 Stage Grouping: II AJCC N Category: cN1 AJCC M Category: cM0 Intent of Therapy: Curative Intent, Discussed with Patient 

## 2022-02-23 NOTE — Addendum Note (Signed)
Addended by: Michelle Nasuti on: 02/23/2022 07:50 AM   Modules accepted: Orders

## 2022-02-24 ENCOUNTER — Encounter: Payer: Self-pay | Admitting: Radiology

## 2022-02-24 ENCOUNTER — Other Ambulatory Visit: Payer: Self-pay

## 2022-02-24 ENCOUNTER — Ambulatory Visit: Payer: PPO | Attending: Radiation Oncology | Admitting: Physical Therapy

## 2022-02-24 ENCOUNTER — Other Ambulatory Visit: Payer: Self-pay | Admitting: Hematology and Oncology

## 2022-02-24 ENCOUNTER — Inpatient Hospital Stay: Payer: PPO | Admitting: Radiology

## 2022-02-24 ENCOUNTER — Encounter: Payer: Self-pay | Admitting: Physical Therapy

## 2022-02-24 ENCOUNTER — Telehealth: Payer: Self-pay | Admitting: Dietician

## 2022-02-24 ENCOUNTER — Ambulatory Visit
Admission: RE | Admit: 2022-02-24 | Discharge: 2022-02-24 | Disposition: A | Discharge status: Home / Self Care | Payer: PPO | Source: Ambulatory Visit | Attending: Pediatrics | Admitting: Pediatrics

## 2022-02-24 ENCOUNTER — Other Ambulatory Visit: Payer: Self-pay | Admitting: *Deleted

## 2022-02-24 ENCOUNTER — Ambulatory Visit: Payer: PPO | Attending: Neurology

## 2022-02-24 ENCOUNTER — Inpatient Hospital Stay: Payer: PPO | Admitting: Licensed Clinical Social Worker

## 2022-02-24 DIAGNOSIS — Z006 Encounter for examination for normal comparison and control in clinical research program: Secondary | ICD-10-CM

## 2022-02-24 DIAGNOSIS — Z87891 Personal history of nicotine dependence: Secondary | ICD-10-CM | POA: Diagnosis not present

## 2022-02-24 DIAGNOSIS — R293 Abnormal posture: Secondary | ICD-10-CM | POA: Diagnosis not present

## 2022-02-24 DIAGNOSIS — C01 Malignant neoplasm of base of tongue: Secondary | ICD-10-CM | POA: Diagnosis not present

## 2022-02-24 DIAGNOSIS — C109 Malignant neoplasm of oropharynx, unspecified: Secondary | ICD-10-CM

## 2022-02-24 DIAGNOSIS — R1310 Dysphagia, unspecified: Secondary | ICD-10-CM | POA: Insufficient documentation

## 2022-02-24 LAB — BUN & CREATININE (CHCC)
BUN: 16 mg/dL (ref 8–23)
Creatinine: 0.97 mg/dL (ref 0.61–1.24)
GFR, Estimated: >60 mL/min (ref >60)

## 2022-02-24 LAB — RESEARCH LABS

## 2022-02-24 NOTE — Progress Notes (Signed)
Oncology Nurse Navigator Documentation   I met with Clarence Andrade and his wife before and during his appointments during the Head and Neck clinic today. He is doing well. I've gone over all his upcoming appointments that are currently scheduled. He is also aware that he will have his radiation appointments scheduled, chemo education and future infusion/MD/lab scheduled ASAP. They are both aware that they can call me at any time with questions or concerns.  Harlow Asa RN, BSN, OCN Head & Neck Oncology Nurse [place] at [facility] Phone # [phone]  Fax # [phone]

## 2022-02-24 NOTE — Progress Notes (Signed)
[place] Work  Initial Assessment   Clarence Andrade is a 76 y.o. year old male accompanied by wife, Clarence Andrade. Clinical Social Work was referred by nurse navigator/ [facility] for assessment of psychosocial needs.   SDOH (Social Determinants of Health) assessments performed: Yes SDOH Interventions    Flowsheet Row Clinical Support from 02/24/2022 in [facility]  SDOH Interventions   Food Insecurity Interventions Intervention Not Indicated  Housing Interventions Intervention Not Indicated  Transportation Interventions Intervention Not Indicated  Utilities Interventions Intervention Not Indicated  Financial Strain Interventions Intervention Not Indicated       SDOH Screenings   Food Insecurity: No Food Insecurity (02/24/2022)  Housing: Low Risk  (02/24/2022)  Transportation Needs: No Transportation Needs (02/24/2022)  Utilities: Not At Risk (02/24/2022)  Financial Resource Strain: Low Risk  (02/24/2022)  Tobacco Use: Medium Risk (02/24/2022)     Distress Screen completed: No     No data to display            Family/Social Information:  Housing Arrangement: patient lives with wife Family members/support persons in your life? 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  Income source: Employment and retirement Development worker, international aid concerns: No Type of concern: None Food access concerns: no Religious or spiritual practice: Not known Services Currently in place:  n/a  Coping/ Adjustment to diagnosis: Patient understands treatment plan and what happens next? Yes. They also have a friend that went through this treatment about 8 years ago and have spoken with him Concerns about diagnosis and/or treatment:  adjusting exercise routine and if he can continue work routine Patient enjoys time with family/ friends and his work Current coping  skills/ strengths: Active sense of humor , Capable of independent living , Armed forces logistics/support/administrative officer , Motivation for treatment/growth , and Supportive family/friends     SUMMARY: Current SDOH Barriers:  None noted today  Clinical Social Work Clinical Goal(s):  No clinical social work goals at this time  Interventions: Discussed common feeling and emotions when being diagnosed with cancer, and the importance of support during treatment Informed patient of the support team roles and support services at [facility] Provided [place] contact information and encouraged patient to call with any questions or concerns   Follow Up Plan: Patient will contact CSW with any support or resource needs Patient verbalizes understanding of plan: Yes    Clarence Andrade E Clarence Savant, LCSW

## 2022-02-24 NOTE — Therapy (Addendum)
OUTPATIENT SPEECH LANGUAGE PATHOLOGY ONCOLOGY EVALUATION   Patient Name: Clarence Andrade MRN: [id number] DOB:1945-11-19, 76 y.o., male Today's Date: 02/24/2022  PCP: Lajean Manes, MD REFERRING PROVIDER: [name], MD   End of Session - 02/24/22 1324     Visit Number 1    Number of Visits 7    Date for SLP Re-Evaluation 05/25/22    SLP Start Time 1117    SLP Stop Time  1150    SLP Time Calculation (min) 33 min    Activity Tolerance Patient tolerated treatment well             Past Medical History:  Diagnosis Date   Abdominal hernia    Midline   Adrenal incidentaloma ([place]) 2010   Aortic stenosis    Ascending aortic aneurysm ([place])    a. 4.4cm by imaging 03/2020.   CAD (coronary artery disease)    a. Cath 03/2018 - CTO of the prox-mid LAD with bridging, L-L and R-L collaterals, moderate, non-obstructive disease involving the proximal LAD, proximal LCx, and distal RCA, more severe disease noted involving small branch vessels (RV marginal and superior branch of OM3).   Complete heart block (HCC)    Gallstones    Heart murmur    History of pacemaker    a. 11/2018 - Medtronic, intermittent CHB.   Hyperlipidemia    Morbid obesity (HCC)    Myocardial infarction [facility])    NSVT (nonsustained ventricular tachycardia) (HCC)    OSA (obstructive sleep apnea)    Severe PSG 1/07 AHI 66/hr, O2 Nadir 50% Refuses treatment - Pt does not believe test was accurate   Pericarditis    Pneumonia    Pulmonary HTN (HCC)    mild with PASP 9mHg by echo 02/2018, not seen on 03/2020 echo   Pulmonary nodule    Screening for AAA (abdominal aortic aneurysm) 2010   CT Abd/Pelvis   Ventral hernia    03/2009   Past Surgical History:  Procedure Laterality Date   CHOLECYSTECTOMY N/A 10/11/2019   Procedure: LAPAROSCOPIC CHOLECYSTECTOMY WITH INTRAOPERATIVE CHOLANGIOGRAM;  Surgeon: GArmandina Gemma MD;  Location: [facility];  Service: General;  Laterality: N/A;   CORONARY ANGIOGRAPHY N/A 03/23/2018    Procedure: CORONARY ANGIOGRAPHY;  Surgeon: ENelva Bush MD;  [facility];  Service: Cardiovascular;  Laterality: N/A;   HERNIA REPAIR  05/2002   three   KNEE ARTHROSCOPY Left 09/2002   PACEMAKER IMPLANT N/A 11/08/2018   Procedure: PACEMAKER IMPLANT;  Surgeon: CConstance Haw MD;  [facility];  Service: Cardiovascular;  Laterality: N/A;   SHOULDER SURGERY Right 2020   TOTAL KNEE ARTHROPLASTY Bilateral 09/2010   VENTRAL HERNIA REPAIR  03/2009   Patient Active Problem List   Diagnosis Date Noted   Exostosis of jaw 02/22/2022   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   Teeth missing 02/22/2022   Accretions on teeth 02/22/2022   Chronic periodontitis 02/22/2022   Gingival recession, localized 02/22/2022   Excessive dental attrition 02/22/2022   Abfraction 02/22/2022   Malocclusion 02/22/2022   Incipient enamel caries 02/22/2022   Encounter for preoperative dental examination 02/21/2022   Malignant neoplasm of base of tongue ([facility] 02/18/2022   Squamous cell carcinoma of oropharynx ([facility] 02/17/2022   Cholelithiasis with chronic cholecystitis 10/05/2019   Stable angina 03/23/2018   Abnormal stress test 03/23/2018   Aortic stenosis    Pulmonary HTN (HCC)    Exertional chest pain 03/07/2018   Coronary artery calcification seen on CAT scan 07/03/2013  Heart murmur 07/03/2013   Pericarditis 06/12/2013    ONSET DATE: See "pertinent history" below   REFERRING DIAG: SCC of his Oropharynx, stage 1   THERAPY DIAG:  Dysphagia, unspecified type  Rationale for Evaluation and Treatment Rehabilitation  SUBJECTIVE:   SUBJECTIVE STATEMENT: "Swallowing - my throat seems like it is getting tighter and tighter." Reports gravitating to softer foods and soups now.  Pt accompanied by: self and significant other  PERTINENT HISTORY:  He presented to Dr. Sabino Gasser [facility]) on 01/26/22 with complaints of a neck lump present for 1-2 months. During this  consultation he reported a tight feeling in his throat and difficulty swallowing solids, liquids, pills. 01/26/22 Laryngoscopy with Dr. Sabino Gasser revealed an exophytic friable tumor at least 2-3 cm in diameter involving the base on the tongue, extending across the midline. Exam also showed a left level 2 lymph node fixed to underlying soft tissues. Overall, findings were notably suspicious for aggressive oropharyngeal squamous cell carcinoma. 02/02/22 CT neck showed a 4 cm mainly exophytic mass centered at the left base of the tongue, with at least 1 ipsilateral malignant node measuring nearly 4 cm with signs of extracapsular extension. Findings were overall consistent with metastatic squamous cell carcinoma. 02/10/22 Biopsy of enlarged left neck node revealed: moderately differentiated squamous cell carcinoma; p16 positive. Diagnostic comment also noted the lymph node as presumably entirely replaced by carcinoma. 02/17/22 Consult with Dr. Chryl Heck and 02/18/22 Consult with Dr. Isidore Moos. 02/18/22 PET showed no evidence of metastatic disease. Treatment plan:  He will receive 35 fractions of radiation to his Oropharynx and bilateral neck with weekly chemotherapy. CT simulation scheduled for 03/01/22. He will start the week of 03/07/22. PAC/PEG scheduled for 03/08/22.   PAIN:  Are you having pain? Yes: NPRS scale: 2/10 Pain location: Throat Pain description: soreness, mostly when swallowing Aggravating factors: swallowing Relieving factors: nothing  FALLS: Has patient fallen in last 6 months?  No  LIVING ENVIRONMENT: Lives with: lives with their spouse Lives in: House/apartment  PLOF:  Level of assistance: Independent with ADLs Employment: Film/video editor; Science writer estate  PATIENT GOALS: Maintain swallow function  OBJECTIVE:   COGNITION: Overall cognitive status: Within functional limits for tasks assessed  LANGUAGE: Receptive and Expressive language appeared WNL  ORAL MOTOR EXAMINATION: Overall  status: WFL  CLINICAL SWALLOW ASSESSMENT:   Current diet: regular and thin liquids Dentition: natural dentition Patient directly observed with POs: Yes: regular and thin liquids  Feeding: able to feed self Liquids provided by: cup Oral phase signs and symptoms: None today Pharyngeal phase signs and symptoms: None today  TODAY'S TREATMENT: Research states the risk for dysphagia increases due to radiation and/or chemotherapy treatment due to a variety of factors, so SLP educated the pt about the possibility of reduced/limited ability for PO intake during rad tx. SLP also educated pt regarding possible changes to swallowing musculature after rad tx, and why adherence to dysphagia HEP provided today and PO consumption was necessary to inhibit muscle fibrosis following rad tx and to mitigate muscle disuse atrophy. SLP informed pt why this would be detrimental to their swallowing status and to their pulmonary health. Pt demonstrated understanding of these things to SLP. SLP encouraged pt to safely eat and drink as deep into their radiation/chemotherapy as possible to provide the best possible long-term swallowing outcome for pt.    SLP then developed an individualized HEP for pt involving oral and pharyngeal and vocal  strengthening and ROM and pt was instructed how to perform these exercises, including  SLP demonstration. After SLP demonstration, pt return demonstrated each exercise. SLP ensured pt performance was correct prior to educating pt on next exercise. Pt required min cues faded to modified independent to perform HEP. Pt was instructed to complete this program 6-7 days/week, at least 2 times a day until 6 months after his or her last day of rad tx, and then x2 a week after that, indefinitely. Modifications for days when pt cannot perform HEP as prescribed include cycle through the full program of exercises instead of fatiguing on one of the swallowing exercises and being unable to perform the  others, and take pain meds prior to performing HEP. SLP instructed that swallowing exercise reps should be increased back as pt is able to do so. Secondly, pt was told that former patients have told SLP that during their course of radiation therapy, taking prescribed pain medication just prior to performing HEP (and eating/drinking) has proven helpful in completing HEP (and eating and drinking) more regularly when going through their course of radiation treatment.   PATIENT EDUCATION: Education details: late effects head/neck radiation on swallow function, HEP procedure, and modification to HEP when difficulty experienced with swallowing during and after radiation course Person educated: Patient Education method: Consulting civil engineer, Demonstration, Verbal cues, and Handouts Education comprehension: verbalized understanding, returned demonstration, verbal cues required, and needs further education   ASSESSMENT:  CLINICAL IMPRESSION: Patient is a 76 y.o. male who was seen today for assessment of swallowing as they undergo chemoradiation therapy. Today pt ate items from regular diet and drank thin liquids. POs included [place] sandwich and drank thin liquids and were without overt s/s oral or pharyngeal difficulty. At this time pt swallowing is deemed WNL/WFL with these POs. No oral or overt s/sx pharyngeal deficits, including aspiration were observed. There are no overt s/s aspiration PNA observed by SLP nor any reported by pt at this time. Data indicate that pt's swallow ability will likely decrease over the course of chemoradiation therapy and could very well decline over time following the conclusion of that therapy due to muscle disuse atrophy and/or muscle fibrosis. Pt will cont to need to be seen by SLP in order to assess safety of PO intake, assess the need for recommending any objective swallow assessment, and ensuring pt is correctly completing the individualized HEP.  OBJECTIVE IMPAIRMENTS: include  dysphagia. These impairments are limiting patient from safety when swallowing. Factors affecting potential to achieve goals and functional outcome are none. Patient will benefit from skilled SLP services to address above impairments and improve overall function.  REHAB POTENTIAL: Good   GOALS: Goals reviewed with patient? No   SHORT TERM GOALS: Target date:  2 therapy visits (visit #3)       pt will complete HEP with rare min A  Baseline: Goal status: Initial   2.  pt will tell SLP why pt is completing HEP with modified independence Baseline:  Goal status: Initial   3.  pt will describe 3 overt s/s aspiration PNA with modified independence Baseline:  Goal status: Initial     LONG TERM GOALS: Target date:  6 therapy visits (visit #7)      pt will complete HEP with modified independnence in 2 sessions Baseline:  Goal status: Initial   2.  pt will describe how to modify HEP over time, and the timeline associated with reduction in HEP frequency with modified independence over two sessions Baseline:  Goal status: Initial     PLAN: SLP FREQUENCY:  once every approx  4 weeks   SLP DURATION:  7 total sessions   PLANNED INTERVENTIONS: Aspiration precaution training, Pharyngeal strengthening exercises, Diet toleration management , Trials of upgraded texture/liquids, Internal/external aids, SLP instruction and feedback, Compensatory strategies, and Patient/family education    [facility], [place] 02/24/2022, 1:25 PM

## 2022-02-24 NOTE — Patient Instructions (Signed)
SWALLOWING EXERCISES Do these until 6 months after your last day of radiation, then 2 times per week afterwards  Effortful Swallows - Press your tongue against the roof of your mouth for 3 seconds, then squeeze the muscles in your neck while you swallow your saliva or a sip of water - Repeat 10-15 times, 2-3 times a day, and use whenever you eat or drink  [place] Swallow - swallow with your tongue sticking out - Stick tongue out past your lips and gently bite tongue with your teeth - Swallow, while holding your tongue with your teeth - Repeat 10-15 times, 2-3 times a day *use a wet spoon if your mouth gets dry*  Shaker Exercise - head lift - Lie flat on your back in your bed or on a couch without pillows - Raise your head and look at your feet - KEEP YOUR SHOULDERS DOWN - HOLD FOR 45-60 SECONDS, then lower your head back down - Repeat 3 times, 2-3 times a day  [employer] - "half swallow" exercise - Start to swallow, and keep your Adam's apple up by squeezing hard with the muscles of the throat - Hold the squeeze for 5-7 seconds and then relax - Repeat 10-15 times, 2-3 times a day *use a wet spoon if your mouth gets dry*       5. "Super Swallow" (optional)  - Take a breath and hold it  - Swallow then IMMEDIATELY cough  - Repeat 10 times, 2-3 times a day

## 2022-02-24 NOTE — Telephone Encounter (Signed)
Scheduled appt per 10/19 sch msg. Pt is aware.

## 2022-02-24 NOTE — Addendum Note (Signed)
Addended by: Garald Balding B on: 02/24/2022 01:30 PM   Modules accepted: Orders

## 2022-02-24 NOTE — Research (Addendum)
[employer] 2021-05 - Specimen Collection Study to Evaluate Biomarkers in Subjects with Cancer    02/24/2022  ELIGIBILITY:  This Coordinator has reviewed this patient's inclusion and exclusion criteria and confirmed Clarence Andrade is eligible for study participation.  Patient will continue with enrollment.  Eligibility confirmed by treating investigator, who also agrees that patient should proceed with enrollment.   Note to file was confirmed and signed by Dr. Chryl Heck, treating investigator confirming patient has one diagnosis. Patient was upstaged after completing PET scan. All confirmed prior to patient enrollment.   CONSENT:  Patient Clarence Andrade was identified by Dr. Chryl Heck as a potential candidate for the above listed study.  This Clinical Research Coordinator met with Clarence Andrade, [id number], on 02/24/22 in a manner and location that ensures patient privacy to discuss participation in the above listed research study.  Patient is Accompanied by his wife .  A copy of the informed consent document with embedded HIPAA language was provided to the patient.  Patient reads, speaks, and understands [place].    Patient was provided with the business card of this Coordinator and encouraged to contact the research team with any questions.  Patient was provided the option of taking informed consent documents home to review and was encouraged to review at their convenience with their support network, including other care providers. Patient is comfortable with making a decision regarding study participation today.  As outlined in the informed consent form, this Coordinator and Clarence Andrade discussed the purpose of the research study, the investigational nature of the study, study procedures and requirements for study participation, potential risks and benefits of study participation, as well as alternatives to participation. This study is not blinded. The patient understands participation is  voluntary and they may withdraw from study participation at any time.  This study does not involve randomization.  This study does not involve an investigational drug or device. This study does not involve a placebo. Patient understands enrollment is pending full eligibility review.   Confidentiality and how the patient's information will be used as part of study participation were discussed.  Patient was informed there is reimbursement provided for their time and effort spent on trial participation.  The patient is encouraged to discuss research study participation with their insurance provider to determine what costs they may incur as part of study participation, including research related injury.    All questions were answered to patient's satisfaction.  The informed consent with embedded HIPAA language was reviewed page by page.  The patient's mental and emotional status is appropriate to provide informed consent, and the patient verbalizes an understanding of study participation.  Patient has agreed to participate in the above listed research study and has voluntarily signed the informed consent dated 22 May 2020  with embedded HIPAA language, version dated 22 May 2020  on 02/24/22 at 1215PM.  The patient was provided with a copy of the signed informed consent form with embedded HIPAA language for their reference.  No study specific procedures were obtained prior to the signing of the informed consent document.  Approximately 25 minutes were spent with the patient reviewing the informed consent documents.  Patient was not requested to complete a Release of Information form.   After consent was obtained, medical history was collected as follows:   Medical History:  High Blood Pressure  Yes Coronary Artery Disease Yes Lupus    No Rheumatoid Arthritis  No Diabetes   No  Lynch Syndrome  No  Is the patient currently taking a magnesium supplement?   No  Does the patient have a personal history  of cancer (greater than 5 years ago)?  No  Does the patient have a family history of cancer in 1st or 2nd degree relatives? Yes If yes, Relationship(s) and Cancer type(s)?  Sister: CRC, breast  Does the patient have history of alcohol consumption? No     Does the patient have history of cigarette, cigar, pipe, or chewing tobacco use?  Yes Former: Smoke for 28 yrs total at a 0.5 pack per day. Quit smoking in 1993 (30 yrs ago).   After medical history was obtained, patient and his wife were escorted to lab: Blood Collection: Research blood obtained by [facility] venipuncture. Patient tolerated well without any adverse events. Gift Card: $50 gift card given to patient for her participation in this study.    Late entry for 02/24/2022:  This Nurse has reviewed this patient's inclusion and exclusion criteria as a second review and confirms Clarence Andrade is eligible for study participation.  Patient may continue with enrollment.  Jeral Fruit, RN 02/25/22 2:27 PM

## 2022-03-01 ENCOUNTER — Ambulatory Visit (INDEPENDENT_AMBULATORY_CARE_PROVIDER_SITE_OTHER): Payer: PPO | Admitting: Dentistry

## 2022-03-01 ENCOUNTER — Ambulatory Visit
Admission: RE | Admit: 2022-03-01 | Discharge: 2022-03-01 | Disposition: A | Discharge status: Home / Self Care | Payer: PPO | Source: Ambulatory Visit | Attending: Radiation Oncology | Admitting: Radiation Oncology

## 2022-03-01 ENCOUNTER — Other Ambulatory Visit: Payer: Self-pay

## 2022-03-01 VITALS — BP 110/74 | HR 71 | Temp 98.5°F

## 2022-03-01 VITALS — BP 114/69 | HR 71 | Temp 97.8°F | Resp 20 | Ht 72.0 in | Wt 274.0 lb

## 2022-03-01 DIAGNOSIS — C01 Malignant neoplasm of base of tongue: Secondary | ICD-10-CM | POA: Diagnosis not present

## 2022-03-01 DIAGNOSIS — C109 Malignant neoplasm of oropharynx, unspecified: Secondary | ICD-10-CM | POA: Diagnosis not present

## 2022-03-01 DIAGNOSIS — Z463 Encounter for fitting and adjustment of dental prosthetic device: Secondary | ICD-10-CM

## 2022-03-01 MED ORDER — SODIUM CHLORIDE 0.9% FLUSH
10.0000 mL | INTRAVENOUS | Status: DC | PRN
  Administered 2022-03-01: 10 mL via INTRAVENOUS

## 2022-03-01 NOTE — Progress Notes (Signed)
Oncology Nurse Navigator Documentation   To provide support, encouragement and care continuity, met with Mr. Haskett during his CT SIM. He was accompanied by his wife. He tolerated procedure without difficulty, denied questions/concerns.    I also provided PEG/port education prior to 03/08/22 placement.  Using  PEG teaching device   and Teach Back, provided education for PEG use and care, including: hand hygiene, gravity bolus administration of daily water flushes and nutritional supplement, fluids and medications; care of tube insertion site including daily dressing change and cleaning; S&S of infection.   Mr. Lundahl correctly verbalized procedures for and provided correct return demonstration of gravity administration of water, dressing change and site care.  I provided written instructions for PEG flushing/dressing change in support of verbal instruction.   I provided/described contents of Start of Care Bolus Feeding Kit (3 60 cc syringes, 1 box 4x4 drainage sponges, 1 package mesh briefs, 1 roll paper tape, 1 case Osmolite 1.5).  He voiced understanding he is to start using Osmolite per guidance of Nutrition. He understands I will be available for ongoing PEG support. Provided barium sulfate prep which I obtained from [facility] IR and reviewed instructions.  Harlow Asa RN, BSN, OCN Head & Neck Oncology Nurse [place] [facility] Phone # [phone]  Fax # [phone]

## 2022-03-01 NOTE — Progress Notes (Deleted)
iv

## 2022-03-01 NOTE — Progress Notes (Signed)
[facility]     TODAY'S VISIT:   DELIVERY: INTRAORAL APPLIANCE     PROCEDURES: Delivered upper+lower scatter protection devices     PLAN: Follow-up after the completion of radiation therapy. Call if any questions or concerns arise before next visit.    Service Date:   03/01/2022  Patient Name:   Clarence Andrade Date of Birth:   13-Dec-1945 Medical Record Number: [id number]   HISTORY OF PRESENT ILLNESS: Clarence Andrade presents today for delivery of upper and lower scatter protection devices. Medical and dental history were reviewed with the patient.  No changes were reported. The patient has scheduled radiation simulation after this appointment.   CHIEF COMPLAINT:   Here for a routine dental appointment; patient with no complaints.   Patient Active Problem List   Diagnosis Date Noted   Exostosis of jaw 02/22/2022   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   Teeth missing 02/22/2022   Accretions on teeth 02/22/2022   Chronic periodontitis 02/22/2022   Gingival recession, localized 02/22/2022   Excessive dental attrition 02/22/2022   Abfraction 02/22/2022   Malocclusion 02/22/2022   Incipient enamel caries 02/22/2022   Encounter for preoperative dental examination 02/21/2022   Malignant neoplasm of base of tongue ([place]) 02/18/2022   Squamous cell carcinoma of oropharynx ([place]) 02/17/2022   Cholelithiasis with chronic cholecystitis 10/05/2019   Stable angina 03/23/2018   Abnormal stress test 03/23/2018   Aortic stenosis    Pulmonary HTN (HCC)    Exertional chest pain 03/07/2018   Coronary artery calcification seen on CAT scan 07/03/2013   Heart murmur 07/03/2013   Pericarditis 06/12/2013   Past Medical History:  Diagnosis Date   Abdominal hernia    Midline   Adrenal incidentaloma ([place]) 2010   Aortic stenosis    Ascending aortic aneurysm ([place])    a. 4.4cm by imaging 03/2020.   CAD (coronary artery disease)    a. Cath  03/2018 - CTO of the prox-mid LAD with bridging, L-L and R-L collaterals, moderate, non-obstructive disease involving the proximal LAD, proximal LCx, and distal RCA, more severe disease noted involving small branch vessels (RV marginal and superior branch of OM3).   Complete heart block (HCC)    Gallstones    Heart murmur    History of pacemaker    a. 11/2018 - Medtronic, intermittent CHB.   Hyperlipidemia    Morbid obesity (HCC)    Myocardial infarction [facility])    NSVT (nonsustained ventricular tachycardia) (HCC)    OSA (obstructive sleep apnea)    Severe PSG 1/07 AHI 66/hr, O2 Nadir 50% Refuses treatment - Pt does not believe test was accurate   Pericarditis    Pneumonia    Pulmonary HTN (HCC)    mild with PASP 55mHg by echo 02/2018, not seen on 03/2020 echo   Pulmonary nodule    Screening for AAA (abdominal aortic aneurysm) 2010   CT Abd/Pelvis   Ventral hernia    03/2009   Current Outpatient Medications  Medication Sig Dispense Refill   acetaminophen (TYLENOL) 500 MG tablet Take 1,000 mg by mouth every 6 (six) hours as needed (pain).     aspirin EC 81 MG tablet Take 81 mg by mouth daily.     buPROPion (WELLBUTRIN XL) 150 MG 24 hr tablet Take 150 mg by mouth every morning.     COVID-19 mRNA bivalent vaccine, Pfizer, (PFIZER COVID-19 VAC BIVALENT) injection Inject into the muscle. 0.3 mL 0   COVID-19 mRNA Vac-TriS, Pfizer, (PFIZER-BIONT  COVID-19 VAC-TRIS) SUSP injection Inject into the muscle. 0.3 mL 0   COVID-19 mRNA vaccine 2023-2024 (COMIRNATY) syringe Inject into the muscle. 0.3 mL 0   dexamethasone (DECADRON) 4 MG tablet Take 2 tablets daily x 3 days starting the day after cisplatin chemotherapy. Take with food. 30 tablet 1   influenza vaccine adjuvanted (FLUAD) 0.5 ML injection Inject into the muscle. 0.5 mL 0   isosorbide mononitrate (IMDUR) 30 MG 24 hr tablet TAKE 1 TABLET ONCE DAILY. 90 tablet 0   lidocaine-prilocaine (EMLA) cream Apply to affected area once 30 g 3    metoprolol succinate (TOPROL-XL) 25 MG 24 hr tablet Take 0.5 tablets (12.5 mg total) by mouth daily. 90 tablet 3   Multiple Vitamin (MULTIVITAMIN) tablet Take 1 tablet by mouth daily.     nitroGLYCERIN (NITROSTAT) 0.4 MG SL tablet Place 1 tablet (0.4 mg total) under the tongue every 5 (five) minutes as needed for chest pain. 25 tablet 3   ondansetron (ZOFRAN) 8 MG tablet Take 1 tablet (8 mg total) by mouth every 8 (eight) hours as needed for nausea or vomiting. Start on the third day after cisplatin. 30 tablet 1   prochlorperazine (COMPAZINE) 10 MG tablet Take 1 tablet (10 mg total) by mouth every 6 (six) hours as needed (Nausea or vomiting). 30 tablet 1   rosuvastatin (CRESTOR) 10 MG tablet TAKE 1 TABLET ONCE DAILY. 90 tablet 0   RSV vaccine recomb adjuvanted (AREXVY) 120 MCG/0.5ML injection Inject into the muscle. 0.5 mL 0   Sulfamethoxazole-Trimethoprim (SULFAMETHOXAZOLE-TMP DS PO) Take by mouth.     No current facility-administered medications for this visit.   Allergies  Allergen Reactions   Sertraline Hcl Other (See Comments)    Tremor, HA, Decreased Libido     VITALS:  BP 110/74 (BP Location: Right Arm, Patient Position: Sitting, Cuff Size: Normal)   Pulse 71   Temp 98.5 F (36.9 C) (Oral)    ASSESSMENT/INDICATION(S):  Patient with anticipated radiation therapy to the head and neck region.   PROCEDURES: Delivery of upper and lower scatter protection devices. Appliances were tried in and adjusted as needed.  Polished. Postoperative instructions were provided in a written and verbal format concerning the use and care of appliances.   PLAN/RECOMMENDATIONS: Follow-up after radiation therapy. Call if any questions or concerns arise before next visit.  All questions and concerns were invited and addressed.  The patient tolerated today's visit well and departed in stable condition.  -Sandi Mariscal, DMD

## 2022-03-01 NOTE — Progress Notes (Signed)
Has armband been applied?  Yes.    Does patient have an allergy to IV contrast dye?: No.   Has patient ever received premedication for IV contrast dye?: No.   Date of lab work: 02/24/2022 $RemoveBeforeDE'@1247'[id number]$  BUN: 16 CR: 0.97 eGFR: >60  Does patient take metformin?: No.  Is eGFR >60?: Yes.   If no, when can patient resume? (Must be 48 hrs AFTER they receive IV contrast):    IV site: rt hand  Has IV site been added to flowsheet?  Yes.    There were no vitals taken for this visit. Vitals:   03/01/22 0912  BP: 114/69  Pulse: 71  Resp: 20  Temp: 97.8 F (36.6 C)  SpO2: 95%  Weight: 124.3 kg  Height: 6' (1.829 m)

## 2022-03-02 ENCOUNTER — Inpatient Hospital Stay: Payer: PPO

## 2022-03-02 ENCOUNTER — Inpatient Hospital Stay: Payer: PPO | Admitting: Dietician

## 2022-03-02 DIAGNOSIS — C01 Malignant neoplasm of base of tongue: Secondary | ICD-10-CM

## 2022-03-02 DIAGNOSIS — C109 Malignant neoplasm of oropharynx, unspecified: Secondary | ICD-10-CM

## 2022-03-02 DIAGNOSIS — R3916 Straining to void: Secondary | ICD-10-CM | POA: Diagnosis not present

## 2022-03-02 DIAGNOSIS — N400 Enlarged prostate without lower urinary tract symptoms: Secondary | ICD-10-CM | POA: Diagnosis not present

## 2022-03-02 DIAGNOSIS — Z23 Encounter for immunization: Secondary | ICD-10-CM | POA: Diagnosis not present

## 2022-03-02 NOTE — Progress Notes (Signed)
Nutrition Assessment   Reason for Assessment: HNC   ASSESSMENT: 76 year old male with newly diagnosed stage II SCC of oropharynx. Planning for concurrent chemoradiation with weekly cisplatin (first treatment 11/1). Pending G-tube placement on 10/31. Patient is followed by Dr. Iruku and Dr. Squire.   Past medical history includes pericarditis, aortic stenosis, HTN, stable angina, CAD, AAA, MI s/p pacemaker(2020), HLD, OSA, cholecystectomy (2021).  Met with patient and wife following chemo education. Both report feeling overwhelmed at visit. Patient reports diminished desire for food in the last few weeks. Nothing appeals to him per wife. Patient says he had intentionally been trying to eat healthier and lose weight in last 3-4 months. He reports 10 of the ~25 lbs lost has occurred in the last 3 weeks secondary to dysphagia and dysgeusia. Per recall pt had egg/sausage scramble with piece of toast for breakfast, BBQ sandwich for lunch, pimento cheese on wheat thins for snack, bowl of cereal for dinner followed by cobbler and ice cream for dessert. Wife reports patient has been eating more cereal for his supper meal. He is using whole fat milk. Patient reports drinking one atkins protein shake (150 kcal, 30g), ~48 ounces of water along with some tea, lemonade, and occasional diet soda. Patient denies nausea, vomiting. Patient reports diarrhea/constipation are typical at baseline. He takes a fiber supplement/laxative as needed.   Nutrition Focused Physical Exam: deferred    Medications: wellbutrin, decadron, imdur, toprol, MVI, zofran, compazine, crestor  Labs: 10/13 labs reviewed    Anthropometrics:   Height: 6 Weight: 274 lb  UBW: 300 lb  BMI: 37.16    NUTRITION DIAGNOSIS: Predicted suboptimal intake related to newly diagnosed HNC as evidenced by reported swallowing difficulty x3 weeks pending start of concurrent chemoradiation    INTERVENTION:  Educated on importance of adequate intake  of calorie and protein energy intake to maintain strength/weights during treatment Encouraged eating smaller meals/snacks more frequently to promote increased overall intake - handout with snack ideas provided Educated on soft moist high protein foods, smooth textures for ease of intake - handout provided  Patient will work to increase intake of water, recommend 8-10 glasses daily Discussed ways to add calories/protein to foods (using milk in creamy soups/oatmeal, adding cheese/gravy/butter, high calorie shakes) - shake recipes provided  Continue drinking protein shake daily, suggested switching to Ensure Plus/equivalent for added calories - samples of Ensure, Boost, CIB provided for pt to try Plans for Gtube placement 10/31 - will defer tube feeding education  Contact information provided   MONITORING, EVALUATION, GOAL: Patient will tolerate increased calories and protein to minimize weight loss during treatment   Next Visit: f/u to be scheduled weekly with treatment        

## 2022-03-02 NOTE — Progress Notes (Signed)
Pharmacist Chemotherapy Monitoring - Initial Assessment    Anticipated start date: 03/09/22   The following has been reviewed per standard work regarding the patient's treatment regimen: The patient's diagnosis, treatment plan and drug doses, and organ/hematologic function Lab orders and baseline tests specific to treatment regimen  The treatment plan start date, drug sequencing, and pre-medications Prior authorization status  Patient's documented medication list, including drug-drug interaction screen and prescriptions for anti-emetics and supportive care specific to the treatment regimen The drug concentrations, fluid compatibility, administration routes, and timing of the medications to be used The patient's access for treatment and lifetime cumulative dose history, if applicable  The patient's medication allergies and previous infusion related reactions, if applicable   Changes made to treatment plan:  N/A  Follow up needed:  N/A   Clarence Andrade, [place], 03/02/2022  1:58 PM

## 2022-03-07 ENCOUNTER — Encounter: Payer: Self-pay | Admitting: Hematology and Oncology

## 2022-03-07 ENCOUNTER — Other Ambulatory Visit: Payer: Self-pay | Admitting: Student

## 2022-03-07 DIAGNOSIS — C01 Malignant neoplasm of base of tongue: Secondary | ICD-10-CM

## 2022-03-07 NOTE — H&P (Signed)
Chief Complaint: Patient was seen in consultation today for port-a-catheter and gastrostomy tube placements  Referring Physician(s): Iruku,Praveena  Supervising Physician: {Supervising Physician:[id number]}  Patient Status: Clarence Andrade - Out-pt  History of Present Illness: Clarence Andrade is a 76 y.o. male with a medical history significant for MI, CAD, ascending aortic aneurysm, non-sustained ventricular tachycardia and complete heart block s/p pacemaker.   He was evaluated by ENT mid-September for a lump in his left neck, difficulty swallowing and dysgeusia. He also had a weight loss of approximately 20 lb. On September 20 he underwent flexible fiberoptic endoscopy that showed a large base of tongue tumor. Additional work up revealed metastatic squamous cell carcinoma. His oncology team is preparing him for chemotherapy and radiation therapy.   Interventional Radiology has been asked to evaluate this patient for an image-guided port-a-catheter and gastrostomy tube placements to facilitate his treatment plans.   Past Medical History:  Diagnosis Date   Abdominal hernia    Midline   Adrenal incidentaloma ([place]) 2010   Aortic stenosis    Ascending aortic aneurysm ([place])    a. 4.4cm by imaging 03/2020.   CAD (coronary artery disease)    a. Cath 03/2018 - CTO of the prox-mid LAD with bridging, L-L and R-L collaterals, moderate, non-obstructive disease involving the proximal LAD, proximal LCx, and distal RCA, more severe disease noted involving small branch vessels (RV marginal and superior branch of OM3).   Complete heart block (HCC)    Gallstones    Heart murmur    History of pacemaker    a. 11/2018 - Medtronic, intermittent CHB.   Hyperlipidemia    Morbid obesity (HCC)    Myocardial infarction [facility])    NSVT (nonsustained ventricular tachycardia) (HCC)    OSA (obstructive sleep apnea)    Severe PSG 1/07 AHI 66/hr, O2 Nadir 50% Refuses treatment - Pt does not believe test was accurate    Pericarditis    Pneumonia    Pulmonary HTN (HCC)    mild with PASP 52mHg by echo 02/2018, not seen on 03/2020 echo   Pulmonary nodule    Screening for AAA (abdominal aortic aneurysm) 2010   CT Abd/Pelvis   Ventral hernia    03/2009    Past Surgical History:  Procedure Laterality Date   CHOLECYSTECTOMY N/A 10/11/2019   Procedure: LAPAROSCOPIC CHOLECYSTECTOMY WITH INTRAOPERATIVE CHOLANGIOGRAM;  Surgeon: GArmandina Gemma MD;  Location: [facility];  Service: General;  Laterality: N/A;   CORONARY ANGIOGRAPHY N/A 03/23/2018   Procedure: CORONARY ANGIOGRAPHY;  Surgeon: ENelva Bush MD;  [facility];  Service: Cardiovascular;  Laterality: N/A;   HERNIA REPAIR  05/2002   three   KNEE ARTHROSCOPY Left 09/2002   PACEMAKER IMPLANT N/A 11/08/2018   Procedure: PACEMAKER IMPLANT;  Surgeon: CConstance Haw MD;  [facility];  Service: Cardiovascular;  Laterality: N/A;   SHOULDER SURGERY Right 2020   TOTAL KNEE ARTHROPLASTY Bilateral 09/2010   VENTRAL HERNIA REPAIR  03/2009    Allergies: Sertraline hcl  Medications: Prior to Admission medications   Medication Sig Start Date End Date Taking? Authorizing Provider  acetaminophen (TYLENOL) 500 MG tablet Take 1,000 mg by mouth every 6 (six) hours as needed (pain).    [provider]  aspirin EC 81 MG tablet Take 81 mg by mouth daily.    [provider]  buPROPion (WELLBUTRIN XL) 150 MG 24 hr tablet Take 150 mg by mouth every morning. 01/18/21   [provider]  COVID-19 mRNA bivalent  vaccine, Pfizer, (PFIZER COVID-19 VAC BIVALENT) injection Inject into the muscle. 04/09/21   Carlyle Basques, MD  COVID-19 mRNA Vac-TriS, Pfizer, (PFIZER-BIONT COVID-19 VAC-TRIS) SUSP injection Inject into the muscle. 10/27/20   Carlyle Basques, MD  COVID-19 mRNA vaccine [phone] (COMIRNATY) syringe Inject into the muscle. 02/22/22   Carlyle Basques, MD  dexamethasone (DECADRON) 4 MG tablet Take 2 tablets  daily x 3 days starting the day after cisplatin chemotherapy. Take with food. 02/23/22   Benay Pike, MD  influenza vaccine adjuvanted (FLUAD) 0.5 ML injection Inject into the muscle. 01/28/22   Carlyle Basques, MD  isosorbide mononitrate (IMDUR) 30 MG 24 hr tablet TAKE 1 TABLET ONCE DAILY. 03/01/21   Dunn, Nedra Hai, PA-C  lidocaine-prilocaine (EMLA) cream Apply to affected area once 02/23/22   Benay Pike, MD  metoprolol succinate (TOPROL-XL) 25 MG 24 hr tablet Take 0.5 tablets (12.5 mg total) by mouth daily. 02/17/22   Sueanne Margarita, MD  Multiple Vitamin (MULTIVITAMIN) tablet Take 1 tablet by mouth daily.    [provider]  nitroGLYCERIN (NITROSTAT) 0.4 MG SL tablet Place 1 tablet (0.4 mg total) under the tongue every 5 (five) minutes as needed for chest pain. 03/19/18   Nahser, Wonda Cheng, MD  ondansetron (ZOFRAN) 8 MG tablet Take 1 tablet (8 mg total) by mouth every 8 (eight) hours as needed for nausea or vomiting. Start on the third day after cisplatin. 02/23/22   Benay Pike, MD  prochlorperazine (COMPAZINE) 10 MG tablet Take 1 tablet (10 mg total) by mouth every 6 (six) hours as needed (Nausea or vomiting). 02/23/22   Benay Pike, MD  rosuvastatin (CRESTOR) 10 MG tablet TAKE 1 TABLET ONCE DAILY. 02/10/21   Dunn, Nedra Hai, PA-C  RSV vaccine recomb adjuvanted (AREXVY) 120 MCG/0.5ML injection Inject into the muscle. 01/21/22   Carlyle Basques, MD  Sulfamethoxazole-Trimethoprim (SULFAMETHOXAZOLE-TMP DS PO) Take by mouth.    [provider]     Family History  Problem Relation Age of Onset   Heart attack Father    Heart disease Father    Aneurysm Father    Breast cancer Sister    Colon cancer Sister     Social History   Socioeconomic History   Marital status: Married    Spouse name: Not on file   Number of children: Not on file   Years of education: Not on file   Highest education level: Not on file  Occupational History   Not on file  Tobacco Use    Smoking status: Former    Packs/day: 0.50    Types: Cigarettes    Quit date: 51    Years since quitting: 30.8   Smokeless tobacco: Never  Vaping Use   Vaping Use: Never used  Substance and Sexual Activity   Alcohol use: No   Drug use: No   Sexual activity: Not Currently  Other Topics Concern   Not on file  Social History Narrative   Not on file   Social Determinants of Health   Financial Resource Strain: Low Risk  (02/24/2022)   Overall Financial Resource Strain (CARDIA)    Difficulty of Paying Living Expenses: Not hard at all  Food Insecurity: No Food Insecurity (02/24/2022)   Hunger Vital Sign    Worried About Running Out of Food in the Last Year: Never true    [place] in the Last Year: Never true  Transportation Needs: No Transportation Needs (02/24/2022)   PRAPARE - Transportation    Lack of Transportation (  Medical): No    Lack of Transportation (Non-Medical): No  Physical Activity: Not on file  Stress: Not on file  Social Connections: Not on file    Review of Systems: A 12 point ROS discussed and pertinent positives are indicated in the HPI above.  All other systems are negative.  Review of Systems  Vital Signs: There were no vitals taken for this visit.  Physical Exam  Imaging: CUP PACEART INCLINIC DEVICE CHECK  Result Date: 02/22/2022 Pacemaker check in clinic. Normal device function. Thresholds, sensing, impedances consistent with previous measurements. Device programmed to maximize longevity. Afib burden <0.1%.  Few brief high ventricular rates noted-see attachment. Device programmed at appropriate safety margins. Histogram distribution appropriate for patient activity level. Device programmed to optimize intrinsic conduction. Estimated longevity 11.3 years. Patient enrolled in remote follow-up. Patient education completed.Myrtie Hawk, BSN, RN  NM PET Image Initial (PI) Skull Base To Thigh (F-18 FDG)  Result Date: 02/20/2022 CLINICAL DATA:   Initial treatment strategy for oropharyngeal neoplasm. EXAM: NUCLEAR MEDICINE PET SKULL BASE TO THIGH TECHNIQUE: 13.79 mCi F-18 FDG was injected intravenously. Full-ring PET imaging was performed from the skull base to thigh after the radiotracer. CT data was obtained and used for attenuation correction and anatomic localization. Fasting blood glucose: 111 mg/dl COMPARISON:  Neck CT 02/02/2022 FINDINGS: Mediastinal blood pool activity: SUV max 2.87 Liver activity: SUV max NA NECK: Extensive hypermetabolic tumor involving the left tonsillar region, base of the tongue, the left vallecular and probable left-sided the uvula. The SUV max is 21.80. There is also a large hypermetabolic nodal mass in the left level 2 region. SUV max is 21.16. No right-sided neck adenopathy. No lower left neck adenopathy. Incidental CT findings: None. CHEST: No hypermetabolic mediastinal or hilar nodes. No suspicious pulmonary nodules on the CT scan. Incidental CT findings: The pacer wires are in good position without complicating features. Aortic and coronary artery calcifications are noted. No acute pulmonary findings. No pleural effusion. ABDOMEN/PELVIS: No abnormal hypermetabolic activity within the liver, pancreas, adrenal glands, or spleen. No hypermetabolic lymph nodes in the abdomen or pelvis. Low Incidental CT findings: Moderate atherosclerotic calcifications involving the aorta and iliac arteries. No aneurysm. Status post cholecystectomy. No biliary dilatation. Sigmoid colon diverticulosis. Enlarged prostate gland with median lobe hypertrophy impressing on the base of the bladder. SKELETON: No findings for osseous metastatic disease. There is diffuse marrow uptake which could be related to chemotherapy. Incidental CT findings: None. IMPRESSION: 1. Large left-sided markedly hypermetabolic oropharyngeal neoplasm and associated adjacent left-sided metastatic lymphadenopathy. 2. No findings for metastatic disease involving the chest,  abdomen/pelvis or bony structures. Electronically Signed   By: Marijo Sanes M.D.   On: 02/20/2022 18:44   [place] CORE BIOPSY (LYMPH NODES)  Result Date: 02/10/2022 INDICATION: 76 year old gentleman with history of or pharyngeal malignancy presents to IR for biopsy of enlarged left neck lymph node. EXAM: Ultrasound-guided biopsy of enlarged left neck lymph node. MEDICATIONS: None. ANESTHESIA/SEDATION: None COMPLICATIONS: None immediate. PROCEDURE: Informed written consent was obtained from the patient after a thorough discussion of the procedural risks, benefits and alternatives. All questions were addressed. Maximal Sterile Barrier Technique was utilized including caps, mask, sterile gowns, sterile gloves, sterile drape, hand hygiene and skin antiseptic. A timeout was performed prior to the initiation of the procedure. Patient position supine on the ultrasound table. Left neck skin prepped and draped in usual sterile fashion. Following local lidocaine administration, four 18 gauge cores were obtained from the enlarged left neck lymph node utilizing continuous ultrasound guidance.  Samples were sent to pathology in sterile saline. Needle removed and hemostasis achieved with 2 minutes of manual compression. Post procedure ultrasound images showed no evidence of significant hemorrhage. IMPRESSION: Ultrasound-guided biopsy of left neck lymph node. Electronically Signed   By: Miachel Roux M.D.   On: 02/10/2022 14:07    Labs:  CBC: Recent Labs    03/30/21 1119  WBC 7.0  HGB 16.0  HCT 46.4  PLT 204    COAGS: No results for input(s): "INR", "APTT" in the last 8760 hours.  BMP: Recent Labs    03/30/21 1119 10/13/21 0954 02/24/22 1247  NA 142 140  --   K 5.2 4.7  --   CL 103 103  --   CO2 29 34*  --   GLUCOSE 106* 96  --   BUN '16 20 16  '$ CALCIUM 9.2 9.3  --   CREATININE 0.91 0.96 0.97  GFRNONAA  --   --  >60    LIVER FUNCTION TESTS: Recent Labs    03/30/21 1119  BILITOT 0.4  AST 20  ALT  18  ALKPHOS 73  PROT 6.8  ALBUMIN 4.1    TUMOR MARKERS: No results for input(s): "AFPTM", "CEA", "CA199", "CHROMGRNA" in the last 8760 hours.  Assessment and Plan:  Squamous cell carcinoma of the oropharynx; pending radiation and chemotherapy: Clarence Andrade, 76 year old male, presents today to the [facility] department for image-guided port-a-catheter and gastrostomy tube placements.   Risks and benefits of image-guided Port-a-catheter placement were discussed with the patient including, but not limited to bleeding, infection, pneumothorax, or fibrin sheath development and need for additional procedures.  Risks and benefits image guided gastrostomy tube placement was discussed with the patient including, but not limited to the need for a barium enema during the procedure, bleeding, infection, peritonitis and/or damage to adjacent structures.  All of the patient's questions were answered, patient is agreeable to proceed.  Consent signed and in chart.  Thank you for this interesting consult.  I greatly enjoyed meeting Clarence Andrade and look forward to participating in their care.  A copy of this report was sent to the requesting provider on this date.  Electronically Signed: Soyla Dryer, AGACNP-BC [phone] 03/07/2022, 12:42 PM   I spent a total of  30 Minutes   in face to face in clinical consultation, greater than 50% of which was counseling/coordinating care for port-a-catheter and gastrostomy tube placements.

## 2022-03-07 NOTE — Progress Notes (Signed)
Called pt to introduce myself as his Financial Resource Specialist and to discuss the [employer] grant.  I left a msg requesting he return my call if he's interested in applying for the grant.  

## 2022-03-08 ENCOUNTER — Inpatient Hospital Stay: Payer: PPO

## 2022-03-08 ENCOUNTER — Ambulatory Visit (HOSPITAL_COMMUNITY)
Admission: RE | Admit: 2022-03-08 | Discharge: 2022-03-08 | Disposition: A | Discharge status: Home / Self Care | Payer: PPO | Source: Ambulatory Visit | Attending: Hematology and Oncology | Admitting: Hematology and Oncology

## 2022-03-08 ENCOUNTER — Other Ambulatory Visit: Payer: Self-pay

## 2022-03-08 ENCOUNTER — Inpatient Hospital Stay: Payer: PPO | Admitting: Hematology and Oncology

## 2022-03-08 DIAGNOSIS — I7121 Aneurysm of the ascending aorta, without rupture: Secondary | ICD-10-CM | POA: Insufficient documentation

## 2022-03-08 DIAGNOSIS — C01 Malignant neoplasm of base of tongue: Secondary | ICD-10-CM

## 2022-03-08 DIAGNOSIS — I442 Atrioventricular block, complete: Secondary | ICD-10-CM | POA: Insufficient documentation

## 2022-03-08 DIAGNOSIS — Z87891 Personal history of nicotine dependence: Secondary | ICD-10-CM | POA: Insufficient documentation

## 2022-03-08 DIAGNOSIS — I2510 Atherosclerotic heart disease of native coronary artery without angina pectoris: Secondary | ICD-10-CM | POA: Insufficient documentation

## 2022-03-08 DIAGNOSIS — Z950 Presence of cardiac pacemaker: Secondary | ICD-10-CM | POA: Insufficient documentation

## 2022-03-08 DIAGNOSIS — C760 Malignant neoplasm of head, face and neck: Secondary | ICD-10-CM | POA: Diagnosis not present

## 2022-03-08 DIAGNOSIS — C109 Malignant neoplasm of oropharynx, unspecified: Secondary | ICD-10-CM | POA: Diagnosis not present

## 2022-03-08 DIAGNOSIS — Z452 Encounter for adjustment and management of vascular access device: Secondary | ICD-10-CM | POA: Diagnosis not present

## 2022-03-08 DIAGNOSIS — I252 Old myocardial infarction: Secondary | ICD-10-CM | POA: Insufficient documentation

## 2022-03-08 HISTORY — PX: IR IMAGING GUIDED PORT INSERTION: IMG5740

## 2022-03-08 HISTORY — PX: IR GASTROSTOMY TUBE MOD SED: IMG625

## 2022-03-08 LAB — CBC WITH DIFFERENTIAL (CANCER CENTER ONLY)
Abs Immature Granulocytes: 0.02 10*3/uL (ref 0.00–0.07)
Basophils Absolute: 0.1 10*3/uL (ref 0.0–0.1)
Basophils Relative: 1 %
Eosinophils Absolute: 0.3 10*3/uL (ref 0.0–0.5)
Eosinophils Relative: 3 %
HCT: 43 % (ref 39.0–52.0)
Hemoglobin: 14.2 g/dL (ref 13.0–17.0)
Immature Granulocytes: 0 %
Lymphocytes Relative: 13 %
Lymphs Abs: 1.1 10*3/uL (ref 0.7–4.0)
MCH: 31.4 pg (ref 26.0–34.0)
MCHC: 33 g/dL (ref 30.0–36.0)
MCV: 95.1 fL (ref 80.0–100.0)
Monocytes Absolute: 0.8 10*3/uL (ref 0.1–1.0)
Monocytes Relative: 9 %
Neutro Abs: 6.5 10*3/uL (ref 1.7–7.7)
Neutrophils Relative %: 74 %
Platelet Count: 262 10*3/uL (ref 150–400)
RBC: 4.52 MIL/uL (ref 4.22–5.81)
RDW: 12.8 % (ref 11.5–15.5)
WBC Count: 8.8 10*3/uL (ref 4.0–10.5)
nRBC: 0 % (ref 0.0–0.2)

## 2022-03-08 LAB — PROTIME-INR
INR: 1.1 (ref 0.8–1.2)
Prothrombin Time: 14.1 seconds (ref 11.4–15.2)

## 2022-03-08 LAB — BASIC METABOLIC PANEL - CANCER CENTER ONLY
Anion gap: 4 — ABNORMAL LOW (ref 5–15)
BUN: 17 mg/dL (ref 8–23)
CO2: 32 mmol/L (ref 22–32)
Calcium: 9.1 mg/dL (ref 8.9–10.3)
Chloride: 104 mmol/L (ref 98–111)
Creatinine: 0.88 mg/dL (ref 0.61–1.24)
GFR, Estimated: >60 mL/min (ref >60)
Glucose, Bld: 110 mg/dL — ABNORMAL HIGH (ref 70–99)
Potassium: 4.5 mmol/L (ref 3.5–5.1)
Sodium: 140 mmol/L (ref 135–145)

## 2022-03-08 LAB — MAGNESIUM: Magnesium: 2.1 mg/dL (ref 1.7–2.4)

## 2022-03-08 MED ORDER — HEPARIN SOD (PORK) LOCK FLUSH 100 UNIT/ML IV SOLN
INTRAVENOUS | Status: AC
  Filled 2022-03-08: qty 5

## 2022-03-08 MED ORDER — MIDAZOLAM HCL 2 MG/2ML IJ SOLN
INTRAMUSCULAR | Status: AC | PRN
  Administered 2022-03-08 (×2): 1 mg via INTRAVENOUS

## 2022-03-08 MED ORDER — LIDOCAINE-EPINEPHRINE 1 %-1:100000 IJ SOLN
INTRAMUSCULAR | Status: AC
  Filled 2022-03-08: qty 1

## 2022-03-08 MED ORDER — LIDOCAINE HCL 1 % IJ SOLN
INTRAMUSCULAR | Status: AC
  Filled 2022-03-08: qty 20

## 2022-03-08 MED ORDER — GLUCAGON HCL RDNA (DIAGNOSTIC) 1 MG IJ SOLR
INTRAMUSCULAR | Status: AC
  Filled 2022-03-08: qty 1

## 2022-03-08 MED ORDER — MIDAZOLAM HCL 2 MG/2ML IJ SOLN
INTRAMUSCULAR | Status: AC | PRN
  Administered 2022-03-08: .5 mg via INTRAVENOUS

## 2022-03-08 MED ORDER — ONDANSETRON HCL 4 MG/2ML IJ SOLN: 4.0000 mg | INTRAMUSCULAR | Status: DC | PRN

## 2022-03-08 MED ORDER — LIDOCAINE VISCOUS HCL 2 % MT SOLN
OROMUCOSAL | Status: AC
  Filled 2022-03-08: qty 15

## 2022-03-08 MED ORDER — IOHEXOL 300 MG/ML  SOLN
50.0000 mL | Freq: Once | INTRAMUSCULAR | Status: AC | PRN
  Administered 2022-03-08: 10 mL

## 2022-03-08 MED ORDER — FENTANYL CITRATE (PF) 100 MCG/2ML IJ SOLN
INTRAMUSCULAR | Status: AC | PRN
  Administered 2022-03-08 (×3): 50 ug via INTRAVENOUS

## 2022-03-08 MED ORDER — MIDAZOLAM HCL 2 MG/2ML IJ SOLN
INTRAMUSCULAR | Status: AC
  Filled 2022-03-08: qty 2

## 2022-03-08 MED ORDER — SODIUM CHLORIDE 0.9 % IV SOLN: INTRAVENOUS | Status: DC

## 2022-03-08 MED ORDER — GLUCAGON HCL (RDNA) 1 MG IJ SOLR
INTRAMUSCULAR | Status: AC | PRN
  Administered 2022-03-08: .5 mg via INTRAVENOUS

## 2022-03-08 MED ORDER — CEFAZOLIN IN SODIUM CHLORIDE 3-0.9 GM/100ML-% IV SOLN
3.0000 g | INTRAVENOUS | Status: AC
  Administered 2022-03-08: 3 g via INTRAVENOUS
  Filled 2022-03-08: qty 100

## 2022-03-08 MED ORDER — FENTANYL CITRATE (PF) 100 MCG/2ML IJ SOLN
INTRAMUSCULAR | Status: AC
  Filled 2022-03-08: qty 2

## 2022-03-08 MED ORDER — HYDROCODONE-ACETAMINOPHEN 5-325 MG PO TABS: 1.0000 | ORAL_TABLET | ORAL | Status: DC | PRN

## 2022-03-08 MED ORDER — TEMAZEPAM 15 MG PO CAPS: 15.0000 mg | ORAL_CAPSULE | Freq: Every evening | ORAL | 0 refills | Status: DC | PRN

## 2022-03-08 MED ORDER — LIDOCAINE-EPINEPHRINE 1 %-1:100000 IJ SOLN
INTRAMUSCULAR | Status: AC | PRN
  Administered 2022-03-08: 10 mL via INTRADERMAL

## 2022-03-08 MED ORDER — HYDROMORPHONE HCL 1 MG/ML IJ SOLN: 1.0000 mg | INTRAMUSCULAR | Status: DC | PRN

## 2022-03-08 MED FILL — Dexamethasone Sodium Phosphate Inj 100 MG/10ML: INTRAMUSCULAR | Qty: 1 | Status: AC

## 2022-03-08 MED FILL — Fosaprepitant Dimeglumine For IV Infusion 150 MG (Base Eq): INTRAVENOUS | Qty: 5 | Status: AC

## 2022-03-08 NOTE — Procedures (Signed)
Vascular and Interventional Radiology Procedure Note  Patient: Clarence Andrade DOB: 06-08-1945 Medical Record Number: [id number] Note Date/Time: 03/08/22 1:13 PM   Performing Physician: Michaelle Birks, MD Assistant(s): None  Diagnosis: Head and Neck cancer  Procedure:  PORT PLACEMENT PERCUTANEOUS GASTROSTOMY TUBE PLACEMENT  Anesthesia: Conscious Sedation Complications: None Estimated Blood Loss: Minimal  Findings:  Successful right-sided port placement, with the tip of the catheter in the proximal right atrium. Successful placement of a 49F gastrostomy tube under fluoroscopy.  Plan: Port a Catheter ready for use. Extended recovery for outpatient gastrostomy placement.  See detailed procedure note with images in PACS. The patient tolerated the procedure well without incident or complication and was returned to Recovery in stable condition.    Michaelle Birks, MD [facility]ascular [facility] Interventional Radiology [facility] Radiology   Pager. [place]

## 2022-03-08 NOTE — Assessment & Plan Note (Signed)
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  PET/CT without any evidence of distant metastatic disease.  He has been doing very well except for increasing lymphadenopathy in the left neck, some radiating pain to the left ear.  His most bothersome complaint today is however insomnia.  He is hoping that we can prescribe him something for insomnia.  He also has an ongoing UTI for which she has been on antibiotic for the past 5 to 6 days denies any urinary symptoms.   No fevers, chills, hematuria, flank pain or other symptoms concerning for urinary tract infection. At this time since he has no systemic signs of UTI and his current UTI symptoms have almost resolved, rapidly growing oropharyngeal cancer, we have discussed about the risks of having an overt infection, sepsis with chemo however decided that it is in his best interest to proceed with chemoradiation with close monitoring.  He is in complete agreement with this.  He understands that rarely this can be fatal but he is willing to take his chances.  He will proceed with labs and UA today.  He may even consider going on prophylactic antibiotic if he continues to have UTI symptoms.

## 2022-03-08 NOTE — Sedation Documentation (Signed)
Port a cath placement complete. Patient prepped for Gastrostomy tube placement.

## 2022-03-08 NOTE — Progress Notes (Signed)
[place] NOTE  Patient Care Team: Kathalene Frames, MD as PCP - General (Internal Medicine) Sueanne Margarita, MD as PCP - Cardiology (Cardiology) Constance Haw, MD as PCP - Electrophysiology (Cardiology) Malmfelt, Stephani Police, RN as Oncology Nurse Navigator Benay Pike, MD as Consulting Physician (Hematology and Oncology) [name], MD as Consulting Physician (Radiation Oncology)  CHIEF COMPLAINTS/PURPOSE OF CONSULTATION:  Squamous cell carcinoma of the oropharynx  ASSESSMENT & PLAN:  No problem-specific Assessment & Plan notes found for this encounter.  Orders Placed This Encounter  Procedures   CBC with Differential ([place] Only)    Standing Status:   Future    Standing Expiration Date:   26/07/7856   Basic Metabolic Panel - [place] Only    Standing Status:   Future    Standing Expiration Date:   03/17/2023   Magnesium    Standing Status:   Future    Standing Expiration Date:   03/17/2023   CBC with Differential (Cancer Center Only)    Standing Status:   Future    Standing Expiration Date:   85/06/7739   Basic Metabolic Panel - Cancer Center Only    Standing Status:   Future    Standing Expiration Date:   03/24/2023   Magnesium    Standing Status:   Future    Standing Expiration Date:   03/24/2023   Urinalysis, Complete w Microscopic    Standing Status:   Future    Number of Occurrences:   1    Standing Expiration Date:   03/09/2023   Malignant neoplasm of base of tongue ([place]) This is a very pleasant 76 year old male patient with 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  PET/CT without any evidence of distant metastatic disease.  He has been doing very well except for increasing lymphadenopathy in the left neck, some radiating pain to the left ear.  His most bothersome complaint today is however  insomnia.  He is hoping that we can prescribe him something for insomnia.  He also has an ongoing UTI for which she has been on antibiotic for the past 5 to 6 days denies any urinary symptoms.   No fevers, chills, hematuria, flank pain or other symptoms concerning for urinary tract infection. At this time since he has no systemic signs of UTI and his current UTI symptoms have almost resolved, rapidly growing oropharyngeal cancer, we have discussed about the risks of having an overt infection, sepsis with chemo however decided that it is in his best interest to proceed with chemoradiation with close monitoring.  He is in complete agreement with this.  He understands that rarely this can be fatal but he is willing to take his chances.  He will proceed with labs and UA today.  He may even consider going on prophylactic antibiotic if he continues to have UTI symptoms.  # Insomnia. He says he has had insomnia for the past 6 weeks. He had tried [place].  We will try Restoril.  He understands Restoril can cause some daytime drowsiness and increase the risk of falls.  He was advised to not drive if he feels drowsy.  He expressed understanding  # Left ear pain from the tumor, mild taking tylenol.   HISTORY OF PRESENTING ILLNESS:  Clarence Andrade 76 y.o. male is here because of SCC oropharynx.  Oncologic History   This is a  very pleasant 76 year old male patient who was seen by ENT with chief complaint of lump in his neck ongoing for the past 1 to 2 months.  Around July he suddenly noticed the lump in the neck.  He was initially treated by antibiotics but this has not changed.  He also has started noticing some difficulty swallowing and some dysgeusia.  He tells me that he has lost about 20 pounds of weight in the past 2 to 3 months but part of it could be intentional.  He was seen by Dr. Synetta Shadow on September 20 for an initial consultation and he had flexible fiberoptic endoscopy the same day which noticed  large base of tongue tumor.  This tumor measured at least 2 to 3 cm in diameter extending across midline.  He also had a CT on September 27 which confirmed 4 cm mainly exophytic mass centered at the left base of the tongue with at least 1 ipsilateral malignant node measuring nearly 4 cm with signs of extracapsular extension.  Findings consistent with metastatic squamous cell carcinoma.  PET/CT done on October 13 with no findings of metastatic disease, large left-sided hypermetabolic oropharyngeal neoplasm and adjacent left-sided metastatic lymphadenopathy.  Since last visit, he has an ongoing urinary tract infection and has been on levofloxacin for almost 5 days now.  He has no current urinary symptoms.  No other fevers or chills, flank pain or hematuria.  He however complains of severe insomnia.  He however has always been a light sleeper, slept for about 5 hours to 6 hours but now has not been able to sleep even for a couple hours.  He feels exhausted and has been wondering if we can prescribe him some heavy-duty medication.  In the past when he tried Ambien it had no effect.  Other than the insomnia, he also notices some pain in the left ear, mild, takes Tylenol.  He notices that the mass has been growing rapidly and is hoping to start soon.  Rest of the pertinent 10 point ROS reviewed and negative  REVIEW OF SYSTEMS:   Constitutional: Denies fevers, chills or abnormal night sweats Eyes: Denies blurriness of vision, double vision or watery eyes Ears, nose, mouth, throat, and face: Denies mucositis or sore throat Respiratory: Denies cough, dyspnea or wheezes Cardiovascular: Denies palpitation, chest discomfort or lower extremity swelling Gastrointestinal:  Denies nausea, heartburn or change in bowel habits Skin: Denies abnormal skin rashes Lymphatics: Denies new lymphadenopathy or easy bruising Neurological:Denies numbness, tingling or new weaknesses Behavioral/Psych: Mood is stable, no new  changes  All other systems were reviewed with the patient and are negative.  MEDICAL HISTORY:  Past Medical History:  Diagnosis Date   Abdominal hernia    Midline   Adrenal incidentaloma ([place]) 2010   Aortic stenosis    Ascending aortic aneurysm ([place])    a. 4.4cm by imaging 03/2020.   CAD (coronary artery disease)    a. Cath 03/2018 - CTO of the prox-mid LAD with bridging, L-L and R-L collaterals, moderate, non-obstructive disease involving the proximal LAD, proximal LCx, and distal RCA, more severe disease noted involving small branch vessels (RV marginal and superior branch of OM3).   Complete heart block (HCC)    Gallstones    Heart murmur    History of pacemaker    a. 11/2018 - [employer]tronic, intermittent CHB.   Hyperlipidemia    Morbid obesity (HCC)    Myocardial infarction (HCC)    NSVT (nonsustained ventricular tachycardia) ([place])  OSA (obstructive sleep apnea)    Severe PSG 1/07 AHI 66/hr, O2 [place] 50% Refuses treatment - Pt does not believe test was accurate   Pericarditis    Pneumonia    Pulmonary HTN (HCC)    mild with PASP 77mHg by echo 02/2018, not seen on 03/2020 echo   Pulmonary nodule    Screening for AAA (abdominal aortic aneurysm) 2010   CT Abd/Pelvis   Ventral hernia    03/2009    SURGICAL HISTORY: Past Surgical History:  Procedure Laterality Date   CHOLECYSTECTOMY N/A 10/11/2019   Procedure: LAPAROSCOPIC CHOLECYSTECTOMY WITH INTRAOPERATIVE CHOLANGIOGRAM;  Surgeon: GArmandina Gemma MD;  Location: [facility];  Service: General;  Laterality: N/A;   CORONARY ANGIOGRAPHY N/A 03/23/2018   Procedure: CORONARY ANGIOGRAPHY;  Surgeon: ENelva Bush MD;  [facility];  Service: Cardiovascular;  Laterality: N/A;   HERNIA REPAIR  05/2002   three   KNEE ARTHROSCOPY Left 09/2002   PACEMAKER IMPLANT N/A 11/08/2018   Procedure: PACEMAKER IMPLANT;  Surgeon: CConstance Haw MD;  [facility];  Service: Cardiovascular;  Laterality: N/A;    SHOULDER SURGERY Right 2020   TOTAL KNEE ARTHROPLASTY Bilateral 09/2010   VENTRAL HERNIA REPAIR  03/2009    SOCIAL HISTORY: Social History   Socioeconomic History   Marital status: Married    Spouse name: Not on file   Number of children: Not on file   Years of education: Not on file   Highest education level: Not on file  Occupational History   Not on file  Tobacco Use   Smoking status: Former    Packs/day: 0.50    Types: Cigarettes    Quit date: 146   Years since quitting: 30.8   Smokeless tobacco: Never  Vaping Use   Vaping Use: Never used  Substance and Sexual Activity   Alcohol use: No   Drug use: No   Sexual activity: Not Currently  Other Topics Concern   Not on file  Social History Narrative   Not on file   Social Determinants of Health   Financial Resource Strain: Low Risk  (02/24/2022)   Overall Financial Resource Strain (CARDIA)    Difficulty of Paying Living Expenses: Not hard at all  Food Insecurity: No Food Insecurity (02/24/2022)   Hunger Vital Sign    Worried About Running Out of Food in the Last Year: Never true    RAmazoniain the Last Year: Never true  Transportation Needs: No Transportation Needs (02/24/2022)   PRAPARE - THydrologist(Medical): No    Lack of Transportation (Non-Medical): No  Physical Activity: Not on file  Stress: Not on file  Social Connections: Not on file  Intimate Partner Violence: Not on file    FAMILY HISTORY: Family History  Problem Relation Age of Onset   Heart attack Father    Heart disease Father    Aneurysm Father    Breast cancer Sister    Colon cancer Sister     ALLERGIES:  is allergic to sertraline hcl.  MEDICATIONS:  Current Outpatient Medications  Medication Sig Dispense Refill   levofloxacin (LEVAQUIN) 500 MG tablet Take 500 mg by mouth daily.     tamsulosin (FLOMAX) 0.4 MG CAPS capsule Take 0.4 mg by mouth daily.     acetaminophen (TYLENOL) 500 MG tablet Take  1,000 mg by mouth every 6 (six) hours as needed (pain).     aspirin EC 81 MG  tablet Take 81 mg by mouth daily.     buPROPion (WELLBUTRIN XL) 150 MG 24 hr tablet Take 150 mg by mouth every morning.     COVID-19 mRNA bivalent vaccine, Pfizer, (PFIZER COVID-19 VAC BIVALENT) injection Inject into the muscle. 0.3 mL 0   COVID-19 mRNA Vac-TriS, Pfizer, (PFIZER-BIONT COVID-19 VAC-TRIS) SUSP injection Inject into the muscle. 0.3 mL 0   COVID-19 mRNA vaccine 2023-2024 (COMIRNATY) syringe Inject into the muscle. 0.3 mL 0   dexamethasone (DECADRON) 4 MG tablet Take 2 tablets daily x 3 days starting the day after cisplatin chemotherapy. Take with food. 30 tablet 1   influenza vaccine adjuvanted (FLUAD) 0.5 ML injection Inject into the muscle. 0.5 mL 0   isosorbide mononitrate (IMDUR) 30 MG 24 hr tablet TAKE 1 TABLET ONCE DAILY. 90 tablet 0   lidocaine-prilocaine (EMLA) cream Apply to affected area once 30 g 3   metoprolol succinate (TOPROL-XL) 25 MG 24 hr tablet Take 0.5 tablets (12.5 mg total) by mouth daily. 90 tablet 3   Multiple Vitamin (MULTIVITAMIN) tablet Take 1 tablet by mouth daily.     nitroGLYCERIN (NITROSTAT) 0.4 MG SL tablet Place 1 tablet (0.4 mg total) under the tongue every 5 (five) minutes as needed for chest pain. 25 tablet 3   ondansetron (ZOFRAN) 8 MG tablet Take 1 tablet (8 mg total) by mouth every 8 (eight) hours as needed for nausea or vomiting. Start on the third day after cisplatin. 30 tablet 1   prochlorperazine (COMPAZINE) 10 MG tablet Take 1 tablet (10 mg total) by mouth every 6 (six) hours as needed (Nausea or vomiting). 30 tablet 1   rosuvastatin (CRESTOR) 10 MG tablet TAKE 1 TABLET ONCE DAILY. 90 tablet 0   RSV vaccine recomb adjuvanted (AREXVY) 120 MCG/0.5ML injection Inject into the muscle. 0.5 mL 0   Sulfamethoxazole-Trimethoprim (SULFAMETHOXAZOLE-TMP DS PO) Take by mouth.     No current facility-administered medications for this visit.     PHYSICAL EXAMINATION: ECOG  PERFORMANCE STATUS: 0 - Asymptomatic  Vitals:   03/08/22 0831  BP: 107/66  Pulse: 70  Resp: 16  Temp: 97.9 F (36.6 C)  SpO2: 96%   Filed Weights   03/08/22 0831  Weight: 280 lb (127 kg)    Physical Exam Constitutional:      Appearance: Normal appearance.  Cardiovascular:     Rate and Rhythm: Normal rate and regular rhythm.     Pulses: Normal pulses.     Heart sounds: Normal heart sounds.  Musculoskeletal:        General: Normal range of motion.     Cervical back: Normal range of motion and neck supple. No rigidity.  Lymphadenopathy:     Cervical: Cervical adenopathy (Increasing lymphadenopathy on the left side) present.  Skin:    General: Skin is warm and dry.  Neurological:     Mental Status: He is alert.    LABORATORY DATA:  I have reviewed the data as listed Lab Results  Component Value Date   WBC 8.8 03/08/2022   HGB 14.2 03/08/2022   HCT 43.0 03/08/2022   MCV 95.1 03/08/2022   PLT 262 03/08/2022     Chemistry      Component Value Date/Time   NA 140 10/13/2021 0954   K 4.7 10/13/2021 0954   CL 103 10/13/2021 0954   CO2 34 (H) 10/13/2021 0954   BUN 16 02/24/2022 1247   BUN 20 10/13/2021 0954   CREATININE 0.97 02/24/2022 1247      Component  Value Date/Time   CALCIUM 9.3 10/13/2021 0954   ALKPHOS 73 03/30/2021 1119   AST 20 03/30/2021 1119   ALT 18 03/30/2021 1119   BILITOT 0.4 03/30/2021 1119       RADIOGRAPHIC STUDIES: I have personally reviewed the radiological images as listed and agreed with the findings in the report. CUP PACEART INCLINIC DEVICE CHECK  Result Date: 02/22/2022 Pacemaker check in clinic. Normal device function. Thresholds, sensing, impedances consistent with previous measurements. Device programmed to maximize longevity. Afib burden <0.1%.  Few brief high ventricular rates noted-see attachment. Device programmed at appropriate safety margins. Histogram distribution appropriate for patient activity level. Device programmed to  optimize intrinsic conduction. Estimated longevity 11.3 years. Patient enrolled in remote follow-up. Patient education completed.Myrtie Hawk, BSN, RN  NM PET Image Initial (PI) Skull Base To Thigh (F-18 FDG)  Result Date: 02/20/2022 CLINICAL DATA:  Initial treatment strategy for oropharyngeal neoplasm. EXAM: NUCLEAR MEDICINE PET SKULL BASE TO THIGH TECHNIQUE: 13.79 mCi F-18 FDG was injected intravenously. Full-ring PET imaging was performed from the skull base to thigh after the radiotracer. CT data was obtained and used for attenuation correction and anatomic localization. Fasting blood glucose: 111 mg/dl COMPARISON:  Neck CT 02/02/2022 FINDINGS: Mediastinal blood pool activity: SUV max 2.87 Liver activity: SUV max NA NECK: Extensive hypermetabolic tumor involving the left tonsillar region, base of the tongue, the left vallecular and probable left-sided the uvula. The SUV max is 21.80. There is also a large hypermetabolic nodal mass in the left level 2 region. SUV max is 21.16. No right-sided neck adenopathy. No lower left neck adenopathy. Incidental CT findings: None. CHEST: No hypermetabolic mediastinal or hilar nodes. No suspicious pulmonary nodules on the CT scan. Incidental CT findings: The pacer wires are in good position without complicating features. Aortic and coronary artery calcifications are noted. No acute pulmonary findings. No pleural effusion. ABDOMEN/PELVIS: No abnormal hypermetabolic activity within the liver, pancreas, adrenal glands, or spleen. No hypermetabolic lymph nodes in the abdomen or pelvis. Low Incidental CT findings: Moderate atherosclerotic calcifications involving the aorta and iliac arteries. No aneurysm. Status post cholecystectomy. No biliary dilatation. Sigmoid colon diverticulosis. Enlarged prostate gland with median lobe hypertrophy impressing on the base of the bladder. SKELETON: No findings for osseous metastatic disease. There is diffuse marrow uptake which could be  related to chemotherapy. Incidental CT findings: None. IMPRESSION: 1. Large left-sided markedly hypermetabolic oropharyngeal neoplasm and associated adjacent left-sided metastatic lymphadenopathy. 2. No findings for metastatic disease involving the chest, abdomen/pelvis or bony structures. Electronically Signed   By: Marijo Sanes M.D.   On: 02/20/2022 18:44   [place] CORE BIOPSY (LYMPH NODES)  Result Date: 02/10/2022 INDICATION: 76 year old gentleman with history of or pharyngeal malignancy presents to IR for biopsy of enlarged left neck lymph node. EXAM: Ultrasound-guided biopsy of enlarged left neck lymph node. MEDICATIONS: None. ANESTHESIA/SEDATION: None COMPLICATIONS: None immediate. PROCEDURE: Informed written consent was obtained from the patient after a thorough discussion of the procedural risks, benefits and alternatives. All questions were addressed. Maximal Sterile Barrier Technique was utilized including caps, mask, sterile gowns, sterile gloves, sterile drape, hand hygiene and skin antiseptic. A timeout was performed prior to the initiation of the procedure. Patient position supine on the ultrasound table. Left neck skin prepped and draped in usual sterile fashion. Following local lidocaine administration, four 18 gauge cores were obtained from the enlarged left neck lymph node utilizing continuous ultrasound guidance. Samples were sent to pathology in sterile saline. Needle removed and hemostasis achieved with 2 minutes  of manual compression. Post procedure ultrasound images showed no evidence of significant hemorrhage. IMPRESSION: Ultrasound-guided biopsy of left neck lymph node. Electronically Signed   By: Miachel Roux M.D.   On: 02/10/2022 14:07    All questions were answered. The patient knows to call the clinic with any problems, questions or concerns. I spent 30 minutes in the care of this patient including H and P, review of records, counseling and coordination of care.     Benay Pike,  MD 03/08/2022 9:15 AM

## 2022-03-09 ENCOUNTER — Inpatient Hospital Stay: Payer: PPO

## 2022-03-09 ENCOUNTER — Ambulatory Visit
Admission: RE | Admit: 2022-03-09 | Discharge: 2022-03-09 | Disposition: A | Discharge status: Home / Self Care | Payer: PPO | Source: Ambulatory Visit | Attending: Radiation Oncology | Admitting: Radiation Oncology

## 2022-03-09 ENCOUNTER — Other Ambulatory Visit: Payer: Self-pay

## 2022-03-09 ENCOUNTER — Inpatient Hospital Stay: Payer: PPO | Admitting: Dietician

## 2022-03-09 VITALS — BP 130/70 | HR 74 | Temp 97.8°F | Resp 18 | Wt 284.2 lb

## 2022-03-09 DIAGNOSIS — C01 Malignant neoplasm of base of tongue: Secondary | ICD-10-CM | POA: Insufficient documentation

## 2022-03-09 DIAGNOSIS — C109 Malignant neoplasm of oropharynx, unspecified: Secondary | ICD-10-CM | POA: Diagnosis not present

## 2022-03-09 DIAGNOSIS — Z5111 Encounter for antineoplastic chemotherapy: Secondary | ICD-10-CM | POA: Insufficient documentation

## 2022-03-09 DIAGNOSIS — Z79899 Other long term (current) drug therapy: Secondary | ICD-10-CM | POA: Insufficient documentation

## 2022-03-09 DIAGNOSIS — Z87891 Personal history of nicotine dependence: Secondary | ICD-10-CM | POA: Insufficient documentation

## 2022-03-09 DIAGNOSIS — Z510 Encounter for antineoplastic radiation therapy: Secondary | ICD-10-CM | POA: Diagnosis not present

## 2022-03-09 LAB — URINALYSIS, COMPLETE (UACMP) WITH MICROSCOPIC
Bilirubin Urine: NEGATIVE
Glucose, UA: NEGATIVE mg/dL
Hgb urine dipstick: NEGATIVE
Ketones, ur: 5 mg/dL — AB
Leukocytes,Ua: NEGATIVE
Nitrite: NEGATIVE
Protein, ur: NEGATIVE mg/dL
Specific Gravity, Urine: 1.024 (ref 1.005–1.030)
pH: 5 (ref 5.0–8.0)

## 2022-03-09 LAB — RAD ONC ARIA SESSION SUMMARY
Course Elapsed Days: 0
Plan Fractions Treated to Date: 1
Plan Prescribed Dose Per Fraction: 2 Gy
Plan Total Fractions Prescribed: 35
Plan Total Prescribed Dose: 70 Gy
Reference Point Dosage Given to Date: 2 Gy
Reference Point Session Dosage Given: 2 Gy
Session Number: 1

## 2022-03-09 MED ORDER — HEPARIN SOD (PORK) LOCK FLUSH 100 UNIT/ML IV SOLN
500.0000 [IU] | Freq: Once | INTRAVENOUS | Status: AC | PRN
  Administered 2022-03-09: 500 [IU]

## 2022-03-09 MED ORDER — SODIUM CHLORIDE 0.9 % IV SOLN: Freq: Once | INTRAVENOUS | Status: AC

## 2022-03-09 MED ORDER — MAGNESIUM SULFATE 2 GM/50ML IV SOLN
2.0000 g | Freq: Once | INTRAVENOUS | Status: AC
  Administered 2022-03-09: 2 g via INTRAVENOUS
  Filled 2022-03-09: qty 50

## 2022-03-09 MED ORDER — POTASSIUM CHLORIDE IN NACL 20-0.9 MEQ/L-% IV SOLN
Freq: Once | INTRAVENOUS | Status: AC
  Filled 2022-03-09: qty 1000

## 2022-03-09 MED ORDER — SODIUM CHLORIDE 0.9 % IV SOLN
10.0000 mg | Freq: Once | INTRAVENOUS | Status: AC
  Administered 2022-03-09: 10 mg via INTRAVENOUS
  Filled 2022-03-09: qty 10

## 2022-03-09 MED ORDER — SODIUM CHLORIDE 0.9% FLUSH
10.0000 mL | INTRAVENOUS | Status: DC | PRN
  Administered 2022-03-09: 10 mL

## 2022-03-09 MED ORDER — SODIUM CHLORIDE 0.9 % IV SOLN
150.0000 mg | Freq: Once | INTRAVENOUS | Status: AC
  Administered 2022-03-09: 150 mg via INTRAVENOUS
  Filled 2022-03-09: qty 150

## 2022-03-09 MED ORDER — SODIUM CHLORIDE 0.9 % IV SOLN
39.5000 mg/m2 | Freq: Once | INTRAVENOUS | Status: AC
  Administered 2022-03-09: 100 mg via INTRAVENOUS
  Filled 2022-03-09: qty 100

## 2022-03-09 MED ORDER — PALONOSETRON HCL INJECTION 0.25 MG/5ML
0.2500 mg | Freq: Once | INTRAVENOUS | Status: AC
  Administered 2022-03-09: 0.25 mg via INTRAVENOUS
  Filled 2022-03-09: qty 5

## 2022-03-09 NOTE — Patient Instructions (Addendum)
[facility] MEDICAL ONCOLOGY  Discharge Instructions: Thank you for choosing [facility] to provide your oncology and hematology care.   If you have a lab appointment with the Cancer Center, please go directly to the Cancer Center and check in at the registration area.   Wear comfortable clothing and clothing appropriate for easy access to any Portacath or PICC line.   We strive to give you quality time with your provider. You may need to reschedule your appointment if you arrive late (15 or more minutes).  Arriving late affects you and other patients whose appointments are after yours.  Also, if you miss three or more appointments without notifying the office, you may be dismissed from the clinic at the provider's discretion.      For prescription refill requests, have your pharmacy contact our office and allow 72 hours for refills to be completed.    Today you received the following chemotherapy and/or immunotherapy agents: Cisplatin.       To help prevent nausea and vomiting after your treatment, we encourage you to take your nausea medication as directed.  BELOW ARE SYMPTOMS THAT SHOULD BE REPORTED IMMEDIATELY: *FEVER GREATER THAN 100.4 F (38 C) OR HIGHER *CHILLS OR SWEATING *NAUSEA AND VOMITING THAT IS NOT CONTROLLED WITH YOUR NAUSEA MEDICATION *UNUSUAL SHORTNESS OF BREATH *UNUSUAL BRUISING OR BLEEDING *URINARY PROBLEMS (pain or burning when urinating, or frequent urination) *BOWEL PROBLEMS (unusual diarrhea, constipation, pain near the anus) TENDERNESS IN MOUTH AND THROAT WITH OR WITHOUT PRESENCE OF ULCERS (sore throat, sores in mouth, or a toothache) UNUSUAL RASH, SWELLING OR PAIN  UNUSUAL VAGINAL DISCHARGE OR ITCHING   Items with * indicate a potential emergency and should be followed up as soon as possible or go to the Emergency Department if any problems should occur.  Please show the CHEMOTHERAPY ALERT CARD or IMMUNOTHERAPY ALERT CARD at check-in  to the Emergency Department and triage nurse.  Should you have questions after your visit or need to cancel or reschedule your appointment, please contact [facility] MEDICAL ONCOLOGY  Dept: [phone]  and follow the prompts.  Office hours are 8:00 a.m. to 4:30 p.m. Monday - Friday. Please note that voicemails left after 4:00 p.m. may not be returned until the following business day.  We are closed weekends and major holidays. You have access to a nurse at all times for urgent questions. Please call the main number to the clinic Dept: [phone] and follow the prompts.   For any non-urgent questions, you may also contact your provider using MyChart. We now offer e-Visits for anyone 18 and older to request care online for non-urgent symptoms. For details visit [email or web address].   Also download the MyChart app! Go to the app store, search "MyChart", open the app, select [facility], and log in with your MyChart username and password.  Masks are optional in the cancer centers. If you would like for your care team to wear a mask while they are taking care of you, please let them know. You may have one support Alyene Predmore who is at least 76 years old accompany you for your appointments. 

## 2022-03-09 NOTE — Progress Notes (Signed)
Oncology Nurse Navigator Documentation   I met Clarence Andrade and his wife in infusion to reinforce PEG education after placement yesterday. I demonstrated flushing the tube and the dressing change with return demonstration. They know to call me if he has any questions or concerns during his cancer treatment.   Harlow Asa RN, BSN, OCN Head & Neck Oncology Nurse [place] [facility] Phone # [phone]  Fax # [phone]

## 2022-03-09 NOTE — Progress Notes (Signed)
Nutrition Follow-up:  Patient with newly diagnosed stage II SCC of oropharynx. He is currently concurrent chemoradiation with weekly cisplatin (first treatment 11/1). S/p G-tube placement 10/31.  Met with patient and wife during infusion. He is in good spirits today although he reports abdominal soreness s/p PEG. Patient states he slept very well last night, best he has had in some time. Patient has a good appetite and eating well. He is tolerating regular textures. He denies swallowing difficulty. Patient has increased intake of water as recommended. He recalls 40 ounces last night and another 40 ounces this morning. Patient denies nutrition impact symptoms.    Medications: reviewed   Labs: glucose 110  Anthropometrics: Wt 184 lb 4 oz today    NUTRITION DIAGNOSIS: Predicted suboptimal intake continues    INTERVENTION:  Continue small frequent meals/snacks with adequate calories and protein - pt has handout with ideas Pt will start baking soda salt water rinses several times daily - recipe provided Continue drinking protein shake daily Nurse navigator arrived to change dressing + complete water flush s/p 10/31 PEG Will defer tube feeding education      MONITORING, EVALUATION, GOAL: weight trends, oral intake, tube feeding    NEXT VISIT: Wednesday November 8 during infusion    

## 2022-03-10 ENCOUNTER — Other Ambulatory Visit: Payer: Self-pay

## 2022-03-10 ENCOUNTER — Telehealth: Payer: Self-pay

## 2022-03-10 ENCOUNTER — Ambulatory Visit
Admission: RE | Admit: 2022-03-10 | Discharge: 2022-03-10 | Disposition: A | Discharge status: Home / Self Care | Payer: PPO | Source: Ambulatory Visit | Attending: Radiation Oncology | Admitting: Radiation Oncology

## 2022-03-10 DIAGNOSIS — C109 Malignant neoplasm of oropharynx, unspecified: Secondary | ICD-10-CM | POA: Diagnosis not present

## 2022-03-10 DIAGNOSIS — C01 Malignant neoplasm of base of tongue: Secondary | ICD-10-CM | POA: Diagnosis not present

## 2022-03-10 DIAGNOSIS — Z5111 Encounter for antineoplastic chemotherapy: Secondary | ICD-10-CM | POA: Diagnosis not present

## 2022-03-10 DIAGNOSIS — Z510 Encounter for antineoplastic radiation therapy: Secondary | ICD-10-CM | POA: Diagnosis not present

## 2022-03-10 LAB — RAD ONC ARIA SESSION SUMMARY
Course Elapsed Days: 1
Plan Fractions Treated to Date: 2
Plan Prescribed Dose Per Fraction: 2 Gy
Plan Total Fractions Prescribed: 35
Plan Total Prescribed Dose: 70 Gy
Reference Point Dosage Given to Date: 4 Gy
Reference Point Session Dosage Given: 2 Gy
Session Number: 2

## 2022-03-10 NOTE — Telephone Encounter (Signed)
-----   Message from Daphane Shepherd, RN sent at 03/09/2022  2:52 PM EDT ----- Regarding: First time chemo Patient of Dr. Chryl Heck, First time cisplatin. Tolerated treatment very well. VSS, no signs of distress noted at discharge.

## 2022-03-10 NOTE — Telephone Encounter (Signed)
Clarence Andrade states that he is doing fine. He is eating, drinking, and urinating well.  He knows to call the office at [phone] if he has any questions or concerns.

## 2022-03-11 ENCOUNTER — Other Ambulatory Visit: Payer: Self-pay

## 2022-03-11 ENCOUNTER — Ambulatory Visit
Admission: RE | Admit: 2022-03-11 | Discharge: 2022-03-11 | Disposition: A | Discharge status: Home / Self Care | Payer: PPO | Source: Ambulatory Visit | Attending: Radiation Oncology | Admitting: Radiation Oncology

## 2022-03-11 DIAGNOSIS — C109 Malignant neoplasm of oropharynx, unspecified: Secondary | ICD-10-CM | POA: Diagnosis not present

## 2022-03-11 DIAGNOSIS — Z5111 Encounter for antineoplastic chemotherapy: Secondary | ICD-10-CM | POA: Diagnosis not present

## 2022-03-11 DIAGNOSIS — C01 Malignant neoplasm of base of tongue: Secondary | ICD-10-CM | POA: Diagnosis not present

## 2022-03-11 DIAGNOSIS — Z510 Encounter for antineoplastic radiation therapy: Secondary | ICD-10-CM | POA: Diagnosis not present

## 2022-03-11 LAB — RAD ONC ARIA SESSION SUMMARY
Course Elapsed Days: 2
Plan Fractions Treated to Date: 3
Plan Prescribed Dose Per Fraction: 2 Gy
Plan Total Fractions Prescribed: 35
Plan Total Prescribed Dose: 70 Gy
Reference Point Dosage Given to Date: 6 Gy
Reference Point Session Dosage Given: 2 Gy
Session Number: 3

## 2022-03-14 ENCOUNTER — Other Ambulatory Visit: Payer: Self-pay

## 2022-03-14 ENCOUNTER — Ambulatory Visit: Payer: PPO

## 2022-03-14 ENCOUNTER — Ambulatory Visit
Admission: RE | Admit: 2022-03-14 | Discharge: 2022-03-14 | Disposition: A | Discharge status: Home / Self Care | Payer: PPO | Source: Ambulatory Visit | Attending: Radiation Oncology | Admitting: Radiation Oncology

## 2022-03-14 DIAGNOSIS — C109 Malignant neoplasm of oropharynx, unspecified: Secondary | ICD-10-CM | POA: Diagnosis not present

## 2022-03-14 DIAGNOSIS — Z5111 Encounter for antineoplastic chemotherapy: Secondary | ICD-10-CM | POA: Diagnosis not present

## 2022-03-14 DIAGNOSIS — Z510 Encounter for antineoplastic radiation therapy: Secondary | ICD-10-CM | POA: Diagnosis not present

## 2022-03-14 DIAGNOSIS — C01 Malignant neoplasm of base of tongue: Secondary | ICD-10-CM | POA: Diagnosis not present

## 2022-03-14 LAB — RAD ONC ARIA SESSION SUMMARY
Course Elapsed Days: 5
Plan Fractions Treated to Date: 4
Plan Prescribed Dose Per Fraction: 2 Gy
Plan Total Fractions Prescribed: 35
Plan Total Prescribed Dose: 70 Gy
Reference Point Dosage Given to Date: 8 Gy
Reference Point Session Dosage Given: 2 Gy
Session Number: 4

## 2022-03-14 MED ORDER — SONAFINE EX EMUL
1.0000 | Freq: Two times a day (BID) | CUTANEOUS | Status: DC
  Administered 2022-03-14: 1 via TOPICAL

## 2022-03-15 ENCOUNTER — Inpatient Hospital Stay (HOSPITAL_BASED_OUTPATIENT_CLINIC_OR_DEPARTMENT_OTHER): Payer: PPO | Admitting: Hematology and Oncology

## 2022-03-15 ENCOUNTER — Other Ambulatory Visit: Payer: Self-pay

## 2022-03-15 ENCOUNTER — Inpatient Hospital Stay: Payer: PPO

## 2022-03-15 ENCOUNTER — Ambulatory Visit
Admission: RE | Admit: 2022-03-15 | Discharge: 2022-03-15 | Disposition: A | Discharge status: Home / Self Care | Payer: PPO | Source: Ambulatory Visit | Attending: Radiation Oncology | Admitting: Radiation Oncology

## 2022-03-15 VITALS — BP 107/63 | HR 89 | Temp 98.7°F | Wt 276.3 lb

## 2022-03-15 DIAGNOSIS — C01 Malignant neoplasm of base of tongue: Secondary | ICD-10-CM

## 2022-03-15 DIAGNOSIS — C109 Malignant neoplasm of oropharynx, unspecified: Secondary | ICD-10-CM | POA: Diagnosis not present

## 2022-03-15 DIAGNOSIS — K5900 Constipation, unspecified: Secondary | ICD-10-CM

## 2022-03-15 DIAGNOSIS — Z510 Encounter for antineoplastic radiation therapy: Secondary | ICD-10-CM | POA: Diagnosis not present

## 2022-03-15 DIAGNOSIS — Z5111 Encounter for antineoplastic chemotherapy: Secondary | ICD-10-CM | POA: Diagnosis not present

## 2022-03-15 LAB — RAD ONC ARIA SESSION SUMMARY
Course Elapsed Days: 6
Plan Fractions Treated to Date: 5
Plan Prescribed Dose Per Fraction: 2 Gy
Plan Total Fractions Prescribed: 35
Plan Total Prescribed Dose: 70 Gy
Reference Point Dosage Given to Date: 10 Gy
Reference Point Session Dosage Given: 2 Gy
Session Number: 5

## 2022-03-15 LAB — CBC WITH DIFFERENTIAL (CANCER CENTER ONLY)
Abs Immature Granulocytes: 0.08 10*3/uL — ABNORMAL HIGH (ref 0.00–0.07)
Basophils Absolute: 0 10*3/uL (ref 0.0–0.1)
Basophils Relative: 0 %
Eosinophils Absolute: 0.6 10*3/uL — ABNORMAL HIGH (ref 0.0–0.5)
Eosinophils Relative: 5 %
HCT: 40.3 % (ref 39.0–52.0)
Hemoglobin: 14 g/dL (ref 13.0–17.0)
Immature Granulocytes: 1 %
Lymphocytes Relative: 9 %
Lymphs Abs: 1 10*3/uL (ref 0.7–4.0)
MCH: 32.2 pg (ref 26.0–34.0)
MCHC: 34.7 g/dL (ref 30.0–36.0)
MCV: 92.6 fL (ref 80.0–100.0)
Monocytes Absolute: 1 10*3/uL (ref 0.1–1.0)
Monocytes Relative: 9 %
Neutro Abs: 8.6 10*3/uL — ABNORMAL HIGH (ref 1.7–7.7)
Neutrophils Relative %: 76 %
Platelet Count: 201 10*3/uL (ref 150–400)
RBC: 4.35 MIL/uL (ref 4.22–5.81)
RDW: 12.7 % (ref 11.5–15.5)
WBC Count: 11.2 10*3/uL — ABNORMAL HIGH (ref 4.0–10.5)
nRBC: 0 % (ref 0.0–0.2)

## 2022-03-15 LAB — BASIC METABOLIC PANEL - CANCER CENTER ONLY
Anion gap: 6 (ref 5–15)
BUN: 23 mg/dL (ref 8–23)
CO2: 32 mmol/L (ref 22–32)
Calcium: 8.9 mg/dL (ref 8.9–10.3)
Chloride: 95 mmol/L — ABNORMAL LOW (ref 98–111)
Creatinine: 0.87 mg/dL (ref 0.61–1.24)
GFR, Estimated: >60 mL/min (ref >60)
Glucose, Bld: 87 mg/dL (ref 70–99)
Potassium: 4.2 mmol/L (ref 3.5–5.1)
Sodium: 133 mmol/L — ABNORMAL LOW (ref 135–145)

## 2022-03-15 LAB — MAGNESIUM: Magnesium: 2 mg/dL (ref 1.7–2.4)

## 2022-03-15 MED ORDER — HEPARIN SOD (PORK) LOCK FLUSH 100 UNIT/ML IV SOLN
500.0000 [IU] | Freq: Once | INTRAVENOUS | Status: AC
  Administered 2022-03-15: 500 [IU] via INTRAVENOUS

## 2022-03-15 MED ORDER — SODIUM CHLORIDE 0.9% FLUSH
10.0000 mL | INTRAVENOUS | Status: DC | PRN
  Administered 2022-03-15: 10 mL via INTRAVENOUS

## 2022-03-15 MED FILL — Dexamethasone Sodium Phosphate Inj 100 MG/10ML: INTRAMUSCULAR | Qty: 1 | Status: AC

## 2022-03-15 MED FILL — Fosaprepitant Dimeglumine For IV Infusion 150 MG (Base Eq): INTRAVENOUS | Qty: 5 | Status: AC

## 2022-03-15 NOTE — Progress Notes (Signed)
Mr. Fancher was monitored during treatment. Patient tolerated well. Did not show any signs of distress during or after treatment. Patient denies any  issues. States that he feels fine.Heartrate was 79 to 89 during treatment.

## 2022-03-15 NOTE — Assessment & Plan Note (Signed)
Continue MiraLAX as recommended by Dr. Isidore Moos.  If this does not improve by Friday, he was encouraged to contact us.  We can also try Dulcolax suppository which she was not keen on at this time.  He is not uncomfortable.  His only complaint today is bloating, his continues to pass gas, no nausea or vomiting.

## 2022-03-15 NOTE — Progress Notes (Signed)
Clarence Andrade NOTE  Patient Care Team: Kathalene Frames, MD as PCP - General (Internal Medicine) Sueanne Margarita, MD as PCP - Cardiology (Cardiology) Constance Haw, MD as PCP - Electrophysiology (Cardiology) Malmfelt, Stephani Police, RN as Oncology Nurse Navigator Benay Pike, MD as Consulting Physician (Hematology and Oncology) [name], MD as Consulting Physician (Radiation Oncology)  CHIEF COMPLAINTS/PURPOSE OF CONSULTATION:  Squamous cell carcinoma of the oropharynx  ASSESSMENT & PLAN:  Malignant neoplasm of base of tongue [facility]) This is a very pleasant 76 year old male patient with 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  PET/CT without any evidence of distant metastatic disease.  He is currently on concurrent chemo and radiation, status post 1 weekly cycle of cisplatin.  No adverse effects from chemotherapy except for some drop in energy after he discontinued dexamethasone and constipation.  Labs reviewed from today okay to proceed with chemotherapy as planned for tomorrow.  Constipation Continue MiraLAX as recommended by Dr. Isidore Moos.  If this does not improve by Friday, he was encouraged to contact us.  We can also try Dulcolax suppository which she was not keen on at this time.  He is not uncomfortable.  His only complaint today is bloating, his continues to pass gas, no nausea or vomiting.  Orders Placed This Encounter  Procedures   CBC with Differential ([place] Only)    Standing Status:   Future    Standing Expiration Date:   29/92/4268   Basic Metabolic Panel - [place] Only    Standing Status:   Future    Standing Expiration Date:   03/31/2023   Magnesium    Standing Status:   Future    Standing Expiration Date:   03/31/2023   CBC with Differential ([place] Only)    Standing Status:   Future     Standing Expiration Date:   34/19/6222   Basic Metabolic Panel - [place] Only    Standing Status:   Future    Standing Expiration Date:   04/07/2023   Magnesium    Standing Status:   Future    Standing Expiration Date:   04/07/2023   CBC with Differential ([facility] Only)    Standing Status:   Future    Standing Expiration Date:   97/01/8920   Basic Metabolic Panel - [place] Only    Standing Status:   Future    Standing Expiration Date:   04/14/2023   Magnesium    Standing Status:   Future    Standing Expiration Date:   04/14/2023   CBC with Differential ([facility] Only)    Standing Status:   Future    Standing Expiration Date:   19/41/7408   Basic Metabolic Panel - [facility] Only    Standing Status:   Future    Standing Expiration Date:   04/21/2023   Magnesium    Standing Status:   Future    Standing Expiration Date:   04/21/2023   Malignant neoplasm of base of tongue ([place]) This is a very pleasant 76 year old male patient with 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  PET/CT without any evidence of distant metastatic disease.  He is currently on concurrent chemo and radiation, status post 1 weekly cycle of cisplatin.  No adverse effects from chemotherapy  except for some drop in energy after he discontinued dexamethasone and constipation.  Labs reviewed from today okay to proceed with chemotherapy as planned for tomorrow.  Constipation Continue MiraLAX as recommended by Dr. Isidore Moos.  If this does not improve by Friday, he was encouraged to contact us.  We can also try Dulcolax suppository which she was not keen on at this time.  He is not uncomfortable.  His only complaint today is bloating, his continues to pass gas, no nausea or vomiting.   Patient would like to not take dexamethasone for next cycle.  We will see how this goes.  He was  instructed to give [place] a call if he has nausea or vomiting.  He expressed understanding.  Overall so far there appears to be some response to treatment.  HISTORY OF PRESENTING ILLNESS:  Clarence Andrade 76 y.o. male is here because of SCC oropharynx.  Oncologic History   This is a very pleasant 76 year old male patient who was seen by ENT with chief complaint of lump in his neck ongoing for the past 1 to 2 months.  Around July he suddenly noticed the lump in the neck.  He was initially treated by antibiotics but this has not changed.  He also has started noticing some difficulty swallowing and some dysgeusia.  He tells me that he has lost about 20 pounds of weight in the past 2 to 3 months but part of it could be intentional.  He was seen by Dr. Synetta Shadow on September 20 for an initial consultation and he had flexible fiberoptic endoscopy the same day which noticed large base of tongue tumor.  This tumor measured at least 2 to 3 cm in diameter extending across midline.  He also had a CT on September 27 which confirmed 4 cm mainly exophytic mass centered at the left base of the tongue with at least 1 ipsilateral malignant node measuring nearly 4 cm with signs of extracapsular extension.  Findings consistent with metastatic squamous cell carcinoma.  PET/CT done on October 13 with no findings of metastatic disease, large left-sided hypermetabolic oropharyngeal neoplasm and adjacent left-sided metastatic lymphadenopathy.  Interval history  Patient is here for follow-up after first cycle of cisplatin.  He tolerated it well overall.  He used the dexamethasone and he felt really well for the 3 days he was on dexamethasone but after he stopped it, he felt like he nearly collapsed hence he does not want to keep taking dexamethasone for the next few cycles of chemotherapy if this is allowed.  Besides this he also noted constipation, last good bowel movement was a week ago.  He was asked to continue MiraLAX,  he has been using this.  He is not uncomfortable except for bloating, passing gas, no nausea or vomiting. Other than that he has noticed improvement in the pain radiating to the left ear. Rest of the pertinent 10 point ROS reviewed and negative   REVIEW OF SYSTEMS:   Constitutional: Denies fevers, chills or abnormal night sweats Eyes: Denies blurriness of vision, double vision or watery eyes Ears, nose, mouth, throat, and face: Denies mucositis or sore throat Respiratory: Denies cough, dyspnea or wheezes Cardiovascular: Denies palpitation, chest discomfort or lower extremity swelling Gastrointestinal:  Denies nausea, heartburn or change in bowel habits Skin: Denies abnormal skin rashes Lymphatics: Denies new lymphadenopathy or easy bruising Neurological:Denies numbness, tingling or new weaknesses Behavioral/Psych: Mood is stable, no new changes  All other systems were reviewed with the patient and are  negative.  MEDICAL HISTORY:  Past Medical History:  Diagnosis Date   Abdominal hernia    Midline   Adrenal incidentaloma ([place]) 2010   Aortic stenosis    Ascending aortic aneurysm ([place])    a. 4.4cm by imaging 03/2020.   CAD (coronary artery disease)    a. Cath 03/2018 - CTO of the prox-mid LAD with bridging, L-L and R-L collaterals, moderate, non-obstructive disease involving the proximal LAD, proximal LCx, and distal RCA, more severe disease noted involving small branch vessels (RV marginal and superior branch of OM3).   Complete heart block (HCC)    Gallstones    Heart murmur    History of pacemaker    a. 11/2018 - Medtronic, intermittent CHB.   Hyperlipidemia    Morbid obesity (HCC)    Myocardial infarction [facility])    NSVT (nonsustained ventricular tachycardia) (HCC)    OSA (obstructive sleep apnea)    Severe PSG 1/07 AHI 66/hr, O2 Nadir 50% Refuses treatment - Pt does not believe test was accurate   Pericarditis    Pneumonia    Pulmonary HTN (HCC)    mild with PASP 4mHg by  echo 02/2018, not seen on 03/2020 echo   Pulmonary nodule    Screening for AAA (abdominal aortic aneurysm) 2010   CT Abd/Pelvis   Ventral hernia    03/2009    SURGICAL HISTORY: Past Surgical History:  Procedure Laterality Date   CHOLECYSTECTOMY N/A 10/11/2019   Procedure: LAPAROSCOPIC CHOLECYSTECTOMY WITH INTRAOPERATIVE CHOLANGIOGRAM;  Surgeon: GArmandina Gemma MD;  Location: [facility];  Service: General;  Laterality: N/A;   CORONARY ANGIOGRAPHY N/A 03/23/2018   Procedure: CORONARY ANGIOGRAPHY;  Surgeon: ENelva Bush MD;  [facility];  Service: Cardiovascular;  Laterality: N/A;   HERNIA REPAIR  05/2002   three   IR GASTROSTOMY TUBE MOD SED  03/08/2022   IR IMAGING GUIDED PORT INSERTION  03/08/2022   KNEE ARTHROSCOPY Left 09/2002   PACEMAKER IMPLANT N/A 11/08/2018   Procedure: PACEMAKER IMPLANT;  Surgeon: CConstance Haw MD;  [facility];  Service: Cardiovascular;  Laterality: N/A;   SHOULDER SURGERY Right 2020   TOTAL KNEE ARTHROPLASTY Bilateral 09/2010   VENTRAL HERNIA REPAIR  03/2009    SOCIAL HISTORY: Social History   Socioeconomic History   Marital status: Married    Spouse name: Not on file   Number of children: Not on file   Years of education: Not on file   Highest education level: Not on file  Occupational History   Not on file  Tobacco Use   Smoking status: Former    Packs/day: 0.50    Types: Cigarettes    Quit date: 126   Years since quitting: 30.8   Smokeless tobacco: Never  Vaping Use   Vaping Use: Never used  Substance and Sexual Activity   Alcohol use: No   Drug use: No   Sexual activity: Not Currently  Other Topics Concern   Not on file  Social History Narrative   Not on file   Social Determinants of Health   Financial Resource Strain: Low Risk  (02/24/2022)   Overall Financial Resource Strain (CARDIA)    Difficulty of Paying Living Expenses: Not hard at all  Food Insecurity: No Food Insecurity  (02/24/2022)   Hunger Vital Sign    Worried About Running Out of Food in the Last Year: Never true    Ran Out of Food in the Last Year: Never true  Transportation Needs: No  Transportation Needs (02/24/2022)   PRAPARE - Hydrologist (Medical): No    Lack of Transportation (Non-Medical): No  Physical Activity: Not on file  Stress: Not on file  Social Connections: Not on file  Intimate Partner Violence: Not on file    FAMILY HISTORY: Family History  Problem Relation Age of Onset   Heart attack Father    Heart disease Father    Aneurysm Father    Breast cancer Sister    Colon cancer Sister     ALLERGIES:  is allergic to sertraline hcl.  MEDICATIONS:  Current Outpatient Medications  Medication Sig Dispense Refill   acetaminophen (TYLENOL) 500 MG tablet Take 1,000 mg by mouth every 6 (six) hours as needed (pain).     aspirin EC 81 MG tablet Take 81 mg by mouth daily.     buPROPion (WELLBUTRIN XL) 150 MG 24 hr tablet Take 150 mg by mouth every morning.     COVID-19 mRNA bivalent vaccine, Pfizer, (PFIZER COVID-19 VAC BIVALENT) injection Inject into the muscle. 0.3 mL 0   COVID-19 mRNA Vac-TriS, Pfizer, (PFIZER-BIONT COVID-19 VAC-TRIS) SUSP injection Inject into the muscle. 0.3 mL 0   COVID-19 mRNA vaccine 2023-2024 (COMIRNATY) syringe Inject into the muscle. 0.3 mL 0   dexamethasone (DECADRON) 4 MG tablet Take 2 tablets daily x 3 days starting the day after cisplatin chemotherapy. Take with food. 30 tablet 1   influenza vaccine adjuvanted (FLUAD) 0.5 ML injection Inject into the muscle. 0.5 mL 0   isosorbide mononitrate (IMDUR) 30 MG 24 hr tablet TAKE 1 TABLET ONCE DAILY. 90 tablet 0   levofloxacin (LEVAQUIN) 500 MG tablet Take 500 mg by mouth daily.     lidocaine-prilocaine (EMLA) cream Apply to affected area once 30 g 3   metoprolol succinate (TOPROL-XL) 25 MG 24 hr tablet Take 0.5 tablets (12.5 mg total) by mouth daily. 90 tablet 3   Multiple Vitamin  (MULTIVITAMIN) tablet Take 1 tablet by mouth daily.     nitroGLYCERIN (NITROSTAT) 0.4 MG SL tablet Place 1 tablet (0.4 mg total) under the tongue every 5 (five) minutes as needed for chest pain. 25 tablet 3   ondansetron (ZOFRAN) 8 MG tablet Take 1 tablet (8 mg total) by mouth every 8 (eight) hours as needed for nausea or vomiting. Start on the third day after cisplatin. 30 tablet 1   prochlorperazine (COMPAZINE) 10 MG tablet Take 1 tablet (10 mg total) by mouth every 6 (six) hours as needed (Nausea or vomiting). 30 tablet 1   rosuvastatin (CRESTOR) 10 MG tablet TAKE 1 TABLET ONCE DAILY. 90 tablet 0   RSV vaccine recomb adjuvanted (AREXVY) 120 MCG/0.5ML injection Inject into the muscle. 0.5 mL 0   Sulfamethoxazole-Trimethoprim (SULFAMETHOXAZOLE-TMP DS PO) Take by mouth.     tamsulosin (FLOMAX) 0.4 MG CAPS capsule Take 0.4 mg by mouth daily.     temazepam (RESTORIL) 15 MG capsule Take 1 capsule (15 mg total) by mouth at bedtime as needed for sleep. 30 capsule 0   No current facility-administered medications for this visit.     PHYSICAL EXAMINATION: ECOG PERFORMANCE STATUS: 0 - Asymptomatic  Vitals:   03/15/22 1537  BP: 107/63  Pulse: 89  Temp: 98.7 F (37.1 C)  SpO2: 96%   Filed Weights   03/15/22 1537  Weight: 276 lb 4.8 oz (125.3 kg)    Physical Exam Constitutional:      Appearance: Normal appearance.  Cardiovascular:     Rate and Rhythm: Normal rate  and regular rhythm.     Pulses: Normal pulses.     Heart sounds: Normal heart sounds.  Abdominal:     Comments: G-tube appears well today.  Musculoskeletal:        General: Normal range of motion.     Cervical back: Normal range of motion and neck supple. No rigidity.  Lymphadenopathy:     Cervical: Cervical adenopathy (Cervical lymphadenopathy appears to be improving on exam today) present.  Skin:    General: Skin is warm and dry.  Neurological:     Mental Status: He is alert.    LABORATORY DATA:  I have reviewed the  data as listed Lab Results  Component Value Date   WBC 11.2 (H) 03/15/2022   HGB 14.0 03/15/2022   HCT 40.3 03/15/2022   MCV 92.6 03/15/2022   PLT 201 03/15/2022     Chemistry      Component Value Date/Time   NA 133 (L) 03/15/2022 1405   NA 140 10/13/2021 0954   K 4.2 03/15/2022 1405   CL 95 (L) 03/15/2022 1405   CO2 32 03/15/2022 1405   BUN 23 03/15/2022 1405   BUN 20 10/13/2021 0954   CREATININE 0.87 03/15/2022 1405      Component Value Date/Time   CALCIUM 8.9 03/15/2022 1405   ALKPHOS 73 03/30/2021 1119   AST 20 03/30/2021 1119   ALT 18 03/30/2021 1119   BILITOT 0.4 03/30/2021 1119       RADIOGRAPHIC STUDIES: I have personally reviewed the radiological images as listed and agreed with the findings in the report. IR Gastrostomy Tube  Result Date: 03/08/2022 INDICATION: Head and neck cancer. EXAM: PERCUTANEOUS GASTROSTOMY TUBE PLACEMENT COMPARISON:  PET-CT, 02/18/2022. MEDICATIONS: Ancef 3 gm IV; Antibiotics were administered within 1 hour of the procedure. 0.5 mg glucagon IV was administered. CONTRAST:  20 mL mL of Isovue 300 administered into the gastric lumen. ANESTHESIA/SEDATION: Moderate (conscious) sedation was employed during this procedure. A total of Versed 3 mg and Fentanyl 150 mcg was administered intravenously. Moderate Sedation Time: 37 minutes. The patient's level of consciousness and vital signs were monitored continuously by radiology nursing throughout the procedure under my direct supervision. FLUOROSCOPY TIME:  Fluoroscopic dose; 4 mGy COMPLICATIONS: None immediate. PROCEDURE: Informed written consent was obtained from the patient and/or patient's representative following explanation of the procedure, risks, benefits and alternatives. A time out was performed prior to the initiation of the procedure. Maximal barrier sterile technique utilized including caps, mask, sterile gowns, sterile gloves, large sterile drape, hand hygiene and Betadine prep. The LEFT upper  quadrant was sterilely prepped and draped. A oral gastric catheter was inserted into the stomach under fluoroscopy. The existing nasogastric feeding tube was removed. The left costal margin and barium opacified transverse colon were identified and avoided. Air was injected into the stomach for insufflation and visualization under fluoroscopy. Under sterile conditions and local anesthesia, 2 T tacks were utilized to pexy the anterior aspect of the stomach against the ventral abdominal wall. Contrast injection confirmed appropriate positioning of each of the T tacks. An incision was made between the T tacks and a 17 gauge trocar needle was utilized to access the stomach. Needle position was confirmed within the stomach with aspiration of air and injection of a small amount of contrast. A stiff Glidewire was advanced into the gastric lumen and under intermittent fluoroscopic guidance, then was exchanged for a telescoping peel-away sheath, ultimately allowing placement of a 18 Fr balloon retention gastrostomy tube. The retention balloon  was insufflated with a mixture of dilute saline and contrast and pulled taut against the anterior wall of the stomach. The external disc was cinched. Contrast injection confirms positioning within the stomach. Several spot radiographic images were obtained in various obliquities for documentation. The patient tolerated procedure well without immediate post procedural complication. FINDINGS: After successful fluoroscopic guided placement, the gastrostomy tube is appropriately positioned with internal retention balloon against the ventral aspect of the gastric lumen. IMPRESSION: Successful placement of an 18 Fr balloon retention percutaneous gastrostomy tube, as above. PLAN: The gastrostomy may be used immediately for medication administration and in 24 hrs for the initiation of feeds. The patient will return to Vascular Interventional Radiology (VIR) for routine gastrostomy exchange in 6  months. Michaelle Birks, MD [facility] Radiology Electronically Signed   By: Michaelle Birks M.D.   On: 03/08/2022 19:36   IR IMAGING GUIDED PORT INSERTION  Result Date: 03/08/2022 INDICATION: Head and neck cancer EXAM: IMPLANTED PORT A CATH PLACEMENT WITH ULTRASOUND AND FLUOROSCOPIC GUIDANCE MEDICATIONS: None ANESTHESIA/SEDATION: Procedure performed concurrently with gastrostomy placement. For combined sedation details, including medication doses and time, please see concurrent percutaneous gastrostomy report. FLUOROSCOPY TIME:  Fluoroscopic dose; 0 mGy COMPLICATIONS: None immediate. PROCEDURE: The procedure, risks, benefits, and alternatives were explained to the patient. Questions regarding the procedure were encouraged and answered. The patient understands and consents to the procedure. The RIGHT neck and chest were prepped with chlorhexidine in a sterile fashion, and a sterile drape was applied covering the operative field. Maximum barrier sterile technique with sterile gowns and gloves were used for the procedure. A timeout was performed prior to the initiation of the procedure. Local anesthesia was provided with 1% lidocaine with epinephrine. After creating a small venotomy incision, a micropuncture kit was utilized to access the internal jugular vein under direct, real-time ultrasound guidance. Ultrasound image documentation was performed. The microwire was kinked to measure appropriate catheter length. A subcutaneous port pocket was then created along the upper chest wall utilizing a combination of sharp and blunt dissection. The pocket was irrigated with sterile saline. A single lumen ISP power injectable port was chosen for placement. The 8 Fr catheter was tunneled from the port pocket site to the venotomy incision. The port was placed in the pocket. The external catheter was trimmed to appropriate length. At the venotomy, an 8 Fr peel-away sheath was placed  over a guidewire under fluoroscopic guidance. The catheter was then placed through the sheath and the sheath was removed. Final catheter positioning was confirmed and documented with a fluoroscopic spot radiograph. The port was accessed with a Huber needle, aspirated and flushed with heparinized saline. The port pocket incision was closed with interrupted 3-0 Vicryl suture then Dermabond was applied, including at the venotomy incision. Dressings were placed. The patient tolerated the procedure well without immediate post procedural complication. IMPRESSION: Successful placement of a RIGHT internal jugular approach power injectable Port-A-Cath. The tip of the catheter is positioned within the proximal RIGHT atrium. The catheter is ready for immediate use. Michaelle Birks, MD [facility] Radiology Electronically Signed   By: Michaelle Birks M.D.   On: 03/08/2022 19:30   CUP PACEART INCLINIC DEVICE CHECK  Result Date: 02/22/2022 Pacemaker check in clinic. Normal device function. Thresholds, sensing, impedances consistent with previous measurements. Device programmed to maximize longevity. Afib burden <0.1%.  Few brief high ventricular rates noted-see attachment. Device programmed at appropriate safety margins. Histogram distribution appropriate for patient activity level.  Device programmed to optimize intrinsic conduction. Estimated longevity 11.3 years. Patient enrolled in remote follow-up. Patient education completed.Myrtie Hawk, BSN, RN  NM PET Image Initial (PI) Skull Base To Thigh (F-18 FDG)  Result Date: 02/20/2022 CLINICAL DATA:  Initial treatment strategy for oropharyngeal neoplasm. EXAM: NUCLEAR MEDICINE PET SKULL BASE TO THIGH TECHNIQUE: 13.79 mCi F-18 FDG was injected intravenously. Full-ring PET imaging was performed from the skull base to thigh after the radiotracer. CT data was obtained and used for attenuation correction and anatomic localization.  Fasting blood glucose: 111 mg/dl COMPARISON:  Neck CT 02/02/2022 FINDINGS: Mediastinal blood pool activity: SUV max 2.87 Liver activity: SUV max NA NECK: Extensive hypermetabolic tumor involving the left tonsillar region, base of the tongue, the left vallecular and probable left-sided the uvula. The SUV max is 21.80. There is also a large hypermetabolic nodal mass in the left level 2 region. SUV max is 21.16. No right-sided neck adenopathy. No lower left neck adenopathy. Incidental CT findings: None. CHEST: No hypermetabolic mediastinal or hilar nodes. No suspicious pulmonary nodules on the CT scan. Incidental CT findings: The pacer wires are in good position without complicating features. Aortic and coronary artery calcifications are noted. No acute pulmonary findings. No pleural effusion. ABDOMEN/PELVIS: No abnormal hypermetabolic activity within the liver, pancreas, adrenal glands, or spleen. No hypermetabolic lymph nodes in the abdomen or pelvis. Low Incidental CT findings: Moderate atherosclerotic calcifications involving the aorta and iliac arteries. No aneurysm. Status post cholecystectomy. No biliary dilatation. Sigmoid colon diverticulosis. Enlarged prostate gland with median lobe hypertrophy impressing on the base of the bladder. SKELETON: No findings for osseous metastatic disease. There is diffuse marrow uptake which could be related to chemotherapy. Incidental CT findings: None. IMPRESSION: 1. Large left-sided markedly hypermetabolic oropharyngeal neoplasm and associated adjacent left-sided metastatic lymphadenopathy. 2. No findings for metastatic disease involving the chest, abdomen/pelvis or bony structures. Electronically Signed   By: Marijo Sanes M.D.   On: 02/20/2022 18:44    All questions were answered. The patient knows to call the clinic with any problems, questions or concerns. I spent 30 minutes in the care of this patient including H and P, review of records, counseling and coordination  of care.     Benay Pike, MD 03/15/2022 4:16 PM

## 2022-03-15 NOTE — Assessment & Plan Note (Signed)
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  PET/CT without any evidence of distant metastatic disease.  He is currently on concurrent chemo and radiation, status post 1 weekly cycle of cisplatin.  No adverse effects from chemotherapy except for some drop in energy after he discontinued dexamethasone and constipation.  Labs reviewed from today okay to proceed with chemotherapy as planned for tomorrow.

## 2022-03-16 ENCOUNTER — Other Ambulatory Visit: Payer: Self-pay

## 2022-03-16 ENCOUNTER — Inpatient Hospital Stay: Payer: PPO | Admitting: Dietician

## 2022-03-16 ENCOUNTER — Ambulatory Visit
Admission: RE | Admit: 2022-03-16 | Discharge: 2022-03-16 | Disposition: A | Discharge status: Home / Self Care | Payer: PPO | Source: Ambulatory Visit | Attending: Radiation Oncology | Admitting: Radiation Oncology

## 2022-03-16 ENCOUNTER — Inpatient Hospital Stay: Payer: PPO

## 2022-03-16 VITALS — BP 87/60 | HR 94 | Temp 98.2°F | Resp 18

## 2022-03-16 DIAGNOSIS — C109 Malignant neoplasm of oropharynx, unspecified: Secondary | ICD-10-CM | POA: Diagnosis not present

## 2022-03-16 DIAGNOSIS — C01 Malignant neoplasm of base of tongue: Secondary | ICD-10-CM

## 2022-03-16 DIAGNOSIS — Z510 Encounter for antineoplastic radiation therapy: Secondary | ICD-10-CM | POA: Diagnosis not present

## 2022-03-16 DIAGNOSIS — Z5111 Encounter for antineoplastic chemotherapy: Secondary | ICD-10-CM | POA: Diagnosis not present

## 2022-03-16 LAB — RAD ONC ARIA SESSION SUMMARY
Course Elapsed Days: 7
Plan Fractions Treated to Date: 6
Plan Prescribed Dose Per Fraction: 2 Gy
Plan Total Fractions Prescribed: 35
Plan Total Prescribed Dose: 70 Gy
Reference Point Dosage Given to Date: 12 Gy
Reference Point Session Dosage Given: 2 Gy
Session Number: 6

## 2022-03-16 MED ORDER — POTASSIUM CHLORIDE IN NACL 20-0.9 MEQ/L-% IV SOLN
Freq: Once | INTRAVENOUS | Status: AC
  Filled 2022-03-16: qty 1000

## 2022-03-16 MED ORDER — PALONOSETRON HCL INJECTION 0.25 MG/5ML
0.2500 mg | Freq: Once | INTRAVENOUS | Status: AC
  Administered 2022-03-16: 0.25 mg via INTRAVENOUS
  Filled 2022-03-16: qty 5

## 2022-03-16 MED ORDER — SODIUM CHLORIDE 0.9 % IV SOLN
10.0000 mg | Freq: Once | INTRAVENOUS | Status: AC
  Administered 2022-03-16: 10 mg via INTRAVENOUS
  Filled 2022-03-16: qty 10

## 2022-03-16 MED ORDER — MAGNESIUM SULFATE 2 GM/50ML IV SOLN
2.0000 g | Freq: Once | INTRAVENOUS | Status: AC
  Administered 2022-03-16: 2 g via INTRAVENOUS
  Filled 2022-03-16: qty 50

## 2022-03-16 MED ORDER — SODIUM CHLORIDE 0.9 % IV SOLN
150.0000 mg | Freq: Once | INTRAVENOUS | Status: AC
  Administered 2022-03-16: 150 mg via INTRAVENOUS
  Filled 2022-03-16: qty 150

## 2022-03-16 MED ORDER — HEPARIN SOD (PORK) LOCK FLUSH 100 UNIT/ML IV SOLN
500.0000 [IU] | Freq: Once | INTRAVENOUS | Status: AC | PRN
  Administered 2022-03-16: 500 [IU]

## 2022-03-16 MED ORDER — SODIUM CHLORIDE 0.9 % IV SOLN: Freq: Once | INTRAVENOUS | Status: AC

## 2022-03-16 MED ORDER — SODIUM CHLORIDE 0.9 % IV SOLN
39.5000 mg/m2 | Freq: Once | INTRAVENOUS | Status: AC
  Administered 2022-03-16: 100 mg via INTRAVENOUS
  Filled 2022-03-16: qty 100

## 2022-03-16 MED ORDER — SODIUM CHLORIDE 0.9% FLUSH
10.0000 mL | INTRAVENOUS | Status: DC | PRN
  Administered 2022-03-16: 10 mL

## 2022-03-16 NOTE — Progress Notes (Signed)
Nutrition Follow-up:  Patient with newly diagnosed stage II SCC of oropharynx. He is currently concurrent chemoradiation with weekly cisplatin (first treatment 11/1). S/p G-tube placement 10/31.   Met with patient during infusion. He is feeling fine today, reports tolerating therapy well overall. Patient says energy levels come and go through out the day. He continues going to the gym a few days a week with his wife. Patient is constipated and is taking miralax. He recalls several small bowel movements yesterday and one this morning. These were not formed stools. His last good BM was one week ago (11/1). He is passing gas. Patient is drinking lots of water despite it having a bad after taste now. He is flushing tube daily with 60 ml water. He is not using tube for feedings at this time. Patient reports a couple days with decreased appetite, but says he is eating well and drinking one protein shake. Patient endorses bland taste of foods. He denies sore throat, painful swallow, dry mouth. Yesterday he had eggs, bacon, toast for breakfast, supplement for lunch, 1/2 bowl of chicken/rice soup for supper. Patient recalls waffles and pack of honey roasted nuts this morning. He is feeling hungry at visit and agreeable to yogurt. Patient politely declined Ensure.   Medications: reviewed  Labs: Na 133  Anthropometrics: Weight 276 lb 4.8 oz on 11/7 decreased 2.8% (8 lbs) in 6 days -  this is severe  11/1 - 284 lb 4 oz    NUTRITION DIAGNOSIS: Predicted suboptimal intake continues    INTERVENTION:  Reinforced importance of increased calorie/protein energy needs to maintain strength/weights Encouraged drinking protein shake for snack vs meal, recommend increasing Ensure for added calories. He is agreeable to drinking 2-3 Ensure Plus/equivalent  Reviewed strategies for altered taste, encouraged baking soda salt water rinses several times daily and before meals - additional copy of handout with tips  provided Discussed strategies for constipation, suggested he could try warmed 4 oz prune  Continue daily water flushes - recommend pt start using tube for supplemental nutrition with continued wt loss. He wishes to hold initiating tube feedings at this time Continue activity as able    MONITORING, EVALUATION, GOAL: weight trends, intake   NEXT VISIT: Wednesday November 15 during infusion

## 2022-03-17 ENCOUNTER — Other Ambulatory Visit: Payer: Self-pay

## 2022-03-17 ENCOUNTER — Ambulatory Visit
Admission: RE | Admit: 2022-03-17 | Discharge: 2022-03-17 | Disposition: A | Discharge status: Home / Self Care | Payer: PPO | Source: Ambulatory Visit | Attending: Radiation Oncology | Admitting: Radiation Oncology

## 2022-03-17 DIAGNOSIS — C01 Malignant neoplasm of base of tongue: Secondary | ICD-10-CM | POA: Diagnosis not present

## 2022-03-17 DIAGNOSIS — Z5111 Encounter for antineoplastic chemotherapy: Secondary | ICD-10-CM | POA: Diagnosis not present

## 2022-03-17 DIAGNOSIS — Z510 Encounter for antineoplastic radiation therapy: Secondary | ICD-10-CM | POA: Diagnosis not present

## 2022-03-17 DIAGNOSIS — C109 Malignant neoplasm of oropharynx, unspecified: Secondary | ICD-10-CM | POA: Diagnosis not present

## 2022-03-17 LAB — RAD ONC ARIA SESSION SUMMARY
Course Elapsed Days: 8
Plan Fractions Treated to Date: 7
Plan Prescribed Dose Per Fraction: 2 Gy
Plan Total Fractions Prescribed: 35
Plan Total Prescribed Dose: 70 Gy
Reference Point Dosage Given to Date: 14 Gy
Reference Point Session Dosage Given: 2 Gy
Session Number: 7

## 2022-03-18 ENCOUNTER — Other Ambulatory Visit: Payer: Self-pay

## 2022-03-18 ENCOUNTER — Ambulatory Visit
Admission: RE | Admit: 2022-03-18 | Discharge: 2022-03-18 | Disposition: A | Discharge status: Home / Self Care | Payer: PPO | Source: Ambulatory Visit | Attending: Radiation Oncology | Admitting: Radiation Oncology

## 2022-03-18 DIAGNOSIS — Z5111 Encounter for antineoplastic chemotherapy: Secondary | ICD-10-CM | POA: Diagnosis not present

## 2022-03-18 DIAGNOSIS — C01 Malignant neoplasm of base of tongue: Secondary | ICD-10-CM | POA: Diagnosis not present

## 2022-03-18 DIAGNOSIS — Z510 Encounter for antineoplastic radiation therapy: Secondary | ICD-10-CM | POA: Diagnosis not present

## 2022-03-18 DIAGNOSIS — C109 Malignant neoplasm of oropharynx, unspecified: Secondary | ICD-10-CM | POA: Diagnosis not present

## 2022-03-18 LAB — RAD ONC ARIA SESSION SUMMARY
Course Elapsed Days: 9
Plan Fractions Treated to Date: 8
Plan Prescribed Dose Per Fraction: 2 Gy
Plan Total Fractions Prescribed: 35
Plan Total Prescribed Dose: 70 Gy
Reference Point Dosage Given to Date: 16 Gy
Reference Point Session Dosage Given: 2 Gy
Session Number: 8

## 2022-03-21 ENCOUNTER — Other Ambulatory Visit: Payer: Self-pay

## 2022-03-21 ENCOUNTER — Ambulatory Visit
Admission: RE | Admit: 2022-03-21 | Discharge: 2022-03-21 | Disposition: A | Discharge status: Home / Self Care | Payer: PPO | Source: Ambulatory Visit | Attending: Radiation Oncology | Admitting: Radiation Oncology

## 2022-03-21 ENCOUNTER — Other Ambulatory Visit: Payer: Self-pay | Admitting: Radiation Oncology

## 2022-03-21 ENCOUNTER — Ambulatory Visit: Payer: PPO

## 2022-03-21 DIAGNOSIS — C01 Malignant neoplasm of base of tongue: Secondary | ICD-10-CM | POA: Diagnosis not present

## 2022-03-21 DIAGNOSIS — Z5111 Encounter for antineoplastic chemotherapy: Secondary | ICD-10-CM | POA: Diagnosis not present

## 2022-03-21 DIAGNOSIS — C109 Malignant neoplasm of oropharynx, unspecified: Secondary | ICD-10-CM | POA: Diagnosis not present

## 2022-03-21 DIAGNOSIS — Z510 Encounter for antineoplastic radiation therapy: Secondary | ICD-10-CM | POA: Diagnosis not present

## 2022-03-21 LAB — RAD ONC ARIA SESSION SUMMARY
Course Elapsed Days: 12
Plan Fractions Treated to Date: 9
Plan Prescribed Dose Per Fraction: 2 Gy
Plan Total Fractions Prescribed: 35
Plan Total Prescribed Dose: 70 Gy
Reference Point Dosage Given to Date: 18 Gy
Reference Point Session Dosage Given: 2 Gy
Session Number: 9

## 2022-03-21 MED ORDER — LIDOCAINE VISCOUS HCL 2 % MT SOLN: OROMUCOSAL | 3 refills | Status: DC

## 2022-03-22 ENCOUNTER — Inpatient Hospital Stay: Payer: PPO

## 2022-03-22 ENCOUNTER — Other Ambulatory Visit: Payer: Self-pay

## 2022-03-22 ENCOUNTER — Encounter: Payer: Self-pay | Admitting: Hematology and Oncology

## 2022-03-22 ENCOUNTER — Inpatient Hospital Stay (HOSPITAL_BASED_OUTPATIENT_CLINIC_OR_DEPARTMENT_OTHER): Payer: PPO | Admitting: Hematology and Oncology

## 2022-03-22 ENCOUNTER — Ambulatory Visit
Admission: RE | Admit: 2022-03-22 | Discharge: 2022-03-22 | Disposition: A | Discharge status: Home / Self Care | Payer: PPO | Source: Ambulatory Visit | Attending: Radiation Oncology | Admitting: Radiation Oncology

## 2022-03-22 DIAGNOSIS — C01 Malignant neoplasm of base of tongue: Secondary | ICD-10-CM

## 2022-03-22 DIAGNOSIS — C109 Malignant neoplasm of oropharynx, unspecified: Secondary | ICD-10-CM | POA: Diagnosis not present

## 2022-03-22 DIAGNOSIS — Z5111 Encounter for antineoplastic chemotherapy: Secondary | ICD-10-CM | POA: Diagnosis not present

## 2022-03-22 DIAGNOSIS — Z510 Encounter for antineoplastic radiation therapy: Secondary | ICD-10-CM | POA: Diagnosis not present

## 2022-03-22 LAB — CBC WITH DIFFERENTIAL (CANCER CENTER ONLY)
Abs Immature Granulocytes: 0.04 10*3/uL (ref 0.00–0.07)
Basophils Absolute: 0 10*3/uL (ref 0.0–0.1)
Basophils Relative: 0 %
Eosinophils Absolute: 0.1 10*3/uL (ref 0.0–0.5)
Eosinophils Relative: 1 %
HCT: 39.2 % (ref 39.0–52.0)
Hemoglobin: 13.3 g/dL (ref 13.0–17.0)
Immature Granulocytes: 0 %
Lymphocytes Relative: 6 %
Lymphs Abs: 0.6 10*3/uL — ABNORMAL LOW (ref 0.7–4.0)
MCH: 31.7 pg (ref 26.0–34.0)
MCHC: 33.9 g/dL (ref 30.0–36.0)
MCV: 93.6 fL (ref 80.0–100.0)
Monocytes Absolute: 0.5 10*3/uL (ref 0.1–1.0)
Monocytes Relative: 5 %
Neutro Abs: 9 10*3/uL — ABNORMAL HIGH (ref 1.7–7.7)
Neutrophils Relative %: 88 %
Platelet Count: 174 10*3/uL (ref 150–400)
RBC: 4.19 MIL/uL — ABNORMAL LOW (ref 4.22–5.81)
RDW: 12.8 % (ref 11.5–15.5)
WBC Count: 10.3 10*3/uL (ref 4.0–10.5)
nRBC: 0 % (ref 0.0–0.2)

## 2022-03-22 LAB — BASIC METABOLIC PANEL - CANCER CENTER ONLY
Anion gap: 4 — ABNORMAL LOW (ref 5–15)
BUN: 24 mg/dL — ABNORMAL HIGH (ref 8–23)
CO2: 31 mmol/L (ref 22–32)
Calcium: 9 mg/dL (ref 8.9–10.3)
Chloride: 101 mmol/L (ref 98–111)
Creatinine: 0.89 mg/dL (ref 0.61–1.24)
GFR, Estimated: >60 mL/min (ref >60)
Glucose, Bld: 98 mg/dL (ref 70–99)
Potassium: 4.7 mmol/L (ref 3.5–5.1)
Sodium: 136 mmol/L (ref 135–145)

## 2022-03-22 LAB — RAD ONC ARIA SESSION SUMMARY
Course Elapsed Days: 13
Plan Fractions Treated to Date: 10
Plan Prescribed Dose Per Fraction: 2 Gy
Plan Total Fractions Prescribed: 35
Plan Total Prescribed Dose: 70 Gy
Reference Point Dosage Given to Date: 20 Gy
Reference Point Session Dosage Given: 2 Gy
Session Number: 10

## 2022-03-22 LAB — MAGNESIUM: Magnesium: 1.8 mg/dL (ref 1.7–2.4)

## 2022-03-22 MED ORDER — HEPARIN SOD (PORK) LOCK FLUSH 100 UNIT/ML IV SOLN
500.0000 [IU] | Freq: Once | INTRAVENOUS | Status: AC
  Administered 2022-03-22: 500 [IU] via INTRAVENOUS

## 2022-03-22 MED ORDER — SODIUM CHLORIDE 0.9% FLUSH
10.0000 mL | INTRAVENOUS | Status: AC
  Administered 2022-03-22: 10 mL via INTRAVENOUS

## 2022-03-22 MED FILL — Dexamethasone Sodium Phosphate Inj 100 MG/10ML: INTRAMUSCULAR | Qty: 1 | Status: AC

## 2022-03-22 MED FILL — Fosaprepitant Dimeglumine For IV Infusion 150 MG (Base Eq): INTRAVENOUS | Qty: 5 | Status: AC

## 2022-03-22 NOTE — Assessment & Plan Note (Signed)
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  PET/CT without any evidence of distant metastatic disease.  He is currently on concurrent chemo and radiation, status post 2 weekly cycles of cisplatin.  No adverse effects from chemotherapy. With regards to dexamethasone, he tried a half dose of it and he felt pretty well. With regards to weight loss, he appears to have some fluctuations in weight, he was 278 pounds yesterday and 273 today.  We will continue to monitor.  He reports great appetite and good caloric intake. He is very pleased so far with clinical response with tremendous improvement in his cervical lymphadenopathy, improvement in otalgia and tinnitus. Labs appear satisfactory to proceed with chemo as planned for tomorrow.  Mucositis, mild at this time, continue viscous lidocaine as needed.

## 2022-03-22 NOTE — Progress Notes (Signed)
[facility] NOTE  Patient Care Team: Kathalene Frames, MD as PCP - General (Internal Medicine) Sueanne Margarita, MD as PCP - Cardiology (Cardiology) Constance Haw, MD as PCP - Electrophysiology (Cardiology) Malmfelt, Stephani Police, RN as Oncology Nurse Navigator Benay Pike, MD as Consulting Physician (Hematology and Oncology) [name], MD as Consulting Physician (Radiation Oncology)  CHIEF COMPLAINTS/PURPOSE OF CONSULTATION:  Squamous cell carcinoma of the oropharynx  ASSESSMENT & PLAN:  Malignant neoplasm of base of tongue [facility]) This is a very pleasant 76 year old male patient with 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  PET/CT without any evidence of distant metastatic disease.  He is currently on concurrent chemo and radiation, status post 2 weekly cycles of cisplatin.  No adverse effects from chemotherapy. With regards to dexamethasone, he tried a half dose of it and he felt pretty well. With regards to weight loss, he appears to have some fluctuations in weight, he was 278 pounds yesterday and 273 today.  We will continue to monitor.  He reports great appetite and good caloric intake. He is very pleased so far with clinical response with tremendous improvement in his cervical lymphadenopathy, improvement in otalgia and tinnitus. Labs appear satisfactory to proceed with chemo as planned for tomorrow.  Mucositis, mild at this time, continue viscous lidocaine as needed.  No orders of the defined types were placed in this encounter.  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  PET/CT without any evidence of distant  metastatic disease.  He is currently on concurrent chemo and radiation, status post 2 weekly cycles of cisplatin.  No adverse effects from chemotherapy. With regards to dexamethasone, he tried a half dose of it and he felt pretty well. With regards to weight loss, he appears to have some fluctuations in weight, he was 278 pounds yesterday and 273 today.  We will continue to monitor.  He reports great appetite and good caloric intake. He is very pleased so far with clinical response with tremendous improvement in his cervical lymphadenopathy, improvement in otalgia and tinnitus. Labs appear satisfactory to proceed with chemo as planned for tomorrow.  Mucositis, mild at this time, continue viscous lidocaine as needed.     HISTORY OF PRESENTING ILLNESS:  NICHLOS KUNZLER 76 y.o. male is here because of SCC oropharynx.  Oncologic History  This is a very pleasant 76 year old male patient who was seen by ENT with chief complaint of lump in his neck ongoing for the past 1 to 2 months.  Around July he suddenly noticed the lump in the neck.  He was initially treated by antibiotics but this has not changed.  He also has started noticing some difficulty swallowing and some dysgeusia.  He tells me that he has lost about 20 pounds of weight in the past 2 to 3 months but part of it could be intentional.  He was seen by Dr. Synetta Shadow on September 20 for an initial consultation and he had flexible fiberoptic endoscopy the same day which noticed large base of tongue tumor.  This tumor measured at least 2 to 3 cm in diameter extending across midline.  He also had a CT on September 27 which confirmed 4 cm mainly  exophytic mass centered at the left base of the tongue with at least 1 ipsilateral malignant node measuring nearly 4 cm with signs of extracapsular extension.  Findings consistent with metastatic squamous cell carcinoma.  PET/CT done on October 13 with no findings of metastatic disease, large left-sided  hypermetabolic oropharyngeal neoplasm and adjacent left-sided metastatic lymphadenopathy.  Interval history  Patient is here for follow-up after second cycle of cisplatin.  He is doing quite well.  His neck mass has shrunk significantly and he is very happy about it.  The referred pain to the ear has completely resolved.  Tinnitus has also completely resolved.  He is able to swallow well, has lost a few pounds although he tells me that he weighed 278 pounds on the radiation weight machine yesterday and he believes 1 of these could be wrong.  He still has great appetite although nothing tastes good and he is trying his best to eat.  He denies any adverse effects with chemotherapy. He has not been using the G-tube yet.  Rest of the pertinent 10 point ROS reviewed and negative.  REVIEW OF SYSTEMS:   Constitutional: Denies fevers, chills or abnormal night sweats Eyes: Denies blurriness of vision, double vision or watery eyes Ears, nose, mouth, throat, and face: Denies mucositis or sore throat Respiratory: Denies cough, dyspnea or wheezes Cardiovascular: Denies palpitation, chest discomfort or lower extremity swelling Gastrointestinal:  Denies nausea, heartburn or change in bowel habits Skin: Denies abnormal skin rashes Lymphatics: Denies new lymphadenopathy or easy bruising Neurological:Denies numbness, tingling or new weaknesses Behavioral/Psych: Mood is stable, no new changes  All other systems were reviewed with the patient and are negative.  MEDICAL HISTORY:  Past Medical History:  Diagnosis Date   Abdominal hernia    Midline   Adrenal incidentaloma ([place]) 2010   Aortic stenosis    Ascending aortic aneurysm ([place])    a. 4.4cm by imaging 03/2020.   CAD (coronary artery disease)    a. Cath 03/2018 - CTO of the prox-mid LAD with bridging, L-L and R-L collaterals, moderate, non-obstructive disease involving the proximal LAD, proximal LCx, and distal RCA, more severe disease noted involving  small branch vessels (RV marginal and superior branch of OM3).   Complete heart block (HCC)    Gallstones    Heart murmur    History of pacemaker    a. 11/2018 - [employer]tronic, intermittent CHB.   Hyperlipidemia    Morbid obesity (HCC)    Myocardial infarction [facility])    NSVT (nonsustained ventricular tachycardia) (HCC)    OSA (obstructive sleep apnea)    Severe PSG 1/07 AHI 66/hr, O2 Nadir 50% Refuses treatment - Pt does not believe test was accurate   Pericarditis    Pneumonia    Pulmonary HTN (HCC)    mild with PASP 54mHg by echo 02/2018, not seen on 03/2020 echo   Pulmonary nodule    Screening for AAA (abdominal aortic aneurysm) 2010   CT Abd/Pelvis   Ventral hernia    03/2009    SURGICAL HISTORY: Past Surgical History:  Procedure Laterality Date   CHOLECYSTECTOMY N/A 10/11/2019   Procedure: LAPAROSCOPIC CHOLECYSTECTOMY WITH INTRAOPERATIVE CHOLANGIOGRAM;  Surgeon: GArmandina Gemma MD;  Location: [facility];  Service: General;  Laterality: N/A;   CORONARY ANGIOGRAPHY N/A 03/23/2018   Procedure: CORONARY ANGIOGRAPHY;  Surgeon: ENelva Bush MD;  Location: [facility];  Service: Cardiovascular;  Laterality: N/A;   HERNIA REPAIR  05/2002   three   IR GASTROSTOMY TUBE MOD SED  03/08/2022   IR IMAGING GUIDED PORT INSERTION  03/08/2022   KNEE ARTHROSCOPY Left 09/2002   PACEMAKER IMPLANT N/A 11/08/2018   Procedure: PACEMAKER IMPLANT;  Surgeon: Constance Haw, MD;  Location: [facility];  Service: Cardiovascular;  Laterality: N/A;   SHOULDER SURGERY Right 2020   TOTAL KNEE ARTHROPLASTY Bilateral 09/2010   VENTRAL HERNIA REPAIR  03/2009    SOCIAL HISTORY: Social History   Socioeconomic History   Marital status: Married    Spouse name: Not on file   Number of children: Not on file   Years of education: Not on file   Highest education level: Not on file  Occupational History   Not on file  Tobacco Use   Smoking status: Former    Packs/day: 0.50    Types:  Cigarettes    Quit date: 11    Years since quitting: 30.8   Smokeless tobacco: Never  Vaping Use   Vaping Use: Never used  Substance and Sexual Activity   Alcohol use: No   Drug use: No   Sexual activity: Not Currently  Other Topics Concern   Not on file  Social History Narrative   Not on file   Social Determinants of Health   Financial Resource Strain: Low Risk  (02/24/2022)   Overall Financial Resource Strain (CARDIA)    Difficulty of Paying Living Expenses: Not hard at all  Food Insecurity: No Food Insecurity (02/24/2022)   Hunger Vital Sign    Worried About Running Out of Food in the Last Year: Never true    [place] in the Last Year: Never true  Transportation Needs: No Transportation Needs (02/24/2022)   PRAPARE - Hydrologist (Medical): No    Lack of Transportation (Non-Medical): No  Physical Activity: Not on file  Stress: Not on file  Social Connections: Not on file  Intimate Partner Violence: Not on file    FAMILY HISTORY: Family History  Problem Relation Age of Onset   Heart attack Father    Heart disease Father    Aneurysm Father    Breast cancer Sister    Colon cancer Sister     ALLERGIES:  is allergic to sertraline hcl.  MEDICATIONS:  Current Outpatient Medications  Medication Sig Dispense Refill   acetaminophen (TYLENOL) 500 MG tablet Take 1,000 mg by mouth every 6 (six) hours as needed (pain).     aspirin EC 81 MG tablet Take 81 mg by mouth daily.     buPROPion (WELLBUTRIN XL) 150 MG 24 hr tablet Take 150 mg by mouth every morning.     COVID-19 mRNA bivalent vaccine, Pfizer, (PFIZER COVID-19 VAC BIVALENT) injection Inject into the muscle. 0.3 mL 0   COVID-19 mRNA Vac-TriS, Pfizer, (PFIZER-BIONT COVID-19 VAC-TRIS) SUSP injection Inject into the muscle. 0.3 mL 0   COVID-19 mRNA vaccine 2023-2024 (COMIRNATY) syringe Inject into the muscle. 0.3 mL 0   dexamethasone (DECADRON) 4 MG tablet Take 2 tablets daily x 3  days starting the day after cisplatin chemotherapy. Take with food. 30 tablet 1   influenza vaccine adjuvanted (FLUAD) 0.5 ML injection Inject into the muscle. 0.5 mL 0   isosorbide mononitrate (IMDUR) 30 MG 24 hr tablet TAKE 1 TABLET ONCE DAILY. 90 tablet 0   levofloxacin (LEVAQUIN) 500 MG tablet Take 500 mg by mouth daily.     lidocaine (XYLOCAINE) 2 % solution Patient: Mix 1part 2% viscous lidocaine, 1part H20. Swish & swallow 14m of diluted mixture,  43mn before meals and at bedtime, up to QID 200 mL 3   lidocaine-prilocaine (EMLA) cream Apply to affected area once 30 g 3   metoprolol succinate (TOPROL-XL) 25 MG 24 hr tablet Take 0.5 tablets (12.5 mg total) by mouth daily. 90 tablet 3   Multiple Vitamin (MULTIVITAMIN) tablet Take 1 tablet by mouth daily.     nitroGLYCERIN (NITROSTAT) 0.4 MG SL tablet Place 1 tablet (0.4 mg total) under the tongue every 5 (five) minutes as needed for chest pain. 25 tablet 3   ondansetron (ZOFRAN) 8 MG tablet Take 1 tablet (8 mg total) by mouth every 8 (eight) hours as needed for nausea or vomiting. Start on the third day after cisplatin. 30 tablet 1   prochlorperazine (COMPAZINE) 10 MG tablet Take 1 tablet (10 mg total) by mouth every 6 (six) hours as needed (Nausea or vomiting). 30 tablet 1   rosuvastatin (CRESTOR) 10 MG tablet TAKE 1 TABLET ONCE DAILY. 90 tablet 0   RSV vaccine recomb adjuvanted (AREXVY) 120 MCG/0.5ML injection Inject into the muscle. 0.5 mL 0   Sulfamethoxazole-Trimethoprim (SULFAMETHOXAZOLE-TMP DS PO) Take by mouth.     tamsulosin (FLOMAX) 0.4 MG CAPS capsule Take 0.4 mg by mouth daily.     temazepam (RESTORIL) 15 MG capsule Take 1 capsule (15 mg total) by mouth at bedtime as needed for sleep. 30 capsule 0   No current facility-administered medications for this visit.     PHYSICAL EXAMINATION: ECOG PERFORMANCE STATUS: 0 - Asymptomatic  Vitals:   03/22/22 1556  BP: 127/77  Pulse: 71  Resp: 16  Temp: 97.9 F (36.6 C)  SpO2: 97%    Filed Weights   03/22/22 1556  Weight: 273 lb 3.2 oz (123.9 kg)    Physical Exam Constitutional:      Appearance: Normal appearance.  HENT:     Mouth/Throat:     Mouth: Mucous membranes are moist.     Pharynx: Posterior oropharyngeal erythema (Mucositis noted) present.  Cardiovascular:     Rate and Rhythm: Normal rate and regular rhythm.     Pulses: Normal pulses.     Heart sounds: Normal heart sounds.  Musculoskeletal:        General: Normal range of motion.     Cervical back: Normal range of motion and neck supple. No rigidity.  Lymphadenopathy:     Cervical: Cervical adenopathy (Cervical lymphadenopathy appears to have significantly improved compared to last visit) present.  Skin:    General: Skin is warm and dry.  Neurological:     Mental Status: He is alert.    LABORATORY DATA:  I have reviewed the data as listed Lab Results  Component Value Date   WBC 10.3 03/22/2022   HGB 13.3 03/22/2022   HCT 39.2 03/22/2022   MCV 93.6 03/22/2022   PLT 174 03/22/2022     Chemistry      Component Value Date/Time   NA 136 03/22/2022 1454   NA 140 10/13/2021 0954   K 4.7 03/22/2022 1454   CL 101 03/22/2022 1454   CO2 31 03/22/2022 1454   BUN 24 (H) 03/22/2022 1454   BUN 20 10/13/2021 0954   CREATININE 0.89 03/22/2022 1454      Component Value Date/Time   CALCIUM 9.0 03/22/2022 1454   ALKPHOS 73 03/30/2021 1119   AST 20 03/30/2021 1119   ALT 18 03/30/2021 1119   BILITOT 0.4 03/30/2021 1119       RADIOGRAPHIC STUDIES: I have personally reviewed the radiological images as  listed and agreed with the findings in the report. IR Gastrostomy Tube  Result Date: 03/08/2022 INDICATION: Head and neck cancer. EXAM: PERCUTANEOUS GASTROSTOMY TUBE PLACEMENT COMPARISON:  PET-CT, 02/18/2022. MEDICATIONS: Ancef 3 gm IV; Antibiotics were administered within 1 hour of the procedure. 0.5 mg glucagon IV was administered. CONTRAST:  20 mL mL of Isovue 300 administered into the gastric  lumen. ANESTHESIA/SEDATION: Moderate (conscious) sedation was employed during this procedure. A total of Versed 3 mg and Fentanyl 150 mcg was administered intravenously. Moderate Sedation Time: 37 minutes. The patient's level of consciousness and vital signs were monitored continuously by radiology nursing throughout the procedure under my direct supervision. FLUOROSCOPY TIME:  Fluoroscopic dose; 4 mGy COMPLICATIONS: None immediate. PROCEDURE: Informed written consent was obtained from the patient and/or patient's representative following explanation of the procedure, risks, benefits and alternatives. A time out was performed prior to the initiation of the procedure. Maximal barrier sterile technique utilized including caps, mask, sterile gowns, sterile gloves, large sterile drape, hand hygiene and Betadine prep. The LEFT upper quadrant was sterilely prepped and draped. A oral gastric catheter was inserted into the stomach under fluoroscopy. The existing nasogastric feeding tube was removed. The left costal margin and barium opacified transverse colon were identified and avoided. Air was injected into the stomach for insufflation and visualization under fluoroscopy. Under sterile conditions and local anesthesia, 2 T tacks were utilized to pexy the anterior aspect of the stomach against the ventral abdominal wall. Contrast injection confirmed appropriate positioning of each of the T tacks. An incision was made between the T tacks and a 17 gauge trocar needle was utilized to access the stomach. Needle position was confirmed within the stomach with aspiration of air and injection of a small amount of contrast. A stiff Glidewire was advanced into the gastric lumen and under intermittent fluoroscopic guidance, then was exchanged for a telescoping peel-away sheath, ultimately allowing placement of a 18 Fr balloon retention gastrostomy tube. The retention balloon was insufflated with a mixture of dilute saline and contrast  and pulled taut against the anterior wall of the stomach. The external disc was cinched. Contrast injection confirms positioning within the stomach. Several spot radiographic images were obtained in various obliquities for documentation. The patient tolerated procedure well without immediate post procedural complication. FINDINGS: After successful fluoroscopic guided placement, the gastrostomy tube is appropriately positioned with internal retention balloon against the ventral aspect of the gastric lumen. IMPRESSION: Successful placement of an 18 Fr balloon retention percutaneous gastrostomy tube, as above. PLAN: The gastrostomy may be used immediately for medication administration and in 24 hrs for the initiation of feeds. The patient will return to Vascular Interventional Radiology (VIR) for routine gastrostomy exchange in 6 months. Michaelle Birks, MD [facility] Radiology Electronically Signed   By: Michaelle Birks M.D.   On: 03/08/2022 19:36   IR IMAGING GUIDED PORT INSERTION  Result Date: 03/08/2022 INDICATION: Head and neck cancer EXAM: IMPLANTED PORT A CATH PLACEMENT WITH ULTRASOUND AND FLUOROSCOPIC GUIDANCE MEDICATIONS: None ANESTHESIA/SEDATION: Procedure performed concurrently with gastrostomy placement. For combined sedation details, including medication doses and time, please see concurrent percutaneous gastrostomy report. FLUOROSCOPY TIME:  Fluoroscopic dose; 0 mGy COMPLICATIONS: None immediate. PROCEDURE: The procedure, risks, benefits, and alternatives were explained to the patient. Questions regarding the procedure were encouraged and answered. The patient understands and consents to the procedure. The RIGHT neck and chest were prepped with chlorhexidine in a sterile fashion, and a sterile drape was applied covering the operative field.  Maximum barrier sterile technique with sterile gowns and gloves were used for the procedure. A timeout was performed  prior to the initiation of the procedure. Local anesthesia was provided with 1% lidocaine with epinephrine. After creating a small venotomy incision, a micropuncture kit was utilized to access the internal jugular vein under direct, real-time ultrasound guidance. Ultrasound image documentation was performed. The microwire was kinked to measure appropriate catheter length. A subcutaneous port pocket was then created along the upper chest wall utilizing a combination of sharp and blunt dissection. The pocket was irrigated with sterile saline. A single lumen ISP power injectable port was chosen for placement. The 8 Fr catheter was tunneled from the port pocket site to the venotomy incision. The port was placed in the pocket. The external catheter was trimmed to appropriate length. At the venotomy, an 8 Fr peel-away sheath was placed over a guidewire under fluoroscopic guidance. The catheter was then placed through the sheath and the sheath was removed. Final catheter positioning was confirmed and documented with a fluoroscopic spot radiograph. The port was accessed with a Huber needle, aspirated and flushed with heparinized saline. The port pocket incision was closed with interrupted 3-0 Vicryl suture then Dermabond was applied, including at the venotomy incision. Dressings were placed. The patient tolerated the procedure well without immediate post procedural complication. IMPRESSION: Successful placement of a RIGHT internal jugular approach power injectable Port-A-Cath. The tip of the catheter is positioned within the proximal RIGHT atrium. The catheter is ready for immediate use. Michaelle Birks, MD [facility] Radiology Electronically Signed   By: Michaelle Birks M.D.   On: 03/08/2022 19:30   CUP PACEART INCLINIC DEVICE CHECK  Result Date: 02/22/2022 Pacemaker check in clinic. Normal device function. Thresholds, sensing, impedances consistent with previous  measurements. Device programmed to maximize longevity. Afib burden <0.1%.  Few brief high ventricular rates noted-see attachment. Device programmed at appropriate safety margins. Histogram distribution appropriate for patient activity level. Device programmed to optimize intrinsic conduction. Estimated longevity 11.3 years. Patient enrolled in remote follow-up. Patient education completed.Myrtie Hawk, BSN, RN   All questions were answered. The patient knows to call the clinic with any problems, questions or concerns. I spent 30 minutes in the care of this patient including H and P, review of records, counseling and coordination of care.     Benay Pike, MD 03/22/2022 4:33 PM

## 2022-03-23 ENCOUNTER — Inpatient Hospital Stay: Payer: PPO | Admitting: Dietician

## 2022-03-23 ENCOUNTER — Ambulatory Visit
Admission: RE | Admit: 2022-03-23 | Discharge: 2022-03-23 | Disposition: A | Discharge status: Home / Self Care | Payer: PPO | Source: Ambulatory Visit | Attending: Radiation Oncology | Admitting: Radiation Oncology

## 2022-03-23 ENCOUNTER — Inpatient Hospital Stay: Payer: PPO

## 2022-03-23 ENCOUNTER — Other Ambulatory Visit: Payer: Self-pay

## 2022-03-23 VITALS — BP 128/72 | HR 70 | Temp 98.2°F | Resp 18

## 2022-03-23 DIAGNOSIS — C01 Malignant neoplasm of base of tongue: Secondary | ICD-10-CM

## 2022-03-23 DIAGNOSIS — Z5111 Encounter for antineoplastic chemotherapy: Secondary | ICD-10-CM | POA: Diagnosis not present

## 2022-03-23 DIAGNOSIS — Z510 Encounter for antineoplastic radiation therapy: Secondary | ICD-10-CM | POA: Diagnosis not present

## 2022-03-23 DIAGNOSIS — C109 Malignant neoplasm of oropharynx, unspecified: Secondary | ICD-10-CM | POA: Diagnosis not present

## 2022-03-23 LAB — RAD ONC ARIA SESSION SUMMARY
Course Elapsed Days: 14
Plan Fractions Treated to Date: 11
Plan Prescribed Dose Per Fraction: 2 Gy
Plan Total Fractions Prescribed: 35
Plan Total Prescribed Dose: 70 Gy
Reference Point Dosage Given to Date: 22 Gy
Reference Point Session Dosage Given: 2 Gy
Session Number: 11

## 2022-03-23 MED ORDER — PALONOSETRON HCL INJECTION 0.25 MG/5ML
0.2500 mg | Freq: Once | INTRAVENOUS | Status: AC
  Administered 2022-03-23: 0.25 mg via INTRAVENOUS
  Filled 2022-03-23: qty 5

## 2022-03-23 MED ORDER — MAGNESIUM SULFATE 2 GM/50ML IV SOLN
2.0000 g | Freq: Once | INTRAVENOUS | Status: AC
  Administered 2022-03-23: 2 g via INTRAVENOUS
  Filled 2022-03-23: qty 50

## 2022-03-23 MED ORDER — SODIUM CHLORIDE 0.9 % IV SOLN
150.0000 mg | Freq: Once | INTRAVENOUS | Status: AC
  Administered 2022-03-23: 150 mg via INTRAVENOUS
  Filled 2022-03-23: qty 150

## 2022-03-23 MED ORDER — SODIUM CHLORIDE 0.9 % IV SOLN: Freq: Once | INTRAVENOUS | Status: AC

## 2022-03-23 MED ORDER — POTASSIUM CHLORIDE IN NACL 20-0.9 MEQ/L-% IV SOLN
Freq: Once | INTRAVENOUS | Status: AC
  Filled 2022-03-23: qty 1000

## 2022-03-23 MED ORDER — SODIUM CHLORIDE 0.9 % IV SOLN
39.5000 mg/m2 | Freq: Once | INTRAVENOUS | Status: AC
  Administered 2022-03-23: 100 mg via INTRAVENOUS
  Filled 2022-03-23: qty 100

## 2022-03-23 MED ORDER — SODIUM CHLORIDE 0.9 % IV SOLN
10.0000 mg | Freq: Once | INTRAVENOUS | Status: AC
  Administered 2022-03-23: 10 mg via INTRAVENOUS
  Filled 2022-03-23: qty 10

## 2022-03-23 NOTE — Progress Notes (Signed)
Nutrition Follow-up:  Patient with newly diagnosed stage II SCC of oropharynx. He is currently concurrent chemoradiation with weekly cisplatin (first treatment 11/1). S/p G-tube placement 10/31.     Met with patient in infusion. Patient says he "feels wonderful." He has a "decent" appetite and is eating/drinking without difficulty. Patient recalls spaghetti with garlic bread for dinner last night. He is drinking a couple of Boost and has started drinking 2 large glasses of whole milk. Patient reports some foods taste terrible including ice cream. Saliva is becoming "ropey." He is doing baking soda salt water rinses several times/day. Patient denies nausea, vomiting, diarrhea, constipation. He is taking miralax daily. Patient remains active, recalls 50 minutes on the treadmill yesterday at the gym. Patient is flushing tube with water. He does not wish to supplement oral intake with tube feedings.    Medications: reviewed   Labs: BUN 24  Anthropometrics: Wt continues to trend down. Pt 273 lb 3.2 oz on 11/14 decreased from 276 lb 4.8 oz on 11/7   11/1 - 284 lb 4 oz  3.9% (-11 lb) in 2 weeks - severe  Estimated Energy Needs  Kcals: 2725-3075 Protein: 147-160 Fluid: >2.7  NUTRITION DIAGNOSIS: Predicted suboptimal intake continues   INTERVENTION:  Continued education on importance of weight maintenance to preserve lean body mass Continue drinking 2 Boost and 2 glasses whole milk - pt agreeable to additional glass of whole milk at bedtime Continue baking soda salt water rinses, suggested trying before meals to improve altered taste Suggested sprite/ginger ale rinse for thick saliva Continue bowel regimen Continue daily water flush - pt declines to supplementing oral intake with tube feeding at this time    MONITORING, EVALUATION, GOAL: weight trends, intake   NEXT VISIT: Tuesday November 15 before radiation

## 2022-03-24 ENCOUNTER — Ambulatory Visit: Payer: PPO | Attending: Neurology

## 2022-03-24 ENCOUNTER — Other Ambulatory Visit: Payer: Self-pay

## 2022-03-24 ENCOUNTER — Ambulatory Visit
Admission: RE | Admit: 2022-03-24 | Discharge: 2022-03-24 | Disposition: A | Discharge status: Home / Self Care | Payer: PPO | Source: Ambulatory Visit | Attending: Radiation Oncology | Admitting: Radiation Oncology

## 2022-03-24 DIAGNOSIS — C109 Malignant neoplasm of oropharynx, unspecified: Secondary | ICD-10-CM | POA: Diagnosis not present

## 2022-03-24 DIAGNOSIS — R1310 Dysphagia, unspecified: Secondary | ICD-10-CM | POA: Insufficient documentation

## 2022-03-24 DIAGNOSIS — Z510 Encounter for antineoplastic radiation therapy: Secondary | ICD-10-CM | POA: Diagnosis not present

## 2022-03-24 DIAGNOSIS — C01 Malignant neoplasm of base of tongue: Secondary | ICD-10-CM | POA: Diagnosis not present

## 2022-03-24 DIAGNOSIS — Z5111 Encounter for antineoplastic chemotherapy: Secondary | ICD-10-CM | POA: Diagnosis not present

## 2022-03-24 LAB — RAD ONC ARIA SESSION SUMMARY
Course Elapsed Days: 15
Plan Fractions Treated to Date: 12
Plan Prescribed Dose Per Fraction: 2 Gy
Plan Total Fractions Prescribed: 35
Plan Total Prescribed Dose: 70 Gy
Reference Point Dosage Given to Date: 24 Gy
Reference Point Session Dosage Given: 2 Gy
Session Number: 12

## 2022-03-24 NOTE — Therapy (Signed)
OUTPATIENT SPEECH LANGUAGE PATHOLOGY TREATMENT   Patient Name: Clarence Andrade MRN: [id number] DOB:1945-09-10, 76 y.o., male Today's Date: 03/24/2022  PCP: Lajean Manes, MD REFERRING PROVIDER: [name], MD   End of Session - 03/24/22 1340     Visit Number 2    Number of Visits 7    Date for SLP Re-Evaluation 05/25/22    SLP Start Time 1339    SLP Stop Time  1410    SLP Time Calculation (min) 31 min    Activity Tolerance Patient tolerated treatment well             Past Medical History:  Diagnosis Date   Abdominal hernia    Midline   Adrenal incidentaloma ([place]) 2010   Aortic stenosis    Ascending aortic aneurysm ([place])    a. 4.4cm by imaging 03/2020.   CAD (coronary artery disease)    a. Cath 03/2018 - CTO of the prox-mid LAD with bridging, L-L and R-L collaterals, moderate, non-obstructive disease involving the proximal LAD, proximal LCx, and distal RCA, more severe disease noted involving small branch vessels (RV marginal and superior branch of OM3).   Complete heart block (HCC)    Gallstones    Heart murmur    History of pacemaker    a. 11/2018 - [employer], intermittent CHB.   Hyperlipidemia    Morbid obesity (HCC)    Myocardial infarction [facility])    NSVT (nonsustained ventricular tachycardia) (HCC)    OSA (obstructive sleep apnea)    Severe PSG 1/07 AHI 66/hr, O2 Nadir 50% Refuses treatment - Pt does not believe test was accurate   Pericarditis    Pneumonia    Pulmonary HTN (HCC)    mild with PASP 68mHg by echo 02/2018, not seen on 03/2020 echo   Pulmonary nodule    Screening for AAA (abdominal aortic aneurysm) 2010   CT Abd/Pelvis   Ventral hernia    03/2009   Past Surgical History:  Procedure Laterality Date   CHOLECYSTECTOMY N/A 10/11/2019   Procedure: LAPAROSCOPIC CHOLECYSTECTOMY WITH INTRAOPERATIVE CHOLANGIOGRAM;  Surgeon: GArmandina Gemma MD;  Location: [facility];  Service: General;  Laterality: N/A;   CORONARY ANGIOGRAPHY N/A 03/23/2018    Procedure: CORONARY ANGIOGRAPHY;  Surgeon: ENelva Bush MD;  [place] [facility];  Service: Cardiovascular;  Laterality: N/A;   HERNIA REPAIR  05/2002   three   IR GASTROSTOMY TUBE MOD SED  03/08/2022   IR IMAGING GUIDED PORT INSERTION  03/08/2022   KNEE ARTHROSCOPY Left 09/2002   PACEMAKER IMPLANT N/A 11/08/2018   Procedure: PACEMAKER IMPLANT;  Surgeon: CConstance Haw MD;  [place] [facility];  Service: Cardiovascular;  Laterality: N/A;   SHOULDER SURGERY Right 2020   TOTAL KNEE ARTHROPLASTY Bilateral 09/2010   VENTRAL HERNIA REPAIR  03/2009   Patient Active Problem List   Diagnosis Date Noted   Constipation 03/15/2022   Encounter for fitting or adjustment of dental prosthetic device 03/01/2022   Exostosis of jaw 02/22/2022   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   Teeth missing 02/22/2022   Accretions on teeth 02/22/2022   Chronic periodontitis 02/22/2022   Gingival recession, localized 02/22/2022   Excessive dental attrition 02/22/2022   Abfraction 02/22/2022   Malocclusion 02/22/2022   Incipient enamel caries 02/22/2022   Encounter for preoperative dental examination 02/21/2022   [place]align[place] 02/18[place]2022   Squamous [place] carcinoma of [place]arynx [place] 02/17/2022   Cholelithiasis with chronic cholecystitis 10/05/2019   Stable angina 03/23/2018  Abnormal stress test 03/23/2018   Aortic stenosis    Pulmonary HTN (HCC)    Exertional chest pain 03/07/2018   Coronary artery calcification seen on CAT scan 07/03/2013   Heart murmur 07/03/2013   Pericarditis 06/12/2013    ONSET DATE: See "pertinent history" below   REFERRING DIAG: SCC of his Oropharynx, stage 1   THERAPY DIAG:  Dysphagia, unspecified type  Rationale for Evaluation and Treatment Rehabilitation  SUBJECTIVE:   SUBJECTIVE STATEMENT: "Drinking orange drink, whole milk, and water. Spaghetti, salmon, eggs, bacon, toast, cereal.   Pt accompanied by: self and  significant other  PERTINENT HISTORY:  He presented to Dr. Sabino Gasser [facility]) on 01/26/22 with complaints of a neck lump present for 1-2 months. During this consultation he reported a tight feeling in his throat and difficulty swallowing solids, liquids, pills. 01/26/22 Laryngoscopy with Dr. Sabino Gasser revealed an exophytic friable tumor at least 2-3 cm in diameter involving the base on the tongue, extending across the midline. Exam also showed a left level 2 lymph node fixed to underlying soft tissues. Overall, findings were notably suspicious for aggressive oropharyngeal squamous cell carcinoma. 02/02/22 CT neck showed a 4 cm mainly exophytic mass centered at the left base of the tongue, with at least 1 ipsilateral malignant node measuring nearly 4 cm with signs of extracapsular extension. Findings were overall consistent with metastatic squamous cell carcinoma. 02/10/22 Biopsy of enlarged left neck node revealed: moderately differentiated squamous cell carcinoma; p16 positive. Diagnostic comment also noted the lymph node as presumably entirely replaced by carcinoma. 02/17/22 Consult with Dr. Chryl Heck and 02/18/22 Consult with Dr. Isidore Moos. 02/18/22 PET showed no evidence of metastatic disease. Treatment plan:  He will receive 35 fractions of radiation to his Oropharynx and bilateral neck with weekly chemotherapy. CT simulation scheduled for 03/01/22. He will start the week of 03/07/22. PAC/PEG scheduled for 03/08/22.   PAIN:  Are you having pain? No  PATIENT GOALS: Maintain swallow function  OBJECTIVE:   TODAY'S TREATMENT: 03/24/22: Pt has been eating diet as desired. Pt is not completing HEP as prescribed (pt is performing less reps and reduced scope after "doing it religiously for the first couple weeks". He req'd total A for rationale of HEP. SLP told pt he needed 10-15 reps x2 to ensure WNL swallow is maintained. Pt completed HEP with modified independence. Pt ate ham sandwich and drank water without  overt s/sx of oral or pharyngeal difficulty. "Knock on wood, Glendell Docker, I'm doing very well."  Next session SLP will consider pt decr ST frequency to once every 8 weeks if he can maintain success with HEP procedure, and there are no concerns about safety with POs.  02/24/22: Research states the risk for dysphagia increases due to radiation and/or chemotherapy treatment due to a variety of factors, so SLP educated the pt about the possibility of reduced/limited ability for PO intake during rad tx. SLP also educated pt regarding possible changes to swallowing musculature after rad tx, and why adherence to dysphagia HEP provided today and PO consumption was necessary to inhibit muscle fibrosis following rad tx and to mitigate muscle disuse atrophy. SLP informed pt why this would be detrimental to their swallowing status and to their pulmonary health. Pt demonstrated understanding of these things to SLP. SLP encouraged pt to safely eat and drink as deep into their radiation/chemotherapy as possible to provide the best possible long-term swallowing outcome for pt.  SLP then developed an individualized HEP for pt involving oral and pharyngeal and vocal  strengthening  and ROM and pt was instructed how to perform these exercises, including SLP demonstration. After SLP demonstration, pt return demonstrated each exercise. SLP ensured pt performance was correct prior to educating pt on next exercise. Pt required min cues faded to modified independent to perform HEP. Pt was instructed to complete this program 6-7 days/week, at least 2 times a day until 6 months after his or her last day of rad tx, and then x2 a week after that, indefinitely. Modifications for days when pt cannot perform HEP as prescribed include cycle through the full program of exercises instead of fatiguing on one of the swallowing exercises and being unable to perform the others, and take pain meds prior to performing HEP. SLP instructed that swallowing  exercise reps should be increased back as pt is able to do so. Secondly, pt was told that former patients have told SLP that during their course of radiation therapy, taking prescribed pain medication just prior to performing HEP (and eating/drinking) has proven helpful in completing HEP (and eating and drinking) more regularly when going through their course of radiation treatment.   PATIENT EDUCATION: Education details: late effects head/neck radiation on swallow function, HEP procedure, and modification to HEP when difficulty experienced with swallowing during and after radiation course Person educated: Patient Education method: Consulting civil engineer, Demonstration, Verbal cues, and Handouts Education comprehension: verbalized understanding, returned demonstration, verbal cues required, and needs further education   ASSESSMENT:  CLINICAL IMPRESSION: Patient is a 76 y.o. male who was seen today for treatment of swallowing as they undergo chemoradiation therapy. Today pt ate ham sandwich and drank thin liquids without overt s/s oral or pharyngeal difficulty. At this time pt swallowing is deemed WNL/WFL with these POs. No oral or overt s/sx pharyngeal deficits, including aspiration were observed. There are no overt s/s aspiration PNA observed by SLP nor any reported by pt at this time. Data indicate that pt's swallow ability will likely decrease over the course of chemoradiation therapy and could very well decline over time following the conclusion of that therapy due to muscle disuse atrophy and/or muscle fibrosis. Pt will cont to need to be seen by SLP in order to assess safety of PO intake, assess the need for recommending any objective swallow assessment, and ensuring pt is correctly completing the individualized HEP.  OBJECTIVE IMPAIRMENTS: include dysphagia. These impairments are limiting patient from safety when swallowing. Factors affecting potential to achieve goals and functional outcome are none.  Patient will benefit from skilled SLP services to address above impairments and improve overall function.  REHAB POTENTIAL: Good   GOALS: Goals reviewed with patient? No   SHORT TERM GOALS: Target date:  2 therapy visits (visit #3)       pt will complete HEP with rare min A  Baseline:03-24-22 Goal status: Ongoing   2.  pt will tell SLP why pt is completing HEP with modified independence Baseline:  Goal status: Ongoing   3.  pt will describe 3 overt s/s aspiration PNA with modified independence Baseline:  Goal status: Ongoing     LONG TERM GOALS: Target date:  6 therapy visits (visit #7)      pt will complete HEP with modified independnence in 2 sessions Baseline:  Goal status: Ongoing   2.  pt will describe how to modify HEP over time, and the timeline associated with reduction in HEP frequency with modified independence over two sessions Baseline:  Goal status: Ongoing     PLAN: SLP FREQUENCY:  once every approx  4 weeks   SLP DURATION:  7 total sessions   PLANNED INTERVENTIONS: Aspiration precaution training, Pharyngeal strengthening exercises, Diet toleration management , Trials of upgraded texture/liquids, Internal/external aids, SLP instruction and feedback, Compensatory strategies, and Patient/family education    [facility], [place] 03/24/2022, 1:40 PM

## 2022-03-25 ENCOUNTER — Ambulatory Visit
Admission: RE | Admit: 2022-03-25 | Discharge: 2022-03-25 | Disposition: A | Discharge status: Home / Self Care | Payer: PPO | Source: Ambulatory Visit | Attending: Radiation Oncology | Admitting: Radiation Oncology

## 2022-03-25 ENCOUNTER — Other Ambulatory Visit: Payer: Self-pay

## 2022-03-25 DIAGNOSIS — Z5111 Encounter for antineoplastic chemotherapy: Secondary | ICD-10-CM | POA: Diagnosis not present

## 2022-03-25 DIAGNOSIS — Z510 Encounter for antineoplastic radiation therapy: Secondary | ICD-10-CM | POA: Diagnosis not present

## 2022-03-25 DIAGNOSIS — C109 Malignant neoplasm of oropharynx, unspecified: Secondary | ICD-10-CM | POA: Diagnosis not present

## 2022-03-25 DIAGNOSIS — C01 Malignant neoplasm of base of tongue: Secondary | ICD-10-CM | POA: Diagnosis not present

## 2022-03-25 LAB — RAD ONC ARIA SESSION SUMMARY
Course Elapsed Days: 16
Plan Fractions Treated to Date: 13
Plan Prescribed Dose Per Fraction: 2 Gy
Plan Total Fractions Prescribed: 35
Plan Total Prescribed Dose: 70 Gy
Reference Point Dosage Given to Date: 26 Gy
Reference Point Session Dosage Given: 2 Gy
Session Number: 13

## 2022-03-27 ENCOUNTER — Other Ambulatory Visit: Payer: Self-pay

## 2022-03-27 ENCOUNTER — Ambulatory Visit
Admission: RE | Admit: 2022-03-27 | Discharge: 2022-03-27 | Disposition: A | Discharge status: Home / Self Care | Payer: PPO | Source: Ambulatory Visit | Attending: Radiation Oncology | Admitting: Radiation Oncology

## 2022-03-27 DIAGNOSIS — C01 Malignant neoplasm of base of tongue: Secondary | ICD-10-CM | POA: Diagnosis not present

## 2022-03-27 DIAGNOSIS — C109 Malignant neoplasm of oropharynx, unspecified: Secondary | ICD-10-CM | POA: Diagnosis not present

## 2022-03-27 DIAGNOSIS — Z510 Encounter for antineoplastic radiation therapy: Secondary | ICD-10-CM | POA: Diagnosis not present

## 2022-03-27 DIAGNOSIS — Z5111 Encounter for antineoplastic chemotherapy: Secondary | ICD-10-CM | POA: Diagnosis not present

## 2022-03-27 LAB — RAD ONC ARIA SESSION SUMMARY
Course Elapsed Days: 18
Plan Fractions Treated to Date: 14
Plan Prescribed Dose Per Fraction: 2 Gy
Plan Total Fractions Prescribed: 35
Plan Total Prescribed Dose: 70 Gy
Reference Point Dosage Given to Date: 28 Gy
Reference Point Session Dosage Given: 2 Gy
Session Number: 14

## 2022-03-28 ENCOUNTER — Other Ambulatory Visit: Payer: Self-pay

## 2022-03-28 ENCOUNTER — Ambulatory Visit: Payer: PPO

## 2022-03-28 ENCOUNTER — Ambulatory Visit
Admission: RE | Admit: 2022-03-28 | Discharge: 2022-03-28 | Disposition: A | Discharge status: Home / Self Care | Payer: PPO | Source: Ambulatory Visit | Attending: Radiation Oncology | Admitting: Radiation Oncology

## 2022-03-28 DIAGNOSIS — C109 Malignant neoplasm of oropharynx, unspecified: Secondary | ICD-10-CM | POA: Diagnosis not present

## 2022-03-28 DIAGNOSIS — Z5111 Encounter for antineoplastic chemotherapy: Secondary | ICD-10-CM | POA: Diagnosis not present

## 2022-03-28 DIAGNOSIS — C01 Malignant neoplasm of base of tongue: Secondary | ICD-10-CM | POA: Diagnosis not present

## 2022-03-28 DIAGNOSIS — Z510 Encounter for antineoplastic radiation therapy: Secondary | ICD-10-CM | POA: Diagnosis not present

## 2022-03-28 LAB — RAD ONC ARIA SESSION SUMMARY
Course Elapsed Days: 19
Plan Fractions Treated to Date: 15
Plan Prescribed Dose Per Fraction: 2 Gy
Plan Total Fractions Prescribed: 35
Plan Total Prescribed Dose: 70 Gy
Reference Point Dosage Given to Date: 30 Gy
Reference Point Session Dosage Given: 2 Gy
Session Number: 15

## 2022-03-29 ENCOUNTER — Inpatient Hospital Stay: Payer: PPO | Admitting: Dietician

## 2022-03-29 ENCOUNTER — Other Ambulatory Visit: Payer: Self-pay

## 2022-03-29 ENCOUNTER — Ambulatory Visit
Admission: RE | Admit: 2022-03-29 | Discharge: 2022-03-29 | Disposition: A | Discharge status: Home / Self Care | Payer: PPO | Source: Ambulatory Visit | Attending: Radiation Oncology | Admitting: Radiation Oncology

## 2022-03-29 ENCOUNTER — Inpatient Hospital Stay (HOSPITAL_BASED_OUTPATIENT_CLINIC_OR_DEPARTMENT_OTHER): Payer: PPO | Admitting: Adult Health

## 2022-03-29 ENCOUNTER — Inpatient Hospital Stay: Payer: PPO

## 2022-03-29 VITALS — BP 98/68 | HR 87 | Temp 97.9°F | Resp 16 | Ht 72.0 in | Wt 269.6 lb

## 2022-03-29 DIAGNOSIS — C01 Malignant neoplasm of base of tongue: Secondary | ICD-10-CM

## 2022-03-29 DIAGNOSIS — C109 Malignant neoplasm of oropharynx, unspecified: Secondary | ICD-10-CM | POA: Diagnosis not present

## 2022-03-29 DIAGNOSIS — N400 Enlarged prostate without lower urinary tract symptoms: Secondary | ICD-10-CM | POA: Insufficient documentation

## 2022-03-29 DIAGNOSIS — Z510 Encounter for antineoplastic radiation therapy: Secondary | ICD-10-CM | POA: Diagnosis not present

## 2022-03-29 DIAGNOSIS — Z95828 Presence of other vascular implants and grafts: Secondary | ICD-10-CM

## 2022-03-29 DIAGNOSIS — Z5111 Encounter for antineoplastic chemotherapy: Secondary | ICD-10-CM | POA: Diagnosis not present

## 2022-03-29 LAB — CBC WITH DIFFERENTIAL (CANCER CENTER ONLY)
Abs Immature Granulocytes: 0.05 K/uL (ref 0.00–0.07)
Basophils Absolute: 0 K/uL (ref 0.0–0.1)
Basophils Relative: 0 %
Eosinophils Absolute: 0.1 K/uL (ref 0.0–0.5)
Eosinophils Relative: 1 %
HCT: 34.5 % — ABNORMAL LOW (ref 39.0–52.0)
Hemoglobin: 11.8 g/dL — ABNORMAL LOW (ref 13.0–17.0)
Immature Granulocytes: 0 %
Lymphocytes Relative: 5 %
Lymphs Abs: 0.6 K/uL — ABNORMAL LOW (ref 0.7–4.0)
MCH: 32 pg (ref 26.0–34.0)
MCHC: 34.2 g/dL (ref 30.0–36.0)
MCV: 93.5 fL (ref 80.0–100.0)
Monocytes Absolute: 0.6 K/uL (ref 0.1–1.0)
Monocytes Relative: 6 %
Neutro Abs: 10 K/uL — ABNORMAL HIGH (ref 1.7–7.7)
Neutrophils Relative %: 88 %
Platelet Count: 123 K/uL — ABNORMAL LOW (ref 150–400)
RBC: 3.69 MIL/uL — ABNORMAL LOW (ref 4.22–5.81)
RDW: 12.9 % (ref 11.5–15.5)
WBC Count: 11.3 K/uL — ABNORMAL HIGH (ref 4.0–10.5)
nRBC: 0 % (ref 0.0–0.2)

## 2022-03-29 LAB — BASIC METABOLIC PANEL - CANCER CENTER ONLY
Anion gap: 3 — ABNORMAL LOW (ref 5–15)
BUN: 27 mg/dL — ABNORMAL HIGH (ref 8–23)
CO2: 30 mmol/L (ref 22–32)
Calcium: 9 mg/dL (ref 8.9–10.3)
Chloride: 100 mmol/L (ref 98–111)
Creatinine: 0.77 mg/dL (ref 0.61–1.24)
GFR, Estimated: >60 mL/min (ref >60)
Glucose, Bld: 110 mg/dL — ABNORMAL HIGH (ref 70–99)
Potassium: 4 mmol/L (ref 3.5–5.1)
Sodium: 133 mmol/L — ABNORMAL LOW (ref 135–145)

## 2022-03-29 LAB — MAGNESIUM: Magnesium: 1.7 mg/dL (ref 1.7–2.4)

## 2022-03-29 LAB — RAD ONC ARIA SESSION SUMMARY
Course Elapsed Days: 20
Plan Fractions Treated to Date: 16
Plan Prescribed Dose Per Fraction: 2 Gy
Plan Total Fractions Prescribed: 35
Plan Total Prescribed Dose: 70 Gy
Reference Point Dosage Given to Date: 32 Gy
Reference Point Session Dosage Given: 2 Gy
Session Number: 16

## 2022-03-29 MED ORDER — SODIUM CHLORIDE 0.9% FLUSH
10.0000 mL | INTRAVENOUS | Status: AC | PRN
  Administered 2022-03-29: 10 mL

## 2022-03-29 MED ORDER — HEPARIN SOD (PORK) LOCK FLUSH 100 UNIT/ML IV SOLN
500.0000 [IU] | INTRAVENOUS | Status: AC | PRN
  Administered 2022-03-29: 500 [IU]

## 2022-03-29 MED FILL — Fosaprepitant Dimeglumine For IV Infusion 150 MG (Base Eq): INTRAVENOUS | Qty: 5 | Status: AC

## 2022-03-29 MED FILL — Dexamethasone Sodium Phosphate Inj 100 MG/10ML: INTRAMUSCULAR | Qty: 1 | Status: AC

## 2022-03-29 NOTE — Progress Notes (Signed)
Nutrition Follow-up:  Patient with stage II SCC of oropharynx. He is currently concurrent chemoradiation with weekly cisplatin (first treatment 11/1). S/p G-tube placement 10/31.     Met with patient in waiting area as he was heading to radiation. Patient says that he has a good appetite and eating as normal despite foods tasting bad. He was liking the taste of milk last week, but it no longer taste good. Patient continues to drink a couple glasses. He has increased intake of Ensure endorses drinking 2 daily. He has had one Ensure and bowl of cereal today. Patient plans to eat sheppard pie for supper. Patient is flushing tube in the mornings. He is not using tube for feeding. Patient denies nausea, vomiting, diarrhea, constipation. States his biggest problem is fatigue. He is pleased with his weight loss despite ongoing effort/education for weight maintenance.   Medications: reviewed   Labs: Na 133, BUN 27, glucose 110, Hgb 11.8  Anthropometrics: Wt 269 lb 9.6 oz today decreased   11/14 - 273 lb 3.2 oz  11/7 - 276 lb 4.8 oz  11/1 - 284 lb 4 oz   Estimated Energy Needs   Kcals: 2725-3075 Protein: 147-160 Fluid: >2.7   NUTRITION DIAGNOSIS: Predicted suboptimal intake has evolved into inadequate oral intake related to [facility] as evidenced by 5% (15 lb) wt loss in 3 weeks, which is severe for time frame   INTERVENTION:  Continued education on importance of weight maintenance to preserve lean body mass  Encouraged soft moist high calorie/high protein foods - pt plans to try deviled eggs Increase to 3 Ensure Plus/equivalent + continue 2 glasses whole milk Continue bowel regimen Continue daily water flushes - pt declines to use tube for supplemental nutrition  Continue baking soda salt water rinses several times daily   MONITORING, EVALUATION, GOAL: weight trends, intake   NEXT VISIT: Wednesday November 29 during infusion

## 2022-03-29 NOTE — Progress Notes (Signed)
[facility] Follow up:    Clarence Frames, MD [address], [address] [place] [address]   DIAGNOSIS:  Cancer Staging  Malignant neoplasm of base of tongue ([place]) Staging form: Pharynx - HPV-Mediated Oropharynx, AJCC 8th Edition - Clinical stage from 02/18/2022: Stage II (cT3, cN1, cM0, p16+) - Unsigned Stage prefix: Initial diagnosis  Squamous cell carcinoma of oropharynx (HCC) Staging form: Pharynx - HPV-Mediated Oropharynx, AJCC 8th Edition - Clinical: Stage I (cT2, cN1, cM0, p16+) - Signed by Benay Pike, MD on 02/17/2022 Stage prefix: Initial diagnosis   SUMMARY OF ONCOLOGIC HISTORY: Oncology History  Squamous cell carcinoma of oropharynx ([place])  02/17/2022 Initial Diagnosis   Squamous cell carcinoma of oropharynx ([place])   02/17/2022 Cancer Staging   Staging form: Pharynx - HPV-Mediated Oropharynx, AJCC 8th Edition - Clinical: Stage I (cT2, cN1, cM0, p16+) - Signed by Benay Pike, MD on 02/17/2022 Stage prefix: Initial diagnosis   Malignant neoplasm of base of tongue ([place])  02/18/2022 Initial Diagnosis   Malignant neoplasm of base of tongue ([place])   03/09/2022 -  Chemotherapy   Patient is on Treatment Plan : HEAD/NECK Cisplatin (40) q7d       CURRENT THERAPY: weekly chemoradiation  INTERVAL HISTORY: Clarence Andrade 76 y.o. male returns for f/u prior to his Cisplatin.  His main concern is fatigue.  His appetite is decreased and his weight is down from 276 lbs two weeks ago to 269 pounds today.  He is eating and drinking and has a feeding tube in case he needs it.  He is flushing the tube daily.    Patient Active Problem List   Diagnosis Date Noted   Enlarged prostate 03/29/2022   Constipation 03/15/2022   Encounter for fitting or adjustment of dental prosthetic device 03/01/2022   Exostosis of jaw 02/22/2022   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   Teeth missing 02/22/2022   Accretions on teeth 02/22/2022   Chronic  periodontitis 02/22/2022   Gingival recession, localized 02/22/2022   Excessive dental attrition 02/22/2022   Abfraction 02/22/2022   Malocclusion 02/22/2022   Incipient enamel caries 02/22/2022   Encounter for preoperative dental examination 02/21/2022   Malignant neoplasm of base of tongue ([place]) 02/18/2022   Squamous cell carcinoma of oropharynx (HCC) 02/17/2022   Stable angina 03/23/2018   Abnormal stress test 03/23/2018   Aortic stenosis    Pulmonary HTN (HCC)    Exertional chest pain 03/07/2018   Tear of right rotator cuff 08/14/2017   Coronary artery calcification seen on CAT scan 07/03/2013   Heart murmur 07/03/2013   Pericarditis 06/12/2013    is allergic to sertraline hcl.  MEDICAL HISTORY: Past Medical History:  Diagnosis Date   Abdominal hernia    Midline   Adrenal incidentaloma ([place]) 2010   Aortic stenosis    Ascending aortic aneurysm ([place])    a. 4.4cm by imaging 03/2020.   CAD (coronary artery disease)    a. Cath 03/2018 - CTO of the prox-mid LAD with bridging, L-L and R-L collaterals, moderate, non-obstructive disease involving the proximal LAD, proximal LCx, and distal RCA, more severe disease noted involving small branch vessels (RV marginal and superior branch of OM3).   Complete heart block (HCC)    Gallstones    Heart murmur    History of pacemaker    a. 11/2018 - [employer]tronic, intermittent CHB.   Hyperlipidemia    Morbid obesity (HCC)    Myocardial infarction (HCC)    NSVT (nonsustained ventricular tachycardia) ([place])  OSA (obstructive sleep apnea)    Severe PSG 1/07 AHI 66/hr, O2 [place] 50% Refuses treatment - Pt does not believe test was accurate   Pericarditis    Pneumonia    Pulmonary HTN (HCC)    mild with PASP 68mHg by echo 02/2018, not seen on 03/2020 echo   Pulmonary nodule    Screening for AAA (abdominal aortic aneurysm) 2010   CT Abd/Pelvis   Ventral hernia    03/2009    SURGICAL HISTORY: Past Surgical History:  Procedure Laterality  Date   CHOLECYSTECTOMY N/A 10/11/2019   Procedure: LAPAROSCOPIC CHOLECYSTECTOMY WITH INTRAOPERATIVE CHOLANGIOGRAM;  Surgeon: GArmandina Gemma MD;  Location: [facility];  Service: General;  Laterality: N/A;   CORONARY ANGIOGRAPHY N/A 03/23/2018   Procedure: CORONARY ANGIOGRAPHY;  Surgeon: ENelva Bush MD;  Location[facility];  Service: Cardiovascular;  Laterality: N/A;   HERNIA REPAIR  05/2002   three   IR GASTROSTOMY TUBE MOD SED  03/08/2022   IR IMAGING GUIDED PORT INSERTION  03/08/2022   KNEE ARTHROSCOPY Left 09/2002   PACEMAKER IMPLANT N/A 11/08/2018   Procedure: PACEMAKER IMPLANT;  Surgeon: CConstance Haw MD;  Location: [facility];  Service: Cardiovascular;  Laterality: N/A;   SHOULDER SURGERY Right 2020   TOTAL KNEE ARTHROPLASTY Bilateral 09/2010   VENTRAL HERNIA REPAIR  03/2009    SOCIAL HISTORY: Social History   Socioeconomic History   Marital status: Married    Spouse name: Not on file   Number of children: Not on file   Years of education: Not on file   Highest education level: Not on file  Occupational History   Not on file  Tobacco Use   Smoking status: Former    Packs/day: 0.50    Types: Cigarettes    Quit date: 127   Years since quitting: 30.9   Smokeless tobacco: Never  Vaping Use   Vaping Use: Never used  Substance and Sexual Activity   Alcohol use: No   Drug use: No   Sexual activity: Not Currently  Other Topics Concern   Not on file  Social History Narrative   Not on file   Social Determinants of Health   Financial Resource Strain: Low Risk  (02/24/2022)   Overall Financial Resource Strain (CARDIA)    Difficulty of Paying Living Expenses: Not hard at all  Food Insecurity: No Food Insecurity (02/24/2022)   Hunger Vital Sign    Worried About Running Out of Food in the Last Year: Never true    [place] the Last Year: Never true  Transportation Needs: No Transportation Needs (02/24/2022)   PRAPARE - TArmed forces logistics/support/administrative officer(Medical): No    Lack of Transportation (Non-Medical): No  Physical Activity: Not on file  Stress: Not on file  Social Connections: Not on file  Intimate Partner Violence: Not on file    FAMILY HISTORY: Family History  Problem Relation Age of Onset   Heart attack Father    Heart disease Father    Aneurysm Father    Breast cancer Sister    Colon cancer Sister     Review of Systems  Constitutional:  Positive for fatigue. Negative for appetite change, chills, fever and unexpected weight change.  HENT:   Negative for hearing loss, lump/mass and trouble swallowing.   Eyes:  Negative for eye problems and icterus.  Respiratory:  Negative for chest tightness, cough and shortness of breath.   Cardiovascular:  Negative  for chest pain, leg swelling and palpitations.  Gastrointestinal:  Negative for abdominal distention, abdominal pain, constipation, diarrhea, nausea and vomiting.  Endocrine: Negative for hot flashes.  Genitourinary:  Negative for difficulty urinating.   Musculoskeletal:  Negative for arthralgias.  Skin:  Negative for itching and rash.  Neurological:  Negative for dizziness, extremity weakness, headaches and numbness.  Hematological:  Negative for adenopathy. Does not bruise/bleed easily.  Psychiatric/Behavioral:  Negative for depression. The patient is not nervous/anxious.       PHYSICAL EXAMINATION  ECOG PERFORMANCE STATUS: 1 - Symptomatic but completely ambulatory  Vitals:   03/29/22 1458  BP: 98/68  Pulse: 87  Resp: 16  Temp: 97.9 F (36.6 C)  SpO2: 97%    Physical Exam Constitutional:      General: He is not in acute distress.    Appearance: Normal appearance. He is not toxic-appearing.  HENT:     Head: Normocephalic and atraumatic.  Eyes:     General: No scleral icterus. Cardiovascular:     Rate and Rhythm: Normal rate and regular rhythm.     Pulses: Normal pulses.     Heart sounds: Normal heart sounds.  Pulmonary:      Effort: Pulmonary effort is normal.     Breath sounds: Normal breath sounds.  Abdominal:     General: Abdomen is flat. Bowel sounds are normal. There is no distension.     Palpations: Abdomen is soft.     Tenderness: There is no abdominal tenderness.  Musculoskeletal:        General: No swelling.     Cervical back: Neck supple.  Lymphadenopathy:     Cervical: No cervical adenopathy.  Skin:    General: Skin is warm and dry.     Findings: No rash.  Neurological:     General: No focal deficit present.     Mental Status: He is alert.  Psychiatric:        Mood and Affect: Mood normal.        Behavior: Behavior normal.     LABORATORY DATA:  CBC    Component Value Date/Time   WBC 11.3 (H) 03/29/2022 1433   WBC 7.8 10/03/2019 1516   RBC 3.69 (L) 03/29/2022 1433   HGB 11.8 (L) 03/29/2022 1433   HGB 16.0 03/30/2021 1119   HCT 34.5 (L) 03/29/2022 1433   HCT 46.4 03/30/2021 1119   PLT 123 (L) 03/29/2022 1433   PLT 204 03/30/2021 1119   MCV 93.5 03/29/2022 1433   MCV 92 03/30/2021 1119   MCH 32.0 03/29/2022 1433   MCHC 34.2 03/29/2022 1433   RDW 12.9 03/29/2022 1433   RDW 12.4 03/30/2021 1119   LYMPHSABS 0.6 (L) 03/29/2022 1433   MONOABS 0.6 03/29/2022 1433   EOSABS 0.1 03/29/2022 1433   BASOSABS 0.0 03/29/2022 1433    CMP     Component Value Date/Time   NA 133 (L) 03/29/2022 1433   NA 140 10/13/2021 0954   K 4.0 03/29/2022 1433   CL 100 03/29/2022 1433   CO2 30 03/29/2022 1433   GLUCOSE 110 (H) 03/29/2022 1433   BUN 27 (H) 03/29/2022 1433   BUN 20 10/13/2021 0954   CREATININE 0.77 03/29/2022 1433   CALCIUM 9.0 03/29/2022 1433   PROT 6.8 03/30/2021 1119   ALBUMIN 4.1 03/30/2021 1119   AST 20 03/30/2021 1119   ALT 18 03/30/2021 1119   ALKPHOS 73 03/30/2021 1119   BILITOT 0.4 03/30/2021 1119   GFRNONAA >60 03/29/2022 1433  GFRAA 103 03/04/2020 1431          ASSESSMENT and THERAPY PLAN:   Malignant neoplasm of base of tongue (HCC) Clarence Andrade is a  76 year old man with history of stage II squamous cell carcinoma at the base of his tongue.  He continues on weekly chemoradiation with good tolerance.  I reviewed his labs with him today which are stable and he will proceed with treatment tomorrow.  We discussed fluid intake along with oral intake which she is doing moderately well with.  We also discussed activity and how it can improve fatigue.  Clarence Andrade will return daily for his radiation and next week for labs, follow-up, and his next chemotherapy.   All questions were answered. The patient knows to call the clinic with any problems, questions or concerns. We can certainly see the patient much sooner if necessary.  Total encounter time:20 minutes*in face-to-face visit time, chart review, lab review, care coordination, order entry, and documentation of the encounter time.    Clarence Bihari, NP 04/01/22 2:31 PM Medical Oncology and Hematology [facility] [place], [place] [address] Tel. [phone]    Fax. [phone]  *Total Encounter Time as defined by the Centers for Medicare and Medicaid Services includes, in addition to the face-to-face time of a patient visit (documented in the note above) non-face-to-face time: obtaining and reviewing outside history, ordering and reviewing medications, tests or procedures, care coordination (communications with other health care professionals or caregivers) and documentation in the medical record.

## 2022-03-30 ENCOUNTER — Ambulatory Visit
Admission: RE | Admit: 2022-03-30 | Discharge: 2022-03-30 | Disposition: A | Discharge status: Home / Self Care | Payer: PPO | Source: Ambulatory Visit | Attending: Radiation Oncology | Admitting: Radiation Oncology

## 2022-03-30 ENCOUNTER — Other Ambulatory Visit: Payer: Self-pay

## 2022-03-30 ENCOUNTER — Inpatient Hospital Stay: Payer: PPO

## 2022-03-30 VITALS — BP 95/61 | HR 86 | Temp 98.4°F | Resp 17

## 2022-03-30 DIAGNOSIS — Z5111 Encounter for antineoplastic chemotherapy: Secondary | ICD-10-CM | POA: Diagnosis not present

## 2022-03-30 DIAGNOSIS — C01 Malignant neoplasm of base of tongue: Secondary | ICD-10-CM

## 2022-03-30 DIAGNOSIS — C109 Malignant neoplasm of oropharynx, unspecified: Secondary | ICD-10-CM | POA: Diagnosis not present

## 2022-03-30 DIAGNOSIS — Z510 Encounter for antineoplastic radiation therapy: Secondary | ICD-10-CM | POA: Diagnosis not present

## 2022-03-30 LAB — RAD ONC ARIA SESSION SUMMARY
Course Elapsed Days: 21
Plan Fractions Treated to Date: 17
Plan Prescribed Dose Per Fraction: 2 Gy
Plan Total Fractions Prescribed: 35
Plan Total Prescribed Dose: 70 Gy
Reference Point Dosage Given to Date: 34 Gy
Reference Point Session Dosage Given: 2 Gy
Session Number: 17

## 2022-03-30 MED ORDER — SODIUM CHLORIDE 0.9 % IV SOLN: Freq: Once | INTRAVENOUS | Status: AC

## 2022-03-30 MED ORDER — POTASSIUM CHLORIDE IN NACL 20-0.9 MEQ/L-% IV SOLN
Freq: Once | INTRAVENOUS | Status: AC
  Filled 2022-03-30: qty 1000

## 2022-03-30 MED ORDER — SODIUM CHLORIDE 0.9 % IV SOLN
10.0000 mg | Freq: Once | INTRAVENOUS | Status: AC
  Administered 2022-03-30: 10 mg via INTRAVENOUS
  Filled 2022-03-30: qty 10

## 2022-03-30 MED ORDER — HEPARIN SOD (PORK) LOCK FLUSH 100 UNIT/ML IV SOLN
500.0000 [IU] | Freq: Once | INTRAVENOUS | Status: AC | PRN
  Administered 2022-03-30: 500 [IU]

## 2022-03-30 MED ORDER — SODIUM CHLORIDE 0.9 % IV SOLN
150.0000 mg | Freq: Once | INTRAVENOUS | Status: AC
  Administered 2022-03-30: 150 mg via INTRAVENOUS
  Filled 2022-03-30: qty 150

## 2022-03-30 MED ORDER — SODIUM CHLORIDE 0.9% FLUSH
10.0000 mL | INTRAVENOUS | Status: DC | PRN
  Administered 2022-03-30: 10 mL

## 2022-03-30 MED ORDER — MAGNESIUM SULFATE 2 GM/50ML IV SOLN
2.0000 g | Freq: Once | INTRAVENOUS | Status: AC
  Administered 2022-03-30: 2 g via INTRAVENOUS
  Filled 2022-03-30: qty 50

## 2022-03-30 MED ORDER — PALONOSETRON HCL INJECTION 0.25 MG/5ML
0.2500 mg | Freq: Once | INTRAVENOUS | Status: AC
  Administered 2022-03-30: 0.25 mg via INTRAVENOUS
  Filled 2022-03-30: qty 5

## 2022-03-30 MED ORDER — SODIUM CHLORIDE 0.9 % IV SOLN
39.5000 mg/m2 | Freq: Once | INTRAVENOUS | Status: AC
  Administered 2022-03-30: 100 mg via INTRAVENOUS
  Filled 2022-03-30: qty 100

## 2022-03-30 NOTE — Patient Instructions (Signed)
[facility] MEDICAL ONCOLOGY  Discharge Instructions: Thank you for choosing [facility] to provide your oncology and hematology care.   If you have a lab appointment with the Cancer Center, please go directly to the Cancer Center and check in at the registration area.   Wear comfortable clothing and clothing appropriate for easy access to any Portacath or PICC line.   We strive to give you quality time with your provider. You may need to reschedule your appointment if you arrive late (15 or more minutes).  Arriving late affects you and other patients whose appointments are after yours.  Also, if you miss three or more appointments without notifying the office, you may be dismissed from the clinic at the provider's discretion.      For prescription refill requests, have your pharmacy contact our office and allow 72 hours for refills to be completed.    Today you received the following chemotherapy and/or immunotherapy agents: Cisplatin.       To help prevent nausea and vomiting after your treatment, we encourage you to take your nausea medication as directed.  BELOW ARE SYMPTOMS THAT SHOULD BE REPORTED IMMEDIATELY: *FEVER GREATER THAN 100.4 F (38 C) OR HIGHER *CHILLS OR SWEATING *NAUSEA AND VOMITING THAT IS NOT CONTROLLED WITH YOUR NAUSEA MEDICATION *UNUSUAL SHORTNESS OF BREATH *UNUSUAL BRUISING OR BLEEDING *URINARY PROBLEMS (pain or burning when urinating, or frequent urination) *BOWEL PROBLEMS (unusual diarrhea, constipation, pain near the anus) TENDERNESS IN MOUTH AND THROAT WITH OR WITHOUT PRESENCE OF ULCERS (sore throat, sores in mouth, or a toothache) UNUSUAL RASH, SWELLING OR PAIN  UNUSUAL VAGINAL DISCHARGE OR ITCHING   Items with * indicate a potential emergency and should be followed up as soon as possible or go to the Emergency Department if any problems should occur.  Please show the CHEMOTHERAPY ALERT CARD or IMMUNOTHERAPY ALERT CARD at check-in  to the Emergency Department and triage nurse.  Should you have questions after your visit or need to cancel or reschedule your appointment, please contact [facility] MEDICAL ONCOLOGY  Dept: [phone]  and follow the prompts.  Office hours are 8:00 a.m. to 4:30 p.m. Monday - Friday. Please note that voicemails left after 4:00 p.m. may not be returned until the following business day.  We are closed weekends and major holidays. You have access to a nurse at all times for urgent questions. Please call the main number to the clinic Dept: [phone] and follow the prompts.   For any non-urgent questions, you may also contact your provider using MyChart. We now offer e-Visits for anyone 18 and older to request care online for non-urgent symptoms. For details visit [email or web address].   Also download the MyChart app! Go to the app store, search "MyChart", open the app, select [facility], and log in with your MyChart username and password.  Masks are optional in the cancer centers. If you would like for your care team to wear a mask while they are taking care of you, please let them know. You may have one support Meira Wahba who is at least 76 years old accompany you for your appointments. 

## 2022-04-01 ENCOUNTER — Encounter: Payer: Self-pay | Admitting: Hematology and Oncology

## 2022-04-01 NOTE — Assessment & Plan Note (Signed)
Clarence Andrade is a 76 year old man with history of stage II squamous cell carcinoma at the base of his tongue.  He continues on weekly chemoradiation with good tolerance.  I reviewed his labs with him today which are stable and he will proceed with treatment tomorrow.  We discussed fluid intake along with oral intake which she is doing moderately well with.  We also discussed activity and how it can improve fatigue.  Clarence Andrade will return daily for his radiation and next week for labs, follow-up, and his next chemotherapy.

## 2022-04-04 ENCOUNTER — Other Ambulatory Visit: Payer: Self-pay

## 2022-04-04 ENCOUNTER — Ambulatory Visit
Admission: RE | Admit: 2022-04-04 | Discharge: 2022-04-04 | Disposition: A | Discharge status: Home / Self Care | Payer: PPO | Source: Ambulatory Visit | Attending: Radiation Oncology | Admitting: Radiation Oncology

## 2022-04-04 DIAGNOSIS — C01 Malignant neoplasm of base of tongue: Secondary | ICD-10-CM | POA: Diagnosis not present

## 2022-04-04 DIAGNOSIS — Z5111 Encounter for antineoplastic chemotherapy: Secondary | ICD-10-CM | POA: Diagnosis not present

## 2022-04-04 DIAGNOSIS — C109 Malignant neoplasm of oropharynx, unspecified: Secondary | ICD-10-CM | POA: Diagnosis not present

## 2022-04-04 DIAGNOSIS — Z510 Encounter for antineoplastic radiation therapy: Secondary | ICD-10-CM | POA: Diagnosis not present

## 2022-04-04 LAB — RAD ONC ARIA SESSION SUMMARY
Course Elapsed Days: 26
Plan Fractions Treated to Date: 18
Plan Prescribed Dose Per Fraction: 2 Gy
Plan Total Fractions Prescribed: 35
Plan Total Prescribed Dose: 70 Gy
Reference Point Dosage Given to Date: 36 Gy
Reference Point Session Dosage Given: 2 Gy
Session Number: 18

## 2022-04-05 ENCOUNTER — Other Ambulatory Visit: Payer: Self-pay

## 2022-04-05 ENCOUNTER — Encounter: Payer: Self-pay | Admitting: Hematology and Oncology

## 2022-04-05 ENCOUNTER — Inpatient Hospital Stay (HOSPITAL_BASED_OUTPATIENT_CLINIC_OR_DEPARTMENT_OTHER): Payer: PPO | Admitting: Hematology and Oncology

## 2022-04-05 ENCOUNTER — Inpatient Hospital Stay: Payer: PPO

## 2022-04-05 ENCOUNTER — Ambulatory Visit
Admission: RE | Admit: 2022-04-05 | Discharge: 2022-04-05 | Disposition: A | Discharge status: Home / Self Care | Payer: PPO | Source: Ambulatory Visit | Attending: Radiation Oncology | Admitting: Radiation Oncology

## 2022-04-05 DIAGNOSIS — Z510 Encounter for antineoplastic radiation therapy: Secondary | ICD-10-CM | POA: Diagnosis not present

## 2022-04-05 DIAGNOSIS — C01 Malignant neoplasm of base of tongue: Secondary | ICD-10-CM

## 2022-04-05 DIAGNOSIS — C109 Malignant neoplasm of oropharynx, unspecified: Secondary | ICD-10-CM | POA: Diagnosis not present

## 2022-04-05 DIAGNOSIS — Z5111 Encounter for antineoplastic chemotherapy: Secondary | ICD-10-CM | POA: Diagnosis not present

## 2022-04-05 LAB — BASIC METABOLIC PANEL - CANCER CENTER ONLY
Anion gap: 4 — ABNORMAL LOW (ref 5–15)
BUN: 27 mg/dL — ABNORMAL HIGH (ref 8–23)
CO2: 31 mmol/L (ref 22–32)
Calcium: 9.2 mg/dL (ref 8.9–10.3)
Chloride: 101 mmol/L (ref 98–111)
Creatinine: 0.94 mg/dL (ref 0.61–1.24)
GFR, Estimated: >60 mL/min (ref >60)
Glucose, Bld: 108 mg/dL — ABNORMAL HIGH (ref 70–99)
Potassium: 4.6 mmol/L (ref 3.5–5.1)
Sodium: 136 mmol/L (ref 135–145)

## 2022-04-05 LAB — RAD ONC ARIA SESSION SUMMARY
Course Elapsed Days: 27
Plan Fractions Treated to Date: 19
Plan Prescribed Dose Per Fraction: 2 Gy
Plan Total Fractions Prescribed: 35
Plan Total Prescribed Dose: 70 Gy
Reference Point Dosage Given to Date: 38 Gy
Reference Point Session Dosage Given: 2 Gy
Session Number: 19

## 2022-04-05 LAB — CBC WITH DIFFERENTIAL (CANCER CENTER ONLY)
Abs Immature Granulocytes: 0.01 10*3/uL (ref 0.00–0.07)
Basophils Absolute: 0 10*3/uL (ref 0.0–0.1)
Basophils Relative: 0 %
Eosinophils Absolute: 0 10*3/uL (ref 0.0–0.5)
Eosinophils Relative: 1 %
HCT: 35.2 % — ABNORMAL LOW (ref 39.0–52.0)
Hemoglobin: 12.1 g/dL — ABNORMAL LOW (ref 13.0–17.0)
Immature Granulocytes: 0 %
Lymphocytes Relative: 10 %
Lymphs Abs: 0.4 10*3/uL — ABNORMAL LOW (ref 0.7–4.0)
MCH: 32 pg (ref 26.0–34.0)
MCHC: 34.4 g/dL (ref 30.0–36.0)
MCV: 93.1 fL (ref 80.0–100.0)
Monocytes Absolute: 0.4 10*3/uL (ref 0.1–1.0)
Monocytes Relative: 11 %
Neutro Abs: 3 10*3/uL (ref 1.7–7.7)
Neutrophils Relative %: 78 %
Platelet Count: 146 10*3/uL — ABNORMAL LOW (ref 150–400)
RBC: 3.78 MIL/uL — ABNORMAL LOW (ref 4.22–5.81)
RDW: 13.1 % (ref 11.5–15.5)
WBC Count: 3.9 10*3/uL — ABNORMAL LOW (ref 4.0–10.5)
nRBC: 0 % (ref 0.0–0.2)

## 2022-04-05 LAB — MAGNESIUM: Magnesium: 1.5 mg/dL — ABNORMAL LOW (ref 1.7–2.4)

## 2022-04-05 MED ORDER — HEPARIN SOD (PORK) LOCK FLUSH 100 UNIT/ML IV SOLN
500.0000 [IU] | Freq: Once | INTRAVENOUS | Status: AC
  Administered 2022-04-05: 500 [IU] via INTRAVENOUS

## 2022-04-05 MED ORDER — SODIUM CHLORIDE 0.9% FLUSH
10.0000 mL | INTRAVENOUS | Status: DC | PRN
  Administered 2022-04-05: 10 mL via INTRAVENOUS

## 2022-04-05 MED FILL — Fosaprepitant Dimeglumine For IV Infusion 150 MG (Base Eq): INTRAVENOUS | Qty: 5 | Status: AC

## 2022-04-05 MED FILL — Dexamethasone Sodium Phosphate Inj 100 MG/10ML: INTRAMUSCULAR | Qty: 1 | Status: AC

## 2022-04-05 NOTE — Progress Notes (Unsigned)
[facility] NOTE  Patient Care Team: Kathalene Frames, MD as PCP - General (Internal Medicine) Sueanne Margarita, MD as PCP - Cardiology (Cardiology) Constance Haw, MD as PCP - Electrophysiology (Cardiology) Malmfelt, Stephani Police, RN as Oncology Nurse Navigator Benay Pike, MD as Consulting Physician (Hematology and Oncology) [name], MD as Consulting Physician (Radiation Oncology)  CHIEF COMPLAINTS/PURPOSE OF CONSULTATION:  Squamous cell carcinoma of the oropharynx  ASSESSMENT & PLAN:  Malignant neoplasm of base of tongue [facility]) This is a very pleasant 76 year old male patient with 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  PET/CT without any evidence of distant metastatic disease.  He is currently on concurrent chemo and radiation, status post 4weekly cycles of cisplatin.  No adverse effects from chemotherapy except for fatigue. He is very pleased so far with clinical response with tremendous improvement in his cervical lymphadenopathy, improvement in otalgia and tinnitus. Residual disease on clinical exam noted. Labs appear satisfactory to proceed with chemo as planned for tomorrow.   Mucositis, mild at this time, continue viscous lidocaine as needed. He says it is mild and he hated the taste of lidocaine that he would rather suffer with the pain. Ok to proceed with chemo if all labs are within parameters. RTC in one week or sooner as needed I offered fluids for him, he refused it at this time.  No orders of the defined types were placed in this encounter.  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  PET/CT without any  evidence of distant metastatic disease.  He is currently on concurrent chemo and radiation, status post 4weekly cycles of cisplatin.  No adverse effects from chemotherapy except for fatigue. He is very pleased so far with clinical response with tremendous improvement in his cervical lymphadenopathy, improvement in otalgia and tinnitus. Residual disease on clinical exam noted. Labs appear satisfactory to proceed with chemo as planned for tomorrow.   Mucositis, mild at this time, continue viscous lidocaine as needed. He says it is mild and he hated the taste of lidocaine that he would rather suffer with the pain. Ok to proceed with chemo if all labs are within parameters. RTC in one week or sooner as needed I offered fluids for him, he refused it at this time.     HISTORY OF PRESENTING ILLNESS:   Clarence Andrade 76 y.o. male is here because of SCC oropharynx.  Oncologic History  This is a very pleasant 76 year old male patient who was seen by ENT with chief complaint of lump in his neck ongoing for the past 1 to 2 months.  Around July he suddenly noticed the lump in the neck.  He was initially treated by antibiotics but this has not changed.  He also has started noticing some difficulty swallowing and some dysgeusia.  He tells me that he has lost about 20 pounds of weight in the past 2 to 3 months but part of it could be intentional.  He was seen by Dr. Synetta Shadow on September 20 for an initial consultation and he had flexible fiberoptic endoscopy the same day which noticed large base of tongue tumor.  This tumor measured at least 2 to 3 cm in diameter extending across  midline.  He also had a CT on September 27 which confirmed 4 cm mainly exophytic mass centered at the left base of the tongue with at least 1 ipsilateral malignant node measuring nearly 4 cm with signs of extracapsular extension.  Findings consistent with metastatic squamous cell carcinoma.  PET/CT done on October 13 with no  findings of metastatic disease, large left-sided hypermetabolic oropharyngeal neoplasm and adjacent left-sided metastatic lymphadenopathy.  Interval history  Patient is here for follow-up after fourth cycle of cisplatin.   He is here with his wife. HE says he is exhausted to even walk at times. He otherwise denies much pain at all. No nausea or vomiting. No change in hearing. No neuropathy. Rest of the pertinent 10 point ROS reviewed and negative.  REVIEW OF SYSTEMS:   Constitutional: Denies fevers, chills or abnormal night sweats Eyes: Denies blurriness of vision, double vision or watery eyes Ears, nose, mouth, throat, and face: Denies mucositis or sore throat Respiratory: Denies cough, dyspnea or wheezes Cardiovascular: Denies palpitation, chest discomfort or lower extremity swelling Gastrointestinal:  Denies nausea, heartburn or change in bowel habits Skin: Denies abnormal skin rashes Lymphatics: Denies new lymphadenopathy or easy bruising Neurological:Denies numbness, tingling or new weaknesses Behavioral/Psych: Mood is stable, no new changes  All other systems were reviewed with the patient and are negative.  MEDICAL HISTORY:  Past Medical History:  Diagnosis Date   Abdominal hernia    Midline   Adrenal incidentaloma ([place]) 2010   Aortic stenosis    Ascending aortic aneurysm ([place])    a. 4.4cm by imaging 03/2020.   CAD (coronary artery disease)    a. Cath 03/2018 - CTO of the prox-mid LAD with bridging, L-L and R-L collaterals, moderate, non-obstructive disease involving the proximal LAD, proximal LCx, and distal RCA, more severe disease noted involving small branch vessels (RV marginal and superior branch of OM3).   Complete heart block (HCC)    Gallstones    Heart murmur    History of pacemaker    a. 11/2018 - Medtronic, intermittent CHB.   Hyperlipidemia    Morbid obesity (HCC)    Myocardial infarction [facility])    NSVT (nonsustained ventricular tachycardia) (HCC)    OSA  (obstructive sleep apnea)    Severe PSG 1/07 AHI 66/hr, O2 Nadir 50% Refuses treatment - Pt does not believe test was accurate   Pericarditis    Pneumonia    Pulmonary HTN (HCC)    mild with PASP 71mHg by echo 02/2018, not seen on 03/2020 echo   Pulmonary nodule    Screening for AAA (abdominal aortic aneurysm) 2010   CT Abd/Pelvis   Ventral hernia    03/2009    SURGICAL HISTORY: Past Surgical History:  Procedure Laterality Date   CHOLECYSTECTOMY N/A 10/11/2019   Procedure: LAPAROSCOPIC CHOLECYSTECTOMY WITH INTRAOPERATIVE CHOLANGIOGRAM;  Surgeon: GArmandina Gemma MD;  Location: [facility];  Service: General;  Laterality: N/A;   CORONARY ANGIOGRAPHY N/A 03/23/2018   Procedure: CORONARY ANGIOGRAPHY;  Surgeon: ENelva Bush MD;  [facility];  Service: Cardiovascular;  Laterality: N/A;   HERNIA REPAIR  05/2002   three   IR GASTROSTOMY TUBE MOD SED  03/08/2022   IR IMAGING GUIDED PORT INSERTION  03/08/2022   KNEE ARTHROSCOPY Left 09/2002   PACEMAKER IMPLANT N/A 11/08/2018   Procedure: PACEMAKER IMPLANT;  Surgeon: CConstance Haw MD;  [facility];  Service: Cardiovascular;  Laterality: N/A;   SHOULDER SURGERY Right 2020   TOTAL KNEE ARTHROPLASTY Bilateral  09/2010   VENTRAL HERNIA REPAIR  03/2009    SOCIAL HISTORY: Social History   Socioeconomic History   Marital status: Married    Spouse name: Not on file   Number of children: Not on file   Years of education: Not on file   Highest education level: Not on file  Occupational History   Not on file  Tobacco Use   Smoking status: Former    Packs/day: 0.50    Types: Cigarettes    Quit date: 63    Years since quitting: 30.9   Smokeless tobacco: Never  Vaping Use   Vaping Use: Never used  Substance and Sexual Activity   Alcohol use: No   Drug use: No   Sexual activity: Not Currently  Other Topics Concern   Not on file  Social History Narrative   Not on file   Social Determinants  of Health   Financial Resource Strain: Low Risk  (02/24/2022)   Overall Financial Resource Strain (CARDIA)    Difficulty of Paying Living Expenses: Not hard at all  Food Insecurity: No Food Insecurity (02/24/2022)   Hunger Vital Sign    Worried About Running Out of Food in the Last Year: Never true    Ran Out of Food in the Last Year: Never true  Transportation Needs: No Transportation Needs (02/24/2022)   PRAPARE - Hydrologist (Medical): No    Lack of Transportation (Non-Medical): No  Physical Activity: Not on file  Stress: Not on file  Social Connections: Not on file  Intimate Partner Violence: Not on file    FAMILY HISTORY: Family History  Problem Relation Age of Onset   Heart attack Father    Heart disease Father    Aneurysm Father    Breast cancer Sister    Colon cancer Sister     ALLERGIES:  is allergic to sertraline hcl.  MEDICATIONS:  Current Outpatient Medications  Medication Sig Dispense Refill   acetaminophen (TYLENOL) 500 MG tablet Take 1,000 mg by mouth every 6 (six) hours as needed (pain).     aspirin EC 81 MG tablet Take 81 mg by mouth daily.     buPROPion (WELLBUTRIN XL) 150 MG 24 hr tablet Take 150 mg by mouth every morning.     COVID-19 mRNA bivalent vaccine, Pfizer, (PFIZER COVID-19 VAC BIVALENT) injection Inject into the muscle. 0.3 mL 0   COVID-19 mRNA Vac-TriS, Pfizer, (PFIZER-BIONT COVID-19 VAC-TRIS) SUSP injection Inject into the muscle. 0.3 mL 0   COVID-19 mRNA vaccine 2023-2024 (COMIRNATY) syringe Inject into the muscle. 0.3 mL 0   dexamethasone (DECADRON) 4 MG tablet Take 2 tablets daily x 3 days starting the day after cisplatin chemotherapy. Take with food. 30 tablet 1   influenza vaccine adjuvanted (FLUAD) 0.5 ML injection Inject into the muscle. 0.5 mL 0   isosorbide mononitrate (IMDUR) 30 MG 24 hr tablet TAKE 1 TABLET ONCE DAILY. 90 tablet 0   lidocaine (XYLOCAINE) 2 % solution Patient: Mix 1part 2% viscous  lidocaine, 1part H20. Swish & swallow 72m of diluted mixture, 370m before meals and at bedtime, up to QID 200 mL 3   lidocaine-prilocaine (EMLA) cream Apply to affected area once 30 g 3   metoprolol succinate (TOPROL-XL) 25 MG 24 hr tablet Take 0.5 tablets (12.5 mg total) by mouth daily. 90 tablet 3   Multiple Vitamin (MULTIVITAMIN) tablet Take 1 tablet by mouth daily.     nitroGLYCERIN (NITROSTAT) 0.4 MG SL tablet Place 1 tablet (  0.4 mg total) under the tongue every 5 (five) minutes as needed for chest pain. (Patient not taking: Reported on 03/29/2022) 25 tablet 3   ondansetron (ZOFRAN) 8 MG tablet Take 1 tablet (8 mg total) by mouth every 8 (eight) hours as needed for nausea or vomiting. Start on the third day after cisplatin. (Patient not taking: Reported on 03/29/2022) 30 tablet 1   prochlorperazine (COMPAZINE) 10 MG tablet Take 1 tablet (10 mg total) by mouth every 6 (six) hours as needed (Nausea or vomiting). (Patient not taking: Reported on 03/29/2022) 30 tablet 1   rosuvastatin (CRESTOR) 10 MG tablet TAKE 1 TABLET ONCE DAILY. 90 tablet 0   RSV vaccine recomb adjuvanted (AREXVY) 120 MCG/0.5ML injection Inject into the muscle. 0.5 mL 0   Sulfamethoxazole-Trimethoprim (SULFAMETHOXAZOLE-TMP DS PO) Take by mouth. (Patient not taking: Reported on 03/29/2022)     tamsulosin (FLOMAX) 0.4 MG CAPS capsule Take 0.4 mg by mouth daily.     temazepam (RESTORIL) 15 MG capsule Take 1 capsule (15 mg total) by mouth at bedtime as needed for sleep. 30 capsule 0   No current facility-administered medications for this visit.     PHYSICAL EXAMINATION: ECOG PERFORMANCE STATUS: 0 - Asymptomatic  Vitals:   04/05/22 1436  BP: 107/71  Pulse: 77  Resp: 16  Temp: 97.9 F (36.6 C)  SpO2: 96%   Filed Weights   04/05/22 1436  Weight: 262 lb 14.4 oz (119.3 kg)    Physical Exam Constitutional:      Appearance: Normal appearance.  HENT:     Mouth/Throat:     Mouth: Mucous membranes are moist.      Pharynx: Posterior oropharyngeal erythema (mild mucositis) present.  Cardiovascular:     Rate and Rhythm: Normal rate and regular rhythm.     Pulses: Normal pulses.     Heart sounds: Normal heart sounds.  Musculoskeletal:        General: Normal range of motion.     Cervical back: Normal range of motion and neck supple. No rigidity.  Lymphadenopathy:     Cervical: Cervical adenopathy (Cervical lymphadenopathy significanty improved, residual lymphadenopathy noted) present.  Skin:    General: Skin is warm and dry.  Neurological:     Mental Status: He is alert.    LABORATORY DATA:  I have reviewed the data as listed Lab Results  Component Value Date   WBC 3.9 (L) 04/05/2022   HGB 12.1 (L) 04/05/2022   HCT 35.2 (L) 04/05/2022   MCV 93.1 04/05/2022   PLT 146 (L) 04/05/2022     Chemistry      Component Value Date/Time   NA 136 04/05/2022 1413   NA 140 10/13/2021 0954   K 4.6 04/05/2022 1413   CL 101 04/05/2022 1413   CO2 31 04/05/2022 1413   BUN 27 (H) 04/05/2022 1413   BUN 20 10/13/2021 0954   CREATININE 0.94 04/05/2022 1413      Component Value Date/Time   CALCIUM 9.2 04/05/2022 1413   ALKPHOS 73 03/30/2021 1119   AST 20 03/30/2021 1119   ALT 18 03/30/2021 1119   BILITOT 0.4 03/30/2021 1119       RADIOGRAPHIC STUDIES: I have personally reviewed the radiological images as listed and agreed with the findings in the report. IR Gastrostomy Tube  Result Date: 03/08/2022 INDICATION: Head and neck cancer. EXAM: PERCUTANEOUS GASTROSTOMY TUBE PLACEMENT COMPARISON:  PET-CT, 02/18/2022. MEDICATIONS: Ancef 3 gm IV; Antibiotics were administered within 1 hour of the procedure. 0.5 mg glucagon IV  was administered. CONTRAST:  20 mL mL of Isovue 300 administered into the gastric lumen. ANESTHESIA/SEDATION: Moderate (conscious) sedation was employed during this procedure. A total of Versed 3 mg and Fentanyl 150 mcg was administered intravenously. Moderate Sedation Time: 37 minutes. The  patient's level of consciousness and vital signs were monitored continuously by radiology nursing throughout the procedure under my direct supervision. FLUOROSCOPY TIME:  Fluoroscopic dose; 4 mGy COMPLICATIONS: None immediate. PROCEDURE: Informed written consent was obtained from the patient and/or patient's representative following explanation of the procedure, risks, benefits and alternatives. A time out was performed prior to the initiation of the procedure. Maximal barrier sterile technique utilized including caps, mask, sterile gowns, sterile gloves, large sterile drape, hand hygiene and Betadine prep. The LEFT upper quadrant was sterilely prepped and draped. A oral gastric catheter was inserted into the stomach under fluoroscopy. The existing nasogastric feeding tube was removed. The left costal margin and barium opacified transverse colon were identified and avoided. Air was injected into the stomach for insufflation and visualization under fluoroscopy. Under sterile conditions and local anesthesia, 2 T tacks were utilized to pexy the anterior aspect of the stomach against the ventral abdominal wall. Contrast injection confirmed appropriate positioning of each of the T tacks. An incision was made between the T tacks and a 17 gauge trocar needle was utilized to access the stomach. Needle position was confirmed within the stomach with aspiration of air and injection of a small amount of contrast. A stiff Glidewire was advanced into the gastric lumen and under intermittent fluoroscopic guidance, then was exchanged for a telescoping peel-away sheath, ultimately allowing placement of a 18 Fr balloon retention gastrostomy tube. The retention balloon was insufflated with a mixture of dilute saline and contrast and pulled taut against the anterior wall of the stomach. The external disc was cinched. Contrast injection confirms positioning within the stomach. Several spot radiographic images were obtained in various  obliquities for documentation. The patient tolerated procedure well without immediate post procedural complication. FINDINGS: After successful fluoroscopic guided placement, the gastrostomy tube is appropriately positioned with internal retention balloon against the ventral aspect of the gastric lumen. IMPRESSION: Successful placement of an 18 Fr balloon retention percutaneous gastrostomy tube, as above. PLAN: The gastrostomy may be used immediately for medication administration and in 24 hrs for the initiation of feeds. The patient will return to Vascular Interventional Radiology (VIR) for routine gastrostomy exchange in 6 months. Michaelle Birks, MD [facility] Radiology Electronically Signed   By: Michaelle Birks M.D.   On: 03/08/2022 19:36   IR IMAGING GUIDED PORT INSERTION  Result Date: 03/08/2022 INDICATION: Head and neck cancer EXAM: IMPLANTED PORT A CATH PLACEMENT WITH ULTRASOUND AND FLUOROSCOPIC GUIDANCE MEDICATIONS: None ANESTHESIA/SEDATION: Procedure performed concurrently with gastrostomy placement. For combined sedation details, including medication doses and time, please see concurrent percutaneous gastrostomy report. FLUOROSCOPY TIME:  Fluoroscopic dose; 0 mGy COMPLICATIONS: None immediate. PROCEDURE: The procedure, risks, benefits, and alternatives were explained to the patient. Questions regarding the procedure were encouraged and answered. The patient understands and consents to the procedure. The RIGHT neck and chest were prepped with chlorhexidine in a sterile fashion, and a sterile drape was applied covering the operative field. Maximum barrier sterile technique with sterile gowns and gloves were used for the procedure. A timeout was performed prior to the initiation of the procedure. Local anesthesia was provided with 1% lidocaine with epinephrine. After creating a small venotomy incision, a micropuncture kit was utilized to access the  internal  jugular vein under direct, real-time ultrasound guidance. Ultrasound image documentation was performed. The microwire was kinked to measure appropriate catheter length. A subcutaneous port pocket was then created along the upper chest wall utilizing a combination of sharp and blunt dissection. The pocket was irrigated with sterile saline. A single lumen ISP power injectable port was chosen for placement. The 8 Fr catheter was tunneled from the port pocket site to the venotomy incision. The port was placed in the pocket. The external catheter was trimmed to appropriate length. At the venotomy, an 8 Fr peel-away sheath was placed over a guidewire under fluoroscopic guidance. The catheter was then placed through the sheath and the sheath was removed. Final catheter positioning was confirmed and documented with a fluoroscopic spot radiograph. The port was accessed with a Huber needle, aspirated and flushed with heparinized saline. The port pocket incision was closed with interrupted 3-0 Vicryl suture then Dermabond was applied, including at the venotomy incision. Dressings were placed. The patient tolerated the procedure well without immediate post procedural complication. IMPRESSION: Successful placement of a RIGHT internal jugular approach power injectable Port-A-Cath. The tip of the catheter is positioned within the proximal RIGHT atrium. The catheter is ready for immediate use. Michaelle Birks, MD [facility] Radiology Electronically Signed   By: Michaelle Birks M.D.   On: 03/08/2022 19:30    All questions were answered. The patient knows to call the clinic with any problems, questions or concerns. I spent 30 minutes in the care of this patient including H and P, review of records, counseling and coordination of care.     Benay Pike, MD 04/05/2022 11:56 PM

## 2022-04-05 NOTE — Assessment & Plan Note (Signed)
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  PET/CT without any evidence of distant metastatic disease.  He is currently on concurrent chemo and radiation, status post 4weekly cycles of cisplatin.  No adverse effects from chemotherapy except for fatigue. He is very pleased so far with clinical response with tremendous improvement in his cervical lymphadenopathy, improvement in otalgia and tinnitus. Residual disease on clinical exam noted. Labs appear satisfactory to proceed with chemo as planned for tomorrow.   Mucositis, mild at this time, continue viscous lidocaine as needed. He says it is mild and he hated the taste of lidocaine that he would rather suffer with the pain. Ok to proceed with chemo if all labs are within parameters. RTC in one week or sooner as needed I offered fluids for him, he refused it at this time.

## 2022-04-06 ENCOUNTER — Inpatient Hospital Stay: Payer: PPO

## 2022-04-06 ENCOUNTER — Encounter: Payer: Self-pay | Admitting: Hematology and Oncology

## 2022-04-06 ENCOUNTER — Other Ambulatory Visit: Payer: Self-pay

## 2022-04-06 ENCOUNTER — Inpatient Hospital Stay: Payer: PPO | Admitting: Dietician

## 2022-04-06 ENCOUNTER — Ambulatory Visit
Admission: RE | Admit: 2022-04-06 | Discharge: 2022-04-06 | Disposition: A | Discharge status: Home / Self Care | Payer: PPO | Source: Ambulatory Visit | Attending: Radiation Oncology | Admitting: Radiation Oncology

## 2022-04-06 VITALS — BP 92/62 | HR 88 | Temp 98.6°F | Resp 16

## 2022-04-06 DIAGNOSIS — Z510 Encounter for antineoplastic radiation therapy: Secondary | ICD-10-CM | POA: Diagnosis not present

## 2022-04-06 DIAGNOSIS — C01 Malignant neoplasm of base of tongue: Secondary | ICD-10-CM

## 2022-04-06 DIAGNOSIS — Z5111 Encounter for antineoplastic chemotherapy: Secondary | ICD-10-CM | POA: Diagnosis not present

## 2022-04-06 DIAGNOSIS — C109 Malignant neoplasm of oropharynx, unspecified: Secondary | ICD-10-CM | POA: Diagnosis not present

## 2022-04-06 LAB — RAD ONC ARIA SESSION SUMMARY
Course Elapsed Days: 28
Plan Fractions Treated to Date: 20
Plan Prescribed Dose Per Fraction: 2 Gy
Plan Total Fractions Prescribed: 35
Plan Total Prescribed Dose: 70 Gy
Reference Point Dosage Given to Date: 40 Gy
Reference Point Session Dosage Given: 2 Gy
Session Number: 20

## 2022-04-06 MED ORDER — MAGNESIUM SULFATE 4 GM/100ML IV SOLN
4.0000 g | Freq: Once | INTRAVENOUS | Status: AC
  Administered 2022-04-06: 4 g via INTRAVENOUS
  Filled 2022-04-06: qty 100

## 2022-04-06 MED ORDER — SODIUM CHLORIDE 0.9% FLUSH
10.0000 mL | INTRAVENOUS | Status: DC | PRN
  Administered 2022-04-06: 10 mL

## 2022-04-06 MED ORDER — PALONOSETRON HCL INJECTION 0.25 MG/5ML
0.2500 mg | Freq: Once | INTRAVENOUS | Status: AC
  Administered 2022-04-06: 0.25 mg via INTRAVENOUS
  Filled 2022-04-06: qty 5

## 2022-04-06 MED ORDER — HEPARIN SOD (PORK) LOCK FLUSH 100 UNIT/ML IV SOLN
500.0000 [IU] | Freq: Once | INTRAVENOUS | Status: AC | PRN
  Administered 2022-04-06: 500 [IU]

## 2022-04-06 MED ORDER — OSMOLITE 1.5 CAL PO LIQD: ORAL | 0 refills | Status: DC

## 2022-04-06 MED ORDER — SODIUM CHLORIDE 0.9 % IV SOLN
39.5000 mg/m2 | Freq: Once | INTRAVENOUS | Status: AC
  Administered 2022-04-06: 100 mg via INTRAVENOUS
  Filled 2022-04-06: qty 100

## 2022-04-06 MED ORDER — SODIUM CHLORIDE 0.9 % IV SOLN
10.0000 mg | Freq: Once | INTRAVENOUS | Status: AC
  Administered 2022-04-06: 10 mg via INTRAVENOUS
  Filled 2022-04-06: qty 10

## 2022-04-06 MED ORDER — MAGNESIUM SULFATE 2 GM/50ML IV SOLN: 2.0000 g | Freq: Once | INTRAVENOUS | Status: DC

## 2022-04-06 MED ORDER — SODIUM CHLORIDE 0.9 % IV SOLN: Freq: Once | INTRAVENOUS | Status: AC

## 2022-04-06 MED ORDER — PROSOURCE TF20 ENFIT COMPATIBL EN LIQD: 60.0000 mL | Freq: Every day | ENTERAL | Status: DC

## 2022-04-06 MED ORDER — POTASSIUM CHLORIDE IN NACL 20-0.9 MEQ/L-% IV SOLN
Freq: Once | INTRAVENOUS | Status: AC
  Filled 2022-04-06: qty 1000

## 2022-04-06 MED ORDER — SODIUM CHLORIDE 0.9 % IV SOLN
150.0000 mg | Freq: Once | INTRAVENOUS | Status: AC
  Administered 2022-04-06: 150 mg via INTRAVENOUS
  Filled 2022-04-06: qty 150

## 2022-04-06 NOTE — Progress Notes (Signed)
Nutrition Follow-up:  Patient with stage II SCC of oropharynx. He is currently concurrent chemoradiation with weekly cisplatin (first treatment 11/1). S/p G-tube placement 10/31.      Met with patient in infusion. He reports no longer able to eat solid foods. States he has been on liquids for the last 3 days. His throat is sore. Patient reports excess saliva. It is like he constantly has a puddle in his mouth at all times. Food taste horrible. Patient endorses nausea only when attempting to eat. He is able to get down chocolate flavor protein drink. The taste of water is improving and trying to drink more of this. Patient is motivated to remain active and continues going to the gym a few times a week. He is agreeable to start using feeding tube. Patient tolerated one carton Osmolite 1.5 with 60 ml water flush before and after during infusion today. During bolus education, noted short tube without clamp. Patient states he has to lay back for wife to complete water flush to keep gastric fluid from flowing out of tube when opening cap. Will order tube extension.   Medications: reviewed   Labs: Mg 1.5, BUN 27  Anthropometrics: Weights continue to trend down. Pt 262 lb 14.4 oz on 11/28  11/21 - 269 lb 9.6 oz 11/14 - 273 lb 3.2 oz  11/7 - 276 lb 4.8 oz  11/1 - 284 lb 4 oz   -7 lbs in one week - 2.6% severe -22 lbs in the last 27 days - 7.8% severe  New Estimated Energy Needs (d/t wt loss)  Kcals: [id number] Protein: 129-145 Fluid: >/= 2.4 L  Osmolite 1.5/equivalent - Give 7 cartons (1670m) split over four feedings/day. Flush tube with 90 ml water before and after each feeding. Drink by mouth or give via tube additional 474 ml (2 cups) water.  Prosource TF20 - 60 ml once daily via tube to meet protein needs Regimen provides 2565 kcal, 124 g protein, 1267 ml free water (2461 ml total water) Meets 100% RDIs  NUTRITION DIAGNOSIS: Inadequate oral intake continues    INTERVENTION:  Patient  completed bolus feeding with one carton Osmolite 1.5 complete with 120 ml water flush. He tolerated well. Teach back method used Patient will start with one carton Osmolite 1.5  QID and increase to goal as tolerated  One complimentary case of Osmolite 1.5 provided today Encouraged oral intake as tolerated Continue baking soda salt water rinses as well as ginger ale/sprite gargle for thick saliva   MONITORING, EVALUATION, GOAL: weight trends, intake, tube feeding   NEXT VISIT: Wednesday December 6 during infusion

## 2022-04-06 NOTE — Patient Instructions (Signed)
[facility]  Discharge Instructions: Thank you for choosing [facility] to provide your oncology and hematology care.   If you have a lab appointment with the [facility], please go directly to the [facility] and check in at the registration area.   Wear comfortable clothing and clothing appropriate for easy access to any Portacath or PICC line.   We strive to give you quality time with your provider. You may need to reschedule your appointment if you arrive late (15 or more minutes).  Arriving late affects you and other patients whose appointments are after yours.  Also, if you miss three or more appointments without notifying the office, you may be dismissed from the clinic at the provider's discretion.      For prescription refill requests, have your pharmacy contact our office and allow 72 hours for refills to be completed.    Today you received the following chemotherapy and/or immunotherapy agents: cisplatin      To help prevent nausea and vomiting after your treatment, we encourage you to take your nausea medication as directed.  BELOW ARE SYMPTOMS THAT SHOULD BE REPORTED IMMEDIATELY: *FEVER GREATER THAN 100.4 F (38 C) OR HIGHER *CHILLS OR SWEATING *NAUSEA AND VOMITING THAT IS NOT CONTROLLED WITH YOUR NAUSEA MEDICATION *UNUSUAL SHORTNESS OF BREATH *UNUSUAL BRUISING OR BLEEDING *URINARY PROBLEMS (pain or burning when urinating, or frequent urination) *BOWEL PROBLEMS (unusual diarrhea, constipation, pain near the anus) TENDERNESS IN MOUTH AND THROAT WITH OR WITHOUT PRESENCE OF ULCERS (sore throat, sores in mouth, or a toothache) UNUSUAL RASH, SWELLING OR PAIN  UNUSUAL VAGINAL DISCHARGE OR ITCHING   Items with * indicate a potential emergency and should be followed up as soon as possible or go to the Emergency Department if any problems should occur.  Please show the CHEMOTHERAPY ALERT CARD or IMMUNOTHERAPY ALERT CARD at check-in to  the Emergency Department and triage nurse.  Should you have questions after your visit or need to cancel or reschedule your appointment, please contact [facility]  Dept: [phone]  and follow the prompts.  Office hours are 8:00 a.m. to 4:30 p.m. Monday - Friday. Please note that voicemails left after 4:00 p.m. may not be returned until the following business day.  We are closed weekends and major holidays. You have access to a nurse at all times for urgent questions. Please call the main number to the clinic Dept: [phone] and follow the prompts.   For any non-urgent questions, you may also contact your provider using MyChart. We now offer e-Visits for anyone 73 and older to request care online for non-urgent symptoms. For details visit [email or web address].   Also download the MyChart app! Go to the app store, search "MyChart", open the app, select [facility], and log in with your MyChart username and password.  Masks are optional in the cancer centers. If you would like for your care team to wear a mask while they are taking care of you, please let them know. You may have one support person who is at least 76 years old accompany you for your appointments.

## 2022-04-06 NOTE — Progress Notes (Signed)
Per Dr Chryl Heck, ok to proceed with treatment today with Magnesium level of 1.5. Order given for 4g of Magnesium instead of 2g.

## 2022-04-07 ENCOUNTER — Other Ambulatory Visit: Payer: Self-pay

## 2022-04-07 ENCOUNTER — Ambulatory Visit
Admission: RE | Admit: 2022-04-07 | Discharge: 2022-04-07 | Disposition: A | Discharge status: Home / Self Care | Payer: PPO | Source: Ambulatory Visit | Attending: Radiation Oncology | Admitting: Radiation Oncology

## 2022-04-07 DIAGNOSIS — C01 Malignant neoplasm of base of tongue: Secondary | ICD-10-CM | POA: Diagnosis not present

## 2022-04-07 DIAGNOSIS — Z5111 Encounter for antineoplastic chemotherapy: Secondary | ICD-10-CM | POA: Diagnosis not present

## 2022-04-07 DIAGNOSIS — Z510 Encounter for antineoplastic radiation therapy: Secondary | ICD-10-CM | POA: Diagnosis not present

## 2022-04-07 DIAGNOSIS — C109 Malignant neoplasm of oropharynx, unspecified: Secondary | ICD-10-CM | POA: Diagnosis not present

## 2022-04-07 LAB — RAD ONC ARIA SESSION SUMMARY
Course Elapsed Days: 29
Plan Fractions Treated to Date: 21
Plan Prescribed Dose Per Fraction: 2 Gy
Plan Total Fractions Prescribed: 35
Plan Total Prescribed Dose: 70 Gy
Reference Point Dosage Given to Date: 42 Gy
Reference Point Session Dosage Given: 2 Gy
Session Number: 21

## 2022-04-08 ENCOUNTER — Other Ambulatory Visit: Payer: Self-pay

## 2022-04-08 ENCOUNTER — Ambulatory Visit
Admission: RE | Admit: 2022-04-08 | Discharge: 2022-04-08 | Disposition: A | Discharge status: Home / Self Care | Payer: PPO | Source: Ambulatory Visit | Attending: Radiation Oncology | Admitting: Radiation Oncology

## 2022-04-08 DIAGNOSIS — C109 Malignant neoplasm of oropharynx, unspecified: Secondary | ICD-10-CM | POA: Diagnosis not present

## 2022-04-08 DIAGNOSIS — Z87891 Personal history of nicotine dependence: Secondary | ICD-10-CM | POA: Insufficient documentation

## 2022-04-08 DIAGNOSIS — Z79899 Other long term (current) drug therapy: Secondary | ICD-10-CM | POA: Insufficient documentation

## 2022-04-08 DIAGNOSIS — Z5111 Encounter for antineoplastic chemotherapy: Secondary | ICD-10-CM | POA: Diagnosis not present

## 2022-04-08 DIAGNOSIS — C01 Malignant neoplasm of base of tongue: Secondary | ICD-10-CM | POA: Insufficient documentation

## 2022-04-08 DIAGNOSIS — I959 Hypotension, unspecified: Secondary | ICD-10-CM | POA: Insufficient documentation

## 2022-04-08 DIAGNOSIS — Z510 Encounter for antineoplastic radiation therapy: Secondary | ICD-10-CM | POA: Insufficient documentation

## 2022-04-08 LAB — RAD ONC ARIA SESSION SUMMARY
Course Elapsed Days: 30
Plan Fractions Treated to Date: 22
Plan Prescribed Dose Per Fraction: 2 Gy
Plan Total Fractions Prescribed: 35
Plan Total Prescribed Dose: 70 Gy
Reference Point Dosage Given to Date: 44 Gy
Reference Point Session Dosage Given: 2 Gy
Session Number: 22

## 2022-04-11 ENCOUNTER — Ambulatory Visit
Admission: RE | Admit: 2022-04-11 | Discharge: 2022-04-11 | Disposition: A | Discharge status: Home / Self Care | Payer: PPO | Source: Ambulatory Visit | Attending: Radiation Oncology | Admitting: Radiation Oncology

## 2022-04-11 ENCOUNTER — Other Ambulatory Visit: Payer: Self-pay

## 2022-04-11 DIAGNOSIS — C109 Malignant neoplasm of oropharynx, unspecified: Secondary | ICD-10-CM | POA: Diagnosis not present

## 2022-04-11 DIAGNOSIS — C01 Malignant neoplasm of base of tongue: Secondary | ICD-10-CM | POA: Diagnosis not present

## 2022-04-11 DIAGNOSIS — Z5111 Encounter for antineoplastic chemotherapy: Secondary | ICD-10-CM | POA: Diagnosis not present

## 2022-04-11 DIAGNOSIS — Z510 Encounter for antineoplastic radiation therapy: Secondary | ICD-10-CM | POA: Diagnosis not present

## 2022-04-11 LAB — RAD ONC ARIA SESSION SUMMARY
Course Elapsed Days: 33
Plan Fractions Treated to Date: 23
Plan Prescribed Dose Per Fraction: 2 Gy
Plan Total Fractions Prescribed: 35
Plan Total Prescribed Dose: 70 Gy
Reference Point Dosage Given to Date: 46 Gy
Reference Point Session Dosage Given: 2 Gy
Session Number: 23

## 2022-04-12 ENCOUNTER — Ambulatory Visit
Admission: RE | Admit: 2022-04-12 | Discharge: 2022-04-12 | Disposition: A | Discharge status: Home / Self Care | Payer: PPO | Source: Ambulatory Visit | Attending: Radiation Oncology | Admitting: Radiation Oncology

## 2022-04-12 ENCOUNTER — Inpatient Hospital Stay: Payer: PPO | Attending: Hematology and Oncology

## 2022-04-12 ENCOUNTER — Inpatient Hospital Stay (HOSPITAL_BASED_OUTPATIENT_CLINIC_OR_DEPARTMENT_OTHER): Payer: PPO | Admitting: Hematology and Oncology

## 2022-04-12 ENCOUNTER — Other Ambulatory Visit: Payer: Self-pay

## 2022-04-12 VITALS — BP 101/64 | HR 74 | Temp 97.9°F | Resp 16 | Ht 72.0 in | Wt 262.0 lb

## 2022-04-12 DIAGNOSIS — Z5111 Encounter for antineoplastic chemotherapy: Secondary | ICD-10-CM | POA: Insufficient documentation

## 2022-04-12 DIAGNOSIS — C109 Malignant neoplasm of oropharynx, unspecified: Secondary | ICD-10-CM | POA: Insufficient documentation

## 2022-04-12 DIAGNOSIS — Z87891 Personal history of nicotine dependence: Secondary | ICD-10-CM | POA: Insufficient documentation

## 2022-04-12 DIAGNOSIS — C01 Malignant neoplasm of base of tongue: Secondary | ICD-10-CM

## 2022-04-12 DIAGNOSIS — I959 Hypotension, unspecified: Secondary | ICD-10-CM | POA: Insufficient documentation

## 2022-04-12 DIAGNOSIS — Z510 Encounter for antineoplastic radiation therapy: Secondary | ICD-10-CM | POA: Diagnosis not present

## 2022-04-12 DIAGNOSIS — Z79899 Other long term (current) drug therapy: Secondary | ICD-10-CM | POA: Insufficient documentation

## 2022-04-12 DIAGNOSIS — Z95828 Presence of other vascular implants and grafts: Secondary | ICD-10-CM

## 2022-04-12 LAB — MAGNESIUM: Magnesium: 1.2 mg/dL — ABNORMAL LOW (ref 1.7–2.4)

## 2022-04-12 LAB — CBC WITH DIFFERENTIAL (CANCER CENTER ONLY)
Abs Immature Granulocytes: 0 10*3/uL (ref 0.00–0.07)
Basophils Absolute: 0 10*3/uL (ref 0.0–0.1)
Basophils Relative: 1 %
Eosinophils Absolute: 0 10*3/uL (ref 0.0–0.5)
Eosinophils Relative: 0 %
HCT: 32.4 % — ABNORMAL LOW (ref 39.0–52.0)
Hemoglobin: 11.4 g/dL — ABNORMAL LOW (ref 13.0–17.0)
Immature Granulocytes: 0 %
Lymphocytes Relative: 14 %
Lymphs Abs: 0.3 10*3/uL — ABNORMAL LOW (ref 0.7–4.0)
MCH: 32.1 pg (ref 26.0–34.0)
MCHC: 35.2 g/dL (ref 30.0–36.0)
MCV: 91.3 fL (ref 80.0–100.0)
Monocytes Absolute: 0.3 10*3/uL (ref 0.1–1.0)
Monocytes Relative: 12 %
Neutro Abs: 1.7 10*3/uL (ref 1.7–7.7)
Neutrophils Relative %: 73 %
Platelet Count: 108 10*3/uL — ABNORMAL LOW (ref 150–400)
RBC: 3.55 MIL/uL — ABNORMAL LOW (ref 4.22–5.81)
RDW: 13.3 % (ref 11.5–15.5)
WBC Count: 2.4 10*3/uL — ABNORMAL LOW (ref 4.0–10.5)
nRBC: 0 % (ref 0.0–0.2)

## 2022-04-12 LAB — BASIC METABOLIC PANEL - CANCER CENTER ONLY
Anion gap: 4 — ABNORMAL LOW (ref 5–15)
BUN: 24 mg/dL — ABNORMAL HIGH (ref 8–23)
CO2: 32 mmol/L (ref 22–32)
Calcium: 8.9 mg/dL (ref 8.9–10.3)
Chloride: 98 mmol/L (ref 98–111)
Creatinine: 0.87 mg/dL (ref 0.61–1.24)
GFR, Estimated: >60 mL/min (ref >60)
Glucose, Bld: 102 mg/dL — ABNORMAL HIGH (ref 70–99)
Potassium: 4.8 mmol/L (ref 3.5–5.1)
Sodium: 134 mmol/L — ABNORMAL LOW (ref 135–145)

## 2022-04-12 LAB — RAD ONC ARIA SESSION SUMMARY
Course Elapsed Days: 34
Plan Fractions Treated to Date: 24
Plan Prescribed Dose Per Fraction: 2 Gy
Plan Total Fractions Prescribed: 35
Plan Total Prescribed Dose: 70 Gy
Reference Point Dosage Given to Date: 48 Gy
Reference Point Session Dosage Given: 2 Gy
Session Number: 24

## 2022-04-12 MED ORDER — SODIUM CHLORIDE 0.9% FLUSH
10.0000 mL | INTRAVENOUS | Status: AC | PRN
  Administered 2022-04-12: 10 mL

## 2022-04-12 MED ORDER — HEPARIN SOD (PORK) LOCK FLUSH 100 UNIT/ML IV SOLN
500.0000 [IU] | INTRAVENOUS | Status: AC | PRN
  Administered 2022-04-12: 500 [IU]

## 2022-04-12 MED FILL — Dexamethasone Sodium Phosphate Inj 100 MG/10ML: INTRAMUSCULAR | Qty: 1 | Status: AC

## 2022-04-12 MED FILL — Fosaprepitant Dimeglumine For IV Infusion 150 MG (Base Eq): INTRAVENOUS | Qty: 5 | Status: AC

## 2022-04-12 NOTE — Progress Notes (Signed)
[facility] CONSULT NOTE  Patient Care Team: Emilio Aspen, MD as PCP - General (Internal Medicine) Quintella Reichert, MD as PCP - Cardiology (Cardiology) Regan Lemming, MD as PCP - Electrophysiology (Cardiology) Malmfelt, Lise Auer, RN as Oncology Nurse Navigator Rachel Moulds, MD as Consulting Physician (Hematology and Oncology) Lonie Peak, MD as Consulting Physician (Radiation Oncology)  CHIEF COMPLAINTS/PURPOSE OF CONSULTATION:  Squamous cell carcinoma of the oropharynx  ASSESSMENT & PLAN:  Squamous cell carcinoma of oropharynx Clarence Andrade) This is a very pleasant 76 year old male patient with 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  Patient arrived to the appointment today with his wife.  He tells me that he was in good shape although he had cardiac disease, may have had a heart attack many years ago, currently has a pacemaker in place.   PET/CT with no evidence of distant metastatic disease.  He is on concurrent weekly cisplatin with radiation at 40 mg/m.  He so far has finished 5 weekly cycles and has tolerated it really well overall.  Adverse effects today include fatigue grade 1 to grade 2.  He has been taking dexamethasone for 3 days however he starts on day 3 after chemo.  He found this to be a better regimen since he has felt good after chemo from the dexamethasone that is given.  When he stops taking the dexamethasone he feels really awful.  He also noticed low blood pressure in the mornings.  He is currently on metoprolol 12.5 and isosorbide 30 mg daily.  I have asked him to hold his metoprolol at night 77/44.  I will also send a message to Dr. Barbaraann Faster.  He apparently has not seen her in 3 years as he will be following up with her soon. He will return to clinic in 1 week or sooner as needed.  Hypomagnesemia, will replace 4 g of  magnesium  No orders of the defined types were placed in this encounter.  HISTORY OF PRESENTING ILLNESS:   Clarence Andrade 77 y.o. male is here because of SCC oropharynx.  Oncologic History  This is a very pleasant 76 year old male patient who was seen by ENT with chief complaint of lump in his neck ongoing for the past 1 to 2 months.  Around July he suddenly noticed the lump in the neck.  He was initially treated by antibiotics but this has not changed.  He also has started noticing some difficulty swallowing and some dysgeusia.  He tells me that he has lost about 20 pounds of weight in the past 2 to 3 months but part of it could be intentional.  He was seen by Dr. Conard Novak on September 20 for an initial consultation and he had flexible fiberoptic endoscopy the same day which noticed large base of tongue tumor.  This tumor measured at least 2 to 3 cm in diameter extending across midline.  He also had a CT on September 27 which confirmed 4 cm mainly exophytic mass centered at the left base of the tongue with at least 1 ipsilateral malignant node measuring nearly 4 cm with signs of extracapsular extension.  Findings consistent with metastatic squamous cell carcinoma.  PET/CT done on October 13 with no findings of metastatic disease, large left-sided hypermetabolic oropharyngeal neoplasm and adjacent left-sided metastatic lymphadenopathy.  Interval history  Patient is here for follow-up after fifth cycle of cisplatin.   He  is here with his wife.  He says he feels very tired in the mornings and usually picks up his energy by later afternoon.  He besides fatigue denies any unusual pain.  He is still able to eat and has been using the G-tube as well.  He denies any hearing issues, neuropathy.  He overall feels really well from what he is going through.  REVIEW OF SYSTEMS:   Constitutional: Denies fevers, chills or abnormal night sweats Eyes: Denies blurriness of vision, double vision or watery  eyes Ears, nose, mouth, throat, and face: Denies mucositis or sore throat Respiratory: Denies cough, dyspnea or wheezes Cardiovascular: Denies palpitation, chest discomfort or lower extremity swelling Gastrointestinal:  Denies nausea, heartburn or change in bowel habits Skin: Denies abnormal skin rashes Lymphatics: Denies new lymphadenopathy or easy bruising Neurological:Denies numbness, tingling or new weaknesses Behavioral/Psych: Mood is stable, no new changes  All other systems were reviewed with the patient and are negative.  MEDICAL HISTORY:  Past Medical History:  Diagnosis Date   Abdominal hernia    Midline   Adrenal incidentaloma (HCC) 2010   Aortic stenosis    Ascending aortic aneurysm (HCC)    a. 4.4cm by imaging 03/2020.   CAD (coronary artery disease)    a. Cath 03/2018 - CTO of the prox-mid LAD with bridging, L-L and R-L collaterals, moderate, non-obstructive disease involving the proximal LAD, proximal LCx, and distal RCA, more severe disease noted involving small branch vessels (RV marginal and superior branch of OM3).   Complete heart block (HCC)    Gallstones    Heart murmur    History of pacemaker    a. 11/2018 - [employer], intermittent CHB.   Hyperlipidemia    Morbid obesity (HCC)    Myocardial infarction [facility] Andrade)    NSVT (nonsustained ventricular tachycardia) (HCC)    OSA (obstructive sleep apnea)    Severe PSG 1/07 AHI 66/hr, O2 Nadir 50% Refuses treatment - Pt does not believe test was accurate   Pericarditis    Pneumonia    Pulmonary HTN (HCC)    mild with PASP by echo 02/2018, not seen on 03/2020 echo   Pulmonary nodule    Screening for AAA (abdominal aortic aneurysm) 2010   CT Abd/Pelvis   Ventral hernia    03/2009    SURGICAL HISTORY: Past Surgical History:  Procedure Laterality Date   CHOLECYSTECTOMY N/A 10/11/2019   Procedure: LAPAROSCOPIC CHOLECYSTECTOMY WITH INTRAOPERATIVE CHOLANGIOGRAM;  Surgeon: Darnell Level, MD;  Location: [facility] ORS;   Service: General;  Laterality: N/A;   CORONARY ANGIOGRAPHY N/A 03/23/2018   Procedure: CORONARY ANGIOGRAPHY;  Surgeon: Yvonne Kendall, MD;  Location: [facility] INVASIVE CV LAB;  Service: Cardiovascular;  Laterality: N/A;   HERNIA REPAIR  05/2002   three   IR GASTROSTOMY TUBE MOD SED  03/08/2022   IR IMAGING GUIDED PORT INSERTION  03/08/2022   KNEE ARTHROSCOPY Left 09/2002   PACEMAKER IMPLANT N/A 11/08/2018   Procedure: PACEMAKER IMPLANT;  Surgeon: Regan Lemming, MD;  Location: [facility] INVASIVE CV LAB;  Service: Cardiovascular;  Laterality: N/A;   SHOULDER SURGERY Right 2020   TOTAL KNEE ARTHROPLASTY Bilateral 09/2010   VENTRAL HERNIA REPAIR  03/2009    SOCIAL HISTORY: Social History   Socioeconomic History   Marital status: Married    Spouse name: Not on file   Number of children: Not on file   Years of education: Not on file   Highest education level: Not on file  Occupational History   Not on file  Tobacco Use   Smoking status: Former    Packs/day: 0.50    Types: Cigarettes    Quit date: 1993    Years since quitting: 30.9   Smokeless tobacco: Never  Vaping Use   Vaping Use: Never used  Substance and Sexual Activity   Alcohol use: No   Drug use: No   Sexual activity: Not Currently  Other Topics Concern   Not on file  Social History Narrative   Not on file   Social Determinants of Health   Financial Resource Strain: Low Risk  (02/24/2022)   Overall Financial Resource Strain (CARDIA)    Difficulty of Paying Living Expenses: Not hard at all  Food Insecurity: No Food Insecurity (02/24/2022)   Hunger Vital Sign    Worried About Running Out of Food in the Last Year: Never true    Ran Out of Food in the Last Year: Never true  Transportation Needs: No Transportation Needs (02/24/2022)   PRAPARE - Administrator, Civil Service (Medical): No    Lack of Transportation (Non-Medical): No  Physical Activity: Not on file  Stress: Not on file  Social Connections:  Not on file  Intimate Partner Violence: Not on file    FAMILY HISTORY: Family History  Problem Relation Age of Onset   Heart attack Father    Heart disease Father    Aneurysm Father    Breast cancer Sister    Colon cancer Sister     ALLERGIES:  is allergic to sertraline hcl.  MEDICATIONS:  Current Outpatient Medications  Medication Sig Dispense Refill   acetaminophen (TYLENOL) 500 MG tablet Take 1,000 mg by mouth every 6 (six) hours as needed (pain).     aspirin EC 81 MG tablet Take 81 mg by mouth daily.     buPROPion (WELLBUTRIN XL) 150 MG 24 hr tablet Take 150 mg by mouth every morning.     COVID-19 mRNA bivalent vaccine, Pfizer, (PFIZER COVID-19 VAC BIVALENT) injection Inject into the muscle. 0.3 mL 0   COVID-19 mRNA Vac-TriS, Pfizer, (PFIZER-BIONT COVID-19 VAC-TRIS) SUSP injection Inject into the muscle. 0.3 mL 0   COVID-19 mRNA vaccine 2023-2024 (COMIRNATY) syringe Inject into the muscle. 0.3 mL 0   dexamethasone (DECADRON) 4 MG tablet Take 2 tablets daily x 3 days starting the day after cisplatin chemotherapy. Take with food. 30 tablet 1   influenza vaccine adjuvanted (FLUAD) 0.5 ML injection Inject into the muscle. 0.5 mL 0   isosorbide mononitrate (IMDUR) 30 MG 24 hr tablet TAKE 1 TABLET ONCE DAILY. 90 tablet 0   lidocaine (XYLOCAINE) 2 % solution Patient: Mix 1part 2% viscous lidocaine, 1part H20. Swish & swallow 10mL of diluted mixture, before meals and at bedtime, up to QID 200 mL 3   lidocaine-prilocaine (EMLA) cream Apply to affected area once 30 g 3   metoprolol succinate (TOPROL-XL) 25 MG 24 hr tablet Take 0.5 tablets (12.5 mg total) by mouth daily. 90 tablet 3   Multiple Vitamin (MULTIVITAMIN) tablet Take 1 tablet by mouth daily.     nitroGLYCERIN (NITROSTAT) 0.4 MG SL tablet Place 1 tablet (0.4 mg total) under the tongue every 5 (five) minutes as needed for chest pain. (Patient not taking: Reported on 03/29/2022) 25 tablet 3   Nutritional Supplements (FEEDING  SUPPLEMENT, OSMOLITE 1.5 CAL,) LIQD 7 cartons Osmolite 1.5/equivalent split over four feedings. Flush tube with 90 ml water before and after each feeding. Drink by mouth or give via tube additional 474 ml (2 c.)  water. Provides 1659 ml/day 2485 kcal, 104 g protein, 1267 ml free water (2461 ml total water) Meets 100% RDI 1659 mL 0   ondansetron (ZOFRAN) 8 MG tablet Take 1 tablet (8 mg total) by mouth every 8 (eight) hours as needed for nausea or vomiting. Start on the third day after cisplatin. (Patient not taking: Reported on 03/29/2022) 30 tablet 1   prochlorperazine (COMPAZINE) 10 MG tablet Take 1 tablet (10 mg total) by mouth every 6 (six) hours as needed (Nausea or vomiting). (Patient not taking: Reported on 03/29/2022) 30 tablet 1   Protein (FEEDING SUPPLEMENT, PROSOURCE TF20,) liquid Place 60 mLs into feeding tube daily.     rosuvastatin (CRESTOR) 10 MG tablet TAKE 1 TABLET ONCE DAILY. 90 tablet 0   RSV vaccine recomb adjuvanted (AREXVY) 120 MCG/0.5ML injection Inject into the muscle. 0.5 mL 0   Sulfamethoxazole-Trimethoprim (SULFAMETHOXAZOLE-TMP DS PO) Take by mouth. (Patient not taking: Reported on 03/29/2022)     tamsulosin (FLOMAX) 0.4 MG CAPS capsule Take 0.4 mg by mouth daily.     temazepam (RESTORIL) 15 MG capsule Take 1 capsule (15 mg total) by mouth at bedtime as needed for sleep. 30 capsule 0   No current facility-administered medications for this visit.   Facility-Administered Medications Ordered in Other Visits  Medication Dose Route Frequency Provider Last Rate Last Admin   heparin lock flush 100 unit/mL  500 Units Intracatheter Once PRN Elis Sauber, MD       sodium chloride flush (NS) 0.9 % injection 10 mL  10 mL Intracatheter PRN Laritza Vokes, MD         PHYSICAL EXAMINATION: ECOG PERFORMANCE STATUS: 0 - Asymptomatic  Vitals:   04/12/22 1535  BP: 101/64  Pulse: 74  Resp: 16  Temp: 97.9 F (36.6 C)  SpO2: 98%   Filed Weights   04/12/22 1535  Weight: 262 lb  (118.8 kg)    Physical Exam Constitutional:      Appearance: Normal appearance.  HENT:     Mouth/Throat:     Mouth: Mucous membranes are moist.     Pharynx: Posterior oropharyngeal erythema (mild mucositis) present.  Cardiovascular:     Rate and Rhythm: Normal rate and regular rhythm.     Pulses: Normal pulses.     Heart sounds: Normal heart sounds.  Musculoskeletal:        General: Normal range of motion.     Cervical back: Normal range of motion and neck supple. No rigidity.  Lymphadenopathy:     Cervical: Cervical adenopathy (Cervical lymphadenopathy continues to improve, near resolution of adenopathy compared to his last) present.  Skin:    General: Skin is warm and dry.  Neurological:     Mental Status: He is alert.    LABORATORY DATA:  I have reviewed the data as listed Lab Results  Component Value Date   WBC 2.4 (L) 04/12/2022   HGB 11.4 (L) 04/12/2022   HCT 32.4 (L) 04/12/2022   MCV 91.3 04/12/2022   PLT 108 (L) 04/12/2022     Chemistry      Component Value Date/Time   NA 134 (L) 04/12/2022 1519   NA 140 10/13/2021 0954   K 4.8 04/12/2022 1519   CL 98 04/12/2022 1519   CO2 32 04/12/2022 1519   BUN 24 (H) 04/12/2022 1519   BUN 20 10/13/2021 0954   CREATININE 0.87 04/12/2022 1519      Component Value Date/Time   CALCIUM 8.9 04/12/2022 1519   ALKPHOS 73 03/30/2021 1119  AST 20 03/30/2021 1119   ALT 18 03/30/2021 1119   BILITOT 0.4 03/30/2021 1119       RADIOGRAPHIC STUDIES: I have personally reviewed the radiological images as listed and agreed with the findings in the report. No results found.  All questions were answered. The patient knows to call the clinic with any problems, questions or concerns. I spent 30 minutes in the care of this patient including H and P, review of records, counseling and coordination of care.     Rachel Moulds, MD 04/13/2022 2:40 PM

## 2022-04-13 ENCOUNTER — Inpatient Hospital Stay: Payer: PPO | Admitting: Dietician

## 2022-04-13 ENCOUNTER — Inpatient Hospital Stay: Payer: PPO

## 2022-04-13 ENCOUNTER — Ambulatory Visit
Admission: RE | Admit: 2022-04-13 | Discharge: 2022-04-13 | Disposition: A | Discharge status: Home / Self Care | Payer: PPO | Source: Ambulatory Visit | Attending: Radiation Oncology | Admitting: Radiation Oncology

## 2022-04-13 ENCOUNTER — Encounter: Payer: Self-pay | Admitting: Hematology and Oncology

## 2022-04-13 ENCOUNTER — Other Ambulatory Visit: Payer: Self-pay

## 2022-04-13 ENCOUNTER — Telehealth: Payer: Self-pay

## 2022-04-13 VITALS — BP 95/60 | HR 98 | Temp 98.5°F | Resp 16 | Ht 72.0 in | Wt 263.0 lb

## 2022-04-13 DIAGNOSIS — C01 Malignant neoplasm of base of tongue: Secondary | ICD-10-CM

## 2022-04-13 DIAGNOSIS — Z510 Encounter for antineoplastic radiation therapy: Secondary | ICD-10-CM | POA: Diagnosis not present

## 2022-04-13 DIAGNOSIS — Z5111 Encounter for antineoplastic chemotherapy: Secondary | ICD-10-CM | POA: Diagnosis not present

## 2022-04-13 DIAGNOSIS — C109 Malignant neoplasm of oropharynx, unspecified: Secondary | ICD-10-CM | POA: Diagnosis not present

## 2022-04-13 LAB — RAD ONC ARIA SESSION SUMMARY
Course Elapsed Days: 35
Plan Fractions Treated to Date: 25
Plan Prescribed Dose Per Fraction: 2 Gy
Plan Total Fractions Prescribed: 35
Plan Total Prescribed Dose: 70 Gy
Reference Point Dosage Given to Date: 50 Gy
Reference Point Session Dosage Given: 2 Gy
Session Number: 25

## 2022-04-13 MED ORDER — PALONOSETRON HCL INJECTION 0.25 MG/5ML
0.2500 mg | Freq: Once | INTRAVENOUS | Status: AC
  Administered 2022-04-13: 0.25 mg via INTRAVENOUS
  Filled 2022-04-13: qty 5

## 2022-04-13 MED ORDER — SODIUM CHLORIDE 0.9 % IV SOLN
150.0000 mg | Freq: Once | INTRAVENOUS | Status: AC
  Administered 2022-04-13: 150 mg via INTRAVENOUS
  Filled 2022-04-13: qty 150

## 2022-04-13 MED ORDER — SODIUM CHLORIDE 0.9 % IV SOLN: Freq: Once | INTRAVENOUS | Status: AC

## 2022-04-13 MED ORDER — SODIUM CHLORIDE 0.9 % IV SOLN
10.0000 mg | Freq: Once | INTRAVENOUS | Status: AC
  Administered 2022-04-13: 10 mg via INTRAVENOUS
  Filled 2022-04-13: qty 10

## 2022-04-13 MED ORDER — SODIUM CHLORIDE 0.9 % IV SOLN
39.5000 mg/m2 | Freq: Once | INTRAVENOUS | Status: AC
  Administered 2022-04-13: 100 mg via INTRAVENOUS
  Filled 2022-04-13: qty 100

## 2022-04-13 MED ORDER — SODIUM CHLORIDE 0.9% FLUSH: 10.0000 mL | INTRAVENOUS | Status: DC | PRN

## 2022-04-13 MED ORDER — POTASSIUM CHLORIDE IN NACL 20-0.9 MEQ/L-% IV SOLN
Freq: Once | INTRAVENOUS | Status: AC
  Filled 2022-04-13: qty 1000

## 2022-04-13 MED ORDER — HEPARIN SOD (PORK) LOCK FLUSH 100 UNIT/ML IV SOLN: 500.0000 [IU] | Freq: Once | INTRAVENOUS | Status: DC | PRN

## 2022-04-13 MED ORDER — MAGNESIUM SULFATE 4 GM/100ML IV SOLN
4.0000 g | Freq: Once | INTRAVENOUS | Status: AC
  Administered 2022-04-13: 4 g via INTRAVENOUS
  Filled 2022-04-13: qty 100

## 2022-04-13 NOTE — Patient Instructions (Signed)
[facility]  Discharge Instructions: Thank you for choosing [facility] to provide your oncology and hematology care.   If you have a lab appointment with the [facility], please go directly to the [facility] and check in at the registration area.   Wear comfortable clothing and clothing appropriate for easy access to any Portacath or PICC line.   We strive to give you quality time with your provider. You may need to reschedule your appointment if you arrive late (15 or more minutes).  Arriving late affects you and other patients whose appointments are after yours.  Also, if you miss three or more appointments without notifying the office, you may be dismissed from the clinic at the provider's discretion.      For prescription refill requests, have your pharmacy contact our office and allow 72 hours for refills to be completed.    Today you received the following chemotherapy and/or immunotherapy agents: cisplatin      To help prevent nausea and vomiting after your treatment, we encourage you to take your nausea medication as directed.  BELOW ARE SYMPTOMS THAT SHOULD BE REPORTED IMMEDIATELY: *FEVER GREATER THAN 100.4 F (38 C) OR HIGHER *CHILLS OR SWEATING *NAUSEA AND VOMITING THAT IS NOT CONTROLLED WITH YOUR NAUSEA MEDICATION *UNUSUAL SHORTNESS OF BREATH *UNUSUAL BRUISING OR BLEEDING *URINARY PROBLEMS (pain or burning when urinating, or frequent urination) *BOWEL PROBLEMS (unusual diarrhea, constipation, pain near the anus) TENDERNESS IN MOUTH AND THROAT WITH OR WITHOUT PRESENCE OF ULCERS (sore throat, sores in mouth, or a toothache) UNUSUAL RASH, SWELLING OR PAIN  UNUSUAL VAGINAL DISCHARGE OR ITCHING   Items with * indicate a potential emergency and should be followed up as soon as possible or go to the Emergency Department if any problems should occur.  Please show the CHEMOTHERAPY ALERT CARD or IMMUNOTHERAPY ALERT CARD at check-in to  the Emergency Department and triage nurse.  Should you have questions after your visit or need to cancel or reschedule your appointment, please contact [facility]  Dept: [phone]  and follow the prompts.  Office hours are 8:00 a.m. to 4:30 p.m. Monday - Friday. Please note that voicemails left after 4:00 p.m. may not be returned until the following business day.  We are closed weekends and major holidays. You have access to a nurse at all times for urgent questions. Please call the main number to the clinic Dept: [phone] and follow the prompts.   For any non-urgent questions, you may also contact your provider using MyChart. We now offer e-Visits for anyone 59 and older to request care online for non-urgent symptoms. For details visit [email or web address].   Also download the MyChart app! Go to the app store, search "MyChart", open the app, select [facility], and log in with your MyChart username and password.  Masks are optional in the cancer centers. If you would like for your care team to wear a mask while they are taking care of you, please let them know. You may have one support person who is at least 76 years old accompany you for your appointments.

## 2022-04-13 NOTE — Telephone Encounter (Signed)
Patient refused to see anyone until after Christmas, and he only wants to see Melina Copa PA or Dr. Radford Pax. Made patient an appointment with Melina Copa PA on 05/16/21.

## 2022-04-13 NOTE — Telephone Encounter (Signed)
-----   Message from Sueanne Margarita, MD sent at 04/12/2022 10:57 PM EST ----- I have not seen this patient in over 3 years.  Please get him in with an extender or DOD this week ----- Message ----- From: Benay Pike, MD Sent: 04/12/2022   3:52 PM EST To: Sueanne Margarita, MD  Dr Radford Pax,  Mr Greenup has been having very low BP in the mornings. His BP this morning was 77/44. He feels terribly weak, light headed and dizzy. Could you help [place] titrate his BP medication. He is only on 12.5 mg of metoprolol and isosorbide 30 mg once a day We appreciate your help very much  Thanks,

## 2022-04-13 NOTE — Assessment & Plan Note (Signed)
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  Patient arrived to the appointment today with his wife.  He tells me that he was in good shape although he had cardiac disease, may have had a heart attack many years ago, currently has a pacemaker in place.   PET/CT with no evidence of distant metastatic disease.  He is on concurrent weekly cisplatin with radiation at 40 mg/m.  He so far has finished 5 weekly cycles and has tolerated it really well overall.  Adverse effects today include fatigue grade 1 to grade 2.  He has been taking dexamethasone for 3 days however he starts on day 3 after chemo.  He found this to be a better regimen since he has felt good after chemo from the dexamethasone that is given.  When he stops taking the dexamethasone he feels really awful.  He also noticed low blood pressure in the mornings.  He is currently on metoprolol 12.5 and isosorbide 30 mg daily.  I have asked him to hold his metoprolol at night 77/44.  I will also send a message to Dr. Renee Rival.  He apparently has not seen her in 3 years as he will be following up with her soon. He will return to clinic in 1 week or sooner as needed.

## 2022-04-13 NOTE — Progress Notes (Signed)
Per Dr Chryl Heck, ok to proceed with Mag level 1.2. Magnesium replacement 4g IVPB ordered.

## 2022-04-13 NOTE — Progress Notes (Signed)
Nutrition Follow-up:  Patient with stage II SCC of oropharynx. He is currently concurrent chemoradiation with weekly cisplatin (first treatment 11/1). S/p G-tube placement 10/31.      Met with patient during infusion. He reports unable to eat orally the since the weekend. Food taste horrible. Patient has excessive saliva. States he wakes up in the night choking on it. He has nausea associated with increased saliva. Nausea is managed well with zofran. Patient reports declining enteral order from [employer]. Says it was too much stuff and he and wife are managing fine. Patient is giving 3 cartons of Osmolite 1.5 via tube. He is drinking 2-3 Equate Plus (350 kcal, 13g protein). Patient is flushing tube through out the day as well as giving ~8 ounces per feeding. He takes sips of water frequently to keep mouth from drying out.    Medications: reviewed   Labs: Mg 1.2, Na 134, BUN 24, glucose 102  Anthropometrics: Wt 263 lb on 12/5 stable x 1 week  11/28 - 262 lb 14.4 oz  11/21 - 269 lb 9.6 oz 11/14 - 273 lb 3.2 oz  11/7 - 276 lb 4.8 oz  11/1 - 284 lb 4 oz     Estimated Energy Needs  Kcals: 2400-2625 Protein: 129-145 Fluid: 2.4 L  NUTRITION DIAGNOSIS: Inadequate oral intake continues    INTERVENTION:  Reinforced importance of adequate calorie and protein energy intake to maintain strength, promote healing post treatment Patient set up with [employer] for delivery of enteral formula/supplies - pt discontinued order He has one + one half cases of Osmolite 1.5 that was provided to him Continue 3 cartons Osmolite 1.5 via tube. Drink by mouth or give via tube Ensure Complete/equivalent (350 kcal, 30 g protein) TID. Flush tube with 240 ml water 5 times daily.  This provides 1599 ml/day - 2115 kcal, 134.7 grams protein, 1083 ml free water (2283 ml total water with flushes) Samples of Ensure Complete provided    MONITORING, EVALUATION, GOAL: weight trends, intake   NEXT VISIT: Wednesday December 13  during infusion

## 2022-04-14 ENCOUNTER — Ambulatory Visit
Admission: RE | Admit: 2022-04-14 | Discharge: 2022-04-14 | Disposition: A | Discharge status: Home / Self Care | Payer: PPO | Source: Ambulatory Visit | Attending: Radiation Oncology | Admitting: Radiation Oncology

## 2022-04-14 ENCOUNTER — Other Ambulatory Visit: Payer: Self-pay

## 2022-04-14 DIAGNOSIS — R972 Elevated prostate specific antigen [PSA]: Secondary | ICD-10-CM | POA: Diagnosis not present

## 2022-04-14 DIAGNOSIS — C109 Malignant neoplasm of oropharynx, unspecified: Secondary | ICD-10-CM | POA: Diagnosis not present

## 2022-04-14 DIAGNOSIS — Z5111 Encounter for antineoplastic chemotherapy: Secondary | ICD-10-CM | POA: Diagnosis not present

## 2022-04-14 DIAGNOSIS — C01 Malignant neoplasm of base of tongue: Secondary | ICD-10-CM | POA: Diagnosis not present

## 2022-04-14 DIAGNOSIS — Z510 Encounter for antineoplastic radiation therapy: Secondary | ICD-10-CM | POA: Diagnosis not present

## 2022-04-14 LAB — RAD ONC ARIA SESSION SUMMARY
Course Elapsed Days: 36
Plan Fractions Treated to Date: 26
Plan Prescribed Dose Per Fraction: 2 Gy
Plan Total Fractions Prescribed: 35
Plan Total Prescribed Dose: 70 Gy
Reference Point Dosage Given to Date: 52 Gy
Reference Point Session Dosage Given: 2 Gy
Session Number: 26

## 2022-04-15 ENCOUNTER — Inpatient Hospital Stay: Payer: PPO | Admitting: Dietician

## 2022-04-15 ENCOUNTER — Other Ambulatory Visit: Payer: Self-pay

## 2022-04-15 ENCOUNTER — Ambulatory Visit (HOSPITAL_COMMUNITY)
Admission: RE | Admit: 2022-04-15 | Discharge: 2022-04-15 | Disposition: A | Discharge status: Home / Self Care | Payer: PPO | Source: Ambulatory Visit | Attending: Radiation Oncology | Admitting: Radiation Oncology

## 2022-04-15 ENCOUNTER — Other Ambulatory Visit: Payer: Self-pay | Admitting: Radiation Oncology

## 2022-04-15 ENCOUNTER — Ambulatory Visit
Admission: RE | Admit: 2022-04-15 | Discharge: 2022-04-15 | Disposition: A | Discharge status: Home / Self Care | Payer: PPO | Source: Ambulatory Visit | Attending: Radiation Oncology | Admitting: Radiation Oncology

## 2022-04-15 DIAGNOSIS — C01 Malignant neoplasm of base of tongue: Secondary | ICD-10-CM

## 2022-04-15 DIAGNOSIS — Z431 Encounter for attention to gastrostomy: Secondary | ICD-10-CM | POA: Diagnosis not present

## 2022-04-15 DIAGNOSIS — C109 Malignant neoplasm of oropharynx, unspecified: Secondary | ICD-10-CM | POA: Insufficient documentation

## 2022-04-15 DIAGNOSIS — Z510 Encounter for antineoplastic radiation therapy: Secondary | ICD-10-CM | POA: Diagnosis not present

## 2022-04-15 DIAGNOSIS — Z5111 Encounter for antineoplastic chemotherapy: Secondary | ICD-10-CM | POA: Diagnosis not present

## 2022-04-15 DIAGNOSIS — K9423 Gastrostomy malfunction: Secondary | ICD-10-CM | POA: Diagnosis not present

## 2022-04-15 HISTORY — PX: IR GJ TUBE CHANGE: IMG1440

## 2022-04-15 LAB — RAD ONC ARIA SESSION SUMMARY
Course Elapsed Days: 37
Plan Fractions Treated to Date: 27
Plan Prescribed Dose Per Fraction: 2 Gy
Plan Total Fractions Prescribed: 35
Plan Total Prescribed Dose: 70 Gy
Reference Point Dosage Given to Date: 54 Gy
Reference Point Session Dosage Given: 2 Gy
Session Number: 27

## 2022-04-15 MED ORDER — LIDOCAINE HCL 1 % IJ SOLN
INTRAMUSCULAR | Status: AC
  Filled 2022-04-15: qty 20

## 2022-04-15 MED ORDER — LIDOCAINE VISCOUS HCL 2 % MT SOLN
OROMUCOSAL | Status: AC
  Filled 2022-04-15: qty 15

## 2022-04-15 NOTE — Progress Notes (Signed)
Oncology Nurse Navigator Documentation   Mr. Dunwoody called me this morning to report that he could not flush his PEG. I instructed him to try to flush it using the plunger on his syringe and he called me back telling me that it was working well again. He was scheduled to see Vinnie Level RD today and I was asked by her to come over and look at his PEG because it had stopped working again. It appears to be the top part of the PEG where you put the syringe in for feedings that is sluggish. I contacted Interventional Radiology to see if they could assess it due to the upcoming weekend and Mr. Eckels not able to eat much food orally. They were happy to see him and I escorted him and his wife to IR for evaluation. I also notified the [facility] that Mr. Tanimoto would be late for his radiation appointment.  Harlow Asa RN, BSN, OCN Head & Neck Oncology Nurse [place] [facility] Phone # [phone]  Fax # [phone]

## 2022-04-15 NOTE — Progress Notes (Signed)
Brief nutrition follow-up completed with patient and wife in office. Patient reports feeding tube clogged overnight and notes missing cap from valve. Tube requires plunger to complete water flush. He has been unable to give bolus feeding today. Nurse navigator [facility]) examined tube recommending IR evaluation. IR agreeable to see patient this afternoon.   Provided samples of Boost VHC + one case Ensure Complete Encouraged to drink by mouth as tolerated while awaiting IR recommendations/plan - recommend 3 Boost + 1 Ensure Complete (1949 kcal, 96 g) Patient will resume tube feedings as able

## 2022-04-18 ENCOUNTER — Ambulatory Visit
Admission: RE | Admit: 2022-04-18 | Discharge: 2022-04-18 | Disposition: A | Discharge status: Home / Self Care | Payer: PPO | Source: Ambulatory Visit | Attending: Radiation Oncology | Admitting: Radiation Oncology

## 2022-04-18 ENCOUNTER — Other Ambulatory Visit: Payer: Self-pay

## 2022-04-18 ENCOUNTER — Other Ambulatory Visit: Payer: Self-pay | Admitting: Radiation Oncology

## 2022-04-18 DIAGNOSIS — C109 Malignant neoplasm of oropharynx, unspecified: Secondary | ICD-10-CM | POA: Diagnosis not present

## 2022-04-18 DIAGNOSIS — C01 Malignant neoplasm of base of tongue: Secondary | ICD-10-CM

## 2022-04-18 DIAGNOSIS — Z510 Encounter for antineoplastic radiation therapy: Secondary | ICD-10-CM | POA: Diagnosis not present

## 2022-04-18 DIAGNOSIS — Z5111 Encounter for antineoplastic chemotherapy: Secondary | ICD-10-CM | POA: Diagnosis not present

## 2022-04-18 LAB — RAD ONC ARIA SESSION SUMMARY
Course Elapsed Days: 40
Plan Fractions Treated to Date: 28
Plan Prescribed Dose Per Fraction: 2 Gy
Plan Total Fractions Prescribed: 35
Plan Total Prescribed Dose: 70 Gy
Reference Point Dosage Given to Date: 56 Gy
Reference Point Session Dosage Given: 2 Gy
Session Number: 28

## 2022-04-18 MED ORDER — TEMAZEPAM 15 MG PO CAPS: 15.0000 mg | ORAL_CAPSULE | Freq: Every evening | ORAL | 0 refills | Status: DC | PRN

## 2022-04-19 ENCOUNTER — Inpatient Hospital Stay (HOSPITAL_BASED_OUTPATIENT_CLINIC_OR_DEPARTMENT_OTHER): Payer: PPO | Admitting: Hematology and Oncology

## 2022-04-19 ENCOUNTER — Telehealth: Payer: Self-pay | Admitting: *Deleted

## 2022-04-19 ENCOUNTER — Other Ambulatory Visit: Payer: Self-pay

## 2022-04-19 ENCOUNTER — Inpatient Hospital Stay: Payer: PPO

## 2022-04-19 ENCOUNTER — Ambulatory Visit
Admission: RE | Admit: 2022-04-19 | Discharge: 2022-04-19 | Disposition: A | Discharge status: Home / Self Care | Payer: PPO | Source: Ambulatory Visit | Attending: Radiation Oncology | Admitting: Radiation Oncology

## 2022-04-19 ENCOUNTER — Other Ambulatory Visit: Payer: Self-pay | Admitting: *Deleted

## 2022-04-19 ENCOUNTER — Ambulatory Visit: Admission: RE | Admit: 2022-04-19 | Payer: PPO | Source: Ambulatory Visit

## 2022-04-19 VITALS — BP 99/61 | HR 81 | Temp 97.7°F | Resp 16 | Ht 72.0 in | Wt 259.3 lb

## 2022-04-19 DIAGNOSIS — Z510 Encounter for antineoplastic radiation therapy: Secondary | ICD-10-CM | POA: Diagnosis not present

## 2022-04-19 DIAGNOSIS — Z5111 Encounter for antineoplastic chemotherapy: Secondary | ICD-10-CM | POA: Diagnosis not present

## 2022-04-19 DIAGNOSIS — C109 Malignant neoplasm of oropharynx, unspecified: Secondary | ICD-10-CM | POA: Diagnosis not present

## 2022-04-19 DIAGNOSIS — C01 Malignant neoplasm of base of tongue: Secondary | ICD-10-CM

## 2022-04-19 DIAGNOSIS — Z95828 Presence of other vascular implants and grafts: Secondary | ICD-10-CM | POA: Insufficient documentation

## 2022-04-19 DIAGNOSIS — R790 Abnormal level of blood mineral: Secondary | ICD-10-CM

## 2022-04-19 DIAGNOSIS — C760 Malignant neoplasm of head, face and neck: Secondary | ICD-10-CM

## 2022-04-19 LAB — BASIC METABOLIC PANEL - CANCER CENTER ONLY
Anion gap: 6 (ref 5–15)
BUN: 25 mg/dL — ABNORMAL HIGH (ref 8–23)
CO2: 30 mmol/L (ref 22–32)
Calcium: 8.9 mg/dL (ref 8.9–10.3)
Chloride: 97 mmol/L — ABNORMAL LOW (ref 98–111)
Creatinine: 0.87 mg/dL (ref 0.61–1.24)
GFR, Estimated: >60 mL/min (ref >60)
Glucose, Bld: 126 mg/dL — ABNORMAL HIGH (ref 70–99)
Potassium: 4.8 mmol/L (ref 3.5–5.1)
Sodium: 133 mmol/L — ABNORMAL LOW (ref 135–145)

## 2022-04-19 LAB — CBC WITH DIFFERENTIAL (CANCER CENTER ONLY)
Abs Immature Granulocytes: 0.01 10*3/uL (ref 0.00–0.07)
Basophils Absolute: 0 10*3/uL (ref 0.0–0.1)
Basophils Relative: 1 %
Eosinophils Absolute: 0 10*3/uL (ref 0.0–0.5)
Eosinophils Relative: 1 %
HCT: 30.4 % — ABNORMAL LOW (ref 39.0–52.0)
Hemoglobin: 10.7 g/dL — ABNORMAL LOW (ref 13.0–17.0)
Immature Granulocytes: 1 %
Lymphocytes Relative: 12 %
Lymphs Abs: 0.2 10*3/uL — ABNORMAL LOW (ref 0.7–4.0)
MCH: 32 pg (ref 26.0–34.0)
MCHC: 35.2 g/dL (ref 30.0–36.0)
MCV: 91 fL (ref 80.0–100.0)
Monocytes Absolute: 0.4 10*3/uL (ref 0.1–1.0)
Monocytes Relative: 20 %
Neutro Abs: 1.3 10*3/uL — ABNORMAL LOW (ref 1.7–7.7)
Neutrophils Relative %: 65 %
Platelet Count: 139 10*3/uL — ABNORMAL LOW (ref 150–400)
RBC: 3.34 MIL/uL — ABNORMAL LOW (ref 4.22–5.81)
RDW: 13.8 % (ref 11.5–15.5)
WBC Count: 1.9 10*3/uL — ABNORMAL LOW (ref 4.0–10.5)
nRBC: 0 % (ref 0.0–0.2)

## 2022-04-19 LAB — RAD ONC ARIA SESSION SUMMARY
Course Elapsed Days: 41
Plan Fractions Treated to Date: 29
Plan Prescribed Dose Per Fraction: 2 Gy
Plan Total Fractions Prescribed: 35
Plan Total Prescribed Dose: 70 Gy
Reference Point Dosage Given to Date: 58 Gy
Reference Point Session Dosage Given: 2 Gy
Session Number: 29

## 2022-04-19 LAB — MAGNESIUM: Magnesium: 0.9 mg/dL — CL (ref 1.7–2.4)

## 2022-04-19 MED ORDER — SODIUM CHLORIDE 0.9% FLUSH
10.0000 mL | Freq: Once | INTRAVENOUS | Status: AC
  Administered 2022-04-19: 10 mL

## 2022-04-19 MED ORDER — HEPARIN SOD (PORK) LOCK FLUSH 100 UNIT/ML IV SOLN
500.0000 [IU] | Freq: Once | INTRAVENOUS | Status: AC
  Administered 2022-04-19: 500 [IU]

## 2022-04-19 MED FILL — Fosaprepitant Dimeglumine For IV Infusion 150 MG (Base Eq): INTRAVENOUS | Qty: 5 | Status: AC

## 2022-04-19 MED FILL — Dexamethasone Sodium Phosphate Inj 100 MG/10ML: INTRAMUSCULAR | Qty: 1 | Status: AC

## 2022-04-19 NOTE — Telephone Encounter (Signed)
CRITICAL VALUE STICKER  CRITICAL VALUE:Magnesium 0.9  RECEIVER (on-site recipient of call): Georgina Pillion, RN  DATE & TIME NOTIFIED: 04/19/22; 1519  MESSENGER (representative from lab):Anette Guarneri  MD NOTIFIED: Dr. Chryl Heck   TIME OF NOTIFICATION: 1530  RESPONSE: Verbal order per Dr. Chryl Heck - patient to receive 4 gm magnesium IV 04/20/22. Patient notified to come in for infusion at 0730 in AM.  Patient's chemotherapy originally scheduled for tomorrow previously cancelled by Dr. Chryl Heck. Patient verbalized understanding of all information and time of appt in AM.

## 2022-04-19 NOTE — Progress Notes (Signed)
[facility] NOTE  Patient Care Team: Kathalene Frames, MD as PCP - General (Internal Medicine) Sueanne Margarita, MD as PCP - Cardiology (Cardiology) Constance Haw, MD as PCP - Electrophysiology (Cardiology) Malmfelt, Stephani Police, RN as Oncology Nurse Navigator Benay Pike, MD as Consulting Physician (Hematology and Oncology) [name], MD as Consulting Physician (Radiation Oncology)  CHIEF COMPLAINTS/PURPOSE OF CONSULTATION:  Squamous cell carcinoma of the oropharynx  ASSESSMENT & PLAN:  No problem-specific Assessment & Plan notes found for this encounter.   No orders of the defined types were placed in this encounter.  HISTORY OF PRESENTING ILLNESS:   DEANDRE BRANNAN 76 y.o. male is here because of SCC oropharynx.  Oncologic History  This is a very pleasant 76 year old male patient who was seen by ENT with chief complaint of lump in his neck ongoing for the past 1 to 2 months.  Around July he suddenly noticed the lump in the neck.  He was initially treated by antibiotics but this has not changed.  He also has started noticing some difficulty swallowing and some dysgeusia.  He tells me that he has lost about 20 pounds of weight in the past 2 to 3 months but part of it could be intentional.  He was seen by Dr. Synetta Shadow on September 20 for an initial consultation and he had flexible fiberoptic endoscopy the same day which noticed large base of tongue tumor.  This tumor measured at least 2 to 3 cm in diameter extending across midline.  He also had a CT on September 27 which confirmed 4 cm mainly exophytic mass centered at the left base of the tongue with at least 1 ipsilateral malignant node measuring nearly 4 cm with signs of extracapsular extension.  Findings consistent with metastatic squamous cell carcinoma.  PET/CT done on October 13 with no findings of metastatic disease, large left-sided hypermetabolic oropharyngeal neoplasm and adjacent  left-sided metastatic lymphadenopathy.  Interval history  Patient is here for follow-up after fifth cycle of cisplatin.   He is here with his wife.  He says he feels very tired in the mornings and usually picks up his energy by later afternoon.  He besides fatigue denies any unusual pain.  He is still able to eat and has been using the G-tube as well.  He denies any hearing issues, neuropathy.  He overall feels really well from what he is going through.  REVIEW OF SYSTEMS:   Constitutional: Denies fevers, chills or abnormal night sweats Eyes: Denies blurriness of vision, double vision or watery eyes Ears, nose, mouth, throat, and face: Denies mucositis or sore throat Respiratory: Denies cough, dyspnea or wheezes Cardiovascular: Denies palpitation, chest discomfort or lower extremity swelling Gastrointestinal:  Denies nausea, heartburn or change in bowel habits Skin: Denies abnormal skin rashes Lymphatics: Denies new lymphadenopathy or easy bruising Neurological:Denies numbness, tingling or new weaknesses Behavioral/Psych: Mood is stable, no new changes  All other systems were reviewed with the patient and are negative.  MEDICAL HISTORY:  Past Medical History:  Diagnosis Date  . Abdominal hernia    Midline  . Adrenal incidentaloma ([place]) 2010  . Aortic stenosis   . Ascending aortic aneurysm ([place])    a. 4.4cm by imaging 03/2020.  Marland Kitchen CAD (coronary artery disease)    a. Cath 03/2018 - CTO of the prox-mid LAD with bridging, L-L and R-L collaterals, moderate, non-obstructive disease involving the proximal LAD, proximal LCx, and distal RCA, more severe disease noted involving small branch  vessels (RV marginal and superior branch of OM3).  . Complete heart block ([place])   . Gallstones   . Heart murmur   . History of pacemaker    a. 11/2018 - Medtronic, intermittent CHB.  Marland Kitchen Hyperlipidemia   . Morbid obesity ([place])   . Myocardial infarction ([place])   . NSVT (nonsustained ventricular  tachycardia) ([place])   . OSA (obstructive sleep apnea)    Severe PSG 1/07 AHI 66/hr, O2 Nadir 50% Refuses treatment - Pt does not believe test was accurate  . Pericarditis   . Pneumonia   . Pulmonary HTN ([place])    mild with PASP 31mHg by echo 02/2018, not seen on 03/2020 echo  . Pulmonary nodule   . Screening for AAA (abdominal aortic aneurysm) 2010   CT Abd/Pelvis  . Ventral hernia    03/2009    SURGICAL HISTORY: Past Surgical History:  Procedure Laterality Date  . CHOLECYSTECTOMY N/A 10/11/2019   Procedure: LAPAROSCOPIC CHOLECYSTECTOMY WITH INTRAOPERATIVE CHOLANGIOGRAM;  Surgeon: GArmandina Gemma MD;  Location: [facility];  Service: General;  Laterality: N/A;  . CORONARY ANGIOGRAPHY N/A 03/23/2018   Procedure: CORONARY ANGIOGRAPHY;  Surgeon: ENelva Bush MD;  [facility];  Service: Cardiovascular;  Laterality: N/A;  . HERNIA REPAIR  05/2002   three  . IR GASTROSTOMY TUBE MOD SED  03/08/2022  . IR GJ TUBE CHANGE  04/15/2022  . IR IMAGING GUIDED PORT INSERTION  03/08/2022  . KNEE ARTHROSCOPY Left 09/2002  . PACEMAKER IMPLANT N/A 11/08/2018   Procedure: PACEMAKER IMPLANT;  Surgeon: CConstance Haw MD;  [facility];  Service: Cardiovascular;  Laterality: N/A;  . SHOULDER SURGERY Right 2020  . TOTAL KNEE ARTHROPLASTY Bilateral 09/2010  . VENTRAL HERNIA REPAIR  03/2009    SOCIAL HISTORY: Social History   Socioeconomic History  . Marital status: Married    Spouse name: Not on file  . Number of children: Not on file  . Years of education: Not on file  . Highest education level: Not on file  Occupational History  . Not on file  Tobacco Use  . Smoking status: Former    Packs/day: 0.50    Types: Cigarettes    Quit date: 1993    Years since quitting: 30.9  . Smokeless tobacco: Never  Vaping Use  . Vaping Use: Never used  Substance and Sexual Activity  . Alcohol use: No  . Drug use: No  . Sexual activity: Not Currently  Other Topics  Concern  . Not on file  Social History Narrative  . Not on file   Social Determinants of Health   Financial Resource Strain: Low Risk  (02/24/2022)   Overall Financial Resource Strain (CARDIA)   . Difficulty of Paying Living Expenses: Not hard at all  Food Insecurity: No Food Insecurity (02/24/2022)   Hunger Vital Sign   . Worried About RCharity fundraiserin the Last Year: Never true   . Ran Out of Food in the Last Year: Never true  Transportation Needs: No Transportation Needs (02/24/2022)   PRAPARE - Transportation   . Lack of Transportation (Medical): No   . Lack of Transportation (Non-Medical): No  Physical Activity: Not on file  Stress: Not on file  Social Connections: Not on file  Intimate Partner Violence: Not on file    FAMILY HISTORY: Family History  Problem Relation Age of Onset  . Heart attack Father   . Heart disease Father   . Aneurysm Father   .  Breast cancer Sister   . Colon cancer Sister     ALLERGIES:  is allergic to sertraline hcl.  MEDICATIONS:  Current Outpatient Medications  Medication Sig Dispense Refill  . acetaminophen (TYLENOL) 500 MG tablet Take 1,000 mg by mouth every 6 (six) hours as needed (pain).    Marland Kitchen aspirin EC 81 MG tablet Take 81 mg by mouth daily.    Marland Kitchen buPROPion (WELLBUTRIN XL) 150 MG 24 hr tablet Take 150 mg by mouth every morning.    Marland Kitchen COVID-19 mRNA bivalent vaccine, Pfizer, (PFIZER COVID-19 VAC BIVALENT) injection Inject into the muscle. 0.3 mL 0  . COVID-19 mRNA Vac-TriS, Pfizer, (PFIZER-BIONT COVID-19 VAC-TRIS) SUSP injection Inject into the muscle. 0.3 mL 0  . COVID-19 mRNA vaccine 2023-2024 (COMIRNATY) syringe Inject into the muscle. 0.3 mL 0  . dexamethasone (DECADRON) 4 MG tablet Take 2 tablets daily x 3 days starting the day after cisplatin chemotherapy. Take with food. 30 tablet 1  . influenza vaccine adjuvanted (FLUAD) 0.5 ML injection Inject into the muscle. 0.5 mL 0  . isosorbide mononitrate (IMDUR) 30 MG 24 hr tablet  TAKE 1 TABLET ONCE DAILY. 90 tablet 0  . lidocaine (XYLOCAINE) 2 % solution Patient: Mix 1part 2% viscous lidocaine, 1part H20. Swish & swallow 29m of diluted mixture, 328m before meals and at bedtime, up to QID 200 mL 3  . lidocaine-prilocaine (EMLA) cream Apply to affected area once 30 g 3  . metoprolol succinate (TOPROL-XL) 25 MG 24 hr tablet Take 0.5 tablets (12.5 mg total) by mouth daily. 90 tablet 3  . Multiple Vitamin (MULTIVITAMIN) tablet Take 1 tablet by mouth daily.    . nitroGLYCERIN (NITROSTAT) 0.4 MG SL tablet Place 1 tablet (0.4 mg total) under the tongue every 5 (five) minutes as needed for chest pain. (Patient not taking: Reported on 03/29/2022) 25 tablet 3  . Nutritional Supplements (FEEDING SUPPLEMENT, OSMOLITE 1.5 CAL,) LIQD 7 cartons Osmolite 1.5/equivalent split over four feedings. Flush tube with 90 ml water before and after each feeding. Drink by mouth or give via tube additional 474 ml (2 c.) water. Provides 1659 ml/day 2485 kcal, 104 g protein, 1267 ml free water (2461 ml total water) Meets 100% RDI 1659 mL 0  . ondansetron (ZOFRAN) 8 MG tablet Take 1 tablet (8 mg total) by mouth every 8 (eight) hours as needed for nausea or vomiting. Start on the third day after cisplatin. (Patient not taking: Reported on 03/29/2022) 30 tablet 1  . prochlorperazine (COMPAZINE) 10 MG tablet Take 1 tablet (10 mg total) by mouth every 6 (six) hours as needed (Nausea or vomiting). (Patient not taking: Reported on 03/29/2022) 30 tablet 1  . Protein (FEEDING SUPPLEMENT, PROSOURCE TF20,) liquid Place 60 mLs into feeding tube daily.    . rosuvastatin (CRESTOR) 10 MG tablet TAKE 1 TABLET ONCE DAILY. 90 tablet 0  . RSV vaccine recomb adjuvanted (AREXVY) 120 MCG/0.5ML injection Inject into the muscle. 0.5 mL 0  . Sulfamethoxazole-Trimethoprim (SULFAMETHOXAZOLE-TMP DS PO) Take by mouth. (Patient not taking: Reported on 03/29/2022)    . tamsulosin (FLOMAX) 0.4 MG CAPS capsule Take 0.4 mg by mouth daily.     . temazepam (RESTORIL) 15 MG capsule Take 1 capsule (15 mg total) by mouth at bedtime as needed for sleep. 30 capsule 0   No current facility-administered medications for this visit.     PHYSICAL EXAMINATION: ECOG PERFORMANCE STATUS: 0 - Asymptomatic  Vitals:   04/19/22 1440  BP: 99/61  Pulse: 81  Resp: 16  Temp:  97.7 F (36.5 C)  SpO2: 98%   Filed Weights   04/19/22 1440  Weight: 259 lb 4.8 oz (117.6 kg)    Physical Exam Constitutional:      Appearance: Normal appearance.  HENT:     Mouth/Throat:     Mouth: Mucous membranes are moist.     Pharynx: Posterior oropharyngeal erythema (mild mucositis) present.  Cardiovascular:     Rate and Rhythm: Normal rate and regular rhythm.     Pulses: Normal pulses.     Heart sounds: Normal heart sounds.  Musculoskeletal:        General: Normal range of motion.     Cervical back: Normal range of motion and neck supple. No rigidity.  Lymphadenopathy:     Cervical: Cervical adenopathy (Cervical lymphadenopathy continues to improve, near resolution of adenopathy compared to his last) present.  Skin:    General: Skin is warm and dry.  Neurological:     Mental Status: He is alert.   LABORATORY DATA:  I have reviewed the data as listed Lab Results  Component Value Date   WBC 2.4 (L) 04/12/2022   HGB 11.4 (L) 04/12/2022   HCT 32.4 (L) 04/12/2022   MCV 91.3 04/12/2022   PLT 108 (L) 04/12/2022     Chemistry      Component Value Date/Time   NA 134 (L) 04/12/2022 1519   NA 140 10/13/2021 0954   K 4.8 04/12/2022 1519   CL 98 04/12/2022 1519   CO2 32 04/12/2022 1519   BUN 24 (H) 04/12/2022 1519   BUN 20 10/13/2021 0954   CREATININE 0.87 04/12/2022 1519      Component Value Date/Time   CALCIUM 8.9 04/12/2022 1519   ALKPHOS 73 03/30/2021 1119   AST 20 03/30/2021 1119   ALT 18 03/30/2021 1119   BILITOT 0.4 03/30/2021 1119       RADIOGRAPHIC STUDIES: I have personally reviewed the radiological images as listed and  agreed with the findings in the report. IR GJ Tube Change  Result Date: 04/16/2022 INDICATION: 76 year old gentleman with history of head and neck malignancy underwent percutaneous gastrostomy tube placement on 03/08/2022. He returns with difficulty flushing the tube. EXAM: Gastrostomy tube exchange MEDICATIONS: None ANESTHESIA/SEDATION: None CONTRAST:  10 mL of Omnipaque 300-administered into the gastric lumen. FLUOROSCOPY: Radiation Exposure Index (as provided by the fluoroscopic device): 50 mGy Kerma COMPLICATIONS: None immediate. PROCEDURE: Informed written consent was obtained from the patient after a thorough discussion of the procedural risks, benefits and alternatives. All questions were addressed. Maximal Sterile Barrier Technique was utilized including caps, mask, sterile gowns, sterile gloves, sterile drape, hand hygiene and skin antiseptic. A timeout was performed prior to the initiation of the procedure. Contrast administered through the gastrostomy tube showed continued communication with the gastric lumen. However, the balloon was located external to the stomach. 0.035 inch glidewire was advanced through the existing gastrostomy tube. The balloon was deflated and the existing 83 [place] G-tube was removed. New 18 French gastrostomy tube could not be easily advanced into the gastric lumen. Tract dilation was performed with 18 and 20 [place] dilators. New 18 French gastrostomy tube was then advanced into the gastric lumen without difficulty. The balloon was inflated with 10 mL of normal saline. Contrast administered through the new 18 [place] gastrostomy tube confirmed appropriate positioning within the gastric lumen. The tube was flushed and covered with sterile dressing. IMPRESSION: Successful exchange of retracted percutaneous gastrostomy tube with new 58 French balloon retention tube in place. Electronically Signed  By: Sharen Heck  Mir M.D.   On: 04/16/2022 08:16    All questions were answered. The  patient knows to call the clinic with any problems, questions or concerns. I spent 30 minutes in the care of this patient including H and P, review of records, counseling and coordination of care.     Benay Pike, MD 04/19/2022 2:50 PM

## 2022-04-20 ENCOUNTER — Encounter: Payer: Self-pay | Admitting: Hematology and Oncology

## 2022-04-20 ENCOUNTER — Inpatient Hospital Stay: Payer: PPO | Admitting: Dietician

## 2022-04-20 ENCOUNTER — Inpatient Hospital Stay (HOSPITAL_BASED_OUTPATIENT_CLINIC_OR_DEPARTMENT_OTHER): Payer: PPO | Admitting: Physician Assistant

## 2022-04-20 ENCOUNTER — Inpatient Hospital Stay: Payer: PPO

## 2022-04-20 ENCOUNTER — Ambulatory Visit
Admission: RE | Admit: 2022-04-20 | Discharge: 2022-04-20 | Disposition: A | Discharge status: Home / Self Care | Payer: PPO | Source: Ambulatory Visit | Attending: Radiation Oncology | Admitting: Radiation Oncology

## 2022-04-20 ENCOUNTER — Other Ambulatory Visit: Payer: Self-pay

## 2022-04-20 VITALS — BP 120/69 | HR 88 | Temp 98.3°F | Resp 18

## 2022-04-20 DIAGNOSIS — C01 Malignant neoplasm of base of tongue: Secondary | ICD-10-CM

## 2022-04-20 DIAGNOSIS — C109 Malignant neoplasm of oropharynx, unspecified: Secondary | ICD-10-CM

## 2022-04-20 DIAGNOSIS — I959 Hypotension, unspecified: Secondary | ICD-10-CM

## 2022-04-20 DIAGNOSIS — Z5111 Encounter for antineoplastic chemotherapy: Secondary | ICD-10-CM | POA: Diagnosis not present

## 2022-04-20 DIAGNOSIS — Z510 Encounter for antineoplastic radiation therapy: Secondary | ICD-10-CM | POA: Diagnosis not present

## 2022-04-20 DIAGNOSIS — R790 Abnormal level of blood mineral: Secondary | ICD-10-CM

## 2022-04-20 DIAGNOSIS — C760 Malignant neoplasm of head, face and neck: Secondary | ICD-10-CM

## 2022-04-20 DIAGNOSIS — Z95828 Presence of other vascular implants and grafts: Secondary | ICD-10-CM

## 2022-04-20 LAB — RAD ONC ARIA SESSION SUMMARY
Course Elapsed Days: 42
Plan Fractions Treated to Date: 30
Plan Prescribed Dose Per Fraction: 2 Gy
Plan Total Fractions Prescribed: 35
Plan Total Prescribed Dose: 70 Gy
Reference Point Dosage Given to Date: 60 Gy
Reference Point Session Dosage Given: 2 Gy
Session Number: 30

## 2022-04-20 MED ORDER — SODIUM CHLORIDE 0.9% FLUSH: 10.0000 mL | Freq: Once | INTRAVENOUS | Status: DC

## 2022-04-20 MED ORDER — HEPARIN SOD (PORK) LOCK FLUSH 100 UNIT/ML IV SOLN: 500.0000 [IU] | Freq: Once | INTRAVENOUS | Status: DC

## 2022-04-20 MED ORDER — MAGNESIUM SULFATE 4 GM/100ML IV SOLN: 4.0000 g | Freq: Once | INTRAVENOUS | 0 refills | Status: AC

## 2022-04-20 MED ORDER — SODIUM CHLORIDE 0.9 % IV SOLN: INTRAVENOUS | Status: DC

## 2022-04-20 MED ORDER — MAGNESIUM SULFATE 4 GM/100ML IV SOLN
4.0000 g | Freq: Once | INTRAVENOUS | Status: AC
  Administered 2022-04-20: 4 g via INTRAVENOUS
  Filled 2022-04-20: qty 100

## 2022-04-20 MED ORDER — SODIUM CHLORIDE 0.9 % IV SOLN: INTRAVENOUS | Status: AC

## 2022-04-20 MED ORDER — HEPARIN SOD (PORK) LOCK FLUSH 100 UNIT/ML IV SOLN
500.0000 [IU] | Freq: Once | INTRAVENOUS | Status: AC
  Administered 2022-04-20: 500 [IU]

## 2022-04-20 NOTE — Progress Notes (Signed)
Symptom Management Consult note [employer]    Patient Care Team: Kathalene Frames, MD as PCP - General (Internal Medicine) Sueanne Margarita, MD as PCP - Cardiology (Cardiology) Constance Haw, MD as PCP - Electrophysiology (Cardiology) Malmfelt, Stephani Police, RN as Oncology Nurse Navigator Benay Pike, MD as Consulting Physician (Hematology and Oncology) [name], MD as Consulting Physician (Radiation Oncology)    Name of the patient: Clarence Andrade  [id number]  03-31-1946   Date of visit: 04/20/2022   Chief Complaint/Reason for visit: hypotension   Current Therapy: Cisplatin with concurrent radiation   Last treatment:  Day 1   Cycle 6 on 04/13/22   ASSESSMENT & PLAN: Patient is a 76 y.o. male  with oncologic history of squamous cell carcinoma of the oropharynx followed by Dr. Chryl Heck.  I have viewed most recent oncology note and lab work.    #) squamous cell carcinoma of the oropharynx -Concurrent radiation, last one scheduled for 04/27/22   #) Hypotension -BP on arrival for infusion today was 102/57.  After the IV magnesium he was hypotensive 85/54.  Patient given 500 ml NS and blood pressure improved to 120/69. -I reviewed medication list and patient has metoprolol as well as Flomax listed.  He has been already been holding the metoprolol because of hypotension at home.  I recommended he also hold the Flomax temporarily to see if BP improves.  I encourage patient to follow-up with PCP or cardiology if hypotension persists.  He tells me he already has appointment scheduled with multiple providers next month. -Patient is eager and stable for discharge home and will continue to watch blood pressure closely.   Strict ED precautions discussed should symptoms worsen.     Heme/Onc History: Oncology History  Squamous cell carcinoma of oropharynx ([place])  02/17/2022 Initial Diagnosis   Squamous cell carcinoma of oropharynx ([place])   02/17/2022  Cancer Staging   Staging form: Pharynx - HPV-Mediated Oropharynx, AJCC 8th Edition - Clinical: Stage I (cT2, cN1, cM0, p16+) - Signed by Benay Pike, MD on 02/17/2022 Stage prefix: Initial diagnosis   Malignant neoplasm of base of tongue ([place])  02/18/2022 Initial Diagnosis   Malignant neoplasm of base of tongue ([place])   03/09/2022 - 04/13/2022 Chemotherapy   Patient is on Treatment Plan : HEAD/NECK Cisplatin (40) q7d         Interval history-: Clarence Andrade is a 76 y.o. male with oncologic history as above seen in the infusion center today with chief complaint of hypotension.  Patient tells me that when he checks his blood pressure in the mornings it is typically in the 70s.  He feels dizzy when he first wakes up and it slowly resolves during the course of the day.  Usually by the afternoon he is feeling normal.  Patient admits that he wakes up several times to urinate during the night.  He typically does not have any fluid intake overnight.  He denies any falls because of the dizziness. Patient states he has been holding his metoprolol recently because of his low blood pressure.  He started Flomax within the last month because of an enlarged prostate.  He denies any fever, chills, chest pain, shortness of breath numbness, weakness or tingling.  ROS  All other systems are reviewed and are negative for acute change except as noted in the HPI.    Allergies  Allergen Reactions   Sertraline Hcl Other (See Comments)    Tremor, HA, Decreased Libido  Past Medical History:  Diagnosis Date   Abdominal hernia    Midline   Adrenal incidentaloma ([place]) 2010   Aortic stenosis    Ascending aortic aneurysm ([place])    a. 4.4cm by imaging 03/2020.   CAD (coronary artery disease)    a. Cath 03/2018 - CTO of the prox-mid LAD with bridging, L-L and R-L collaterals, moderate, non-obstructive disease involving the proximal LAD, proximal LCx, and distal RCA, more severe disease noted involving  small branch vessels (RV marginal and superior branch of OM3).   Complete heart block (HCC)    Gallstones    Heart murmur    History of pacemaker    a. 11/2018 - Medtronic, intermittent CHB.   Hyperlipidemia    Morbid obesity (HCC)    Myocardial infarction [facility])    NSVT (nonsustained ventricular tachycardia) (HCC)    OSA (obstructive sleep apnea)    Severe PSG 1/07 AHI 66/hr, O2 Nadir 50% Refuses treatment - Pt does not believe test was accurate   Pericarditis    Pneumonia    Pulmonary HTN (HCC)    mild with PASP 38mHg by echo 02/2018, not seen on 03/2020 echo   Pulmonary nodule    Screening for AAA (abdominal aortic aneurysm) 2010   CT Abd/Pelvis   Ventral hernia    03/2009     Past Surgical History:  Procedure Laterality Date   CHOLECYSTECTOMY N/A 10/11/2019   Procedure: LAPAROSCOPIC CHOLECYSTECTOMY WITH INTRAOPERATIVE CHOLANGIOGRAM;  Surgeon: GArmandina Gemma MD;  Location: [facility];  Service: General;  Laterality: N/A;   CORONARY ANGIOGRAPHY N/A 03/23/2018   Procedure: CORONARY ANGIOGRAPHY;  Surgeon: ENelva Bush MD;  Location[facility];  Service: Cardiovascular;  Laterality: N/A;   HERNIA REPAIR  05/2002   three   IR GASTROSTOMY TUBE MOD SED  03/08/2022   IR GJ TUBE CHANGE  04/15/2022   IR IMAGING GUIDED PORT INSERTION  03/08/2022   KNEE ARTHROSCOPY Left 09/2002   PACEMAKER IMPLANT N/A 11/08/2018   Procedure: PACEMAKER IMPLANT;  Surgeon: CConstance Haw MD;  [facility];  Service: Cardiovascular;  Laterality: N/A;   SHOULDER SURGERY Right 2020   TOTAL KNEE ARTHROPLASTY Bilateral 09/2010   VENTRAL HERNIA REPAIR  03/2009    Social History   Socioeconomic History   Marital status: Married    Spouse name: Not on file   Number of children: Not on file   Years of education: Not on file   Highest education level: Not on file  Occupational History   Not on file  Tobacco Use   Smoking status: Former    Packs/day: 0.50    Types:  Cigarettes    Quit date: 160   Years since quitting: 30.9   Smokeless tobacco: Never  Vaping Use   Vaping Use: Never used  Substance and Sexual Activity   Alcohol use: No   Drug use: No   Sexual activity: Not Currently  Other Topics Concern   Not on file  Social History Narrative   Not on file   Social Determinants of Health   Financial Resource Strain: Low Risk  (02/24/2022)   Overall Financial Resource Strain (CARDIA)    Difficulty of Paying Living Expenses: Not hard at all  Food Insecurity: No Food Insecurity (02/24/2022)   Hunger Vital Sign    Worried About Running Out of Food in the Last Year: Never true    Ran Out of Food in the Last Year: Never true  Transportation Needs: No  Transportation Needs (02/24/2022)   PRAPARE - Hydrologist (Medical): No    Lack of Transportation (Non-Medical): No  Physical Activity: Not on file  Stress: Not on file  Social Connections: Not on file  Intimate Partner Violence: Not on file    Family History  Problem Relation Age of Onset   Heart attack Father    Heart disease Father    Aneurysm Father    Breast cancer Sister    Colon cancer Sister      Current Outpatient Medications:    acetaminophen (TYLENOL) 500 MG tablet, Take 1,000 mg by mouth every 6 (six) hours as needed (pain)., Disp: , Rfl:    aspirin EC 81 MG tablet, Take 81 mg by mouth daily., Disp: , Rfl:    buPROPion (WELLBUTRIN XL) 150 MG 24 hr tablet, Take 150 mg by mouth every morning., Disp: , Rfl:    COVID-19 mRNA bivalent vaccine, Pfizer, (PFIZER COVID-19 VAC BIVALENT) injection, Inject into the muscle., Disp: 0.3 mL, Rfl: 0   COVID-19 mRNA Vac-TriS, Pfizer, (PFIZER-BIONT COVID-19 VAC-TRIS) SUSP injection, Inject into the muscle., Disp: 0.3 mL, Rfl: 0   COVID-19 mRNA vaccine 2023-2024 (COMIRNATY) syringe, Inject into the muscle., Disp: 0.3 mL, Rfl: 0   influenza vaccine adjuvanted (FLUAD) 0.5 ML injection, Inject into the muscle., Disp:  0.5 mL, Rfl: 0   isosorbide mononitrate (IMDUR) 30 MG 24 hr tablet, TAKE 1 TABLET ONCE DAILY., Disp: 90 tablet, Rfl: 0   magnesium sulfate 4 GM/100ML SOLN infusion, Inject 100 mLs (4 g total) into the vein once for 1 dose., Disp: 100 mL, Rfl: 0   metoprolol succinate (TOPROL-XL) 25 MG 24 hr tablet, Take 0.5 tablets (12.5 mg total) by mouth daily., Disp: 90 tablet, Rfl: 3   Multiple Vitamin (MULTIVITAMIN) tablet, Take 1 tablet by mouth daily., Disp: , Rfl:    nitroGLYCERIN (NITROSTAT) 0.4 MG SL tablet, Place 1 tablet (0.4 mg total) under the tongue every 5 (five) minutes as needed for chest pain. (Patient not taking: Reported on 03/29/2022), Disp: 25 tablet, Rfl: 3   Nutritional Supplements (FEEDING SUPPLEMENT, OSMOLITE 1.5 CAL,) LIQD, 7 cartons Osmolite 1.5/equivalent split over four feedings. Flush tube with 90 ml water before and after each feeding. Drink by mouth or give via tube additional 474 ml (2 c.) water. Provides 1659 ml/day 2485 kcal, 104 g protein, 1267 ml free water (2461 ml total water) Meets 100% RDI, Disp: 1659 mL, Rfl: 0   Protein (FEEDING SUPPLEMENT, PROSOURCE TF20,) liquid, Place 60 mLs into feeding tube daily., Disp: , Rfl:    rosuvastatin (CRESTOR) 10 MG tablet, TAKE 1 TABLET ONCE DAILY., Disp: 90 tablet, Rfl: 0   RSV vaccine recomb adjuvanted (AREXVY) 120 MCG/0.5ML injection, Inject into the muscle., Disp: 0.5 mL, Rfl: 0   tamsulosin (FLOMAX) 0.4 MG CAPS capsule, Take 0.4 mg by mouth daily., Disp: , Rfl:    temazepam (RESTORIL) 15 MG capsule, Take 1 capsule (15 mg total) by mouth at bedtime as needed for sleep., Disp: 30 capsule, Rfl: 0 No current facility-administered medications for this visit.  Facility-Administered Medications Ordered in Other Visits:    0.9 %  sodium chloride infusion, , Intravenous, Continuous, Iruku, Praveena, MD, Stopped at 04/20/22 1131   heparin lock flush 100 unit/mL, 500 Units, Intravenous, Once, Walisiewicz, Amandine Covino E, PA-C   sodium chloride flush  (NS) 0.9 % injection 10 mL, 10 mL, Intracatheter, Once, Iruku, Praveena, MD  PHYSICAL EXAM: ECOG FS:1 - Symptomatic but completely ambulatory  T: 98.3   BP: 85/54    HR: 88   Resp: 18    O2: 98% on RA Physical Exam Vitals and nursing note reviewed.  Constitutional:      Appearance: He is well-developed. He is not ill-appearing or toxic-appearing.  HENT:     Head: Normocephalic.     Nose: Nose normal.  Eyes:     Conjunctiva/sclera: Conjunctivae normal.  Neck:     Vascular: No JVD.  Cardiovascular:     Rate and Rhythm: Normal rate and regular rhythm.     Pulses: Normal pulses.     Heart sounds: Normal heart sounds.  Pulmonary:     Effort: Pulmonary effort is normal.     Breath sounds: Normal breath sounds.  Chest:     Comments: PAC without signs of infection Abdominal:     General: There is no distension.  Musculoskeletal:     Cervical back: Normal range of motion.     Right lower leg: No edema.     Left lower leg: No edema.  Skin:    General: Skin is warm and dry.  Neurological:     Mental Status: He is oriented to person, place, and time.     Motor: No weakness.     Comments: Speech is clear and goal oriented, follows commands Normal gait and balance          LABORATORY DATA: I have reviewed the data as listed    Latest Ref Rng & Units 04/19/2022    2:14 PM 04/12/2022    3:19 PM 04/05/2022    2:13 PM  CBC  WBC 4.0 - 10.5 K/uL 1.9  2.4  3.9   Hemoglobin 13.0 - 17.0 g/dL 10.7  11.4  12.1   Hematocrit 39.0 - 52.0 % 30.4  32.4  35.2   Platelets 150 - 400 K/uL 139  108  146         Latest Ref Rng & Units 04/19/2022    2:14 PM 04/12/2022    3:19 PM 04/05/2022    2:13 PM  CMP  Glucose 70 - 99 mg/dL 126  102  108   BUN 8 - 23 mg/dL '25  24  27   '$ Creatinine 0.61 - 1.24 mg/dL 0.87  0.87  0.94   Sodium 135 - 145 mmol/L 133  134  136   Potassium 3.5 - 5.1 mmol/L 4.8  4.8  4.6   Chloride 98 - 111 mmol/L 97  98  101   CO2 22 - 32 mmol/L 30  32  31   Calcium  8.9 - 10.3 mg/dL 8.9  8.9  9.2        RADIOGRAPHIC STUDIES (from last 24 hours if applicable) I have personally reviewed the radiological images as listed and agreed with the findings in the report. No results found.      Visit Diagnosis: 1. Hypotension, unspecified hypotension type   2. Squamous cell carcinoma of oropharynx (HCC)      No orders of the defined types were placed in this encounter.   All questions were answered. The patient knows to call the clinic with any problems, questions or concerns. No barriers to learning was detected.  I have spent a total of 20 minutes minutes of face-to-face and non-face-to-face time, preparing to see the patient, obtaining and/or reviewing separately obtained history, performing a medically appropriate examination, counseling and educating the patient, ordering tests, documenting clinical information in the electronic health record, and care coordination (  communications with other health care professionals or caregivers).    Thank you for allowing me to participate in the care of this patient.    Barrie Folk, PA-C Department of Hematology/Oncology [facility] at [facility] Phone: [phone]  [phone]    04/20/2022 12:33 PM

## 2022-04-20 NOTE — Progress Notes (Signed)
When discharge vitals were taken by RN, pt BP was showing 78/56. Pt positive for dizziness, negative for chest pain/SOB. Kait PA notified. 571m NS bolus started per PA.   BP improved post bolus (see flowsheets). Pt cleared for d/c per Kait PA

## 2022-04-20 NOTE — Progress Notes (Signed)
Nutrition Follow-up:  Patient with stage II SCC of oropharynx. He is currently concurrent chemoradiation with weekly cisplatin (first treatment 11/1). S/p G-tube placement 10/31.     Patient receiving IV fluids + 4g Mg  RD working remotely.  Spoke with patient via telephone. He is unable to take in oral intake. Patient reports it feels like he is swallowing glass. His saliva is ropey and thick. This makes him feel nauseas. He denies vomiting. Patient reports feeding tube replaced in IR 12/8. This is working well. Patient reports giving 3-4 cartons of Osmolite. He is working to increase this. Patient states he is only able to tolerate ~16 ounces bolus. Patient feels nauseas with more. He has not tried taking nausea medication prior to tube feeds.    Medications: reviewed   Labs: Mg 0.9, Na 133, glucose 126, BUN 25  Anthropometrics: Wt 259 lb 4.8 oz decreased 1.5% in 7 days, 8.8% in 6 weeks - severe   12/5 - 263 lb 11/28 - 262 lb 14.4 oz  11/21 - 269 lb 9.6 oz 11/14 - 273 lb 3.2 oz  11/7 - 276 lb 4.8 oz  11/1 - 284 lb 4 oz   Estimated Energy Needs  Kcals: 2400-2625 Protein: 129-145 Fluid: 2.4 L  NUTRITION DIAGNOSIS: Inadequate oral intake continues - pt relying on tube    INTERVENTION:  Reinforced importance of increasing tube feedings to prevent further weight loss. He is agreeable to increase as tolerated Patient previously declined shipment of formula, protein modular, supplies from [employer] 3 Osmolite 1.5 + 3 Ensure Complete via tube. Flush with 240 ml water 5 times daily. This provides 2115 kcal, 134.7 grams protein, 1083 ml free water (2283 ml total water) Take nausea medication as prescribed - suggested giving first bolus feed 30 minutes after antiemetics  Support and encouragement    MONITORING, EVALUATION, GOAL: weight trends, intake, tube feedings   NEXT VISIT: Tuesday December 19 after radiation

## 2022-04-20 NOTE — Assessment & Plan Note (Signed)
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  PET/CT without any evidence of distant metastatic disease.  He is currently on concurrent chemo and radiation, status post 6 weekly cycles of cisplatin.  He is tolerating it very well except for severe fatigue.  He is currently also dealing with inability to swallow and severe sore throat on swallowing hence using G-tube for feeding mostly.  He lost a few pounds since his last visit.  He is trying to increase his caloric intake via the G-tube.  He denies any side effects from chemotherapy except exhaustion.  Labs look adequate to proceed with treatment however since he is having severe fatigue and has completed 6 weekly cycles, we have discussed about omitting the last cycle of cisplatin.  He will however continue radiation as recommended.  He will return to clinic to see me in about 4 weeks or sooner as needed.  He understands the general surveillance plan.

## 2022-04-20 NOTE — Patient Instructions (Signed)
Magnesium Sulfate Injection What is this medication? MAGNESIUM SULFATE (mag NEE zee um SUL fate) prevents and treats low levels of magnesium in your body. It may also be used to prevent and treat seizures during pregnancy in people with high blood pressure disorders, such as preeclampsia or eclampsia. Magnesium plays an important role in maintaining the health of your muscles and nervous system. This medicine may be used for other purposes; ask your health care provider or pharmacist if you have questions. What should I tell my care team before I take this medication? They need to know if you have any of these conditions: Heart disease History of irregular heart beat Kidney disease An unusual or allergic reaction to magnesium sulfate, medications, foods, dyes, or preservatives Pregnant or trying to get pregnant Breast-feeding How should I use this medication? This medication is for infusion into a vein. It is given in a hospital or clinic setting. Talk to your care team about the use of this medication in children. While this medication may be prescribed for selected conditions, precautions do apply. Overdosage: If you think you have taken too much of this medicine contact a poison control center or emergency room at once. NOTE: This medicine is only for you. Do not share this medicine with others. What if I miss a dose? This does not apply. What may interact with this medication? Certain medications for anxiety or sleep Certain medications for seizures, such phenobarbital Digoxin Medications that relax muscles for surgery Narcotic medications for pain This list may not describe all possible interactions. Give your health care provider a list of all the medicines, herbs, non-prescription drugs, or dietary supplements you use. Also tell them if you smoke, drink alcohol, or use illegal drugs. Some items may interact with your medicine. What should I watch for while using this  medication? Your condition will be monitored carefully while you are receiving this medication. You may need blood work done while you are receiving this medication. What side effects may I notice from receiving this medication? Side effects that you should report to your care team as soon as possible: Allergic reactions--skin rash, itching, hives, swelling of the face, lips, tongue, or throat High magnesium level--confusion, drowsiness, facial flushing, redness, sweating, muscle weakness, fast or irregular heartbeat, trouble breathing Low blood pressure--dizziness, feeling faint or lightheaded, blurry vision Side effects that usually do not require medical attention (report to your care team if they continue or are bothersome): Headache Nausea This list may not describe all possible side effects. Call your doctor for medical advice about side effects. You may report side effects to FDA at 1-800-FDA-1088. Where should I keep my medication? This medication is given in a hospital or clinic and will not be stored at home. NOTE: This sheet is a summary. It may not cover all possible information. If you have questions about this medicine, talk to your doctor, pharmacist, or health care provider.  2023 Elsevier/Gold Standard (2012-08-31 00:00:00)  

## 2022-04-21 ENCOUNTER — Other Ambulatory Visit: Payer: Self-pay

## 2022-04-21 ENCOUNTER — Ambulatory Visit
Admission: RE | Admit: 2022-04-21 | Discharge: 2022-04-21 | Disposition: A | Discharge status: Home / Self Care | Payer: PPO | Source: Ambulatory Visit | Attending: Radiation Oncology | Admitting: Radiation Oncology

## 2022-04-21 DIAGNOSIS — Z5111 Encounter for antineoplastic chemotherapy: Secondary | ICD-10-CM | POA: Diagnosis not present

## 2022-04-21 DIAGNOSIS — C01 Malignant neoplasm of base of tongue: Secondary | ICD-10-CM | POA: Diagnosis not present

## 2022-04-21 DIAGNOSIS — Z510 Encounter for antineoplastic radiation therapy: Secondary | ICD-10-CM | POA: Diagnosis not present

## 2022-04-21 DIAGNOSIS — C109 Malignant neoplasm of oropharynx, unspecified: Secondary | ICD-10-CM | POA: Diagnosis not present

## 2022-04-21 LAB — RAD ONC ARIA SESSION SUMMARY
Course Elapsed Days: 43
Plan Fractions Treated to Date: 31
Plan Prescribed Dose Per Fraction: 2 Gy
Plan Total Fractions Prescribed: 35
Plan Total Prescribed Dose: 70 Gy
Reference Point Dosage Given to Date: 62 Gy
Reference Point Session Dosage Given: 2 Gy
Session Number: 31

## 2022-04-22 ENCOUNTER — Other Ambulatory Visit: Payer: Self-pay

## 2022-04-22 ENCOUNTER — Ambulatory Visit
Admission: RE | Admit: 2022-04-22 | Discharge: 2022-04-22 | Disposition: A | Discharge status: Home / Self Care | Payer: PPO | Source: Ambulatory Visit | Attending: Radiation Oncology | Admitting: Radiation Oncology

## 2022-04-22 DIAGNOSIS — Z5111 Encounter for antineoplastic chemotherapy: Secondary | ICD-10-CM | POA: Diagnosis not present

## 2022-04-22 DIAGNOSIS — C01 Malignant neoplasm of base of tongue: Secondary | ICD-10-CM | POA: Diagnosis not present

## 2022-04-22 DIAGNOSIS — C109 Malignant neoplasm of oropharynx, unspecified: Secondary | ICD-10-CM | POA: Diagnosis not present

## 2022-04-22 DIAGNOSIS — Z510 Encounter for antineoplastic radiation therapy: Secondary | ICD-10-CM | POA: Diagnosis not present

## 2022-04-22 LAB — RAD ONC ARIA SESSION SUMMARY
Course Elapsed Days: 44
Plan Fractions Treated to Date: 32
Plan Prescribed Dose Per Fraction: 2 Gy
Plan Total Fractions Prescribed: 35
Plan Total Prescribed Dose: 70 Gy
Reference Point Dosage Given to Date: 64 Gy
Reference Point Session Dosage Given: 2 Gy
Session Number: 32

## 2022-04-25 ENCOUNTER — Other Ambulatory Visit: Payer: Self-pay

## 2022-04-25 ENCOUNTER — Ambulatory Visit
Admission: RE | Admit: 2022-04-25 | Discharge: 2022-04-25 | Disposition: A | Discharge status: Home / Self Care | Payer: PPO | Source: Ambulatory Visit | Attending: Radiation Oncology | Admitting: Radiation Oncology

## 2022-04-25 ENCOUNTER — Other Ambulatory Visit: Payer: Self-pay | Admitting: Radiation Oncology

## 2022-04-25 DIAGNOSIS — Z510 Encounter for antineoplastic radiation therapy: Secondary | ICD-10-CM | POA: Diagnosis not present

## 2022-04-25 DIAGNOSIS — C109 Malignant neoplasm of oropharynx, unspecified: Secondary | ICD-10-CM

## 2022-04-25 DIAGNOSIS — Z5111 Encounter for antineoplastic chemotherapy: Secondary | ICD-10-CM | POA: Diagnosis not present

## 2022-04-25 DIAGNOSIS — C01 Malignant neoplasm of base of tongue: Secondary | ICD-10-CM | POA: Diagnosis not present

## 2022-04-25 LAB — RAD ONC ARIA SESSION SUMMARY
Course Elapsed Days: 47
Plan Fractions Treated to Date: 33
Plan Prescribed Dose Per Fraction: 2 Gy
Plan Total Fractions Prescribed: 35
Plan Total Prescribed Dose: 70 Gy
Reference Point Dosage Given to Date: 66 Gy
Reference Point Session Dosage Given: 2 Gy
Session Number: 33

## 2022-04-25 MED ORDER — SCOPOLAMINE 1 MG/3DAYS TD PT72: 1.0000 | MEDICATED_PATCH | TRANSDERMAL | 2 refills | Status: DC

## 2022-04-26 ENCOUNTER — Ambulatory Visit: Payer: PPO

## 2022-04-26 ENCOUNTER — Ambulatory Visit: Admission: RE | Admit: 2022-04-26 | Payer: PPO | Source: Ambulatory Visit

## 2022-04-26 ENCOUNTER — Inpatient Hospital Stay: Payer: PPO | Admitting: Dietician

## 2022-04-26 NOTE — Progress Notes (Signed)
Nutrition Follow-up:  Patient with stage II SCC of oropharynx. He is currently concurrent chemoradiation with weekly cisplatin (first treatment 11/1). S/p G-tube placement 10/31. Final radiation planned 12/20.  Met with patient in office before radiation. He is looking forward to completing treatment tomorrow. Patient reports scopolamine patches seem to be helping with increased saliva. Things " seem to have calmed down today" Patient continues to have nausea. He is taking zofran once a day. He is not eating or drinking orally. Patient reports giving 3-4 bolus feedings day, most of the time it is 3. Patient recalls giving one carton Boost VHC and 2 Ensure Complete. Patient says he adds one cup of whole milk at each feeding. This provides 1680 kcal, 107 g protein. He is giving ~32 ounces of water via tube.   Medications: scopolamine, restoril  Labs: no new labs   Anthropometrics: No new wt for review. Last wt 259 lb 4.8 oz on 12/12   Estimated Energy Needs  Kcals: 2400-2625 Protein: 129-145 Fluid: 2.4 L  NUTRITION DIAGNOSIS: Inadequate oral intake continues, pt relying on tube    INTERVENTION:  Continued encouragement for increasing tube feedings as tolerated - currently meeting 70% kcal, 83% protein needs  Patient will increase water flushes, recommend 64 ounces/day Reviewed taking nausea medications as prescribed, waiting 30 minutes after taking before bolus Support and encouragement  Patient has contact information     MONITORING, EVALUATION, GOAL: weight trends, tube feedings   NEXT VISIT: Friday January 5 before MD

## 2022-04-27 ENCOUNTER — Ambulatory Visit: Payer: PPO

## 2022-04-28 ENCOUNTER — Ambulatory Visit: Payer: PPO

## 2022-05-03 ENCOUNTER — Other Ambulatory Visit (HOSPITAL_COMMUNITY): Payer: PPO

## 2022-05-03 ENCOUNTER — Inpatient Hospital Stay (HOSPITAL_COMMUNITY): Payer: PPO

## 2022-05-03 ENCOUNTER — Encounter (HOSPITAL_COMMUNITY): Payer: Self-pay | Admitting: Internal Medicine

## 2022-05-03 ENCOUNTER — Telehealth: Payer: Self-pay

## 2022-05-03 ENCOUNTER — Emergency Department (HOSPITAL_COMMUNITY): Payer: PPO

## 2022-05-03 ENCOUNTER — Inpatient Hospital Stay (HOSPITAL_COMMUNITY)
Admission: EM | Admit: 2022-05-03 | Discharge: 2022-05-07 | DRG: 644 | Disposition: A | Discharge status: Home Health | Payer: PPO | Attending: Internal Medicine | Admitting: Internal Medicine

## 2022-05-03 ENCOUNTER — Telehealth: Payer: Self-pay | Admitting: *Deleted

## 2022-05-03 ENCOUNTER — Other Ambulatory Visit: Payer: Self-pay

## 2022-05-03 DIAGNOSIS — R531 Weakness: Secondary | ICD-10-CM | POA: Diagnosis not present

## 2022-05-03 DIAGNOSIS — R627 Adult failure to thrive: Secondary | ICD-10-CM | POA: Diagnosis present

## 2022-05-03 DIAGNOSIS — Z6836 Body mass index (BMI) 36.0-36.9, adult: Secondary | ICD-10-CM

## 2022-05-03 DIAGNOSIS — S0990XA Unspecified injury of head, initial encounter: Secondary | ICD-10-CM | POA: Diagnosis not present

## 2022-05-03 DIAGNOSIS — Z9049 Acquired absence of other specified parts of digestive tract: Secondary | ICD-10-CM

## 2022-05-03 DIAGNOSIS — I509 Heart failure, unspecified: Secondary | ICD-10-CM | POA: Diagnosis not present

## 2022-05-03 DIAGNOSIS — M47812 Spondylosis without myelopathy or radiculopathy, cervical region: Secondary | ICD-10-CM | POA: Diagnosis not present

## 2022-05-03 DIAGNOSIS — Z87440 Personal history of urinary (tract) infections: Secondary | ICD-10-CM

## 2022-05-03 DIAGNOSIS — Z1152 Encounter for screening for COVID-19: Secondary | ICD-10-CM

## 2022-05-03 DIAGNOSIS — I10 Essential (primary) hypertension: Secondary | ICD-10-CM | POA: Diagnosis not present

## 2022-05-03 DIAGNOSIS — C109 Malignant neoplasm of oropharynx, unspecified: Secondary | ICD-10-CM | POA: Diagnosis not present

## 2022-05-03 DIAGNOSIS — R059 Cough, unspecified: Secondary | ICD-10-CM | POA: Diagnosis present

## 2022-05-03 DIAGNOSIS — Z931 Gastrostomy status: Secondary | ICD-10-CM

## 2022-05-03 DIAGNOSIS — Z87891 Personal history of nicotine dependence: Secondary | ICD-10-CM

## 2022-05-03 DIAGNOSIS — R410 Disorientation, unspecified: Secondary | ICD-10-CM | POA: Diagnosis not present

## 2022-05-03 DIAGNOSIS — R609 Edema, unspecified: Secondary | ICD-10-CM

## 2022-05-03 DIAGNOSIS — E222 Syndrome of inappropriate secretion of antidiuretic hormone: Principal | ICD-10-CM | POA: Diagnosis present

## 2022-05-03 DIAGNOSIS — R680 Hypothermia, not associated with low environmental temperature: Secondary | ICD-10-CM | POA: Diagnosis present

## 2022-05-03 DIAGNOSIS — E876 Hypokalemia: Principal | ICD-10-CM | POA: Diagnosis present

## 2022-05-03 DIAGNOSIS — W1830XA Fall on same level, unspecified, initial encounter: Secondary | ICD-10-CM | POA: Diagnosis present

## 2022-05-03 DIAGNOSIS — I350 Nonrheumatic aortic (valve) stenosis: Secondary | ICD-10-CM | POA: Diagnosis not present

## 2022-05-03 DIAGNOSIS — Z950 Presence of cardiac pacemaker: Secondary | ICD-10-CM

## 2022-05-03 DIAGNOSIS — B961 Klebsiella pneumoniae [K. pneumoniae] as the cause of diseases classified elsewhere: Secondary | ICD-10-CM | POA: Diagnosis present

## 2022-05-03 DIAGNOSIS — N390 Urinary tract infection, site not specified: Secondary | ICD-10-CM | POA: Diagnosis present

## 2022-05-03 DIAGNOSIS — Z79899 Other long term (current) drug therapy: Secondary | ICD-10-CM

## 2022-05-03 DIAGNOSIS — E440 Moderate protein-calorie malnutrition: Secondary | ICD-10-CM | POA: Diagnosis not present

## 2022-05-03 DIAGNOSIS — Z9221 Personal history of antineoplastic chemotherapy: Secondary | ICD-10-CM

## 2022-05-03 DIAGNOSIS — I442 Atrioventricular block, complete: Secondary | ICD-10-CM | POA: Diagnosis present

## 2022-05-03 DIAGNOSIS — Z7982 Long term (current) use of aspirin: Secondary | ICD-10-CM

## 2022-05-03 DIAGNOSIS — Y842 Radiological procedure and radiotherapy as the cause of abnormal reaction of the patient, or of later complication, without mention of misadventure at the time of the procedure: Secondary | ICD-10-CM | POA: Diagnosis present

## 2022-05-03 DIAGNOSIS — I2510 Atherosclerotic heart disease of native coronary artery without angina pectoris: Secondary | ICD-10-CM | POA: Diagnosis present

## 2022-05-03 DIAGNOSIS — E871 Hypo-osmolality and hyponatremia: Secondary | ICD-10-CM | POA: Diagnosis not present

## 2022-05-03 DIAGNOSIS — I252 Old myocardial infarction: Secondary | ICD-10-CM

## 2022-05-03 DIAGNOSIS — R600 Localized edema: Secondary | ICD-10-CM | POA: Diagnosis present

## 2022-05-03 DIAGNOSIS — I7121 Aneurysm of the ascending aorta, without rupture: Secondary | ICD-10-CM | POA: Diagnosis not present

## 2022-05-03 DIAGNOSIS — N400 Enlarged prostate without lower urinary tract symptoms: Secondary | ICD-10-CM | POA: Diagnosis present

## 2022-05-03 DIAGNOSIS — E860 Dehydration: Secondary | ICD-10-CM | POA: Diagnosis not present

## 2022-05-03 DIAGNOSIS — R296 Repeated falls: Secondary | ICD-10-CM | POA: Diagnosis present

## 2022-05-03 DIAGNOSIS — I272 Pulmonary hypertension, unspecified: Secondary | ICD-10-CM | POA: Diagnosis not present

## 2022-05-03 DIAGNOSIS — R0602 Shortness of breath: Secondary | ICD-10-CM | POA: Diagnosis not present

## 2022-05-03 DIAGNOSIS — Z888 Allergy status to other drugs, medicaments and biological substances status: Secondary | ICD-10-CM

## 2022-05-03 DIAGNOSIS — Z923 Personal history of irradiation: Secondary | ICD-10-CM

## 2022-05-03 DIAGNOSIS — R42 Dizziness and giddiness: Secondary | ICD-10-CM | POA: Diagnosis not present

## 2022-05-03 DIAGNOSIS — E785 Hyperlipidemia, unspecified: Secondary | ICD-10-CM | POA: Diagnosis present

## 2022-05-03 DIAGNOSIS — T451X5A Adverse effect of antineoplastic and immunosuppressive drugs, initial encounter: Secondary | ICD-10-CM | POA: Diagnosis present

## 2022-05-03 DIAGNOSIS — R443 Hallucinations, unspecified: Secondary | ICD-10-CM | POA: Diagnosis not present

## 2022-05-03 DIAGNOSIS — Z96653 Presence of artificial knee joint, bilateral: Secondary | ICD-10-CM | POA: Diagnosis present

## 2022-05-03 DIAGNOSIS — E869 Volume depletion, unspecified: Secondary | ICD-10-CM | POA: Diagnosis present

## 2022-05-03 DIAGNOSIS — Z8249 Family history of ischemic heart disease and other diseases of the circulatory system: Secondary | ICD-10-CM

## 2022-05-03 DIAGNOSIS — E669 Obesity, unspecified: Secondary | ICD-10-CM | POA: Diagnosis present

## 2022-05-03 DIAGNOSIS — K1233 Oral mucositis (ulcerative) due to radiation: Secondary | ICD-10-CM | POA: Diagnosis present

## 2022-05-03 DIAGNOSIS — R634 Abnormal weight loss: Secondary | ICD-10-CM | POA: Diagnosis present

## 2022-05-03 DIAGNOSIS — R911 Solitary pulmonary nodule: Secondary | ICD-10-CM | POA: Diagnosis present

## 2022-05-03 DIAGNOSIS — G4733 Obstructive sleep apnea (adult) (pediatric): Secondary | ICD-10-CM | POA: Diagnosis present

## 2022-05-03 LAB — CBC WITH DIFFERENTIAL/PLATELET
Abs Immature Granulocytes: 0.1 10*3/uL — ABNORMAL HIGH (ref 0.00–0.07)
Basophils Absolute: 0 10*3/uL (ref 0.0–0.1)
Basophils Relative: 0 %
Eosinophils Absolute: 0 10*3/uL (ref 0.0–0.5)
Eosinophils Relative: 0 %
HCT: 27.3 % — ABNORMAL LOW (ref 39.0–52.0)
Hemoglobin: 10.1 g/dL — ABNORMAL LOW (ref 13.0–17.0)
Immature Granulocytes: 2 %
Lymphocytes Relative: 8 %
Lymphs Abs: 0.4 10*3/uL — ABNORMAL LOW (ref 0.7–4.0)
MCH: 32.8 pg (ref 26.0–34.0)
MCHC: 37 g/dL — ABNORMAL HIGH (ref 30.0–36.0)
MCV: 88.6 fL (ref 80.0–100.0)
Monocytes Absolute: 1.3 10*3/uL — ABNORMAL HIGH (ref 0.1–1.0)
Monocytes Relative: 24 %
Neutro Abs: 3.4 10*3/uL (ref 1.7–7.7)
Neutrophils Relative %: 66 %
Platelets: 188 10*3/uL (ref 150–400)
RBC: 3.08 MIL/uL — ABNORMAL LOW (ref 4.22–5.81)
RDW: 17.4 % — ABNORMAL HIGH (ref 11.5–15.5)
WBC: 5.2 10*3/uL (ref 4.0–10.5)
nRBC: 0 % (ref 0.0–0.2)

## 2022-05-03 LAB — COMPREHENSIVE METABOLIC PANEL
ALT: 39 U/L (ref 0–44)
AST: 62 U/L — ABNORMAL HIGH (ref 15–41)
Albumin: 3.1 g/dL — ABNORMAL LOW (ref 3.5–5.0)
Alkaline Phosphatase: 57 U/L (ref 38–126)
Anion gap: 10 (ref 5–15)
BUN: 23 mg/dL (ref 8–23)
CO2: 28 mmol/L (ref 22–32)
Calcium: 8.7 mg/dL — ABNORMAL LOW (ref 8.9–10.3)
Chloride: 76 mmol/L — ABNORMAL LOW (ref 98–111)
Creatinine, Ser: 0.69 mg/dL (ref 0.61–1.24)
GFR, Estimated: >60 mL/min (ref >60)
Glucose, Bld: 108 mg/dL — ABNORMAL HIGH (ref 70–99)
Potassium: 4.9 mmol/L (ref 3.5–5.1)
Sodium: 114 mmol/L — CL (ref 135–145)
Total Bilirubin: 0.8 mg/dL (ref 0.3–1.2)
Total Protein: 6.3 g/dL — ABNORMAL LOW (ref 6.5–8.1)

## 2022-05-03 LAB — RESP PANEL BY RT-PCR (RSV, FLU A&B, COVID)  RVPGX2
Influenza A by PCR: NEGATIVE
Influenza B by PCR: NEGATIVE
Resp Syncytial Virus by PCR: NEGATIVE
SARS Coronavirus 2 by RT PCR: NEGATIVE

## 2022-05-03 LAB — URINALYSIS, ROUTINE W REFLEX MICROSCOPIC
Bilirubin Urine: NEGATIVE
Glucose, UA: NEGATIVE mg/dL
Ketones, ur: 5 mg/dL — AB
Nitrite: NEGATIVE
Protein, ur: NEGATIVE mg/dL
Specific Gravity, Urine: 1.02 (ref 1.005–1.030)
WBC, UA: >50 WBC/hpf — ABNORMAL HIGH (ref 0–5)
pH: 6 (ref 5.0–8.0)

## 2022-05-03 LAB — LACTIC ACID, PLASMA: Lactic Acid, Venous: 1.1 mmol/L (ref 0.5–1.9)

## 2022-05-03 LAB — MAGNESIUM: Magnesium: 1.2 mg/dL — ABNORMAL LOW (ref 1.7–2.4)

## 2022-05-03 LAB — URIC ACID: Uric Acid, Serum: 2.6 mg/dL — ABNORMAL LOW (ref 3.7–8.6)

## 2022-05-03 LAB — SODIUM, URINE, RANDOM: Sodium, Ur: 14 mmol/L

## 2022-05-03 LAB — SODIUM
Sodium: 115 mmol/L — CL (ref 135–145)
Sodium: 116 mmol/L — CL (ref 135–145)

## 2022-05-03 LAB — TROPONIN I (HIGH SENSITIVITY)
Troponin I (High Sensitivity): 21 ng/L — ABNORMAL HIGH (ref <18)
Troponin I (High Sensitivity): 18 ng/L — ABNORMAL HIGH (ref <18)

## 2022-05-03 LAB — CREATININE, URINE, RANDOM: Creatinine, Urine: 126 mg/dL

## 2022-05-03 LAB — BRAIN NATRIURETIC PEPTIDE: B Natriuretic Peptide: 181.7 pg/mL — ABNORMAL HIGH (ref 0.0–100.0)

## 2022-05-03 LAB — PHOSPHORUS: Phosphorus: 3.2 mg/dL (ref 2.5–4.6)

## 2022-05-03 LAB — LIPASE, BLOOD: Lipase: 31 U/L (ref 11–51)

## 2022-05-03 MED ORDER — TEMAZEPAM 15 MG PO CAPS: 15.0000 mg | ORAL_CAPSULE | Freq: Every evening | ORAL | Status: DC | PRN

## 2022-05-03 MED ORDER — ASPIRIN 81 MG PO TBEC
81.0000 mg | DELAYED_RELEASE_TABLET | Freq: Every day | ORAL | Status: DC
  Administered 2022-05-03 – 2022-05-07 (×5): 81 mg via ORAL
  Filled 2022-05-03 (×5): qty 1

## 2022-05-03 MED ORDER — SODIUM CHLORIDE 0.9 % IV BOLUS
500.0000 mL | Freq: Once | INTRAVENOUS | Status: AC
  Administered 2022-05-03: 500 mL via INTRAVENOUS

## 2022-05-03 MED ORDER — NITROGLYCERIN 0.4 MG SL SUBL: 0.4000 mg | SUBLINGUAL_TABLET | SUBLINGUAL | Status: DC | PRN

## 2022-05-03 MED ORDER — ROSUVASTATIN CALCIUM 10 MG PO TABS
10.0000 mg | ORAL_TABLET | Freq: Every day | ORAL | Status: DC
  Administered 2022-05-03 – 2022-05-07 (×5): 10 mg via ORAL
  Filled 2022-05-03 (×5): qty 1

## 2022-05-03 MED ORDER — ENSURE ENLIVE PO LIQD
237.0000 mL | Freq: Three times a day (TID) | ORAL | Status: DC
  Administered 2022-05-03 – 2022-05-05 (×4): 237 mL
  Filled 2022-05-03 (×5): qty 237

## 2022-05-03 MED ORDER — MAGNESIUM SULFATE 2 GM/50ML IV SOLN
2.0000 g | Freq: Once | INTRAVENOUS | Status: AC
  Administered 2022-05-03: 2 g via INTRAVENOUS
  Filled 2022-05-03: qty 50

## 2022-05-03 MED ORDER — SODIUM CHLORIDE 0.9 % IV SOLN: INTRAVENOUS | Status: DC

## 2022-05-03 MED ORDER — ISOSORBIDE MONONITRATE ER 30 MG PO TB24
15.0000 mg | ORAL_TABLET | Freq: Every day | ORAL | Status: DC
  Administered 2022-05-03: 15 mg via ORAL
  Filled 2022-05-03 (×2): qty 1

## 2022-05-03 MED ORDER — SODIUM CHLORIDE 0.9 % IV SOLN
1.0000 g | Freq: Once | INTRAVENOUS | Status: AC
  Administered 2022-05-03: 1 g via INTRAVENOUS
  Filled 2022-05-03: qty 10

## 2022-05-03 MED ORDER — TAMSULOSIN HCL 0.4 MG PO CAPS
0.4000 mg | ORAL_CAPSULE | Freq: Every day | ORAL | Status: DC
  Administered 2022-05-03 – 2022-05-07 (×4): 0.4 mg via ORAL
  Filled 2022-05-03 (×5): qty 1

## 2022-05-03 MED ORDER — ADULT MULTIVITAMIN W/MINERALS CH
1.0000 | ORAL_TABLET | Freq: Every day | ORAL | Status: DC
  Administered 2022-05-03 – 2022-05-07 (×5): 1 via ORAL
  Filled 2022-05-03 (×5): qty 1

## 2022-05-03 MED ORDER — PROSOURCE TF20 ENFIT COMPATIBL EN LIQD: 60.0000 mL | Freq: Every day | ENTERAL | Status: DC

## 2022-05-03 MED ORDER — SODIUM CHLORIDE 0.9 % IV SOLN
1.0000 g | INTRAVENOUS | Status: DC
  Administered 2022-05-04: 1 g via INTRAVENOUS
  Filled 2022-05-03: qty 10

## 2022-05-03 NOTE — Progress Notes (Signed)
Bilateral lower extremity venous duplex has been completed. Preliminary results can be found in CV Proc through chart review.   05/03/22 2:58 PM Carlos Levering RVT

## 2022-05-03 NOTE — Consult Note (Addendum)
Renal Service Consult Note [facility]  BRAYLYN KALTER 05/03/2022 Sol Blazing, MD Requesting Physician: Dr. Erlinda Hong  Reason for Consult: Hyponatremia  HPI: The patient is a 76 y.o. year-old w/ PMH as below who presented to ED today w/ c/o gen'd weakness and falls x 4 in the last several days. Had his last chemo and radiation in the last 2 wks for H&N cancer. Lots of orthostatic symptoms. +cough w/ brown sputum, no fever. Has PEG tube in placed and is tolerating tube feeds w/o vomiting. Has had some hallucinations as well. In ED the Na+ was 114, creat 0.69, BUN 25.  He rec'd IV bolus of NS 500 cc and IVF at 50 cc/hr. Also rec'd 2 gm IV Mg and IV rocephin. We are asked to see for hyponatremia.   Pt seen in room. Says his ankles are swelling which is new, but the wife says that happens at times if he is on his feet a lot.  Some cough lying flat. No n/v or diarrhea. Lots of lightheadedness w/ standing.     ROS - denies CP, no joint pain, no HA, no blurry vision, no rash, no diarrhea, no nausea/ vomiting, no dysuria, no difficulty voiding   Past Medical History  Past Medical History:  Diagnosis Date   Abdominal hernia    Midline   Adrenal incidentaloma ([place]) 2010   Aortic stenosis    Ascending aortic aneurysm ([place])    a. 4.4cm by imaging 03/2020.   CAD (coronary artery disease)    a. Cath 03/2018 - CTO of the prox-mid LAD with bridging, L-L and R-L collaterals, moderate, non-obstructive disease involving the proximal LAD, proximal LCx, and distal RCA, more severe disease noted involving small branch vessels (RV marginal and superior branch of OM3).   Complete heart block (HCC)    Gallstones    Heart murmur    History of pacemaker    a. 11/2018 - [employer], intermittent CHB.   Hyperlipidemia    Morbid obesity (HCC)    Myocardial infarction [facility])    NSVT (nonsustained ventricular tachycardia) (HCC)    OSA (obstructive sleep apnea)    Severe PSG 1/07 AHI 66/hr, O2 Nadir  50% Refuses treatment - Pt does not believe test was accurate   Pericarditis    Pneumonia    Pulmonary HTN (HCC)    mild with PASP 70mHg by echo 02/2018, not seen on 03/2020 echo   Pulmonary nodule    Screening for AAA (abdominal aortic aneurysm) 2010   CT Abd/Pelvis   Ventral hernia    03/2009   Past Surgical History  Past Surgical History:  Procedure Laterality Date   CHOLECYSTECTOMY N/A 10/11/2019   Procedure: LAPAROSCOPIC CHOLECYSTECTOMY WITH INTRAOPERATIVE CHOLANGIOGRAM;  Surgeon: GArmandina Gemma MD;  Location: [facility];  Service: General;  Laterality: N/A;   CORONARY ANGIOGRAPHY N/A 03/23/2018   Procedure: CORONARY ANGIOGRAPHY;  Surgeon: ENelva Bush MD;  [facility];  Service: Cardiovascular;  Laterality: N/A;   HERNIA REPAIR  05/2002   three   IR GASTROSTOMY TUBE MOD SED  03/08/2022   IR GJ TUBE CHANGE  04/15/2022   IR IMAGING GUIDED PORT INSERTION  03/08/2022   KNEE ARTHROSCOPY Left 09/2002   PACEMAKER IMPLANT N/A 11/08/2018   Procedure: PACEMAKER IMPLANT;  Surgeon: CConstance Haw MD;  [place] [facility];  Service: Cardiovascular;  Laterality: N/A;   SHOULDER SURGERY Right 2020   TOTAL KNEE ARTHROPLASTY Bilateral 09/2010   VENTRAL HERNIA REPAIR  03/2009   Family History  Family History  Problem Relation Age of Onset   Heart attack Father    Heart disease Father    Aneurysm Father    Breast cancer Sister    Colon cancer Sister    Social History  reports that he quit smoking about 31 years ago. His smoking use included cigarettes. He smoked an average of .5 packs per day. He has never used smokeless tobacco. He reports that he does not drink alcohol and does not use drugs. Allergies  Allergies  Allergen Reactions   Sertraline Hcl Other (See Comments)    Tremor, HA, Decreased Libido   Transderm-Scop [Scopolamine] Other (See Comments)    Tremors, hallucinations, weakness   Home medications Prior to Admission medications    Medication Sig Start Date End Date Taking? Authorizing Provider  acetaminophen (TYLENOL) 500 MG tablet Place 1,000 mg into feeding tube every 6 (six) hours as needed (for pain).   Yes [provider]  aspirin EC 81 MG tablet 81 mg See admin instructions. 81 mg, per tube, once a day   Yes [provider]  buPROPion (WELLBUTRIN XL) 150 MG 24 hr tablet 150 mg See admin instructions. 150 mg, per tube, every morning 01/18/21  Yes [provider]  feeding supplement, ENSURE COMPLETE, (ENSURE COMPLETE) LIQD Place 237 mLs into feeding tube See admin instructions. Mix 237 ml's of Ensure Complete with 237 ml's of milk = 3 feedings a day   Yes [provider]  isosorbide mononitrate (IMDUR) 30 MG 24 hr tablet TAKE 1 TABLET ONCE DAILY. Patient taking differently: 30 mg See admin instructions. 30 mg, per tube, once a day 03/01/21  Yes Dunn, Dayna N, PA-C  Multiple Vitamin (MULTIVITAMIN) tablet Place 1 tablet into feeding tube daily.   Yes [provider]  nitroGLYCERIN (NITROSTAT) 0.4 MG SL tablet Place 1 tablet (0.4 mg total) under the tongue every 5 (five) minutes as needed for chest pain. 03/19/18  Yes Nahser, Wonda Cheng, MD  Nutritional Supplements (ISOSOURCE) LIQD Place into feeding tube See admin instructions. Three feedings a day   Yes [provider]  rosuvastatin (CRESTOR) 10 MG tablet TAKE 1 TABLET ONCE DAILY. Patient taking differently: Place 10 mg into feeding tube daily. 02/10/21  Yes Dunn, Dayna N, PA-C  temazepam (RESTORIL) 15 MG capsule Take 1 capsule (15 mg total) by mouth at bedtime as needed for sleep. Patient taking differently: Place 15 mg into feeding tube at bedtime. 04/18/22  Yes [name], MD  COVID-19 mRNA bivalent vaccine, Pfizer, (PFIZER COVID-19 VAC BIVALENT) injection Inject into the muscle. Patient not taking: Reported on 05/03/2022 04/09/21   Carlyle Basques, MD  COVID-19 mRNA Vac-TriS, Pfizer, (PFIZER-BIONT COVID-19 VAC-TRIS)  SUSP injection Inject into the muscle. Patient not taking: Reported on 05/03/2022 10/27/20   Carlyle Basques, MD  COVID-19 mRNA vaccine [phone] (COMIRNATY) syringe Inject into the muscle. Patient not taking: Reported on 05/03/2022 02/22/22   Carlyle Basques, MD  influenza vaccine adjuvanted (FLUAD) 0.5 ML injection Inject into the muscle. Patient not taking: Reported on 05/03/2022 01/28/22   Carlyle Basques, MD  metoprolol succinate (TOPROL-XL) 25 MG 24 hr tablet Take 0.5 tablets (12.5 mg total) by mouth daily. Patient not taking: Reported on 05/03/2022 02/17/22   Sueanne Margarita, MD  Protein (FEEDING SUPPLEMENT, PROSOURCE TF20,) liquid Place 60 mLs into feeding tube daily. Patient not taking: Reported on 05/03/2022 04/06/22   Benay Pike, MD  RSV vaccine recomb adjuvanted (AREXVY) 120 MCG/0.5ML injection Inject into  the muscle. Patient not taking: Reported on 05/03/2022 01/21/22   Carlyle Basques, MD  scopolamine (TRANSDERM-SCOP) 1 MG/3DAYS Place 1 patch (1.5 mg total) onto the skin every 3 (three) days. Patient not taking: Reported on 05/03/2022 04/25/22   [name], MD  tamsulosin (FLOMAX) 0.4 MG CAPS capsule Take 0.4 mg by mouth daily. Patient not taking: Reported on 05/03/2022 03/03/22   [provider]     Vitals:   05/03/22 1222 05/03/22 1611 05/03/22 1615 05/03/22 1635  BP: (!) 109/54 111/73 120/63   Pulse: 99 92 90   Resp: (!) '21 19 15   '$ Temp:  98.6 F (37 C)  98.3 F (36.8 C)  TempSrc:  Oral    SpO2: 97% 96% 93%   Weight:      Height:       Exam Gen alert, no distress, elderly pleasant male No rash, cyanosis or gangrene Sclera anicteric, throat clear  No jvd , JVP about 5-9 cm Chest clear bilat to bases, no rales/ wheezing RRR no MRG Abd soft ntnd no mass or ascites +bs GU normal male MS no joint effusions or deformity Ext 1+ bilat pretib edema, no wounds or ulcers Neuro is alert, Ox 3 , nf   Home meds include - asa 81, MVI, sl ntg, crestor,  restoril, toprol xl 12.5 qd, prosource qd, flomax, prns/ vits/ supps     BP's normal 110- 125/ 63- 86    Orthostatics >> 103/92 HR 90  lying        113/63 HR 102 sitting         107/58 HR 107 standing 0 min          109/54 HR 106 standing 3 min      RA 97%, HR 90s- 100s, RR 15-21  afeb     Na 114  K 4.9  CO2 28  BUn 23  Creat 0.69      Ca 8.7  alb 3.1  AST 62/ aLT 39  Tbili 0.8   BNP 181       Trop 21, 18    WBC 5K  Hb 10    UA many bact, >50 wbc, 0-5 rbc/ epis    UNa, UCr, UOsm pending     CXR - no active disease    Assessment/ Plan: Hyponatremia - in patient w/ head & neck cancer, recently completed his chemo and radiation and is having lightheadedness and falls at home. Has a PEG tube. Having hallucinations too. Has some LE edema but clear lungs on exam and CXR. UNa , UOsm are pending. Would treat this 1st as vol depletion w/ all his orthostatic symptoms. Will ^IVF to NS 0.9% at 75 cc/hr and bolus another 500 cc NS. Get Na every 3 hrs. If not improving or getting worse w/ this approach, would lean towards using SIADH therapy next w/ 3% saline.  Head & neck cancer - has PEG tube, finished chemo / XRT recently CHB - sp PPM Morbid obesity OSA Hx of pHTN      Rob Erikka Follmer  MD 05/03/2022, 5:15 PM Recent Labs  Lab 05/03/22 1040 05/03/22 1452  HGB 10.1*  --   ALBUMIN 3.1*  --   CALCIUM 8.7*  --   PHOS  --  3.2  CREATININE 0.69  --   K 4.9  --    Inpatient medications:  aspirin EC  81 mg Oral Daily   isosorbide mononitrate  15 mg Oral Daily   multivitamin with minerals  1 tablet Oral Daily   rosuvastatin  10 mg Oral Daily   tamsulosin  0.4 mg Oral Daily    sodium chloride     nitroGLYCERIN, temazepam

## 2022-05-03 NOTE — ED Provider Notes (Signed)
[facility] DEPT Provider Note   CSN: [id number] Arrival date & time: 05/03/22  5573     History  Chief Complaint  Patient presents with   Weakness    Clarence Andrade is a 76 y.o. male.  Patient with a history of squamous cancer of the oropharynx, complete heart block status post pacemaker, ascending aortic aneurysm, CAD, aortic stenosis, pulmonary hypertension presenting from home with recurrent falls.  States he is fallen 4 times in the past 4 days secondary to generalized weakness and dizziness.  Has hit his head but not lost consciousness.  Takes aspirin but no other blood thinners.  Denies any injury from the fall.  Denies actually losing consciousness.  States he feels generally weak and lightheaded with room spinning dizziness that comes and goes.  Does not believe he is actually passed out.  No chest pain or shortness of breath.  Has noticed a cough productive of brown sputum.  No known fever.  Did complete chemo and radiation about 1 week ago.  He has a PEG tube in place and is tolerating his feeds without vomiting or diarrhea.  He denies any pain with urination or blood in the urine.  Denies any head, neck, back, chest or abdominal pain.  No focal weakness, numbness or tingling.  No blood thinner use other than aspirin.  He was recently started on scopolamine patch for increased oral secretions which she is not wearing currently.  The history is provided by the patient and the EMS personnel.  Weakness Associated symptoms: cough and dizziness   Associated symptoms: no abdominal pain, no arthralgias, no chest pain, no fever, no myalgias, no nausea and no vomiting        Home Medications Prior to Admission medications   Medication Sig Start Date End Date Taking? Authorizing Provider  acetaminophen (TYLENOL) 500 MG tablet Take 1,000 mg by mouth every 6 (six) hours as needed (pain).    [provider]  aspirin EC 81 MG tablet Take 81 mg by  mouth daily.    [provider]  buPROPion (WELLBUTRIN XL) 150 MG 24 hr tablet Take 150 mg by mouth every morning. 01/18/21   [provider]  COVID-19 mRNA bivalent vaccine, Pfizer, (PFIZER COVID-19 VAC BIVALENT) injection Inject into the muscle. 04/09/21   Carlyle Basques, MD  COVID-19 mRNA Vac-TriS, Pfizer, (PFIZER-BIONT COVID-19 VAC-TRIS) SUSP injection Inject into the muscle. 10/27/20   Carlyle Basques, MD  COVID-19 mRNA vaccine [phone] (COMIRNATY) syringe Inject into the muscle. 02/22/22   Carlyle Basques, MD  influenza vaccine adjuvanted (FLUAD) 0.5 ML injection Inject into the muscle. 01/28/22   Carlyle Basques, MD  isosorbide mononitrate (IMDUR) 30 MG 24 hr tablet TAKE 1 TABLET ONCE DAILY. 03/01/21   Dunn, Nedra Hai, PA-C  metoprolol succinate (TOPROL-XL) 25 MG 24 hr tablet Take 0.5 tablets (12.5 mg total) by mouth daily. 02/17/22   Sueanne Margarita, MD  Multiple Vitamin (MULTIVITAMIN) tablet Take 1 tablet by mouth daily.    [provider]  nitroGLYCERIN (NITROSTAT) 0.4 MG SL tablet Place 1 tablet (0.4 mg total) under the tongue every 5 (five) minutes as needed for chest pain. Patient not taking: Reported on 03/29/2022 03/19/18   Nahser, Wonda Cheng, MD  Nutritional Supplements (FEEDING SUPPLEMENT, OSMOLITE 1.5 CAL,) LIQD 7 cartons Osmolite 1.5/equivalent split over four feedings. Flush tube with 90 ml water before and after each feeding. Drink by mouth or give via tube additional 474 ml (2 c.) water. Provides 1659 ml/day  2485 kcal, 104 g protein, 1267 ml free water (2461 ml total water) Meets 100% RDI 04/06/22   Benay Pike, MD  Protein (FEEDING SUPPLEMENT, PROSOURCE TF20,) liquid Place 60 mLs into feeding tube daily. 04/06/22   Benay Pike, MD  rosuvastatin (CRESTOR) 10 MG tablet TAKE 1 TABLET ONCE DAILY. 02/10/21   Dunn, Nedra Hai, PA-C  RSV vaccine recomb adjuvanted (AREXVY) 120 MCG/0.5ML injection Inject into the muscle. 01/21/22   Carlyle Basques, MD  scopolamine  (TRANSDERM-SCOP) 1 MG/3DAYS Place 1 patch (1.5 mg total) onto the skin every 3 (three) days. 04/25/22   [name], MD  tamsulosin (FLOMAX) 0.4 MG CAPS capsule Take 0.4 mg by mouth daily. 03/03/22   [provider]  temazepam (RESTORIL) 15 MG capsule Take 1 capsule (15 mg total) by mouth at bedtime as needed for sleep. 04/18/22   [name], MD      Allergies    Sertraline hcl    Review of Systems   Review of Systems  Constitutional:  Positive for activity change, appetite change and fatigue. Negative for fever.  HENT:  Positive for congestion.   Respiratory:  Positive for cough. Negative for chest tightness.   Cardiovascular:  Negative for chest pain.  Gastrointestinal:  Negative for abdominal pain, nausea and vomiting.  Genitourinary:  Negative for enuresis and hematuria.  Musculoskeletal:  Negative for arthralgias and myalgias.  Skin:  Negative for rash.  Neurological:  Positive for dizziness and weakness.   all other systems are negative except as noted in the HPI and PMH.    Physical Exam Updated Vital Signs BP (!) 109/54 (BP Location: Right Arm)   Pulse 99   Temp (!) 97.5 F (36.4 C)   Resp (!) 21   Ht 6' (1.829 m)   Wt 121.6 kg   SpO2 97%   BMI 36.35 kg/m  Physical Exam Vitals and nursing note reviewed.  Constitutional:      General: He is not in acute distress.    Appearance: He is well-developed.     Comments: Moist cough, no increased work of breathing  HENT:     Head: Normocephalic and atraumatic.     Mouth/Throat:     Pharynx: No oropharyngeal exudate.  Eyes:     Conjunctiva/sclera: Conjunctivae normal.     Pupils: Pupils are equal, round, and reactive to light.  Neck:     Comments: No meningismus. Cardiovascular:     Rate and Rhythm: Normal rate and regular rhythm.     Heart sounds: Normal heart sounds. No murmur heard. Pulmonary:     Effort: Pulmonary effort is normal. No respiratory distress.     Breath sounds: Normal breath sounds.   Chest:     Chest wall: No tenderness.  Abdominal:     Palpations: Abdomen is soft.     Tenderness: There is no abdominal tenderness. There is no guarding or rebound.  Musculoskeletal:        General: No tenderness. Normal range of motion.     Cervical back: Normal range of motion and neck supple.  Skin:    General: Skin is warm.  Neurological:     Mental Status: He is alert and oriented to person, place, and time.     Cranial Nerves: No cranial nerve deficit.     Motor: No abnormal muscle tone.     Coordination: Coordination normal.     Comments:  5/5 strength throughout. CN 2-12 intact.Equal grip strength.   Psychiatric:  Behavior: Behavior normal.     ED Results / Procedures / Treatments   Labs (all labs ordered are listed, but only abnormal results are displayed) Labs Reviewed  CBC WITH DIFFERENTIAL/PLATELET - Abnormal; Notable for the following components:      Result Value   RBC 3.08 (*)    Hemoglobin 10.1 (*)    HCT 27.3 (*)    MCHC 37.0 (*)    RDW 17.4 (*)    Lymphs Abs 0.4 (*)    Monocytes Absolute 1.3 (*)    Abs Immature Granulocytes 0.10 (*)    All other components within normal limits  COMPREHENSIVE METABOLIC PANEL - Abnormal; Notable for the following components:   Sodium 114 (*)    Chloride 76 (*)    Glucose, Bld 108 (*)    Calcium 8.7 (*)    Total Protein 6.3 (*)    Albumin 3.1 (*)    AST 62 (*)    All other components within normal limits  BRAIN NATRIURETIC PEPTIDE - Abnormal; Notable for the following components:   B Natriuretic Peptide 181.7 (*)    All other components within normal limits  URINALYSIS, ROUTINE W REFLEX MICROSCOPIC - Abnormal; Notable for the following components:   Hgb urine dipstick SMALL (*)    Ketones, ur 5 (*)    Leukocytes,Ua MODERATE (*)    WBC, UA >50 (*)    Bacteria, UA MANY (*)    Non Squamous Epithelial 0-5 (*)    All other components within normal limits  TROPONIN I (HIGH SENSITIVITY) - Abnormal; Notable for  the following components:   Troponin I (High Sensitivity) 21 (*)    All other components within normal limits  TROPONIN I (HIGH SENSITIVITY) - Abnormal; Notable for the following components:   Troponin I (High Sensitivity) 18 (*)    All other components within normal limits  RESP PANEL BY RT-PCR (RSV, FLU A&B, COVID)  [id number]  CULTURE, BLOOD (ROUTINE X 2)  CULTURE, BLOOD (ROUTINE X 2)  URINE CULTURE  LACTIC ACID, PLASMA  LIPASE, BLOOD  LACTIC ACID, PLASMA  SODIUM, URINE, RANDOM  OSMOLALITY, URINE  URIC ACID  SODIUM  SODIUM  SODIUM  SODIUM  SODIUM  MAGNESIUM  PHOSPHORUS  OSMOLALITY    EKG EKG Interpretation  Date/Time:  Tuesday May 03 2022 11:24:07 EST Ventricular Rate:  90 PR Interval:  263 QRS Duration: 88 QT Interval:  340 QTC Calculation: 416 R Axis:   -64 Text Interpretation: Sinus rhythm Prolonged PR interval Left anterior fascicular block Abnormal R-wave progression, late transition No significant change was found Confirmed by Ezequiel Essex [phone]) on 05/03/2022 11:31:06 AM  Radiology CT Head Wo Contrast  Result Date: 05/03/2022 CLINICAL DATA:  Head trauma, moderate-severe; Neck trauma (Age >= 65y). Multiple recent falls. Currently undergoing treatment for oropharyngeal cancer. EXAM: CT HEAD WITHOUT CONTRAST CT CERVICAL SPINE WITHOUT CONTRAST TECHNIQUE: Multidetector CT imaging of the head and cervical spine was performed following the standard protocol without intravenous contrast. Multiplanar CT image reconstructions of the cervical spine were also generated. RADIATION DOSE REDUCTION: This exam was performed according to the departmental dose-optimization program which includes automated exposure control, adjustment of the mA and/or kV according to patient size and/or use of iterative reconstruction technique. COMPARISON:  Neck CT 02/02/2022 FINDINGS: CT HEAD FINDINGS Brain: There is no evidence of an acute infarct, intracranial hemorrhage, mass, midline  shift, or extra-axial fluid collection. The ventricles and sulci are within normal limits for age. Vascular: Calcified atherosclerosis at the skull base.  No hyperdense vessel. Skull: No acute fracture or suspicious osseous lesion. Sinuses/Orbits: Mild right ethmoid air cell mucosal thickening. Clear mastoid air cells. Unremarkable orbits. Other: None. CT CERVICAL SPINE FINDINGS Alignment: Normal. Skull base and vertebrae: No acute fracture or suspicious osseous lesion. Soft tissues and spinal canal: No prevertebral fluid or swelling. No visible canal hematoma. Disc levels: Moderate cervical spondylosis and mild-to-moderate facet arthrosis. Multilevel neural foraminal stenosis due to uncovertebral spurring, moderate to severe on the right greater than left at C5-6, left greater than right at C6-7, and on the right at C7-T1. Upper chest: Clear lung apices. Other: Post treatment changes in the neck with mucosal edema. Decreased size of the left level II nodal mass and decreased size or possible resolution of the left-sided oropharyngeal mass, not primarily evaluated on this study. IMPRESSION: No evidence of acute intracranial abnormality or cervical spine fracture. Electronically Signed   By: Logan Bores M.D.   On: 05/03/2022 10:38   CT Cervical Spine Wo Contrast  Result Date: 05/03/2022 CLINICAL DATA:  Head trauma, moderate-severe; Neck trauma (Age >= 65y). Multiple recent falls. Currently undergoing treatment for oropharyngeal cancer. EXAM: CT HEAD WITHOUT CONTRAST CT CERVICAL SPINE WITHOUT CONTRAST TECHNIQUE: Multidetector CT imaging of the head and cervical spine was performed following the standard protocol without intravenous contrast. Multiplanar CT image reconstructions of the cervical spine were also generated. RADIATION DOSE REDUCTION: This exam was performed according to the departmental dose-optimization program which includes automated exposure control, adjustment of the mA and/or kV according to  patient size and/or use of iterative reconstruction technique. COMPARISON:  Neck CT 02/02/2022 FINDINGS: CT HEAD FINDINGS Brain: There is no evidence of an acute infarct, intracranial hemorrhage, mass, midline shift, or extra-axial fluid collection. The ventricles and sulci are within normal limits for age. Vascular: Calcified atherosclerosis at the skull base. No hyperdense vessel. Skull: No acute fracture or suspicious osseous lesion. Sinuses/Orbits: Mild right ethmoid air cell mucosal thickening. Clear mastoid air cells. Unremarkable orbits. Other: None. CT CERVICAL SPINE FINDINGS Alignment: Normal. Skull base and vertebrae: No acute fracture or suspicious osseous lesion. Soft tissues and spinal canal: No prevertebral fluid or swelling. No visible canal hematoma. Disc levels: Moderate cervical spondylosis and mild-to-moderate facet arthrosis. Multilevel neural foraminal stenosis due to uncovertebral spurring, moderate to severe on the right greater than left at C5-6, left greater than right at C6-7, and on the right at C7-T1. Upper chest: Clear lung apices. Other: Post treatment changes in the neck with mucosal edema. Decreased size of the left level II nodal mass and decreased size or possible resolution of the left-sided oropharyngeal mass, not primarily evaluated on this study. IMPRESSION: No evidence of acute intracranial abnormality or cervical spine fracture. Electronically Signed   By: Logan Bores M.D.   On: 05/03/2022 10:38   DG Chest 2 View  Result Date: 05/03/2022 CLINICAL DATA:  Cough, weakness EXAM: CHEST - 2 VIEW COMPARISON:  11/08/2018 FINDINGS: Right IJ approach chest port with distal tip terminating the level of the distal SVC. Left-sided implanted cardiac device remains in place. Cardiomegaly. No focal airspace consolidation, pleural effusion, or pneumothorax. IMPRESSION: No active cardiopulmonary disease. Electronically Signed   By: Davina Poke D.O.   On: 05/03/2022 10:25     Procedures .Critical Care  Performed by: Ezequiel Essex, MD Authorized by: Ezequiel Essex, MD   Critical care provider statement:    Critical care time (minutes):  45   Critical care time was exclusive of:  Separately billable procedures and treating other  patients   Critical care was necessary to treat or prevent imminent or life-threatening deterioration of the following conditions:  Metabolic crisis (hyponatremia)   Critical care was time spent personally by me on the following activities:  Development of treatment plan with patient or surrogate, discussions with consultants, evaluation of patient's response to treatment, examination of patient, ordering and review of laboratory studies, ordering and review of radiographic studies, ordering and performing treatments and interventions, pulse oximetry, re-evaluation of patient's condition and review of old charts   I assumed direction of critical care for this patient from another provider in my specialty: no     Care discussed with: admitting provider       Medications Ordered in ED Medications  sodium chloride 0.9 % bolus 500 mL (has no administration in time range)    ED Course/ Medical Decision Making/ A&P                           Medical Decision Making Amount and/or Complexity of Data Reviewed Labs: ordered. Decision-making details documented in ED Course. Radiology: ordered and independent interpretation performed. Decision-making details documented in ED Course. ECG/medicine tests: ordered and independent interpretation performed. Decision-making details documented in ED Course.  Risk Decision regarding hospitalization.  Recurrent falls x 4 over the past 4 days with generalized weakness and dizziness and lightheadedness.  Recently started on scopolamine patch.  Nonfocal neurological exam on arrival.  Patient given gentle IV hydration.  Will work is obtained that shows stable hemoglobin and creatinine.  Sodium 114  which is likely the source of his dizziness and weakness and recurrent falls.  Discussed with staff the scopolamine patch.  CT head and C-spine are negative for traumatic injury. Found to have urinary tract infection as well initiated IV antibiotics.  His temperature is on the low side at 97.5 rectally.  Patient symptoms likely secondary to his hyponatremia of 114.  His scopolamine patch is removed.  Medic imaging is reassuringly negative.  Is hypothermic at 97.5 and bear hugger is placed.  Blood culture sent with concern for possible occult sepsis and urinalysis is positive for UTI.  Status remains at baseline.  Patient oriented to person and place.  Denies any pain.  Admission discussed with Dr. Erlinda Hong.         Final Clinical Impression(s) / ED Diagnoses Final diagnoses:  Hyponatremia  Recurrent falls    Rx / DC Orders ED Discharge Orders     None         Emet Rafanan, Annie Main, MD 05/03/22 1556

## 2022-05-03 NOTE — ED Notes (Signed)
ED TO INPATIENT HANDOFF REPORT  ED Nurse Name and Phone #: Danise Edge Name/Age/Gender Clarence Andrade 76 y.o. male Room/Bed: WA15/WA15  Code Status   Code Status: Prior  Home/SNF/Other Home Patient oriented to: self, place, time, and situation Is this baseline? Yes   Triage Complete: Triage complete  Chief Complaint Hypokalemia [E87.6]  Triage Note EMS states that he started a new medication a week ago and its caused him to be weaker and has fallen 4X in the last 4 days. He had chjemo and radiation 2 weeks ago.  BP 142/88 HR 90 O2 93 RA     Allergies Allergies  Allergen Reactions   Sertraline Hcl Other (See Comments)    Tremor, HA, Decreased Libido   Transderm-Scop [Scopolamine] Other (See Comments)    Tremors, hallucinations, weakness    Level of Care/Admitting Diagnosis ED Disposition     ED Disposition  Admit   Condition  --   Comment  Hospital Area: [place] [[id number]]  Level of Care: Telemetry [5]  Admit to tele based on following criteria: Monitor QTC interval  May admit patient to [facility] or [facility] if equivalent level of care is available:: No  Covid Evaluation: Confirmed COVID Negative  Diagnosis: Hypokalemia [254270]  Admitting Physician: Florencia Reasons [[id number]]  Attending Physician: Florencia Reasons [[id number]]  Certification:: I certify this patient will need inpatient services for at least 2 midnights  Estimated Length of Stay: 2          B Medical/Surgery History Past Medical History:  Diagnosis Date   Abdominal hernia    Midline   Adrenal incidentaloma ([place]) 2010   Aortic stenosis    Ascending aortic aneurysm ([place])    a. 4.4cm by imaging 03/2020.   CAD (coronary artery disease)    a. Cath 03/2018 - CTO of the prox-mid LAD with bridging, L-L and R-L collaterals, moderate, non-obstructive disease involving the proximal LAD, proximal LCx, and distal RCA, more severe disease noted involving small branch vessels (RV  marginal and superior branch of OM3).   Complete heart block (HCC)    Gallstones    Heart murmur    History of pacemaker    a. 11/2018 - [employer], intermittent CHB.   Hyperlipidemia    Morbid obesity (HCC)    Myocardial infarction [facility]    NSVT (nonsustained ventricular tachycardia) (HCC)    OSA (obstructive sleep apnea)    Severe PSG 1/07 AHI 66/hr, O2 Nadir 50% Refuses treatment - Pt does not believe test was accurate   Pericarditis    Pneumonia    Pulmonary HTN (HCC)    mild with PASP 53mHg by echo 02/2018, not seen on 03/2020 echo   Pulmonary nodule    Screening for AAA (abdominal aortic aneurysm) 2010   CT Abd/Pelvis   Ventral hernia    03/2009   Past Surgical History:  Procedure Laterality Date   CHOLECYSTECTOMY N/A 10/11/2019   Procedure: LAPAROSCOPIC CHOLECYSTECTOMY WITH INTRAOPERATIVE CHOLANGIOGRAM;  Surgeon: GArmandina Gemma MD;  Location: [facility];  Service: General;  Laterality: N/A;   CORONARY ANGIOGRAPHY N/A 03/23/2018   Procedure: CORONARY ANGIOGRAPHY;  Surgeon: ENelva Bush MD;  [facility];  Service: Cardiovascular;  Laterality: N/A;   HERNIA REPAIR  05/2002   three   IR GASTROSTOMY TUBE MOD SED  03/08/2022   IR GJ TUBE CHANGE  04/15/2022   IR IMAGING GUIDED PORT INSERTION  03/08/2022   KNEE ARTHROSCOPY Left 09/2002   PACEMAKER IMPLANT N/A  11/08/2018   Procedure: PACEMAKER IMPLANT;  Surgeon: Constance Haw, MD;  Location: [facility];  Service: Cardiovascular;  Laterality: N/A;   SHOULDER SURGERY Right 2020   TOTAL KNEE ARTHROPLASTY Bilateral 09/2010   VENTRAL HERNIA REPAIR  03/2009     A IV Location/Drains/Wounds Patient Lines/Drains/Airways Status     Active Line/Drains/Airways     Name Placement date Placement time Site Days   Implanted Port 03/08/22 Right Chest 03/08/22  1240  Chest  56   Gastrostomy/Enterostomy Gastrostomy 18 Fr. LUQ 04/15/22  1630  LUQ  18   Incision (Closed) 10/11/19 Abdomen Other (Comment)  10/11/19  0816  -- 935   Incision - 4 Ports Abdomen Right;Lateral Right;Medial Umbilicus Left;Upper 02/72/53  0815  -- 935            Intake/Output Last 24 hours No intake or output data in the 24 hours ending 05/03/22 1641  Labs/Imaging Results for orders placed or performed during the hospital encounter of 05/03/22 (from the past 48 hour(s))  Urinalysis, Routine w reflex microscopic     Status: Abnormal   Collection Time: 05/03/22  9:45 AM  Result Value Ref Range   Color, Urine YELLOW YELLOW   APPearance CLEAR CLEAR   Specific Gravity, Urine 1.020 1.005 - 1.030   pH 6.0 5.0 - 8.0   Glucose, UA NEGATIVE NEGATIVE mg/dL   Hgb urine dipstick SMALL (A) NEGATIVE   Bilirubin Urine NEGATIVE NEGATIVE   Ketones, ur 5 (A) NEGATIVE mg/dL   Protein, ur NEGATIVE NEGATIVE mg/dL   Nitrite NEGATIVE NEGATIVE   Leukocytes,Ua MODERATE (A) NEGATIVE   RBC / HPF 0-5 0 - 5 RBC/hpf   WBC, UA >50 (H) 0 - 5 WBC/hpf   Bacteria, UA MANY (A) NONE SEEN   Squamous Epithelial / LPF 0-5 0 - 5   Non Squamous Epithelial 0-5 (A) NONE SEEN    Comment: Performed at [facility], [place] [address] [place],  [address]  Resp panel by RT-PCR (RSV, Flu A&B, Covid) Peripheral     Status: None   Collection Time: 05/03/22  9:50 AM   Specimen: Peripheral; Nasal Swab  Result Value Ref Range   SARS Coronavirus 2 by RT PCR NEGATIVE NEGATIVE    Comment: (NOTE) SARS-CoV-2 target nucleic acids are NOT DETECTED.  The SARS-CoV-2 RNA is generally detectable in upper respiratory specimens during the acute phase of infection. The lowest concentration of SARS-CoV-2 viral copies this assay can detect is 138 copies/mL. A negative result does not preclude SARS-Cov-2 infection and should not be used as the sole basis for treatment or other patient management decisions. A negative result may occur with  improper specimen collection/handling, submission of specimen other than nasopharyngeal swab, presence  of viral mutation(s) within the areas targeted by this assay, and inadequate number of viral copies(<138 copies/mL). A negative result must be combined with clinical observations, patient history, and epidemiological information. The expected result is Negative.  Fact Sheet for Patients:  EntrepreneurPulse.com.au  Fact Sheet for Healthcare Providers:  IncredibleEmployment.be  This test is no t yet approved or cleared by the [place] FDA and  has been authorized for detection and/or diagnosis of SARS-CoV-2 by FDA under an Emergency Use Authorization (EUA). This EUA will remain  in effect (meaning this test can be used) for the duration of the COVID-19 declaration under Section 564(b)(1) of the Act, 21 U.S.C.section 360bbb-3(b)(1), unless the authorization is terminated  or revoked sooner.  Influenza A by PCR NEGATIVE NEGATIVE   Influenza B by PCR NEGATIVE NEGATIVE    Comment: (NOTE) The Xpert Xpress SARS-CoV-2/FLU/RSV plus assay is intended as an aid in the diagnosis of influenza from Nasopharyngeal swab specimens and should not be used as a sole basis for treatment. Nasal washings and aspirates are unacceptable for Xpert Xpress SARS-CoV-2/FLU/RSV testing.  Fact Sheet for Patients: EntrepreneurPulse.com.au  Fact Sheet for Healthcare Providers: IncredibleEmployment.be  This test is not yet approved or cleared by the [place] FDA and has been authorized for detection and/or diagnosis of SARS-CoV-2 by FDA under an Emergency Use Authorization (EUA). This EUA will remain in effect (meaning this test can be used) for the duration of the COVID-19 declaration under Section 564(b)(1) of the Act, 21 U.S.C. section 360bbb-3(b)(1), unless the authorization is terminated or revoked.     Resp Syncytial Virus by PCR NEGATIVE NEGATIVE    Comment: (NOTE) Fact Sheet for  Patients: EntrepreneurPulse.com.au  Fact Sheet for Healthcare Providers: IncredibleEmployment.be  This test is not yet approved or cleared by the [place] FDA and has been authorized for detection and/or diagnosis of SARS-CoV-2 by FDA under an Emergency Use Authorization (EUA). This EUA will remain in effect (meaning this test can be used) for the duration of the COVID-19 declaration under Section 564(b)(1) of the Act, 21 U.S.C. section 360bbb-3(b)(1), unless the authorization is terminated or revoked.  Performed at [facility], [place] [address], [place],  [address]   CBC with Differential     Status: Abnormal   Collection Time: 05/03/22 10:40 AM  Result Value Ref Range   WBC 5.2 4.0 - 10.5 K/uL   RBC 3.08 (L) 4.22 - 5.81 MIL/uL   Hemoglobin 10.1 (L) 13.0 - 17.0 g/dL   HCT 27.3 (L) 39.0 - 52.0 %   MCV 88.6 80.0 - 100.0 fL   MCH 32.8 26.0 - 34.0 pg   MCHC 37.0 (H) 30.0 - 36.0 g/dL   RDW 17.4 (H) 11.5 - 15.5 %   Platelets 188 150 - 400 K/uL   nRBC 0.0 0.0 - 0.2 %   Neutrophils Relative % 66 %   Neutro Abs 3.4 1.7 - 7.7 K/uL   Lymphocytes Relative 8 %   Lymphs Abs 0.4 (L) 0.7 - 4.0 K/uL   Monocytes Relative 24 %   Monocytes Absolute 1.3 (H) 0.1 - 1.0 K/uL   Eosinophils Relative 0 %   Eosinophils Absolute 0.0 0.0 - 0.5 K/uL   Basophils Relative 0 %   Basophils Absolute 0.0 0.0 - 0.1 K/uL   Immature Granulocytes 2 %   Abs Immature Granulocytes 0.10 (H) 0.00 - 0.07 K/uL    Comment: Performed at [facility], [place] [address] [place],  [address]  Comprehensive metabolic panel     Status: Abnormal   Collection Time: 05/03/22 10:40 AM  Result Value Ref Range   Sodium 114 (LL) 135 - 145 mmol/L    Comment: CRITICAL RESULT CALLED TO, READ BACK BY AND VERIFIED WITH SAVOIE, B., RN @ 1225 ON 05/03/22 BY LJM    Potassium 4.9 3.5 - 5.1 mmol/L   Chloride 76 (L) 98 - 111 mmol/L   CO2 28 22 - 32  mmol/L   Glucose, Bld 108 (H) 70 - 99 mg/dL    Comment: Glucose reference range applies only to samples taken after fasting for at least 8 hours.   BUN 23 8 - 23 mg/dL   Creatinine, Ser 0.69 0.61 - 1.24 mg/dL  Calcium 8.7 (L) 8.9 - 10.3 mg/dL   Total Protein 6.3 (L) 6.5 - 8.1 g/dL   Albumin 3.1 (L) 3.5 - 5.0 g/dL   AST 62 (H) 15 - 41 U/L   ALT 39 0 - 44 U/L   Alkaline Phosphatase 57 38 - 126 U/L   Total Bilirubin 0.8 0.3 - 1.2 mg/dL   GFR, Estimated >60 >60 mL/min    Comment: (NOTE) Calculated using the CKD-EPI Creatinine Equation (2021)    Anion gap 10 5 - 15    Comment: Performed at [facility], [place] [address] [place], [place] [address]  Troponin I (High Sensitivity)     Status: Abnormal   Collection Time: 05/03/22 10:40 AM  Result Value Ref Range   Troponin I (High Sensitivity) 21 (H) <18 ng/L    Comment: (NOTE) Elevated high sensitivity troponin I (hsTnI) values and significant  changes across serial measurements may suggest ACS but many other  chronic and acute conditions are known to elevate hsTnI results.  Refer to the "Links" section for chest pain algorithms and additional  guidance. Performed at [facility], [place] [address], [place], [place] [address]   Lactic acid, plasma     Status: None   Collection Time: 05/03/22 10:40 AM  Result Value Ref Range   Lactic Acid, Venous 1.1 0.5 - 1.9 mmol/L    Comment: Performed at [facility], [place] [address] [place], [place] [address]  Brain natriuretic peptide     Status: Abnormal   Collection Time: 05/03/22 10:40 AM  Result Value Ref Range   B Natriuretic Peptide 181.7 (H) 0.0 - 100.0 pg/mL    Comment: Performed at [facility], [place] [address], [place], [place] [address]  Lipase, blood     Status: None   Collection Time: 05/03/22 10:40 AM  Result Value Ref Range   Lipase 31 11 - 51 U/L    Comment: Performed at [facility],  [place] [address] [place], [place] [address]  Troponin I (High Sensitivity)     Status: Abnormal   Collection Time: 05/03/22  1:08 PM  Result Value Ref Range   Troponin I (High Sensitivity) 18 (H) <18 ng/L    Comment: (NOTE) Elevated high sensitivity troponin I (hsTnI) values and significant  changes across serial measurements may suggest ACS but many other  chronic and acute conditions are known to elevate hsTnI results.  Refer to the "Links" section for chest pain algorithms and additional  guidance. Performed at [facility], [place] [address] [place], [place] [address]   Uric acid     Status: Abnormal   Collection Time: 05/03/22  2:52 PM  Result Value Ref Range   Uric Acid, Serum 2.6 (L) 3.7 - 8.6 mg/dL    Comment: Performed at [facility], [place] [address] [place], [place] [address]  Sodium     Status: Abnormal   Collection Time: 05/03/22  2:52 PM  Result Value Ref Range   Sodium 115 (LL) 135 - 145 mmol/L    Comment: CRITICAL RESULT CALLED TO, READ BACK BY AND VERIFIED WITH K.DANIEL, RN AT 1632 ON 12.26.23 BY N.THOMPSON Performed at [facility], [place] [address] [place], [place] [address]   Magnesium     Status: Abnormal   Collection Time: 05/03/22  2:52 PM  Result Value Ref Range   Magnesium 1.2 (L) 1.7 - 2.4 mg/dL    Comment: Performed at [facility]  [place] [address], [place], [place] [address]  Phosphorus     Status: None   Collection Time: 05/03/22  2:52 PM  Result Value Ref Range   Phosphorus 3.2 2.5 - 4.6 mg/dL    Comment: Performed at [facility], [place] [address], [place], [place] [address]   VAS [place] LOWER EXTREMITY VENOUS (DVT)  Result Date: 05/03/2022  Lower Venous DVT Study Patient Name:  Clarence Andrade  Date of Exam:   05/03/2022 Medical Rec #: [id number]        Accession #:    [id number] Date of Birth: [birth year]        Patient Gender: M Patient Age:   51 years Exam  Location:  [facility] Procedure:      VAS [place] LOWER EXTREMITY VENOUS (DVT) Referring Phys: Annamaria Boots XU --------------------------------------------------------------------------------  Indications: Edema.  Risk Factors: Cancer. Limitations: Poor ultrasound/tissue interface. Comparison Study: No prior studies. Performing Technologist: Oliver Hum RVT  Examination Guidelines: A complete evaluation includes B-mode imaging, spectral Doppler, color Doppler, and power Doppler as needed of all accessible portions of each vessel. Bilateral testing is considered an integral part of a complete examination. Limited examinations for reoccurring indications may be performed as noted. The reflux portion of the exam is performed with the patient in reverse Trendelenburg.  +---------+---------------+---------+-----------+----------+--------------+ RIGHT    CompressibilityPhasicitySpontaneityPropertiesThrombus Aging +---------+---------------+---------+-----------+----------+--------------+ CFV      Full           Yes      Yes                                 +---------+---------------+---------+-----------+----------+--------------+ SFJ      Full                                                        +---------+---------------+---------+-----------+----------+--------------+ FV Prox  Full                                                        +---------+---------------+---------+-----------+----------+--------------+ FV Mid   Full                                                        +---------+---------------+---------+-----------+----------+--------------+ FV DistalFull                                                        +---------+---------------+---------+-----------+----------+--------------+ PFV      Full                                                        +---------+---------------+---------+-----------+----------+--------------+ POP  Full           Yes       Yes                                 +---------+---------------+---------+-----------+----------+--------------+ PTV      Full                                                        +---------+---------------+---------+-----------+----------+--------------+ PERO     Full                                                        +---------+---------------+---------+-----------+----------+--------------+   +---------+---------------+---------+-----------+----------+-------------------+ LEFT     CompressibilityPhasicitySpontaneityPropertiesThrombus Aging      +---------+---------------+---------+-----------+----------+-------------------+ CFV      Full           Yes      Yes                                      +---------+---------------+---------+-----------+----------+-------------------+ SFJ      Full                                                             +---------+---------------+---------+-----------+----------+-------------------+ FV Prox  Full                                                             +---------+---------------+---------+-----------+----------+-------------------+ FV Mid   Full                                                             +---------+---------------+---------+-----------+----------+-------------------+ FV DistalFull                                                             +---------+---------------+---------+-----------+----------+-------------------+ PFV      Full                                                             +---------+---------------+---------+-----------+----------+-------------------+ POP      Full  Yes      Yes                                      +---------+---------------+---------+-----------+----------+-------------------+ PTV      Full                                                              +---------+---------------+---------+-----------+----------+-------------------+ PERO                                                  Not well visualized +---------+---------------+---------+-----------+----------+-------------------+     Summary: RIGHT: - There is no evidence of deep vein thrombosis in the lower extremity. However, portions of this examination were limited- see technologist comments above.  - No cystic structure found in the popliteal fossa.  LEFT: - There is no evidence of deep vein thrombosis in the lower extremity. However, portions of this examination were limited- see technologist comments above.  - No cystic structure found in the popliteal fossa.  *See table(s) above for measurements and observations. Electronically signed by Jamelle Haring on 05/03/2022 at 4:33:57 PM.    Final    CT Head Wo Contrast  Result Date: 05/03/2022 CLINICAL DATA:  Head trauma, moderate-severe; Neck trauma (Age >= 65y). Multiple recent falls. Currently undergoing treatment for oropharyngeal cancer. EXAM: CT HEAD WITHOUT CONTRAST CT CERVICAL SPINE WITHOUT CONTRAST TECHNIQUE: Multidetector CT imaging of the head and cervical spine was performed following the standard protocol without intravenous contrast. Multiplanar CT image reconstructions of the cervical spine were also generated. RADIATION DOSE REDUCTION: This exam was performed according to the departmental dose-optimization program which includes automated exposure control, adjustment of the mA and/or kV according to patient size and/or use of iterative reconstruction technique. COMPARISON:  Neck CT 02/02/2022 FINDINGS: CT HEAD FINDINGS Brain: There is no evidence of an acute infarct, intracranial hemorrhage, mass, midline shift, or extra-axial fluid collection. The ventricles and sulci are within normal limits for age. Vascular: Calcified atherosclerosis at the skull base. No hyperdense vessel. Skull: No acute fracture or suspicious osseous lesion.  Sinuses/Orbits: Mild right ethmoid air cell mucosal thickening. Clear mastoid air cells. Unremarkable orbits. Other: None. CT CERVICAL SPINE FINDINGS Alignment: Normal. Skull base and vertebrae: No acute fracture or suspicious osseous lesion. Soft tissues and spinal canal: No prevertebral fluid or swelling. No visible canal hematoma. Disc levels: Moderate cervical spondylosis and mild-to-moderate facet arthrosis. Multilevel neural foraminal stenosis due to uncovertebral spurring, moderate to severe on the right greater than left at C5-6, left greater than right at C6-7, and on the right at C7-T1. Upper chest: Clear lung apices. Other: Post treatment changes in the neck with mucosal edema. Decreased size of the left level II nodal mass and decreased size or possible resolution of the left-sided oropharyngeal mass, not primarily evaluated on this study. IMPRESSION: No evidence of acute intracranial abnormality or cervical spine fracture. Electronically Signed   By: Logan Bores M.D.   On: 05/03/2022 10:38   CT Cervical Spine Wo Contrast  Result Date: 05/03/2022 CLINICAL DATA:  Head trauma, moderate-severe; Neck trauma (Age >=  65y). Multiple recent falls. Currently undergoing treatment for oropharyngeal cancer. EXAM: CT HEAD WITHOUT CONTRAST CT CERVICAL SPINE WITHOUT CONTRAST TECHNIQUE: Multidetector CT imaging of the head and cervical spine was performed following the standard protocol without intravenous contrast. Multiplanar CT image reconstructions of the cervical spine were also generated. RADIATION DOSE REDUCTION: This exam was performed according to the departmental dose-optimization program which includes automated exposure control, adjustment of the mA and/or kV according to patient size and/or use of iterative reconstruction technique. COMPARISON:  Neck CT 02/02/2022 FINDINGS: CT HEAD FINDINGS Brain: There is no evidence of an acute infarct, intracranial hemorrhage, mass, midline shift, or extra-axial  fluid collection. The ventricles and sulci are within normal limits for age. Vascular: Calcified atherosclerosis at the skull base. No hyperdense vessel. Skull: No acute fracture or suspicious osseous lesion. Sinuses/Orbits: Mild right ethmoid air cell mucosal thickening. Clear mastoid air cells. Unremarkable orbits. Other: None. CT CERVICAL SPINE FINDINGS Alignment: Normal. Skull base and vertebrae: No acute fracture or suspicious osseous lesion. Soft tissues and spinal canal: No prevertebral fluid or swelling. No visible canal hematoma. Disc levels: Moderate cervical spondylosis and mild-to-moderate facet arthrosis. Multilevel neural foraminal stenosis due to uncovertebral spurring, moderate to severe on the right greater than left at C5-6, left greater than right at C6-7, and on the right at C7-T1. Upper chest: Clear lung apices. Other: Post treatment changes in the neck with mucosal edema. Decreased size of the left level II nodal mass and decreased size or possible resolution of the left-sided oropharyngeal mass, not primarily evaluated on this study. IMPRESSION: No evidence of acute intracranial abnormality or cervical spine fracture. Electronically Signed   By: Logan Bores M.D.   On: 05/03/2022 10:38   DG Chest 2 View  Result Date: 05/03/2022 CLINICAL DATA:  Cough, weakness EXAM: CHEST - 2 VIEW COMPARISON:  11/08/2018 FINDINGS: Right IJ approach chest port with distal tip terminating the level of the distal SVC. Left-sided implanted cardiac device remains in place. Cardiomegaly. No focal airspace consolidation, pleural effusion, or pneumothorax. IMPRESSION: No active cardiopulmonary disease. Electronically Signed   By: Davina Poke D.O.   On: 05/03/2022 10:25    Pending Labs Unresulted Labs (From admission, onward)     Start     Ordered   05/04/22 0500  CBC with Differential/Platelet  Tomorrow morning,   R        05/03/22 1316   05/04/22 7673  Basic metabolic panel  Tomorrow morning,   R         05/03/22 1317   05/03/22 1500  Osmolality  Once,   R        05/03/22 1500   05/03/22 1317  Sodium  Now then every 2 hours,   R      05/03/22 1316   05/03/22 1316  Sodium, urine, random  Once,   R        05/03/22 1315   05/03/22 1316  Osmolality, urine  Once,   R        05/03/22 1315   05/03/22 1237  Urine Culture  (Urine Culture)  Once,   URGENT       Question:  Indication  Answer:  Dysuria   05/03/22 1236   05/03/22 0945  Blood culture (routine x 2)  BLOOD CULTURE X 2,   R      05/03/22 0947   05/03/22 0945  Lactic acid, plasma  Now then every 2 hours,   R      05/03/22 4193  Vitals/Pain Today's Vitals   05/03/22 1222 05/03/22 1611 05/03/22 1615 05/03/22 1635  BP: (!) 109/54 111/73 120/63   Pulse: 99 92 90   Resp: (!) '21 19 15   '$ Temp:  98.6 F (37 C)  98.3 F (36.8 C)  TempSrc:  Oral    SpO2: 97% 96% 93%   Weight:      Height:      PainSc:        Isolation Precautions Airborne and Contact precautions  Medications Medications  0.9 %  sodium chloride infusion (has no administration in time range)  tamsulosin (FLOMAX) capsule 0.4 mg (has no administration in time range)  aspirin EC tablet 81 mg (has no administration in time range)  multivitamin with minerals tablet 1 tablet (has no administration in time range)  nitroGLYCERIN (NITROSTAT) SL tablet 0.4 mg (has no administration in time range)  rosuvastatin (CRESTOR) tablet 10 mg (has no administration in time range)  temazepam (RESTORIL) capsule 15 mg (has no administration in time range)  isosorbide mononitrate (IMDUR) 24 hr tablet 15 mg (has no administration in time range)  sodium chloride 0.9 % bolus 500 mL (0 mLs Intravenous Stopped 05/03/22 1249)  cefTRIAXone (ROCEPHIN) 1 g in sodium chloride 0.9 % 100 mL IVPB (0 g Intravenous Stopped 05/03/22 1456)    Mobility walks with person assist High fall risk   Focused Assessments     R Recommendations: See Admitting Provider Note  Report  given to:   Additional Notes:

## 2022-05-03 NOTE — Telephone Encounter (Signed)
Pt wife called with concerns of recent prescription for scopolamine patch, causing pt to fall multiple times this week. Wife stated EMS was called twice yesterday and was advised not to take pt to the ED because of the wait time (12 hours). Advised to call cancer center next day to get recommendations. Called Dr.Squires nurse Juliette Mangle to f/u about complaints from wife since scopolamine prescription was given. Jennifer,RN will f/u.

## 2022-05-03 NOTE — H&P (Addendum)
History and Physical    Clarence Andrade CHE:[id number] DOB: 1945-09-09 DOA: 05/03/2022  PCP: Kathalene Frames, MD Patient coming from: home  I have personally briefly reviewed patient's old medical records in [place]  Chief Complaint: weakness/falls   HPI: [name] is a 76 y.o. male with h/o HTN, HLD, BPH,  squamous cell carcinoma of the oropharynx, finished concurrent chemo/XRT on 12/28,  followed by Dr. Chryl Heck , presents with c/o progressive weakness, having hallucination and falls since prescribed scopolamine patch recently , also reports dysuria, reports was treated for uti a month ago, flomax along with other home bp meds were recently discontinued due to concerns for hypotension. Reports cough is chronic, no fever. Reports bilateral lower extremity edema x1 week.   ED Course: Blood pressure 120/63, pulse 90, temperature 98.3 F (36.8 C), resp. rate 15, height 6' (1.829 m), weight 121.6 kg, SpO2 93 %.   Data reviewed: Respiratory viral panel negative  EKG: Independently reviewed. Sinus rhythm, 1st degree av block, no acute st/t changes  Cxr/c spine CT /Ct head  no acute findings Ua concerning for UTI, urine culture in process, blood culture in process Bnp 181.7, hight sensitivity troponin 21, no chest pain  Lactic acid 1.1 Cbc stable at baseline with chronic anemia, hgb 10 Sodium 114, cr 0.69 He is given 500cc ns saline bolusx1 and rocephin x1, admission requested.    Review of Systems: As per HPI otherwise all other systems reviewed and are negative.   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 4.4cm by imaging 03/2020.   CAD (coronary artery disease)    a. Cath 03/2018 - CTO of the prox-mid LAD with bridging, L-L and R-L collaterals, moderate, non-obstructive disease involving the proximal LAD, proximal LCx, and distal RCA, more  severe disease noted involving small branch vessels (RV marginal and superior branch of OM3).   Complete heart block (HCC)    Gallstones    Heart murmur    History of pacemaker    a. 11/2018 - Medtronic, intermittent CHB.   Hyperlipidemia    Morbid obesity (HCC)    Myocardial infarction [facility])    NSVT (nonsustained ventricular tachycardia) (HCC)    OSA (obstructive sleep apnea)    Severe PSG 1/07 AHI 66/hr, O2 Nadir 50% Refuses treatment - Pt does not believe test was accurate   Pericarditis    Pneumonia    Pulmonary HTN (HCC)    mild with PASP 23mHg by echo 02/2018, not seen on 03/2020 echo   Pulmonary nodule    Screening for AAA (abdominal aortic aneurysm) 2010   CT Abd/Pelvis   Ventral hernia    03/2009    Past Surgical History:  Procedure Laterality Date   CHOLECYSTECTOMY N/A 10/11/2019   Procedure: LAPAROSCOPIC CHOLECYSTECTOMY WITH INTRAOPERATIVE CHOLANGIOGRAM;  Surgeon: GArmandina Gemma MD;  Location: [facility];  Service: General;  Laterality: N/A;   CORONARY ANGIOGRAPHY N/A 03/23/2018   Procedure: CORONARY ANGIOGRAPHY;  Surgeon: ENelva Bush MD;  [place];  Service: Cardiovascular;  Laterality: N/A;   HERNIA REPAIR  05/2002   three   IR GASTROSTOMY TUBE MOD SED  03/08/2022   IR GJ TUBE CHANGE  04/15/2022   IR IMAGING GUIDED PORT INSERTION  03/08/2022   KNEE ARTHROSCOPY Left 09/2002   PACEMAKER IMPLANT N/A 11/08/2018   Procedure:  PACEMAKER IMPLANT;  Surgeon: Constance Haw, MD;  Location: [facility];  Service: Cardiovascular;  Laterality: N/A;   SHOULDER SURGERY Right 2020   TOTAL KNEE ARTHROPLASTY Bilateral 09/2010   VENTRAL HERNIA REPAIR  03/2009    Social History  reports that he quit smoking about 31 years ago. His smoking use included cigarettes. He smoked an average of .5 packs per day. He has never used smokeless tobacco. He reports that he does not drink alcohol and does not use drugs.  Allergies  Allergen Reactions    Sertraline Hcl Other (See Comments)    Tremor, HA, Decreased Libido   Transderm-Scop [Scopolamine] Other (See Comments)    Tremors, hallucinations, weakness    Family History  Problem Relation Age of Onset   Heart attack Father    Heart disease Father    Aneurysm Father    Breast cancer Sister    Colon cancer Sister     Prior to Admission medications   Medication Sig Start Date End Date Taking? Authorizing Provider  acetaminophen (TYLENOL) 500 MG tablet Take 1,000 mg by mouth every 6 (six) hours as needed (pain).    [provider]  aspirin EC 81 MG tablet Take 81 mg by mouth daily.    [provider]  buPROPion (WELLBUTRIN XL) 150 MG 24 hr tablet Take 150 mg by mouth every morning. 01/18/21   [provider]  COVID-19 mRNA bivalent vaccine, Pfizer, (PFIZER COVID-19 VAC BIVALENT) injection Inject into the muscle. 04/09/21   Carlyle Basques, MD  COVID-19 mRNA Vac-TriS, Pfizer, (PFIZER-BIONT COVID-19 VAC-TRIS) SUSP injection Inject into the muscle. 10/27/20   Carlyle Basques, MD  COVID-19 mRNA vaccine [id number] (COMIRNATY) syringe Inject into the muscle. 02/22/22   Carlyle Basques, MD  influenza vaccine adjuvanted (FLUAD) 0.5 ML injection Inject into the muscle. 01/28/22   Carlyle Basques, MD  isosorbide mononitrate (IMDUR) 30 MG 24 hr tablet TAKE 1 TABLET ONCE DAILY. 03/01/21   Dunn, Nedra Hai, PA-C  metoprolol succinate (TOPROL-XL) 25 MG 24 hr tablet Take 0.5 tablets (12.5 mg total) by mouth daily. 02/17/22   Sueanne Margarita, MD  Multiple Vitamin (MULTIVITAMIN) tablet Take 1 tablet by mouth daily.    [provider]  nitroGLYCERIN (NITROSTAT) 0.4 MG SL tablet Place 1 tablet (0.4 mg total) under the tongue every 5 (five) minutes as needed for chest pain. Patient not taking: Reported on 03/29/2022 03/19/18   Nahser, Wonda Cheng, MD  Nutritional Supplements (FEEDING SUPPLEMENT, OSMOLITE 1.5 CAL,) LIQD 7 cartons Osmolite 1.5/equivalent split over four feedings.  Flush tube with 90 ml water before and after each feeding. Drink by mouth or give via tube additional 474 ml (2 c.) water. Provides 1659 ml/day 2485 kcal, 104 g protein, 1267 ml free water (2461 ml total water) Meets 100% RDI 04/06/22   Benay Pike, MD  Protein (FEEDING SUPPLEMENT, PROSOURCE TF20,) liquid Place 60 mLs into feeding tube daily. 04/06/22   Benay Pike, MD  rosuvastatin (CRESTOR) 10 MG tablet TAKE 1 TABLET ONCE DAILY. 02/10/21   Dunn, Nedra Hai, PA-C  RSV vaccine recomb adjuvanted (AREXVY) 120 MCG/0.5ML injection Inject into the muscle. 01/21/22   Carlyle Basques, MD  scopolamine (TRANSDERM-SCOP) 1 MG/3DAYS Place 1 patch (1.5 mg total) onto the skin every 3 (three) days. 04/25/22   [name], MD  tamsulosin (FLOMAX) 0.4 MG CAPS capsule Take 0.4 mg by mouth daily. 03/03/22   [provider]  temazepam (RESTORIL) 15 MG capsule Take 1 capsule (15 mg total) by  mouth at bedtime as needed for sleep. 04/18/22   [name], MD    Physical Exam: Vitals:   05/03/22 1222 05/03/22 1611 05/03/22 1615 05/03/22 1635  BP: (!) 109/54 111/73 120/63   Pulse: 99 92 90   Resp: (!) '21 19 15   '$ Temp:  98.6 F (37 C)  98.3 F (36.8 C)  TempSrc:  Oral    SpO2: 97% 96% 93%   Weight:      Height:        Constitutional: frail, oriented but with slight confusion  Eyes: PERRL, lids and conjunctivae normal ENMT: Mucous membranes are moist.  Respiratory: clear to auscultation bilaterally, no wheezing, no crackles. Normal respiratory effort. No accessory muscle use.  Cardiovascular: Regular rate and rhythm,  No extremity edema. 2+ pedal pulses. No carotid bruits.  Abdomen: no tenderness, not distended, Bowel sounds positive.  + peg tube Musculoskeletal: bilateral lower extremity pitting edema  Skin: no rashes, lesions, ulcers. No induration Neurologic: CN 2-12 grossly intact. Sensation intact, Strength 5/5 in all 4.  Psychiatric: calm and cooperative    Labs on Admission: I have  personally reviewed following labs and imaging studies  CBC: Recent Labs  Lab 05/03/22 1040  WBC 5.2  NEUTROABS 3.4  HGB 10.1*  HCT 27.3*  MCV 88.6  PLT 376    Basic Metabolic Panel: Recent Labs  Lab 05/03/22 1040 05/03/22 1452  NA 114* 115*  K 4.9  --   CL 76*  --   CO2 28  --   GLUCOSE 108*  --   BUN 23  --   CREATININE 0.69  --   CALCIUM 8.7*  --   MG  --  1.2*  PHOS  --  3.2    GFR: Estimated Creatinine Clearance: 105.8 mL/min (by C-G formula based on SCr of 0.69 mg/dL).  Liver Function Tests: Recent Labs  Lab 05/03/22 1040  AST 62*  ALT 39  ALKPHOS 57  BILITOT 0.8  PROT 6.3*  ALBUMIN 3.1*    Urine analysis:    Component Value Date/Time   COLORURINE YELLOW 05/03/2022 0945   APPEARANCEUR CLEAR 05/03/2022 0945   LABSPEC 1.020 05/03/2022 0945   PHURINE 6.0 05/03/2022 0945   GLUCOSEU NEGATIVE 05/03/2022 0945   HGBUR SMALL (A) 05/03/2022 0945   BILIRUBINUR NEGATIVE 05/03/2022 0945   KETONESUR 5 (A) 05/03/2022 0945   PROTEINUR NEGATIVE 05/03/2022 0945   UROBILINOGEN 1.0 09/07/2010 1440   NITRITE NEGATIVE 05/03/2022 0945   LEUKOCYTESUR MODERATE (A) 05/03/2022 0945    Radiological Exams on Admission: VAS [place] LOWER EXTREMITY VENOUS (DVT)  Result Date: 05/03/2022  Lower Venous DVT Study Patient Name:  JACOBE STUDY  Date of Exam:   05/03/2022 Medical Rec #: [id number]        Accession #:    [id number] Date of Birth: 03/15/1946        Patient Gender: M Patient Age:   39 years Exam Location[facility]  [facility] Procedure:      VAS [place] LOWER EXTREMITY VENOUS (DVT) Referring Phys: Annamaria Boots Jerilee Space --------------------------------------------------------------------------------  Indications: Edema.  Risk Factors: Cancer. Limitations: Poor ultrasound/tissue interface. Comparison Study: No prior studies. Performing Technologist: Oliver Hum RVT  Examination Guidelines: A complete evaluation includes B-mode imaging, spectral Doppler, color Doppler, and power Doppler as  needed of all accessible portions of each vessel. Bilateral testing is considered an integral part of a complete examination. Limited examinations for reoccurring indications may be performed as noted. The reflux portion of the exam is  performed with the patient in reverse Trendelenburg.  +---------+---------------+---------+-----------+----------+--------------+ RIGHT    CompressibilityPhasicitySpontaneityPropertiesThrombus Aging +---------+---------------+---------+-----------+----------+--------------+ CFV      Full           Yes      Yes                                 +---------+---------------+---------+-----------+----------+--------------+ SFJ      Full                                                        +---------+---------------+---------+-----------+----------+--------------+ FV Prox  Full                                                        +---------+---------------+---------+-----------+----------+--------------+ FV Mid   Full                                                        +---------+---------------+---------+-----------+----------+--------------+ FV DistalFull                                                        +---------+---------------+---------+-----------+----------+--------------+ PFV      Full                                                        +---------+---------------+---------+-----------+----------+--------------+ POP      Full           Yes      Yes                                 +---------+---------------+---------+-----------+----------+--------------+ PTV      Full                                                        +---------+---------------+---------+-----------+----------+--------------+ PERO     Full                                                        +---------+---------------+---------+-----------+----------+--------------+    +---------+---------------+---------+-----------+----------+-------------------+ LEFT     CompressibilityPhasicitySpontaneityPropertiesThrombus Aging      +---------+---------------+---------+-----------+----------+-------------------+ CFV      Full           Yes  Yes                                      +---------+---------------+---------+-----------+----------+-------------------+ SFJ      Full                                                             +---------+---------------+---------+-----------+----------+-------------------+ FV Prox  Full                                                             +---------+---------------+---------+-----------+----------+-------------------+ FV Mid   Full                                                             +---------+---------------+---------+-----------+----------+-------------------+ FV DistalFull                                                             +---------+---------------+---------+-----------+----------+-------------------+ PFV      Full                                                             +---------+---------------+---------+-----------+----------+-------------------+ POP      Full           Yes      Yes                                      +---------+---------------+---------+-----------+----------+-------------------+ PTV      Full                                                             +---------+---------------+---------+-----------+----------+-------------------+ PERO                                                  Not well visualized +---------+---------------+---------+-----------+----------+-------------------+     Summary: RIGHT: - There is no evidence of deep vein thrombosis in the lower extremity. However, portions of this examination were limited- see technologist comments above.  - No cystic structure found in the popliteal fossa.  LEFT: - There is  no  evidence of deep vein thrombosis in the lower extremity. However, portions of this examination were limited- see technologist comments above.  - No cystic structure found in the popliteal fossa.  *See table(s) above for measurements and observations. Electronically signed by Jamelle Haring on 05/03/2022 at 4:33:57 PM.    Final    CT Head Wo Contrast  Result Date: 05/03/2022 CLINICAL DATA:  Head trauma, moderate-severe; Neck trauma (Age >= 65y). Multiple recent falls. Currently undergoing treatment for oropharyngeal cancer. EXAM: CT HEAD WITHOUT CONTRAST CT CERVICAL SPINE WITHOUT CONTRAST TECHNIQUE: Multidetector CT imaging of the head and cervical spine was performed following the standard protocol without intravenous contrast. Multiplanar CT image reconstructions of the cervical spine were also generated. RADIATION DOSE REDUCTION: This exam was performed according to the departmental dose-optimization program which includes automated exposure control, adjustment of the mA and/or kV according to patient size and/or use of iterative reconstruction technique. COMPARISON:  Neck CT 02/02/2022 FINDINGS: CT HEAD FINDINGS Brain: There is no evidence of an acute infarct, intracranial hemorrhage, mass, midline shift, or extra-axial fluid collection. The ventricles and sulci are within normal limits for age. Vascular: Calcified atherosclerosis at the skull base. No hyperdense vessel. Skull: No acute fracture or suspicious osseous lesion. Sinuses/Orbits: Mild right ethmoid air cell mucosal thickening. Clear mastoid air cells. Unremarkable orbits. Other: None. CT CERVICAL SPINE FINDINGS Alignment: Normal. Skull base and vertebrae: No acute fracture or suspicious osseous lesion. Soft tissues and spinal canal: No prevertebral fluid or swelling. No visible canal hematoma. Disc levels: Moderate cervical spondylosis and mild-to-moderate facet arthrosis. Multilevel neural foraminal stenosis due to uncovertebral spurring,  moderate to severe on the right greater than left at C5-6, left greater than right at C6-7, and on the right at C7-T1. Upper chest: Clear lung apices. Other: Post treatment changes in the neck with mucosal edema. Decreased size of the left level II nodal mass and decreased size or possible resolution of the left-sided oropharyngeal mass, not primarily evaluated on this study. IMPRESSION: No evidence of acute intracranial abnormality or cervical spine fracture. Electronically Signed   By: Logan Bores M.D.   On: 05/03/2022 10:38   CT Cervical Spine Wo Contrast  Result Date: 05/03/2022 CLINICAL DATA:  Head trauma, moderate-severe; Neck trauma (Age >= 65y). Multiple recent falls. Currently undergoing treatment for oropharyngeal cancer. EXAM: CT HEAD WITHOUT CONTRAST CT CERVICAL SPINE WITHOUT CONTRAST TECHNIQUE: Multidetector CT imaging of the head and cervical spine was performed following the standard protocol without intravenous contrast. Multiplanar CT image reconstructions of the cervical spine were also generated. RADIATION DOSE REDUCTION: This exam was performed according to the departmental dose-optimization program which includes automated exposure control, adjustment of the mA and/or kV according to patient size and/or use of iterative reconstruction technique. COMPARISON:  Neck CT 02/02/2022 FINDINGS: CT HEAD FINDINGS Brain: There is no evidence of an acute infarct, intracranial hemorrhage, mass, midline shift, or extra-axial fluid collection. The ventricles and sulci are within normal limits for age. Vascular: Calcified atherosclerosis at the skull base. No hyperdense vessel. Skull: No acute fracture or suspicious osseous lesion. Sinuses/Orbits: Mild right ethmoid air cell mucosal thickening. Clear mastoid air cells. Unremarkable orbits. Other: None. CT CERVICAL SPINE FINDINGS Alignment: Normal. Skull base and vertebrae: No acute fracture or suspicious osseous lesion. Soft tissues and spinal canal: No  prevertebral fluid or swelling. No visible canal hematoma. Disc levels: Moderate cervical spondylosis and mild-to-moderate facet arthrosis. Multilevel neural foraminal stenosis due to uncovertebral spurring, moderate to severe on the right greater than left  at C5-6, left greater than right at C6-7, and on the right at C7-T1. Upper chest: Clear lung apices. Other: Post treatment changes in the neck with mucosal edema. Decreased size of the left level II nodal mass and decreased size or possible resolution of the left-sided oropharyngeal mass, not primarily evaluated on this study. IMPRESSION: No evidence of acute intracranial abnormality or cervical spine fracture. Electronically Signed   By: Logan Bores M.D.   On: 05/03/2022 10:38   DG Chest 2 View  Result Date: 05/03/2022 CLINICAL DATA:  Cough, weakness EXAM: CHEST - 2 VIEW COMPARISON:  11/08/2018 FINDINGS: Right IJ approach chest port with distal tip terminating the level of the distal SVC. Left-sided implanted cardiac device remains in place. Cardiomegaly. No focal airspace consolidation, pleural effusion, or pneumothorax. IMPRESSION: No active cardiopulmonary disease. Electronically Signed   By: Davina Poke D.O.   On: 05/03/2022 10:25      Assessment/Plan Principal Problem:   Hypokalemia   Hyponatremia -Volume status hard to assess as has bilateral lower extremity edema  but clear lungs and moist tongue  -reports take sips for meds by mouth , most nutrition through peg tube (8oz ensure complete with 8oz whole milk,x3, 20oz waterx3 since early December) -does has bilateral lower extremity pitting edema -check urine sodium/urine osmo/serum osmo/uric acid -nephrology consulted ,will follow recommendation   hypomagnesemia Replace mag  Bilateral lower extremity edemax1 week Check venous doppler, echocardiogram, elevated legs  HTN Started to have low blood pressure 3-4 wks , was taken off lopressor /lisinopril/flomax Resume imdur at  lower dose with holding parameters ( significant CAD history)  CAD , denies chest pain, continue low dose imdur ( with holding parameters), continue statin and asa CHD s/p ppm  UTI was treated for uti a month ago Urine culture/blood culture in process, continue rocephin  BPH was on flomax for 2-3 weeks, taken off due to concerns for hypotension, will resume flomax for now ( as it does not affect blood pressure significantly)   squamous cell carcinoma of the oropharynx, finished concurrent chemo/XRT on 12/28 ( missed one dose of chemo, received 33/35 XRT),  followed by Dr. Chryl Heck  Continue nutrition per tube Nutrition consulted   FTT/weakness /falls/weight loss( Loss 40lbs in 6 weeks) Reports was Driving a week ago Fall precaution, PT eval  Body mass index is 36.35 kg/m. Meet obesity criteria     DVT prophylaxis: lovenox    Code Status:   full Family Communication: wife at bedside    Patient is from: home   Anticipated DC to:  TBD  Anticipated DC date:  >42hrs   Consults called:  Nephrology  Admission status:  INP to stepdown  Severity of Illness:   The appropriate patient status for this patient is INPATIENT due to history and comorbidities, severity of illness, required intensity of service to ensure the patient's safety and to avoid risk of adverse events/further clinical deterioration.  Severity of illness/comorbidities:severe hyponatremia, FTT Intensity of service: tests, high frequency of surveillance, interventions It is not anticipated that the patient will be medically stable for discharge from the hospital within 2 midnights of admission.    Voice Recognition Viviann Spare dictation system was used to create this note, attempts have been made to correct errors. Please contact the author with questions and/or clarifications.  Florencia Reasons MD PhD FACP [facility]  How to contact the [facility] Attending or Consulting provider [place] or covering provider during after  hours [place], for this patient?  Check the care team in [facility] and look for a) attending/consulting [facility] provider listed and b) the [facility] team listed Log into [email or web address] and use [facility]'s universal password to access. If you do not have the password, please contact the hospital operator. Locate the [facility] provider you are looking for under [facility] and page to a number that you can be directly reached. If you still have difficulty reaching the provider, please page the [facility] (Director on Call) for the Hospitalists listed on amion for assistance.  05/03/2022, 6:23 PM

## 2022-05-03 NOTE — Progress Notes (Signed)
Ms. Flemmer presents for follow up for completion of radiation treatment for Squamous cell carcinoma of oropharynx . He completed treatment on 04-25-22.   Pain issues, if any: none Using a feeding tube? Just flushing now, doing ensures by mouth, trying more solid food,  Weight changes, if any:  Wt Readings from Last 3 Encounters:  05/13/22 262 lb (118.8 kg)  05/11/22 266 lb 14.4 oz (121.1 kg)  05/06/22 261 lb 0.4 oz (118.4 kg)    Swallowing issues, if any: no difficulty swallowing, nothing taste good to pt Smoking or chewing tobacco? none Using fluoride trays daily? none Last ENT visit was on: none since treatment Other notable issues, if any: energy level is extremely low and bp is low in the morning. Doing ensures by mouth three times as a day, intermittent tremors,   Vitals:   05/13/22 1508  BP: 104/60  Pulse: 84  Resp: 20  Temp: 98.3 F (36.8 C)  SpO2: 98%

## 2022-05-03 NOTE — ED Triage Notes (Signed)
EMS states that he started a new medication a week ago and its caused him to be weaker and has fallen 4X in the last 4 days. He had chjemo and radiation 2 weeks ago.  BP 142/88 HR 90 O2 93 RA

## 2022-05-03 NOTE — Telephone Encounter (Signed)
RN Clarence Andrade called pt wife after Clarence Coria RN called to inform RN Clarence Andrade that pt had been having issues with falling for about a week now. His wife stated he had been having hallucinations and falling since Scopolamine patch had been started by Dr. Isidore Moos. She stated EMS had been called this week (Ems had advised pt not to go to the ED due to a 12 hour wait). Pt wife stated that she took off the patch yesterday and he is still having the same symptoms he was having. RN advised wife to call EMS and get him to the hospital. Clarence Andrade called ED charge nurse to inform them that he would be coming and if possible requested he be placed in a room to seen and not have to wait in the waiting room. RN Clarence Andrade stated she would try her best to accommodate this request. His wife stated she would call EMS.

## 2022-05-03 NOTE — ED Notes (Signed)
Patient refused bair hugger

## 2022-05-03 NOTE — ED Notes (Signed)
Called lab and added a urine culture

## 2022-05-04 ENCOUNTER — Inpatient Hospital Stay (HOSPITAL_COMMUNITY): Payer: PPO

## 2022-05-04 DIAGNOSIS — I509 Heart failure, unspecified: Secondary | ICD-10-CM | POA: Diagnosis not present

## 2022-05-04 DIAGNOSIS — E876 Hypokalemia: Secondary | ICD-10-CM | POA: Diagnosis not present

## 2022-05-04 DIAGNOSIS — R627 Adult failure to thrive: Secondary | ICD-10-CM | POA: Diagnosis not present

## 2022-05-04 DIAGNOSIS — E871 Hypo-osmolality and hyponatremia: Secondary | ICD-10-CM | POA: Diagnosis not present

## 2022-05-04 LAB — BASIC METABOLIC PANEL
Anion gap: 9 (ref 5–15)
BUN: 21 mg/dL (ref 8–23)
CO2: 27 mmol/L (ref 22–32)
Calcium: 8.4 mg/dL — ABNORMAL LOW (ref 8.9–10.3)
Chloride: 81 mmol/L — ABNORMAL LOW (ref 98–111)
Creatinine, Ser: 0.71 mg/dL (ref 0.61–1.24)
GFR, Estimated: >60 mL/min (ref >60)
Glucose, Bld: 110 mg/dL — ABNORMAL HIGH (ref 70–99)
Potassium: 4.3 mmol/L (ref 3.5–5.1)
Sodium: 117 mmol/L — CL (ref 135–145)

## 2022-05-04 LAB — SODIUM
Sodium: 117 mmol/L — CL (ref 135–145)
Sodium: 117 mmol/L — CL (ref 135–145)
Sodium: 117 mmol/L — CL (ref 135–145)
Sodium: 117 mmol/L — CL (ref 135–145)
Sodium: 119 mmol/L — CL (ref 135–145)

## 2022-05-04 LAB — ECHOCARDIOGRAM COMPLETE
AR max vel: 1.44 cm2
AV Area VTI: 1.74 cm2
AV Area mean vel: 1.79 cm2
AV Mean grad: 15.7 mmHg
AV Peak grad: 25.6 mmHg
Ao pk vel: 2.53 m/s
Area-P 1/2: 6.17 cm2
Height: 72 in
MV M vel: 4.36 m/s
MV Peak grad: 76.2 mmHg
S' Lateral: 2.6 cm
Single Plane A4C EF: 51.8 %
Weight: 4288 oz

## 2022-05-04 LAB — CBC WITH DIFFERENTIAL/PLATELET
Abs Immature Granulocytes: 0.08 10*3/uL — ABNORMAL HIGH (ref 0.00–0.07)
Basophils Absolute: 0 10*3/uL (ref 0.0–0.1)
Basophils Relative: 0 %
Eosinophils Absolute: 0 10*3/uL (ref 0.0–0.5)
Eosinophils Relative: 1 %
HCT: 24.5 % — ABNORMAL LOW (ref 39.0–52.0)
Hemoglobin: 9 g/dL — ABNORMAL LOW (ref 13.0–17.0)
Immature Granulocytes: 2 %
Lymphocytes Relative: 9 %
Lymphs Abs: 0.4 10*3/uL — ABNORMAL LOW (ref 0.7–4.0)
MCH: 33 pg (ref 26.0–34.0)
MCHC: 36.7 g/dL — ABNORMAL HIGH (ref 30.0–36.0)
MCV: 89.7 fL (ref 80.0–100.0)
Monocytes Absolute: 1 10*3/uL (ref 0.1–1.0)
Monocytes Relative: 24 %
Neutro Abs: 2.7 10*3/uL (ref 1.7–7.7)
Neutrophils Relative %: 64 %
Platelets: 175 10*3/uL (ref 150–400)
RBC: 2.73 MIL/uL — ABNORMAL LOW (ref 4.22–5.81)
RDW: 17.5 % — ABNORMAL HIGH (ref 11.5–15.5)
WBC: 4.2 10*3/uL (ref 4.0–10.5)
nRBC: 0 % (ref 0.0–0.2)

## 2022-05-04 LAB — CBC
HCT: 25.3 % — ABNORMAL LOW (ref 39.0–52.0)
Hemoglobin: 9.3 g/dL — ABNORMAL LOW (ref 13.0–17.0)
MCH: 32.7 pg (ref 26.0–34.0)
MCHC: 36.8 g/dL — ABNORMAL HIGH (ref 30.0–36.0)
MCV: 89.1 fL (ref 80.0–100.0)
Platelets: 167 10*3/uL (ref 150–400)
RBC: 2.84 MIL/uL — ABNORMAL LOW (ref 4.22–5.81)
RDW: 17.5 % — ABNORMAL HIGH (ref 11.5–15.5)
WBC: 5.8 10*3/uL (ref 4.0–10.5)
nRBC: 0 % (ref 0.0–0.2)

## 2022-05-04 LAB — MAGNESIUM: Magnesium: 1.4 mg/dL — ABNORMAL LOW (ref 1.7–2.4)

## 2022-05-04 LAB — CORTISOL-AM, BLOOD: Cortisol - AM: 13.6 ug/dL (ref 6.7–22.6)

## 2022-05-04 LAB — CREATININE, SERUM
Creatinine, Ser: 0.7 mg/dL (ref 0.61–1.24)
GFR, Estimated: >60 mL/min (ref >60)

## 2022-05-04 LAB — OSMOLALITY, URINE: Osmolality, Ur: 716 mOsm/kg (ref 300–900)

## 2022-05-04 LAB — TSH: TSH: 0.315 u[IU]/mL — ABNORMAL LOW (ref 0.350–4.500)

## 2022-05-04 MED ORDER — CHLORHEXIDINE GLUCONATE CLOTH 2 % EX PADS
6.0000 | MEDICATED_PAD | Freq: Every day | CUTANEOUS | Status: DC
  Administered 2022-05-04 – 2022-05-06 (×3): 6 via TOPICAL

## 2022-05-04 MED ORDER — ORAL CARE MOUTH RINSE
15.0000 mL | OROMUCOSAL | Status: DC
  Administered 2022-05-05 – 2022-05-06 (×7): 15 mL via OROMUCOSAL

## 2022-05-04 MED ORDER — ORAL CARE MOUTH RINSE: 15.0000 mL | OROMUCOSAL | Status: DC | PRN

## 2022-05-04 MED ORDER — PERFLUTREN LIPID MICROSPHERE
1.0000 mL | INTRAVENOUS | Status: AC | PRN
  Administered 2022-05-04: 2 mL via INTRAVENOUS

## 2022-05-04 MED ORDER — MELATONIN 3 MG PO TABS
3.0000 mg | ORAL_TABLET | Freq: Every day | ORAL | Status: DC
  Administered 2022-05-05: 3 mg via ORAL
  Filled 2022-05-04: qty 1

## 2022-05-04 MED ORDER — QUETIAPINE FUMARATE 50 MG PO TABS: 25.0000 mg | ORAL_TABLET | Freq: Every evening | ORAL | Status: DC | PRN

## 2022-05-04 MED ORDER — ENOXAPARIN SODIUM 40 MG/0.4ML IJ SOSY
40.0000 mg | PREFILLED_SYRINGE | INTRAMUSCULAR | Status: DC
  Administered 2022-05-05 – 2022-05-07 (×3): 40 mg via SUBCUTANEOUS
  Filled 2022-05-04 (×4): qty 0.4

## 2022-05-04 MED ORDER — SODIUM CHLORIDE 3 % IV SOLN
INTRAVENOUS | Status: DC
  Filled 2022-05-04 (×4): qty 500

## 2022-05-04 MED ORDER — FREE WATER
200.0000 mL | Freq: Four times a day (QID) | Status: DC
  Administered 2022-05-04 – 2022-05-05 (×3): 200 mL

## 2022-05-04 NOTE — Progress Notes (Signed)
[facility] Progress Note  Subjective: seen in room.  Na+ improved to 117 but then has not budged today. Denies any SOB or CP. Up in the chair.   Vitals:   05/04/22 0545 05/04/22 0745 05/04/22 0845 05/04/22 1133  BP: 98/69  (!) 114/53 117/64  Pulse: 83  (!) 105 89  Resp: '18  18 17  '$ Temp:  98.1 F (36.7 C)  98.3 F (36.8 C)  TempSrc:  Oral  Oral  SpO2: 99%  98% 98%  Weight:      Height:        Exam: Gen alert, no distress, elderly pleasant male Chest clear bilat to bases RRR no MRG Abd soft ntnd no mass or ascites +bs Ext 1+ bilat pretib edema Neuro is alert, Ox 3 , nf    Home meds include - asa 81, MVI, sl ntg, crestor, restoril, toprol xl 12.5 qd, prosource qd, flomax, prns/ vits/ supps      BP's normal 110- 125/ 63- 86    Orthostatics >> 103/92        HR 90   lying                              113/63        HR 102 sitting                              107/58        HR 107 standing 0 min                              109/54        HR 106 standing 3 min      RA 97%, HR 90s- 100s, RR 15-21  afeb     Na 114  K 4.9  CO2 28  BUn 23  Creat 0.69      Ca 8.7  alb 3.1  AST 62/ aLT 39  Tbili 0.8   BNP 181       Trop 21, 18    WBC 5K  Hb 10    UA many bact, >50 wbc, 0-5 rbc/ epis    UNa 14, UCr 125 , UOsm 716     CXR - no active disease       Assessment/ Plan: Hyponatremia - in patient w/ head & neck cancer, recently completed his chemo and radiation and is having lightheadedness and falls at home. Has a PEG tube. Has some LE edema but clear CXR and lungs on exam. UNa and FeNa are low, and the Na+ levels improved from 114 to 117 but since that improvement have not improved today. Suspect there is SIADH component (which is usually cancer related). Will dc NS IVF's and start 3% saline at 45 cc/hr. Cont Na+ levels q 4 hrs. Will follow.  Head & neck cancer -stage I/ II. Pt has PEG tube, finished chemo / XRT recently [facility] - sp PPM Morbid obesity OSA Hx of pHTN             Rob Gautam Langhorst 05/04/2022, 2:11 PM   Recent Labs  Lab 05/03/22 1040 05/03/22 1452 05/04/22 0200 05/04/22 0548  HGB 10.1*  --  9.3* 9.0*  ALBUMIN 3.1*  --   --   --   CALCIUM 8.7*  --   --  8.4*  PHOS  --  3.2  --   --   CREATININE 0.69  --  0.70 0.71  K 4.9  --   --  4.3   No results for input(s): "IRON", "TIBC", "FERRITIN" in the last 168 hours. Inpatient medications:  aspirin EC  81 mg Oral Daily   enoxaparin (LOVENOX) injection  40 mg Subcutaneous Q24H   feeding supplement  237 mL Per Tube TID BM   free water  200 mL Per Tube Q6H   melatonin  3 mg Oral QHS   multivitamin with minerals  1 tablet Oral Daily   rosuvastatin  10 mg Oral Daily   tamsulosin  0.4 mg Oral Daily    sodium chloride 75 mL/hr at 05/04/22 0339   cefTRIAXone (ROCEPHIN)  IV     nitroGLYCERIN, QUEtiapine, temazepam

## 2022-05-04 NOTE — Progress Notes (Incomplete)
  Echocardiogram 2D Echocardiogram has been performed.  Clarence Andrade 05/04/2022, 2:59 PM

## 2022-05-04 NOTE — ED Notes (Signed)
Critical Sodium 117. Notified J. Olena Heckle, NP.

## 2022-05-04 NOTE — Evaluation (Signed)
Physical Therapy Evaluation Patient Details Name: Clarence Andrade MRN: [id number] DOB: [birth year] Today's Date: 05/04/2022  History of Present Illness  Patient is 76 y.o. male presenting with progressive weakness and hallucinations along with falls since she was recently prescribed scopolamine patch.  He has had orthostatic symptoms for the last several days along with a cough. PMH significant for essential hypertension, squamous cell carcinoma of the oropharynx finished chemotherapy and radiation therapy this month by Dr. Chryl Heck, with a PEG tube in place.   Clinical Impression  NYCHOLAS Andrade is 76 y.o. male admitted with above HPI and diagnosis. Patient is currently limited by functional impairments below (see PT problem list). Patient lives with his wife and is independent with no device at baseline. Currently he has been limited by dizziness and weakness with mobility. Pt was able to move supine>sit with min guard and sit<>stand with min assist to steady. Min assist provided for RW management to move bed>chair with pt c/o slight dizziness only and BP stable. Patient will benefit from continued skilled PT interventions to address impairments and progress independence with mobility, recommending return home with assist from spouse and HHPT services. Acute PT will follow and progress as able.        Recommendations for follow up therapy are one component of a multi-disciplinary discharge planning process, led by the attending physician.  Recommendations may be updated based on patient status, additional functional criteria and insurance authorization.  Flowsheet Row Most Recent Value  PT Recommendation   Follow Up Recommendations Home health PT Filed 05/04/2022 6195  Assistance recommended at discharge Intermittent Supervision/Assistance Filed 05/04/2022 0933  Patient can return home with the following A little help with walking and/or transfers, A little help with bathing/dressing/bathroom,  Assistance with cooking/housework, Assist for transportation, Help with stairs or ramp for entrance [facility] 05/04/2022 0933  Functional Status Assessment Patient has had a recent decline in their functional status and demonstrates the ability to make significant improvements in function in a reasonable and predictable amount of time. Filed 05/04/2022 0933  PT equipment None recommended by PT [facility] [place] 05/04/2022 0933   Mobility  Bed Mobility Overal bed mobility: Needs Assistance Bed Mobility: Supine to Sit     Supine to sit: Min guard, Supervision, HOB elevated     General bed mobility comments: pt taking extra time, assist for lines    Transfers Overall transfer level: Needs assistance Equipment used: Rolling walker (2 wheels) Transfers: Sit to/from Stand, Bed to chair/wheelchair/BSC Sit to Stand: Min assist, From elevated surface   Step pivot transfers: Min assist       General transfer comment: min assist for power up and to steady poture/balance on standing. pt reported slight dizziness, BP stable. Min assist to guide RW and steps to move bed>chair.    Ambulation/Gait               General Gait Details: deferred due to dizziness  Stairs            Wheelchair Mobility    Modified Rankin (Stroke Patients Only)       Balance Overall balance assessment: Needs assistance Sitting-balance support: Feet supported Sitting balance-Leahy Scale: Good     Standing balance support: Bilateral upper extremity supported, During functional activity, Reliant on assistive device for balance Standing balance-Leahy Scale: Fair                               Pertinent Vitals/Pain  Home Living Family/patient expects to be discharged to:: Private residence Living Arrangements: Spouse/significant other                      Prior Function                       Hand Dominance        Extremity/Trunk Assessment                 Communication      Cognition Arousal/Alertness: Awake/alert Behavior During Therapy: WFL for tasks assessed/performed Overall Cognitive Status: Within Functional Limits for tasks assessed                                             05/04/22 0933  PT - End of Session  Equipment Utilized During Treatment Gait belt  Activity Tolerance Patient tolerated treatment well  Patient left in chair;with call bell/phone within reach  Nurse Communication Mobility status  PT Assessment  PT Recommendation/Assessment Patient needs continued PT services  PT Visit Diagnosis Unsteadiness on feet (R26.81);Muscle weakness (generalized) (M62.81);Difficulty in walking, not elsewhere classified (R26.2)  PT Problem List Decreased strength;Decreased activity tolerance;Decreased balance;Decreased mobility;Decreased knowledge of use of DME;Decreased safety awareness;Decreased knowledge of precautions;Decreased skin integrity;Obesity;Cardiopulmonary status limiting activity  PT Plan  PT Frequency (ACUTE ONLY) Min 3X/week  PT Treatment/Interventions (ACUTE ONLY) DME instruction;Gait training;Stair training;Functional mobility training;Therapeutic activities;Therapeutic exercise;Balance training;Patient/family education  AM-PAC PT "6 Clicks" Mobility Outcome Measure (Version 2)  Help needed turning from your back to your side while in a flat bed without using bedrails? 3  Help needed moving from lying on your back to sitting on the side of a flat bed without using bedrails? 3  Help needed moving to and from a bed to a chair (including a wheelchair)? 3  Help needed standing up from a chair using your arms (e.g., wheelchair or bedside chair)? 3  Help needed to walk in hospital room? 3  Help needed climbing 3-5 steps with a railing?  2  6 Click Score 17  Consider Recommendation of Discharge To: Home with [facility]  Progressive Mobility  What is the highest level of mobility based on the progressive  mobility assessment? Level 3 (Stands with assist) - Balance while standing  and cannot march in place  Mobility Referral No  Activity Stood at bedside;Transferred from bed to chair  PT Recommendation  Follow Up Recommendations Home health PT  Assistance recommended at discharge Intermittent Supervision/Assistance  Patient can return home with the following A little help with walking and/or transfers;A little help with bathing/dressing/bathroom;Assistance with cooking/housework;Assist for transportation;Help with stairs or ramp for entrance  Individuals Consulted  Consulted and Agree with Results and Recommendations Patient  Acute Rehab PT Goals  Patient Stated Goal get home  PT Goal Formulation With patient  Time For Goal Achievement 05/18/22  Potential to Achieve Goals Good  PT Time Calculation  PT Start Time (ACUTE ONLY) 0917  PT Stop Time (ACUTE ONLY) 0941  PT Time Calculation (min) (ACUTE ONLY) 24 min  PT General Charges  $$ ACUTE PT VISIT 1 Visit  PT Evaluation  $PT Eval Moderate Complexity 1 Mod  PT Treatments  $Therapeutic Activity 8-22 mins      Verner Mould, DPT Acute Rehabilitation Services Office [phone]  05/04/22 1:33 PM

## 2022-05-04 NOTE — Progress Notes (Signed)
[facility] PROGRESS NOTE    Progress Note  Clarence Andrade  XQJ:[id number] DOB: 1945/07/26 DOA: 05/03/2022 PCP: Kathalene Frames, MD     Brief Narrative:   Clarence Andrade is an 76 y.o. male past medical history of essential hypertension, squamous cell carcinoma of the oropharynx finished chemotherapy and radiation therapy this month by Dr. Chryl Heck, with a PEG tube in place comes in with progressive weakness and hallucinations along with falls since she was recently prescribed scopolamine patch.  She has had orthostatic symptoms for the last several days along with a cough.  With hyponatremia nephrology was consulted    Assessment/Plan:   Hypovolemic hyponatremia frequent falls and unsteady gait: Nephrology was consulted who recommended urinary osmolarity, sodium and creatinine. Due to his orthostasis he was started on IV fluids.  Will add free water flushes. Was 114 on admission, this morning is 117, seems to be improving.  I am concerned more about an SIADH.  Calcium is unremarkable. Further management per nephrology. Has remained afebrile urine cultures and blood cultures remain negative to date. CT head showed no acute intracranial findings. CT of the C-spine no acute fracture or dislocation. Chest x-ray showed no acute abnormalities  Hypomagnesemia: Replete orally recheck in the morning.  Bilateral lower extremity edema: Lower extremity Doppler pending 2D echo pending  Essential hypertension: Was recently taken off Lopressor lisinopril and Flomax. Hold Imdur.  CAD: Hold Imdur continue aspirin and statins.  Recently treated for UTI: About a month ago urine cultures and blood cultures were sent he was started empirically on Rocephin.  BPH: Flomax was held about 2 months ago due to hypotension, restarted on this admission.  Squamous cell carcinoma of the oropharynx: Need to follow-up with oncology as an outpatient.  Failure to  thrive/weakness/falls: Consult PT OT might need skilled upon discharge.  Nutrition: Consult nutrition for assessment and tube feedings.   DVT prophylaxis: lovenox Family Communication:e Status is: Inpatient Remains inpatient appropriate because: Hyponatremia    Code Status:     Code Status Orders  (From admission, onward)           Start     Ordered   05/03/22 1804  Full code  Continuous       Question:  By:  Answer:  Consent: discussion documented in EHR   05/03/22 1803           Code Status History     Date Active Date Inactive Code Status Order ID Comments User Context   10/11/2019 1050 10/12/2019 0002 Full Code [id number]  Armandina Gemma, MD Inpatient   11/08/2018 1441 11/08/2018 2157 Full Code [id number]  Constance Haw, MD Inpatient   03/23/2018 0855 03/23/2018 1454 Full Code [id number]  Nelva Bush, MD Inpatient         IV Access:   Peripheral IV   Procedures and diagnostic studies:   VAS [place] LOWER EXTREMITY VENOUS (DVT)  Result Date: 05/03/2022  Lower Venous DVT Study Patient Name:  Clarence Andrade  Date of Exam:   05/03/2022 Medical Rec #: [id number]        Accession #:    [id number] Date of Birth: 1946-04-23        Patient Gender: M Patient Age:   56 years Exam Location[place]  [facility] [place] Procedure:      VAS [place] LOWER EXTREMITY VENOUS (DVT) Referring Phys: Annamaria Boots XU --------------------------------------------------------------------------------  Indications: Edema.  Risk Factors: Cancer. Limitations: Poor ultrasound/tissue interface. Comparison Study: No prior studies. Performing Technologist: Belenda Cruise  Collins RVT  Examination Guidelines: A complete evaluation includes B-mode imaging, spectral Doppler, color Doppler, and power Doppler as needed of all accessible portions of each vessel. Bilateral testing is considered an integral part of a complete examination. Limited examinations for reoccurring indications may be performed as noted. The reflux portion  of the exam is performed with the patient in reverse Trendelenburg.  +---------+---------------+---------+-----------+----------+--------------+ RIGHT    CompressibilityPhasicitySpontaneityPropertiesThrombus Aging +---------+---------------+---------+-----------+----------+--------------+ CFV      Full           Yes      Yes                                 +---------+---------------+---------+-----------+----------+--------------+ SFJ      Full                                                        +---------+---------------+---------+-----------+----------+--------------+ FV Prox  Full                                                        +---------+---------------+---------+-----------+----------+--------------+ FV Mid   Full                                                        +---------+---------------+---------+-----------+----------+--------------+ FV DistalFull                                                        +---------+---------------+---------+-----------+----------+--------------+ PFV      Full                                                        +---------+---------------+---------+-----------+----------+--------------+ POP      Full           Yes      Yes                                 +---------+---------------+---------+-----------+----------+--------------+ PTV      Full                                                        +---------+---------------+---------+-----------+----------+--------------+ PERO     Full                                                        +---------+---------------+---------+-----------+----------+--------------+   +---------+---------------+---------+-----------+----------+-------------------+  LEFT     CompressibilityPhasicitySpontaneityPropertiesThrombus Aging      +---------+---------------+---------+-----------+----------+-------------------+ CFV      Full           Yes       Yes                                      +---------+---------------+---------+-----------+----------+-------------------+ SFJ      Full                                                             +---------+---------------+---------+-----------+----------+-------------------+ FV Prox  Full                                                             +---------+---------------+---------+-----------+----------+-------------------+ FV Mid   Full                                                             +---------+---------------+---------+-----------+----------+-------------------+ FV DistalFull                                                             +---------+---------------+---------+-----------+----------+-------------------+ PFV      Full                                                             +---------+---------------+---------+-----------+----------+-------------------+ POP      Full           Yes      Yes                                      +---------+---------------+---------+-----------+----------+-------------------+ PTV      Full                                                             +---------+---------------+---------+-----------+----------+-------------------+ PERO                                                  Not well visualized +---------+---------------+---------+-----------+----------+-------------------+     Summary: RIGHT: - There is no  evidence of deep vein thrombosis in the lower extremity. However, portions of this examination were limited- see technologist comments above.  - No cystic structure found in the popliteal fossa.  LEFT: - There is no evidence of deep vein thrombosis in the lower extremity. However, portions of this examination were limited- see technologist comments above.  - No cystic structure found in the popliteal fossa.  *See table(s) above for measurements and observations. Electronically signed by  Jamelle Haring on 05/03/2022 at 4:33:57 PM.    Final    CT Head Wo Contrast  Result Date: 05/03/2022 CLINICAL DATA:  Head trauma, moderate-severe; Neck trauma (Age >= 65y). Multiple recent falls. Currently undergoing treatment for oropharyngeal cancer. EXAM: CT HEAD WITHOUT CONTRAST CT CERVICAL SPINE WITHOUT CONTRAST TECHNIQUE: Multidetector CT imaging of the head and cervical spine was performed following the standard protocol without intravenous contrast. Multiplanar CT image reconstructions of the cervical spine were also generated. RADIATION DOSE REDUCTION: This exam was performed according to the departmental dose-optimization program which includes automated exposure control, adjustment of the mA and/or kV according to patient size and/or use of iterative reconstruction technique. COMPARISON:  Neck CT 02/02/2022 FINDINGS: CT HEAD FINDINGS Brain: There is no evidence of an acute infarct, intracranial hemorrhage, mass, midline shift, or extra-axial fluid collection. The ventricles and sulci are within normal limits for age. Vascular: Calcified atherosclerosis at the skull base. No hyperdense vessel. Skull: No acute fracture or suspicious osseous lesion. Sinuses/Orbits: Mild right ethmoid air cell mucosal thickening. Clear mastoid air cells. Unremarkable orbits. Other: None. CT CERVICAL SPINE FINDINGS Alignment: Normal. Skull base and vertebrae: No acute fracture or suspicious osseous lesion. Soft tissues and spinal canal: No prevertebral fluid or swelling. No visible canal hematoma. Disc levels: Moderate cervical spondylosis and mild-to-moderate facet arthrosis. Multilevel neural foraminal stenosis due to uncovertebral spurring, moderate to severe on the right greater than left at C5-6, left greater than right at C6-7, and on the right at C7-T1. Upper chest: Clear lung apices. Other: Post treatment changes in the neck with mucosal edema. Decreased size of the left level II nodal mass and decreased size or  possible resolution of the left-sided oropharyngeal mass, not primarily evaluated on this study. IMPRESSION: No evidence of acute intracranial abnormality or cervical spine fracture. Electronically Signed   By: Logan Bores M.D.   On: 05/03/2022 10:38   CT Cervical Spine Wo Contrast  Result Date: 05/03/2022 CLINICAL DATA:  Head trauma, moderate-severe; Neck trauma (Age >= 65y). Multiple recent falls. Currently undergoing treatment for oropharyngeal cancer. EXAM: CT HEAD WITHOUT CONTRAST CT CERVICAL SPINE WITHOUT CONTRAST TECHNIQUE: Multidetector CT imaging of the head and cervical spine was performed following the standard protocol without intravenous contrast. Multiplanar CT image reconstructions of the cervical spine were also generated. RADIATION DOSE REDUCTION: This exam was performed according to the departmental dose-optimization program which includes automated exposure control, adjustment of the mA and/or kV according to patient size and/or use of iterative reconstruction technique. COMPARISON:  Neck CT 02/02/2022 FINDINGS: CT HEAD FINDINGS Brain: There is no evidence of an acute infarct, intracranial hemorrhage, mass, midline shift, or extra-axial fluid collection. The ventricles and sulci are within normal limits for age. Vascular: Calcified atherosclerosis at the skull base. No hyperdense vessel. Skull: No acute fracture or suspicious osseous lesion. Sinuses/Orbits: Mild right ethmoid air cell mucosal thickening. Clear mastoid air cells. Unremarkable orbits. Other: None. CT CERVICAL SPINE FINDINGS Alignment: Normal. Skull base and vertebrae: No acute fracture or suspicious osseous lesion. Soft tissues and spinal  canal: No prevertebral fluid or swelling. No visible canal hematoma. Disc levels: Moderate cervical spondylosis and mild-to-moderate facet arthrosis. Multilevel neural foraminal stenosis due to uncovertebral spurring, moderate to severe on the right greater than left at C5-6, left greater  than right at C6-7, and on the right at C7-T1. Upper chest: Clear lung apices. Other: Post treatment changes in the neck with mucosal edema. Decreased size of the left level II nodal mass and decreased size or possible resolution of the left-sided oropharyngeal mass, not primarily evaluated on this study. IMPRESSION: No evidence of acute intracranial abnormality or cervical spine fracture. Electronically Signed   By: Logan Bores M.D.   On: 05/03/2022 10:38   DG Chest 2 View  Result Date: 05/03/2022 CLINICAL DATA:  Cough, weakness EXAM: CHEST - 2 VIEW COMPARISON:  11/08/2018 FINDINGS: Right IJ approach chest port with distal tip terminating the level of the distal SVC. Left-sided implanted cardiac device remains in place. Cardiomegaly. No focal airspace consolidation, pleural effusion, or pneumothorax. IMPRESSION: No active cardiopulmonary disease. Electronically Signed   By: Davina Poke D.O.   On: 05/03/2022 10:25     Medical Consultants:   None.   Subjective:    [name] no complaints feels about the same.  Objective:    Vitals:   05/03/22 2230 05/04/22 0200 05/04/22 0215 05/04/22 0545  BP: 106/71 117/72 100/63 98/69  Pulse: (!) 102 (!) 104 99 83  Resp: '16 16  18  '$ Temp:  98.5 F (36.9 C)    TempSrc:  Oral    SpO2: 97% 97% 93% 99%  Weight:      Height:       SpO2: 99 %  No intake or output data in the 24 hours ending 05/04/22 0647 Filed Weights   05/03/22 0949  Weight: 121.6 kg    Exam: General exam: In no acute distress. Respiratory system: Good air movement and clear to auscultation. Cardiovascular system: S1 & S2 heard, RRR. No JVD. Gastrointestinal system: Abdomen is nondistended, soft and nontender.  Extremities:  3+ edema Skin: No rashes, lesions or ulcers Psychiatry: Judgement and insight appear normal. Mood & affect appropriate.    Data Reviewed:    Labs: Basic Metabolic Panel: Recent Labs  Lab 05/03/22 1040 05/03/22 1452 05/03/22 2249  05/04/22 0200 05/04/22 0548  NA 114* 115* 116* 117* 117*  K 4.9  --   --   --  4.3  CL 76*  --   --   --  81*  CO2 28  --   --   --  27  GLUCOSE 108*  --   --   --  110*  BUN 23  --   --   --  21  CREATININE 0.69  --   --  0.70 0.71  CALCIUM 8.7*  --   --   --  8.4*  MG  --  1.2*  --   --   --   PHOS  --  3.2  --   --   --    GFR Estimated Creatinine Clearance: 105.8 mL/min (by C-G formula based on SCr of 0.71 mg/dL). Liver Function Tests: Recent Labs  Lab 05/03/22 1040  AST 62*  ALT 39  ALKPHOS 57  BILITOT 0.8  PROT 6.3*  ALBUMIN 3.1*   Recent Labs  Lab 05/03/22 1040  LIPASE 31   No results for input(s): "AMMONIA" in the last 168 hours. Coagulation profile No results for input(s): "INR", "PROTIME" in the last  168 hours. COVID-19 Labs  No results for input(s): "DDIMER", "FERRITIN", "LDH", "CRP" in the last 72 hours.  Lab Results  Component Value Date   SARSCOV2NAA NEGATIVE 05/03/2022   [place] NEGATIVE 10/08/2019   [place] NEGATIVE 11/05/2018    CBC: Recent Labs  Lab 05/03/22 1040 05/04/22 0200 05/04/22 0548  WBC 5.2 5.8 4.2  NEUTROABS 3.4  --  2.7  HGB 10.1* 9.3* 9.0*  HCT 27.3* 25.3* 24.5*  MCV 88.6 89.1 89.7  PLT 188 167 175   Cardiac Enzymes: No results for input(s): "CKTOTAL", "CKMB", "CKMBINDEX", "TROPONINI" in the last 168 hours. BNP (last 3 results) No results for input(s): "PROBNP" in the last 8760 hours. CBG: No results for input(s): "GLUCAP" in the last 168 hours. D-Dimer: No results for input(s): "DDIMER" in the last 72 hours. Hgb A1c: No results for input(s): "HGBA1C" in the last 72 hours. Lipid Profile: No results for input(s): "CHOL", "HDL", "LDLCALC", "TRIG", "CHOLHDL", "LDLDIRECT" in the last 72 hours. Thyroid function studies: No results for input(s): "TSH", "T4TOTAL", "T3FREE", "THYROIDAB" in the last 72 hours.  Invalid input(s): "FREET3" Anemia work up: No results for input(s): "VITAMINB12", "FOLATE", "FERRITIN",  "TIBC", "IRON", "RETICCTPCT" in the last 72 hours. Sepsis Labs: Recent Labs  Lab 05/03/22 1040 05/04/22 0200 05/04/22 0548  WBC 5.2 5.8 4.2  LATICACIDVEN 1.1  --   --    Microbiology Recent Results (from the past 240 hour(s))  Resp panel by RT-PCR (RSV, Flu A&B, Covid) Peripheral     Status: None   Collection Time: 05/03/22  9:50 AM   Specimen: Peripheral; Nasal Swab  Result Value Ref Range Status   SARS Coronavirus 2 by RT PCR NEGATIVE NEGATIVE Final    Comment: (NOTE) SARS-CoV-2 target nucleic acids are NOT DETECTED.  The SARS-CoV-2 RNA is generally detectable in upper respiratory specimens during the acute phase of infection. The lowest concentration of SARS-CoV-2 viral copies this assay can detect is 138 copies/mL. A negative result does not preclude SARS-Cov-2 infection and should not be used as the sole basis for treatment or other patient management decisions. A negative result may occur with  improper specimen collection/handling, submission of specimen other than nasopharyngeal swab, presence of viral mutation(s) within the areas targeted by this assay, and inadequate number of viral copies(<138 copies/mL). A negative result must be combined with clinical observations, patient history, and epidemiological information. The expected result is Negative.  Fact Sheet for Patients:  EntrepreneurPulse.com.au  Fact Sheet for Healthcare Providers:  IncredibleEmployment.be  This test is no t yet approved or cleared by the [place] FDA and  has been authorized for detection and/or diagnosis of SARS-CoV-2 by FDA under an Emergency Use Authorization (EUA). This EUA will remain  in effect (meaning this test can be used) for the duration of the COVID-19 declaration under Section 564(b)(1) of the Act, 21 U.S.C.section 360bbb-3(b)(1), unless the authorization is terminated  or revoked sooner.       Influenza A by PCR NEGATIVE NEGATIVE  Final   Influenza B by PCR NEGATIVE NEGATIVE Final    Comment: (NOTE) The Xpert Xpress SARS-CoV-2/FLU/RSV plus assay is intended as an aid in the diagnosis of influenza from Nasopharyngeal swab specimens and should not be used as a sole basis for treatment. Nasal washings and aspirates are unacceptable for Xpert Xpress SARS-CoV-2/FLU/RSV testing.  Fact Sheet for Patients: EntrepreneurPulse.com.au  Fact Sheet for Healthcare Providers: IncredibleEmployment.be  This test is not yet approved or cleared by the [place] and has been authorized for  detection and/or diagnosis of SARS-CoV-2 by FDA under an Emergency Use Authorization (EUA). This EUA will remain in effect (meaning this test can be used) for the duration of the COVID-19 declaration under Section 564(b)(1) of the Act, 21 U.S.C. section 360bbb-3(b)(1), unless the authorization is terminated or revoked.     Resp Syncytial Virus by PCR NEGATIVE NEGATIVE Final    Comment: (NOTE) Fact Sheet for Patients: EntrepreneurPulse.com.au  Fact Sheet for Healthcare Providers: IncredibleEmployment.be  This test is not yet approved or cleared by the [place] FDA and has been authorized for detection and/or diagnosis of SARS-CoV-2 by FDA under an Emergency Use Authorization (EUA). This EUA will remain in effect (meaning this test can be used) for the duration of the COVID-19 declaration under Section 564(b)(1) of the Act, 21 U.S.C. section 360bbb-3(b)(1), unless the authorization is terminated or revoked.  Performed at [facility], [place] [address] [place], [place] [address]      Medications:    aspirin EC  81 mg Oral Daily   enoxaparin (LOVENOX) injection  40 mg Subcutaneous Q24H   feeding supplement  237 mL Per Tube TID BM   isosorbide mononitrate  15 mg Oral Daily   multivitamin with minerals  1 tablet Oral Daily    rosuvastatin  10 mg Oral Daily   tamsulosin  0.4 mg Oral Daily   Continuous Infusions:  sodium chloride 75 mL/hr at 05/04/22 0339   cefTRIAXone (ROCEPHIN)  IV        LOS: 1 day   Charlynne Cousins  [facility]  05/04/2022, 6:47 AM

## 2022-05-05 ENCOUNTER — Encounter: Payer: Self-pay | Admitting: Cardiology

## 2022-05-05 ENCOUNTER — Encounter (HOSPITAL_COMMUNITY): Payer: Self-pay | Admitting: Internal Medicine

## 2022-05-05 ENCOUNTER — Ambulatory Visit (INDEPENDENT_AMBULATORY_CARE_PROVIDER_SITE_OTHER): Payer: PPO

## 2022-05-05 ENCOUNTER — Inpatient Hospital Stay (HOSPITAL_COMMUNITY): Payer: PPO

## 2022-05-05 DIAGNOSIS — I442 Atrioventricular block, complete: Secondary | ICD-10-CM

## 2022-05-05 DIAGNOSIS — R627 Adult failure to thrive: Secondary | ICD-10-CM | POA: Diagnosis not present

## 2022-05-05 DIAGNOSIS — E876 Hypokalemia: Secondary | ICD-10-CM | POA: Diagnosis not present

## 2022-05-05 DIAGNOSIS — E871 Hypo-osmolality and hyponatremia: Secondary | ICD-10-CM | POA: Diagnosis not present

## 2022-05-05 DIAGNOSIS — E440 Moderate protein-calorie malnutrition: Secondary | ICD-10-CM | POA: Insufficient documentation

## 2022-05-05 LAB — URINE CULTURE: Culture: >=100000 — AB

## 2022-05-05 LAB — GLUCOSE, CAPILLARY
Glucose-Capillary: 116 mg/dL — ABNORMAL HIGH (ref 70–99)
Glucose-Capillary: 112 mg/dL — ABNORMAL HIGH (ref 70–99)

## 2022-05-05 LAB — BASIC METABOLIC PANEL
Anion gap: 8 (ref 5–15)
BUN: 11 mg/dL (ref 8–23)
CO2: 28 mmol/L (ref 22–32)
Calcium: 8.3 mg/dL — ABNORMAL LOW (ref 8.9–10.3)
Chloride: 87 mmol/L — ABNORMAL LOW (ref 98–111)
Creatinine, Ser: 0.48 mg/dL — ABNORMAL LOW (ref 0.61–1.24)
GFR, Estimated: >60 mL/min (ref >60)
Glucose, Bld: 106 mg/dL — ABNORMAL HIGH (ref 70–99)
Potassium: 4.3 mmol/L (ref 3.5–5.1)
Sodium: 123 mmol/L — ABNORMAL LOW (ref 135–145)

## 2022-05-05 LAB — CREATININE, URINE, RANDOM: Creatinine, Urine: 42 mg/dL

## 2022-05-05 LAB — SODIUM, URINE, RANDOM: Sodium, Ur: 47 mmol/L

## 2022-05-05 LAB — SODIUM
Sodium: 123 mmol/L — ABNORMAL LOW (ref 135–145)
Sodium: 123 mmol/L — ABNORMAL LOW (ref 135–145)
Sodium: 125 mmol/L — ABNORMAL LOW (ref 135–145)
Sodium: 125 mmol/L — ABNORMAL LOW (ref 135–145)
Sodium: 127 mmol/L — ABNORMAL LOW (ref 135–145)

## 2022-05-05 LAB — PHOSPHORUS: Phosphorus: 3.8 mg/dL (ref 2.5–4.6)

## 2022-05-05 LAB — MAGNESIUM: Magnesium: 1.6 mg/dL — ABNORMAL LOW (ref 1.7–2.4)

## 2022-05-05 MED ORDER — FREE WATER
100.0000 mL | Freq: Three times a day (TID) | Status: DC
  Administered 2022-05-05 – 2022-05-07 (×7): 100 mL

## 2022-05-05 MED ORDER — CEPHALEXIN 500 MG PO CAPS
500.0000 mg | ORAL_CAPSULE | Freq: Three times a day (TID) | ORAL | Status: DC
  Administered 2022-05-05 – 2022-05-07 (×6): 500 mg via ORAL
  Filled 2022-05-05 (×8): qty 1

## 2022-05-05 MED ORDER — PROSOURCE TF20 ENFIT COMPATIBL EN LIQD
60.0000 mL | Freq: Two times a day (BID) | ENTERAL | Status: DC
  Administered 2022-05-06 – 2022-05-07 (×3): 60 mL
  Filled 2022-05-05 (×3): qty 60

## 2022-05-05 MED ORDER — OSMOLITE 1.5 CAL PO LIQD
474.0000 mL | Freq: Three times a day (TID) | ORAL | Status: DC
  Administered 2022-05-05 – 2022-05-07 (×7): 474 mL
  Filled 2022-05-05 (×8): qty 474

## 2022-05-05 MED ORDER — SODIUM CHLORIDE 0.9 % IV SOLN: 2.0000 g | INTRAVENOUS | Status: DC

## 2022-05-05 NOTE — TOC Initial Note (Signed)
Transition of Care [facility]) - Initial/Assessment Note    Patient Details  Name: Clarence Andrade MRN: [id number] Date of Birth: 11/19/1945  Transition of Care [facility]) CM/SW Contact:    Leeroy Cha, RN Phone Number: 05/05/2022, 7:57 AM  Clinical Narrative:                  Transition of Care [facility]) Screening Note   Patient Details  Name: Clarence Andrade Date of Birth: [birth year]   Transition of Care [facility]) CM/SW Contact:    Leeroy Cha, RN Phone Number: 05/05/2022, 7:57 AM    Transition of Care Department [facility]) has reviewed patient and no TOC needs have been identified at this time. We will continue to monitor patient advancement through interdisciplinary progression rounds. If new patient transition needs arise, please place a TOC consult.    Expected Discharge Plan: Home/Self Care Barriers to Discharge: Continued Medical Work up   Patient Goals and CMS Choice Patient states their goals for this hospitalization and ongoing recovery are:: to return to my home and be well          Expected Discharge Plan and Services   Discharge Planning Services: CM Consult   Living arrangements for the past 2 months: [place]                                      Prior Living Arrangements/Services Living arrangements for the past 2 months: Single Family Home Lives with:: Spouse Patient language and need for interpreter reviewed:: Yes Do you feel safe going back to the place where you live?: Yes      Need for Family Participation in Patient Care: No (Comment) Care giver support system in place?: Yes (comment) (wife)   Criminal Activity/Legal Involvement Pertinent to Current Situation/Hospitalization: No - Comment as needed  Activities of Daily Living Home Assistive Devices/Equipment: Environmental consultant (specify type), Shower chair with back, Feeding equipment, Enteral Feeding Supplies ADL Screening (condition at time of admission) Patient's cognitive ability  adequate to safely complete daily activities?: Yes Is the patient deaf or have difficulty hearing?: No Does the patient have difficulty seeing, even when wearing glasses/contacts?: No Does the patient have difficulty concentrating, remembering, or making decisions?: Yes Patient able to express need for assistance with ADLs?: Yes Does the patient have difficulty dressing or bathing?: No Independently performs ADLs?: Yes (appropriate for developmental age) Does the patient have difficulty walking or climbing stairs?: Yes Weakness of Legs: Both Weakness of Arms/Hands: Both  Permission Sought/Granted                  Emotional Assessment Appearance:: Appears stated age Attitude/Demeanor/Rapport: Engaged Affect (typically observed): Calm Orientation: : Oriented to Self, Oriented to Place, Oriented to  Time, Oriented to Situation Alcohol / Substance Use: Tobacco Use (former smoker quit 31 years ago) Psych Involvement: No (comment)  Admission diagnosis:  Hypokalemia [E87.6] Hyponatremia [E87.1] Recurrent falls [R29.6] Patient Active Problem List   Diagnosis Date Noted   Hypokalemia 05/03/2022   Hyponatremia 05/03/2022   Port-A-Cath in place 04/19/2022   Enlarged prostate 03/29/2022   Constipation 03/15/2022   Encounter for fitting or adjustment of dental prosthetic device 03/01/2022   Exostosis of jaw 02/22/2022   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   Teeth missing 02/22/2022   Accretions on teeth 02/22/2022   Chronic periodontitis 02/22/2022   Gingival recession, localized 02/22/2022   Excessive dental attrition 02/22/2022  Abfraction 02/22/2022   Malocclusion 02/22/2022   Incipient enamel caries 02/22/2022   Encounter for preoperative dental examination 02/21/2022   Malignant neoplasm of base of tongue ([place]) 02/18/2022   Squamous cell carcinoma of oropharynx (HCC) 02/17/2022   Stable angina 03/23/2018   Abnormal stress test 03/23/2018   Aortic stenosis    Pulmonary HTN  (HCC)    Exertional chest pain 03/07/2018   Tear of right rotator cuff 08/14/2017   Coronary artery calcification seen on CAT scan 07/03/2013   Heart murmur 07/03/2013   Pericarditis 06/12/2013   PCP:  Kathalene Frames, MD Pharmacy:   [facility], [place] [address] Phone: [phone] Fax: [phone]     Social Determinants of Health (SDOH) Social History: SDOH Screenings   Food Insecurity: No Food Insecurity (05/04/2022)  Housing: Low Risk  (05/04/2022)  Transportation Needs: No Transportation Needs (05/04/2022)  Utilities: Not At Risk (05/04/2022)  Financial Resource Strain: Low Risk  (02/24/2022)  Tobacco Use: Medium Risk (05/03/2022)   SDOH Interventions:     Readmission Risk Interventions   No data to display

## 2022-05-05 NOTE — Progress Notes (Signed)
Pharmacy Brief Note - Hypertonic Saline Monitoring:  Pt receiving hypertonic 3% saline @ 45 mL/hr. Na = 127 @ 1900 this evening.   Confirmed with Dr. Jonnie Finner to continue hypertonic saline with Na = 127. Verbal order to discontinue hypertonic saline if Na >127. Continue q4h Na checks.  Lenis Noon, PharmD 05/05/22 7:36 PM

## 2022-05-05 NOTE — Progress Notes (Signed)
IP PROGRESS NOTE  Subjective:   Clarence Andrade is followed by Dr. Chryl Heck for squamous cell carcinoma of the oropharynx, clinical stage T2 N1 M0.  He completed a course of weekly cisplatin and radiation earlier this month.  He has a feeding tube in place in order to remain taking his nutrition in the setting of radiation mucositis.  His wife is at the bedside.  Clarence Andrade follow-up confusion beginning approximately 1 week ago.  He had multiple falls at home.  He presented to the emergency room 05/03/2022.  The sodium returned at 114.  He was admitted for hyponatremia. He began a scopolamine patch for treatment of excessive oropharyngeal secretions concurrent with the time he became confused.  A urinalysis is consistent with a urinary tract infection.  Urine culture returned positive for Klebsiella.  Nephrology was consulted and he was treated with saline hydration.  The sodium has improved.  His mental status has improved.  He reports no significant diarrhea prior to hospital admission.  He had been compliant with tube feedings.    Objective: Vital signs in last 24 hours: Blood pressure (!) 149/62, pulse 87, temperature 97.8 F (36.6 C), temperature source Oral, resp. rate 14, height 6' (1.829 m), weight 266 lb 1.5 oz (120.7 kg), SpO2 97 %.  Intake/Output from previous day: 12/27 0701 - 12/28 0700 In: 581.9 [I.V.:481.9; IV Piggyback:100] Out: 1175 [Urine:1175]  Physical Exam:  HEENT: Thrush or ulcers Lungs: Clear bilaterally Cardiac: Regular rate and rhythm Abdomen: No hepatosplenomegaly, nontender, feeding tube site without evidence of infection Extremities: Trace pitting edema at the lower leg bilaterally Lymph nodes: No cervical, supraclavicular, axillary, or inguinal nodes Neurologic: Alert and oriented, the motor exam appears intact in the upper and lower extremities bilaterally, finger-to-nose testing is normal   Lab Results: Recent Labs    05/04/22 0200 05/04/22 0548  WBC 5.8  4.2  HGB 9.3* 9.0*  HCT 25.3* 24.5*  PLT 167 175    BMET Recent Labs    05/04/22 0548 05/04/22 1012 05/05/22 0430 05/05/22 0600 05/05/22 1035 05/05/22 1545  NA 117*   < > 123*   < > 125* 125*  K 4.3  --  4.3  --   --   --   CL 81*  --  87*  --   --   --   CO2 27  --  28  --   --   --   GLUCOSE 110*  --  106*  --   --   --   BUN 21  --  11  --   --   --   CREATININE 0.71  --  0.48*  --   --   --   CALCIUM 8.4*  --  8.3*  --   --   --    < > = values in this interval not displayed.    No results found for: "CEA1", "CEA", "ZWC585", "CA125"  Studies/Results: DG CHEST PORT 1 VIEW  Result Date: 05/05/2022 CLINICAL DATA:  Shortness of breath EXAM: PORTABLE CHEST 1 VIEW COMPARISON:  05/03/2022 FINDINGS: Left pacer and right Port-A-Cath remain in place, unchanged. Heart and mediastinal contours are within normal limits. No focal opacities or effusions. No acute bony abnormality. IMPRESSION: No active cardiopulmonary disease. Electronically Signed   By: Rolm Baptise M.D.   On: 05/05/2022 15:22   ECHOCARDIOGRAM COMPLETE  Result Date: 05/04/2022    ECHOCARDIOGRAM REPORT   Patient Name:   Clarence Andrade Date of Exam: 05/04/2022 Medical  Rec #:  [id number]       Height:       72.0 in Accession #:    [id number]      Weight:       268.0 lb Date of Birth:  1945-11-16       BSA:          2.413 m Patient Age:    76 years        BP:           131/96 mmHg Patient Gender: M               HR:           99 bpm. Exam Location:  Inpatient Procedure: 2D Echo and Intracardiac Opacification Agent Indications:    CHF  History:        Patient has prior history of Echocardiogram examinations, most                 recent 03/10/2020. Signs/Symptoms:Murmur.  Sonographer:    Harvie Junior Referring Phys: [id number] FANG XU  Sonographer Comments: Technically difficult study due to poor echo windows and patient is obese. Image acquisition challenging due to patient body habitus and Image acquisition challenging due to  respiratory motion. IMPRESSIONS  1. Limited study due to poor echo windows.  2. Left ventricular ejection fraction, by estimation, is 55 to 60%. The left ventricle has normal function. The left ventricle has no regional wall motion abnormalities. There is mild concentric left ventricular hypertrophy. Left ventricular diastolic parameters are indeterminate.  3. Right ventricular systolic function is normal. The right ventricular size is not well visualized. There is normal pulmonary artery systolic pressure.  4. The mitral valve is grossly normal. Trivial mitral valve regurgitation. No evidence of mitral stenosis.  5. There is at least mild aortic stenosis and mean gradient may be underestimated due to suboptimal doppler angle of interrogation for both LVOT VTI and AoV VTI. The aortic valve is calcified. Aortic valve regurgitation is not visualized. There is at least mild aortic stenosis. Aortic valve mean gradient measures 15.7 mmHg. Aortic valve Vmax measures 2.53 m/s.  6. Aortic dilatation noted. There is borderline dilatation of the aortic root, measuring 40 mm. Comparison(s): Compared to prior TTE in 2021, there continues to be at least mild AS (prior mean gradient 58mHg). Otherwise, there is no significant change. FINDINGS  Left Ventricle: Left ventricular ejection fraction, by estimation, is 55 to 60%. The left ventricle has normal function. The left ventricle has no regional wall motion abnormalities. The left ventricular internal cavity size was normal in size. There is  mild concentric left ventricular hypertrophy. Left ventricular diastolic parameters are indeterminate. Right Ventricle: The right ventricular size is not well visualized. Right vetricular wall thickness was not well visualized. Right ventricular systolic function is normal. There is normal pulmonary artery systolic pressure. The tricuspid regurgitant velocity is 2.77 m/s, and with an assumed right atrial pressure of 3 mmHg, the estimated  right ventricular systolic pressure is 331.4mmHg. Left Atrium: Left atrial size was not well visualized. Right Atrium: Right atrial size was not well visualized. Pericardium: There is no evidence of pericardial effusion. Mitral Valve: The mitral valve is grossly normal. There is mild thickening of the mitral valve leaflet(s). There is mild calcification of the mitral valve leaflet(s). Trivial mitral valve regurgitation. No evidence of mitral valve stenosis. Tricuspid Valve: The tricuspid valve is grossly normal. Tricuspid valve regurgitation is trivial. Aortic Valve: There is at least mild aortic  stenosis and mean gradient may be underestimated due to suboptimal doppler angle of interrogation for both LVOT VTI and AoV VTI. The aortic valve is calcified. Aortic valve regurgitation is not visualized. There is at least mild aortic stenosis. Aortic valve mean gradient measures 15.7 mmHg. Aortic valve peak gradient measures 25.6 mmHg. Aortic valve area, by VTI measures 1.74 cm. Pulmonic Valve: The pulmonic valve was not well visualized. Aorta: Aortic dilatation noted. There is borderline dilatation of the aortic root, measuring 40 mm. IAS/Shunts: The atrial septum is grossly normal.  LEFT VENTRICLE PLAX 2D LVIDd:         4.20 cm      Diastology LVIDs:         2.60 cm      LV e' medial:    9.90 cm/s LV PW:         1.30 cm      LV E/e' medial:  8.7 LV IVS:        1.30 cm      LV e' lateral:   11.40 cm/s LVOT diam:     2.30 cm      LV E/e' lateral: 7.6 LV SV:         66 LV SV Index:   28 LVOT Area:     4.15 cm  LV Volumes (MOD) LV vol d, MOD A4C: 104.0 ml LV vol s, MOD A4C: 50.1 ml LV SV MOD A4C:     104.0 ml RIGHT VENTRICLE RV S prime:     15.10 cm/s TAPSE (M-mode): 2.1 cm LEFT ATRIUM             Index        RIGHT ATRIUM           Index LA Vol (A2C):   57.2 ml 23.71 ml/m  RA Area:     17.40 cm LA Vol (A4C):   62.0 ml 25.70 ml/m  RA Volume:   47.50 ml  19.69 ml/m LA Biplane Vol: 60.4 ml 25.03 ml/m  AORTIC VALVE                      PULMONIC VALVE AV Area (Vmax):    1.44 cm      PV Vmax:       1.37 m/s AV Area (Vmean):   1.79 cm      PV Peak grad:  7.5 mmHg AV Area (VTI):     1.74 cm AV Vmax:           252.80 cm/s AV Vmean:          163.667 cm/s AV VTI:            0.382 m AV Peak Grad:      25.6 mmHg AV Mean Grad:      15.7 mmHg LVOT Vmax:         87.50 cm/s LVOT Vmean:        70.500 cm/s LVOT VTI:          0.160 m LVOT/AV VTI ratio: 0.42  AORTA Ao Root diam: 3.60 cm MITRAL VALVE               TRICUSPID VALVE MV Area (PHT): 6.17 cm    TR Peak grad:   30.7 mmHg MV Decel Time: 123 msec    TR Vmax:        277.00 cm/s Clarence Peak grad: 76.2 mmHg Clarence Vmax:      436.33 cm/s  SHUNTS  MV E velocity: 86.40 cm/s  Systemic VTI:  0.16 m MV A velocity: 50.30 cm/s  Systemic Diam: 2.30 cm MV E/A ratio:  1.72 Gwyndolyn Kaufman MD Electronically signed by Gwyndolyn Kaufman MD Signature Date/Time: 05/04/2022/4:45:28 PM    Final     Medications: I have reviewed the patient's current medications.  Assessment/Plan: Oropharyngeal cancer, T2 N1, status post cisplatin and radiation, last cisplatin 04/13/2022, last radiation 04/25/2022 CT cervical spine 05/03/2022-posttreatment change in the left neck, decreased size of left level 2 nodal mass and decreased size of left sided oropharyngeal mass Odynophagia secondary to radiation, feeding tube in place Admission 05/03/2022 with altered mental status secondary to hyponatremia Hyponatremia Hypomagnesemia Klebsiella UTI 05/03/2022 Hypertension  Clarence Scholze recently completed treatment for head neck cancer.  There is no clinical or radiologic evidence for progression of the head neck cancer.  He was admitted with altered mental status, likely secondary to hyponatremia.  The hyponatremia appears to be unrelated to dehydration.  I suspect there is a component of SIADH potentially related to cisplatin chemotherapy.  The hypomagnesemia is likely secondary to cisplatin.  He has been evaluated by  nephrology.  I recommend continuing saline hydration with close monitoring of the serum sodium level.  He may be a candidate for outpatient management with fluid restriction, salt tablets, and close monitoring of the sodium level.  I will defer continued management of the hyponatremia to Dr. Jonnie Finner.  Hopefully the hyponatremia will improve over the next few weeks if this is cisplatin related.  Recommendations: Continue saline hydration, management of hyponatremia per nephrology Outpatient follow-up will be scheduled at the Cancer center with Dr. Chryl Heck, we can arrange for follow-up of the sodium level when he is discharged  LOS: 2 days   Betsy Coder, MD   05/05/2022, 7:12 PM

## 2022-05-05 NOTE — Progress Notes (Addendum)
[facility] PROGRESS NOTE    Progress Note  Clarence Andrade  BSW:[id number] DOB: 05/30/1945 DOA: 05/03/2022 PCP: Clarence Frames, MD     Brief Narrative:   Clarence Andrade is an 76 y.o. male past medical history of essential hypertension, squamous cell carcinoma of the oropharynx finished chemotherapy and radiation therapy this month by Clarence Andrade, with a PEG tube in place comes in with progressive weakness and hallucinations along with falls since she was recently prescribed scopolamine patch.  She has had orthostatic symptoms for the last several days along with a cough.  With hyponatremia nephrology was consulted    Assessment/Plan:   Hyponatremia frequent falls and unsteady gait: Nephrology was consulted who recommended urinary osmolarity, sodium and creatinine. IV fluids were KVO. He was started on 3% saline his sodium this morning is 123. Appreciate nephrology's assistance. Has remained afebrile urine cultures and blood cultures remain negative to date. Imaging showed no fracture or dislocation.  Hypomagnesemia: Replete orally recheck in the morning.  Bilateral lower extremity edema: Lower extremity Doppler negative 2D echo EF of 46% mild diastolic dysfunction.  Right ventricular function is normal.  Essential hypertension: Was recently taken off Lopressor lisinopril and Flomax. Hold Imdur. His blood pressure stable on current agent.  CAD: Continue to hold Imdur continue aspirin and statins.  Recently treated for UTI: Urine culture is growing Klebsiella will transition to oral Keflex.  BPH: Flomax was held about 2 months ago due to hypotension, restarted on this admission.  Squamous cell carcinoma of the oropharynx: Need to follow-up with oncology as an outpatient.  Failure to thrive/weakness/falls: Consult PT OT might need skilled upon discharge.  Nutrition: Consult nutrition for assessment and tube feedings.   DVT prophylaxis:  lovenox Family Communication:e Status is: Inpatient Remains inpatient appropriate because: Hyponatremia    Code Status:     Code Status Orders  (From admission, onward)           Start     Ordered   05/03/22 1804  Full code  Continuous       Question:  By:  Answer:  Consent: discussion documented in EHR   05/03/22 1803           Code Status History     Date Active Date Inactive Code Status Order ID Comments User Context   10/11/2019 1050 10/12/2019 0002 Full Code [id number]  Armandina Gemma, MD Inpatient   11/08/2018 1441 11/08/2018 2157 Full Code [id number]  Constance Haw, MD Inpatient   03/23/2018 0855 03/23/2018 1454 Full Code [id number]  Nelva Bush, MD Inpatient         IV Access:   Peripheral IV   Procedures and diagnostic studies:   ECHOCARDIOGRAM COMPLETE  Result Date: 05/04/2022    ECHOCARDIOGRAM REPORT   Patient Name:   Clarence Andrade Date of Exam: 05/04/2022 Medical Rec #:  [id number]       Height:       72.0 in Accession #:    [id number]      Weight:       268.0 lb Date of Birth:  1946-04-17       BSA:          2.413 m Patient Age:    70 years        BP:           131/96 mmHg Patient Gender: M               HR:  99 bpm. Exam Location:  Inpatient Procedure: 2D Echo and Intracardiac Opacification Agent Indications:    CHF  History:        Patient has prior history of Echocardiogram examinations, most                 recent 03/10/2020. Signs/Symptoms:Murmur.  Sonographer:    Clarence Andrade Referring Phys: [id number] Clarence Andrade  Sonographer Comments: Technically difficult study due to poor echo windows and patient is obese. Image acquisition challenging due to patient body habitus and Image acquisition challenging due to respiratory motion. IMPRESSIONS  1. Limited study due to poor echo windows.  2. Left ventricular ejection fraction, by estimation, is 55 to 60%. The left ventricle has normal function. The left ventricle has no regional wall motion  abnormalities. There is mild concentric left ventricular hypertrophy. Left ventricular diastolic parameters are indeterminate.  3. Right ventricular systolic function is normal. The right ventricular size is not well visualized. There is normal pulmonary artery systolic pressure.  4. The mitral valve is grossly normal. Trivial mitral valve regurgitation. No evidence of mitral stenosis.  5. There is at least mild aortic stenosis and mean gradient may be underestimated due to suboptimal doppler angle of interrogation for both LVOT VTI and AoV VTI. The aortic valve is calcified. Aortic valve regurgitation is not visualized. There is at least mild aortic stenosis. Aortic valve mean gradient measures 15.7 mmHg. Aortic valve Vmax measures 2.53 m/s.  6. Aortic dilatation noted. There is borderline dilatation of the aortic root, measuring 40 mm. Comparison(s): Compared to prior TTE in 2021, there continues to be at least mild AS (prior mean gradient 19mHg). Otherwise, there is no significant change. FINDINGS  Left Ventricle: Left ventricular ejection fraction, by estimation, is 55 to 60%. The left ventricle has normal function. The left ventricle has no regional wall motion abnormalities. The left ventricular internal cavity size was normal in size. There is  mild concentric left ventricular hypertrophy. Left ventricular diastolic parameters are indeterminate. Right Ventricle: The right ventricular size is not well visualized. Right vetricular wall thickness was not well visualized. Right ventricular systolic function is normal. There is normal pulmonary artery systolic pressure. The tricuspid regurgitant velocity is 2.77 m/s, and with an assumed right atrial pressure of 3 mmHg, the estimated right ventricular systolic pressure is 314.4mmHg. Left Atrium: Left atrial size was not well visualized. Right Atrium: Right atrial size was not well visualized. Pericardium: There is no evidence of pericardial effusion. Mitral Valve:  The mitral valve is grossly normal. There is mild thickening of the mitral valve leaflet(s). There is mild calcification of the mitral valve leaflet(s). Trivial mitral valve regurgitation. No evidence of mitral valve stenosis. Tricuspid Valve: The tricuspid valve is grossly normal. Tricuspid valve regurgitation is trivial. Aortic Valve: There is at least mild aortic stenosis and mean gradient may be underestimated due to suboptimal doppler angle of interrogation for both LVOT VTI and AoV VTI. The aortic valve is calcified. Aortic valve regurgitation is not visualized. There is at least mild aortic stenosis. Aortic valve mean gradient measures 15.7 mmHg. Aortic valve peak gradient measures 25.6 mmHg. Aortic valve area, by VTI measures 1.74 cm. Pulmonic Valve: The pulmonic valve was not well visualized. Aorta: Aortic dilatation noted. There is borderline dilatation of the aortic root, measuring 40 mm. IAS/Shunts: The atrial septum is grossly normal.  LEFT VENTRICLE PLAX 2D LVIDd:         4.20 cm      Diastology LVIDs:  2.60 cm      LV e' medial:    9.90 cm/s LV PW:         1.30 cm      LV E/e' medial:  8.7 LV IVS:        1.30 cm      LV e' lateral:   11.40 cm/s LVOT diam:     2.30 cm      LV E/e' lateral: 7.6 LV SV:         66 LV SV Index:   28 LVOT Area:     4.15 cm  LV Volumes (MOD) LV vol d, MOD A4C: 104.0 ml LV vol s, MOD A4C: 50.1 ml LV SV MOD A4C:     104.0 ml RIGHT VENTRICLE RV S prime:     15.10 cm/s TAPSE (M-mode): 2.1 cm LEFT ATRIUM             Index        RIGHT ATRIUM           Index LA Vol (A2C):   57.2 ml 23.71 ml/m  RA Area:     17.40 cm LA Vol (A4C):   62.0 ml 25.70 ml/m  RA Volume:   47.50 ml  19.69 ml/m LA Biplane Vol: 60.4 ml 25.03 ml/m  AORTIC VALVE                     PULMONIC VALVE AV Area (Vmax):    1.44 cm      PV Vmax:       1.37 m/s AV Area (Vmean):   1.79 cm      PV Peak grad:  7.5 mmHg AV Area (VTI):     1.74 cm AV Vmax:           252.80 cm/s AV Vmean:          163.667 cm/s  AV VTI:            0.382 m AV Peak Grad:      25.6 mmHg AV Mean Grad:      15.7 mmHg LVOT Vmax:         87.50 cm/s LVOT Vmean:        70.500 cm/s LVOT VTI:          0.160 m LVOT/AV VTI ratio: 0.42  AORTA Ao Root diam: 3.60 cm MITRAL VALVE               TRICUSPID VALVE MV Area (PHT): 6.17 cm    TR Peak grad:   30.7 mmHg MV Decel Time: 123 msec    TR Vmax:        277.00 cm/s MR Peak grad: 76.2 mmHg MR Vmax:      436.33 cm/s  SHUNTS MV E velocity: 86.40 cm/s  Systemic VTI:  0.16 m MV A velocity: 50.30 cm/s  Systemic Diam: 2.30 cm MV E/A ratio:  1.72 Gwyndolyn Kaufman MD Electronically signed by Gwyndolyn Kaufman MD Signature Date/Time: 05/04/2022/4:45:28 PM    Final    VAS [place] LOWER EXTREMITY VENOUS (DVT)  Result Date: 05/03/2022  Lower Venous DVT Study Patient Name:  CRISTOVAL TEALL  Date of Exam:   05/03/2022 Medical Rec #: [id number]        Accession #:    [id number] Date of Birth: [birth year]        Patient Gender: M Patient Age:   58 years Exam Location:  [facility] Procedure:  VAS [place] LOWER EXTREMITY VENOUS (DVT) Referring Phys: Annamaria Boots Andrade --------------------------------------------------------------------------------  Indications: Edema.  Risk Factors: Cancer. Limitations: Poor ultrasound/tissue interface. Comparison Study: No prior studies. Performing Technologist: Oliver Hum RVT  Examination Guidelines: A complete evaluation includes B-mode imaging, spectral Doppler, color Doppler, and power Doppler as needed of all accessible portions of each vessel. Bilateral testing is considered an integral part of a complete examination. Limited examinations for reoccurring indications may be performed as noted. The reflux portion of the exam is performed with the patient in reverse Trendelenburg.  +---------+---------------+---------+-----------+----------+--------------+ RIGHT    CompressibilityPhasicitySpontaneityPropertiesThrombus Aging  +---------+---------------+---------+-----------+----------+--------------+ CFV      Full           Yes      Yes                                 +---------+---------------+---------+-----------+----------+--------------+ SFJ      Full                                                        +---------+---------------+---------+-----------+----------+--------------+ FV Prox  Full                                                        +---------+---------------+---------+-----------+----------+--------------+ FV Mid   Full                                                        +---------+---------------+---------+-----------+----------+--------------+ FV DistalFull                                                        +---------+---------------+---------+-----------+----------+--------------+ PFV      Full                                                        +---------+---------------+---------+-----------+----------+--------------+ POP      Full           Yes      Yes                                 +---------+---------------+---------+-----------+----------+--------------+ PTV      Full                                                        +---------+---------------+---------+-----------+----------+--------------+ PERO     Full                                                        +---------+---------------+---------+-----------+----------+--------------+   +---------+---------------+---------+-----------+----------+-------------------+  LEFT     CompressibilityPhasicitySpontaneityPropertiesThrombus Aging      +---------+---------------+---------+-----------+----------+-------------------+ CFV      Full           Yes      Yes                                      +---------+---------------+---------+-----------+----------+-------------------+ SFJ      Full                                                              +---------+---------------+---------+-----------+----------+-------------------+ FV Prox  Full                                                             +---------+---------------+---------+-----------+----------+-------------------+ FV Mid   Full                                                             +---------+---------------+---------+-----------+----------+-------------------+ FV DistalFull                                                             +---------+---------------+---------+-----------+----------+-------------------+ PFV      Full                                                             +---------+---------------+---------+-----------+----------+-------------------+ POP      Full           Yes      Yes                                      +---------+---------------+---------+-----------+----------+-------------------+ PTV      Full                                                             +---------+---------------+---------+-----------+----------+-------------------+ PERO                                                  Not well visualized +---------+---------------+---------+-----------+----------+-------------------+     Summary: RIGHT: - There is no  evidence of deep vein thrombosis in the lower extremity. However, portions of this examination were limited- see technologist comments above.  - No cystic structure found in the popliteal fossa.  LEFT: - There is no evidence of deep vein thrombosis in the lower extremity. However, portions of this examination were limited- see technologist comments above.  - No cystic structure found in the popliteal fossa.  *See table(s) above for measurements and observations. Electronically signed by Jamelle Haring on 05/03/2022 at 4:33:57 PM.    Final    CT Head Wo Contrast  Result Date: 05/03/2022 CLINICAL DATA:  Head trauma, moderate-severe; Neck trauma (Age >= 65y). Multiple recent falls.  Currently undergoing treatment for oropharyngeal cancer. EXAM: CT HEAD WITHOUT CONTRAST CT CERVICAL SPINE WITHOUT CONTRAST TECHNIQUE: Multidetector CT imaging of the head and cervical spine was performed following the standard protocol without intravenous contrast. Multiplanar CT image reconstructions of the cervical spine were also generated. RADIATION DOSE REDUCTION: This exam was performed according to the departmental dose-optimization program which includes automated exposure control, adjustment of the mA and/or kV according to patient size and/or use of iterative reconstruction technique. COMPARISON:  Neck CT 02/02/2022 FINDINGS: CT HEAD FINDINGS Brain: There is no evidence of an acute infarct, intracranial hemorrhage, mass, midline shift, or extra-axial fluid collection. The ventricles and sulci are within normal limits for age. Vascular: Calcified atherosclerosis at the skull base. No hyperdense vessel. Skull: No acute fracture or suspicious osseous lesion. Sinuses/Orbits: Mild right ethmoid air cell mucosal thickening. Clear mastoid air cells. Unremarkable orbits. Other: None. CT CERVICAL SPINE FINDINGS Alignment: Normal. Skull base and vertebrae: No acute fracture or suspicious osseous lesion. Soft tissues and spinal canal: No prevertebral fluid or swelling. No visible canal hematoma. Disc levels: Moderate cervical spondylosis and mild-to-moderate facet arthrosis. Multilevel neural foraminal stenosis due to uncovertebral spurring, moderate to severe on the right greater than left at C5-6, left greater than right at C6-7, and on the right at C7-T1. Upper chest: Clear lung apices. Other: Post treatment changes in the neck with mucosal edema. Decreased size of the left level II nodal mass and decreased size or possible resolution of the left-sided oropharyngeal mass, not primarily evaluated on this study. IMPRESSION: No evidence of acute intracranial abnormality or cervical spine fracture. Electronically  Signed   By: Logan Bores M.D.   On: 05/03/2022 10:38   CT Cervical Spine Wo Contrast  Result Date: 05/03/2022 CLINICAL DATA:  Head trauma, moderate-severe; Neck trauma (Age >= 65y). Multiple recent falls. Currently undergoing treatment for oropharyngeal cancer. EXAM: CT HEAD WITHOUT CONTRAST CT CERVICAL SPINE WITHOUT CONTRAST TECHNIQUE: Multidetector CT imaging of the head and cervical spine was performed following the standard protocol without intravenous contrast. Multiplanar CT image reconstructions of the cervical spine were also generated. RADIATION DOSE REDUCTION: This exam was performed according to the departmental dose-optimization program which includes automated exposure control, adjustment of the mA and/or kV according to patient size and/or use of iterative reconstruction technique. COMPARISON:  Neck CT 02/02/2022 FINDINGS: CT HEAD FINDINGS Brain: There is no evidence of an acute infarct, intracranial hemorrhage, mass, midline shift, or extra-axial fluid collection. The ventricles and sulci are within normal limits for age. Vascular: Calcified atherosclerosis at the skull base. No hyperdense vessel. Skull: No acute fracture or suspicious osseous lesion. Sinuses/Orbits: Mild right ethmoid air cell mucosal thickening. Clear mastoid air cells. Unremarkable orbits. Other: None. CT CERVICAL SPINE FINDINGS Alignment: Normal. Skull base and vertebrae: No acute fracture or suspicious osseous lesion. Soft tissues and spinal  canal: No prevertebral fluid or swelling. No visible canal hematoma. Disc levels: Moderate cervical spondylosis and mild-to-moderate facet arthrosis. Multilevel neural foraminal stenosis due to uncovertebral spurring, moderate to severe on the right greater than left at C5-6, left greater than right at C6-7, and on the right at C7-T1. Upper chest: Clear lung apices. Other: Post treatment changes in the neck with mucosal edema. Decreased size of the left level II nodal mass and decreased  size or possible resolution of the left-sided oropharyngeal mass, not primarily evaluated on this study. IMPRESSION: No evidence of acute intracranial abnormality or cervical spine fracture. Electronically Signed   By: Logan Bores M.D.   On: 05/03/2022 10:38   DG Chest 2 View  Result Date: 05/03/2022 CLINICAL DATA:  Cough, weakness EXAM: CHEST - 2 VIEW COMPARISON:  11/08/2018 FINDINGS: Right IJ approach chest port with distal tip terminating the level of the distal SVC. Left-sided implanted cardiac device remains in place. Cardiomegaly. No focal airspace consolidation, pleural effusion, or pneumothorax. IMPRESSION: No active cardiopulmonary disease. Electronically Signed   By: Davina Poke D.O.   On: 05/03/2022 10:25     Medical Consultants:   None.   Subjective:    Clarence Andrade relates he feels much better.  Objective:    Vitals:   05/05/22 0334 05/05/22 0400 05/05/22 0506 05/05/22 0600  BP:  120/65 (!) 111/54 115/69  Pulse:  95 85 79  Resp:  (!) '21 14 13  '$ Temp: 97.8 F (36.6 C)     TempSrc: Oral     SpO2:  100% 98% 100%  Weight:      Height:       SpO2: 100 % O2 Flow Rate (L/min): 3 L/min   Intake/Output Summary (Last 24 hours) at 05/05/2022 0733 Last data filed at 05/05/2022 0326 Gross per 24 hour  Intake 581.91 ml  Output 1175 ml  Net -593.09 ml   Filed Weights   05/03/22 0949 05/04/22 1841  Weight: 121.6 kg 120.7 kg    Exam: General exam: In no acute distress. Respiratory system: Good air movement and clear to auscultation. Cardiovascular system: S1 & S2 heard, RRR. No JVD. Gastrointestinal system: Abdomen is nondistended, soft and nontender.  Extremities: No pedal edema. Skin: No rashes, lesions or ulcers Psychiatry: Judgement and insight appear normal. Mood & affect appropriate.   Data Reviewed:    Labs: Basic Metabolic Panel: Recent Labs  Lab 05/03/22 1040 05/03/22 1452 05/03/22 2249 05/04/22 0200 05/04/22 0548 05/04/22 1012  05/04/22 1849 05/04/22 2159 05/05/22 0209 05/05/22 0430 05/05/22 0600  NA 114* 115*   < > 117* 117*   < > 117* 119* 123* 123* 123*  K 4.9  --   --   --  4.3  --   --   --   --  4.3  --   CL 76*  --   --   --  81*  --   --   --   --  87*  --   CO2 28  --   --   --  27  --   --   --   --  28  --   GLUCOSE 108*  --   --   --  110*  --   --   --   --  106*  --   BUN 23  --   --   --  21  --   --   --   --  11  --  CREATININE 0.69  --   --  0.70 0.71  --   --   --   --  0.48*  --   CALCIUM 8.7*  --   --   --  8.4*  --   --   --   --  8.3*  --   MG  --  1.2*  --   --  1.4*  --   --   --   --   --   --   PHOS  --  3.2  --   --   --   --   --   --   --   --   --    < > = values in this interval not displayed.    GFR Estimated Creatinine Clearance: 105.3 mL/min (A) (by C-G formula based on SCr of 0.48 mg/dL (L)). Liver Function Tests: Recent Labs  Lab 05/03/22 1040  AST 62*  ALT 39  ALKPHOS 57  BILITOT 0.8  PROT 6.3*  ALBUMIN 3.1*    Recent Labs  Lab 05/03/22 1040  LIPASE 31    No results for input(s): "AMMONIA" in the last 168 hours. Coagulation profile No results for input(s): "INR", "PROTIME" in the last 168 hours. COVID-19 Labs  No results for input(s): "DDIMER", "FERRITIN", "LDH", "CRP" in the last 72 hours.  Lab Results  Component Value Date   SARSCOV2NAA NEGATIVE 05/03/2022   [place] NEGATIVE 10/08/2019   [place] NEGATIVE 11/05/2018    CBC: Recent Labs  Lab 05/03/22 1040 05/04/22 0200 05/04/22 0548  WBC 5.2 5.8 4.2  NEUTROABS 3.4  --  2.7  HGB 10.1* 9.3* 9.0*  HCT 27.3* 25.3* 24.5*  MCV 88.6 89.1 89.7  PLT 188 167 175    Cardiac Enzymes: No results for input(s): "CKTOTAL", "CKMB", "CKMBINDEX", "TROPONINI" in the last 168 hours. BNP (last 3 results) No results for input(s): "PROBNP" in the last 8760 hours. CBG: No results for input(s): "GLUCAP" in the last 168 hours. D-Dimer: No results for input(s): "DDIMER" in the last 72 hours. Hgb  A1c: No results for input(s): "HGBA1C" in the last 72 hours. Lipid Profile: No results for input(s): "CHOL", "HDL", "LDLCALC", "TRIG", "CHOLHDL", "LDLDIRECT" in the last 72 hours. Thyroid function studies: Recent Labs    05/04/22 0545  TSH 0.315*   Anemia work up: No results for input(s): "VITAMINB12", "FOLATE", "FERRITIN", "TIBC", "IRON", "RETICCTPCT" in the last 72 hours. Sepsis Labs: Recent Labs  Lab 05/03/22 1040 05/04/22 0200 05/04/22 0548  WBC 5.2 5.8 4.2  LATICACIDVEN 1.1  --   --     Microbiology Recent Results (from the past 240 hour(s))  Resp panel by RT-PCR (RSV, Flu A&B, Covid) Peripheral     Status: None   Collection Time: 05/03/22  9:50 AM   Specimen: Peripheral; Nasal Swab  Result Value Ref Range Status   SARS Coronavirus 2 by RT PCR NEGATIVE NEGATIVE Final    Comment: (NOTE) SARS-CoV-2 target nucleic acids are NOT DETECTED.  The SARS-CoV-2 RNA is generally detectable in upper respiratory specimens during the acute phase of infection. The lowest concentration of SARS-CoV-2 viral copies this assay can detect is 138 copies/mL. A negative result does not preclude SARS-Cov-2 infection and should not be used as the sole basis for treatment or other patient management decisions. A negative result may occur with  improper specimen collection/handling, submission of specimen other than nasopharyngeal swab, presence of viral mutation(s) within the areas targeted by this assay, and inadequate number of viral  copies(<138 copies/mL). A negative result must be combined with clinical observations, patient history, and epidemiological information. The expected result is Negative.  Fact Sheet for Patients:  EntrepreneurPulse.com.au  Fact Sheet for Healthcare Providers:  IncredibleEmployment.be  This test is no t yet approved or cleared by the [place] FDA and  has been authorized for detection and/or diagnosis of SARS-CoV-2  by FDA under an Emergency Use Authorization (EUA). This EUA will remain  in effect (meaning this test can be used) for the duration of the COVID-19 declaration under Section 564(b)(1) of the Act, 21 U.S.C.section 360bbb-3(b)(1), unless the authorization is terminated  or revoked sooner.       Influenza A by PCR NEGATIVE NEGATIVE Final   Influenza B by PCR NEGATIVE NEGATIVE Final    Comment: (NOTE) The Xpert Xpress SARS-CoV-2/FLU/RSV plus assay is intended as an aid in the diagnosis of influenza from Nasopharyngeal swab specimens and should not be used as a sole basis for treatment. Nasal washings and aspirates are unacceptable for Xpert Xpress SARS-CoV-2/FLU/RSV testing.  Fact Sheet for Patients: EntrepreneurPulse.com.au  Fact Sheet for Healthcare Providers: IncredibleEmployment.be  This test is not yet approved or cleared by the [place] FDA and has been authorized for detection and/or diagnosis of SARS-CoV-2 by FDA under an Emergency Use Authorization (EUA). This EUA will remain in effect (meaning this test can be used) for the duration of the COVID-19 declaration under Section 564(b)(1) of the Act, 21 U.S.C. section 360bbb-3(b)(1), unless the authorization is terminated or revoked.     Resp Syncytial Virus by PCR NEGATIVE NEGATIVE Final    Comment: (NOTE) Fact Sheet for Patients: EntrepreneurPulse.com.au  Fact Sheet for Healthcare Providers: IncredibleEmployment.be  This test is not yet approved or cleared by the [place] FDA and has been authorized for detection and/or diagnosis of SARS-CoV-2 by FDA under an Emergency Use Authorization (EUA). This EUA will remain in effect (meaning this test can be used) for the duration of the COVID-19 declaration under Section 564(b)(1) of the Act, 21 U.S.C. section 360bbb-3(b)(1), unless the authorization is terminated or revoked.  Performed at  [facility], [place] [address] [place], [place] [address]   Blood culture (routine x 2)     Status: None (Preliminary result)   Collection Time: 05/03/22 10:40 AM   Specimen: BLOOD  Result Value Ref Range Status   Specimen Description   Final    BLOOD SITE NOT SPECIFIED Performed at [facility] [address], [place], [place] [address]    Special Requests   Final    BOTTLES DRAWN AEROBIC AND ANAEROBIC Blood Culture adequate volume Performed at [facility] [address], [place], [place] [address]    Culture   Final    NO GROWTH 2 DAYS Performed at [facility] [address] [place], [place] [address]    Report Status PENDING  Incomplete  Blood culture (routine x 2)     Status: None (Preliminary result)   Collection Time: 05/03/22 10:46 AM   Specimen: BLOOD LEFT ARM  Result Value Ref Range Status   Specimen Description   Final    BLOOD LEFT ARM Performed at [facility] [address], [place], [place] [address]    Special Requests   Final    BOTTLES DRAWN AEROBIC AND ANAEROBIC Blood Culture adequate volume Performed at [facility] [address] [place], [place] [address]    Culture   Final  NO GROWTH 2 DAYS Performed at [facility], [place] [address] [place], [place] [address]    Report Status PENDING  Incomplete  Urine Culture     Status: Abnormal (Preliminary result)   Collection Time: 05/03/22 12:37 PM   Specimen: Urine, Clean Catch  Result Value Ref Range Status   Specimen Description   Final    URINE, CLEAN CATCH Performed at [facility], [place] [address], [place], [place] [address]    Special Requests   Final    NONE Performed at [facility], [place] [address] [place], [place] [address]    Culture >=100,000 COLONIES/mL KLEBSIELLA PNEUMONIAE (A)  Final   Report Status PENDING  Incomplete     Medications:     aspirin EC  81 mg Oral Daily   Chlorhexidine Gluconate Cloth  6 each Topical Daily   enoxaparin (LOVENOX) injection  40 mg Subcutaneous Q24H   feeding supplement  237 mL Per Tube TID BM   free water  200 mL Per Tube Q6H   melatonin  3 mg Oral QHS   multivitamin with minerals  1 tablet Oral Daily   mouth rinse  15 mL Mouth Rinse 4 times per day   rosuvastatin  10 mg Oral Daily   tamsulosin  0.4 mg Oral Daily   Continuous Infusions:  cefTRIAXone (ROCEPHIN)  IV Stopped (05/04/22 1450)   sodium chloride (hypertonic) 45 mL/hr at 05/05/22 0341      LOS: 2 days   Charlynne Cousins  [facility]  05/05/2022, 7:33 AM

## 2022-05-05 NOTE — Progress Notes (Signed)
[facility] Progress Note  Subjective: seen in room.  Na+ up to 123 then 125 on 3% saline.   Vitals:   05/05/22 0900 05/05/22 1100 05/05/22 1200 05/05/22 1300  BP: (!) 130/49 125/61  125/61  Pulse: 88 89  94  Resp: '16 17  17  '$ Temp:   (!) 96.9 F (36.1 C)   TempSrc:   Axillary   SpO2: 99% 100%  100%  Weight:      Height:        Exam: Gen alert, no distress, elderly pleasant male Chest clear bilat to bases RRR no MRG Abd soft ntnd no mass or ascites +bs Ext 1+ bilat pretib edema Neuro is alert, Ox 3 , nf    Home meds include - asa 81, MVI, sl ntg, crestor, restoril, toprol xl 12.5 qd, prosource qd, flomax, prns/ vits/ supps      BP's normal 110- 125/ 63- 86    Orthostatics >> 103/92        HR 90   lying                              113/63        HR 102 sitting                              107/58        HR 107 standing 0 min                              109/54        HR 106 standing 3 min      RA 97%, HR 90s- 100s, RR 15-21  afeb     Na 114  K 4.9  CO2 28  BUn 23  Creat 0.69      Ca 8.7  alb 3.1  AST 62/ aLT 39  Tbili 0.8   BNP 181       Trop 21, 18    WBC 5K  Hb 10    UA many bact, >50 wbc, 0-5 rbc/ epis    UNa 14, UCr 125 , UOsm 716     CXR - no active disease       Assessment/ Plan: Hyponatremia - in patient w/ head & neck cancer, recently completed 1st round chemo and radiation and was having lightheadedness and falls at home. Has a PEG tube. Has some LE edema on presentation but clear CXR and clear lungs on exam. UNa and FeNa were low, and the Na+ levels improved from 113 to 117 with 0.9% saline, but then stalled at 117. This suggests some vol overload but is not the whole cause. Suspect there may be SIADH component. Would suggest oncology be involved. Cont 3% saline for now, will dc when Na+ is > 127-128.  Head & neck cancer -stage I/ II. Pt has PEG tube, finished chemo / XRT recently [facility] - sp PPM Morbid obesity OSA Hx of pHTN            Rob  Judith Campillo 05/05/2022, 1:51 PM   Recent Labs  Lab 05/03/22 1040 05/03/22 1452 05/04/22 0200 05/04/22 0548 05/05/22 0430  HGB 10.1*  --  9.3* 9.0*  --   ALBUMIN 3.1*  --   --   --   --   CALCIUM 8.7*  --   --  8.4* 8.3*  PHOS  --  3.2  --   --   --   CREATININE 0.69  --  0.70 0.71 0.48*  K 4.9  --   --  4.3 4.3    No results for input(s): "IRON", "TIBC", "FERRITIN" in the last 168 hours. Inpatient medications:  aspirin EC  81 mg Oral Daily   cephALEXin  500 mg Oral Q8H   Chlorhexidine Gluconate Cloth  6 each Topical Daily   enoxaparin (LOVENOX) injection  40 mg Subcutaneous Q24H   feeding supplement (OSMOLITE 1.5 CAL)  474 mL Per Tube TID   [START ON 05/06/2022] feeding supplement (PROSource TF20)  60 mL Per Tube BID   free water  100 mL Per Tube Q8H   melatonin  3 mg Oral QHS   multivitamin with minerals  1 tablet Oral Daily   mouth rinse  15 mL Mouth Rinse 4 times per day   rosuvastatin  10 mg Oral Daily   tamsulosin  0.4 mg Oral Daily    sodium chloride (hypertonic) 45 mL/hr at 05/05/22 0810   nitroGLYCERIN, mouth rinse, QUEtiapine, temazepam

## 2022-05-05 NOTE — Progress Notes (Signed)
Initial Nutrition Assessment  DOCUMENTATION CODES:   Non-severe (moderate) malnutrition in context of chronic illness  INTERVENTION:  - TF regimen as below. Discussed with MD and RN. Tube feeding via PEG: 2 cartons of Osmolite 1.5, 3 times per day  = 6 cartons/day Prosource TF20 60 ml BID Provides 2320 kcal, 130 gm protein, 1097 ml free water daily *Per MD, free water flushes 132m Q8H   - Monitor electrolytes.  - Continue multivitamin as medically appropriate. - Monitor weight trends.    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  GOAL:   Patient will meet greater than or equal to 90% of their needs  MONITOR:   Labs, TF tolerance, Weight trends  REASON FOR ASSESSMENT:   Consult Enteral/tube feeding initiation and management, Assessment of nutrition requirement/status  ASSESSMENT:   76y.o. male with h/o HTN, HLD, BPH,  squamous cell carcinoma of the oropharynx, finished concurrent chemo/XRT in December 2023 who presented with c/o progressive weakness, having hallucination and falls since prescribed scopolamine patch recently.  Met with patient and wife at bedside this AM, both assisted in providing nutrition history. UBW reported to be 288# that pt last weighed at the end of August. Wife and patient report weight loss since that time. Per office weights, patient weighed at 290# at the beginning of September and dropped to 259# by the middle of December - a 31# or 10.7% weight loss in 3.5 months, significant. Patient is noted to have been weighed at 266# this admission but he does not recall taking a standing weight so suspect weight is bed scale, question accuracy.  Weight history: 8/9: 290# 9/7: 290# 10/12: 281# 11/7: 276# 11/28: 262# 12/12: 259# 12/27: 266# (during this admission, likely bed  weight)  Patient reports he has previously been consuming oral nutrition supplements but that he has not had anything to eat by mouth since December 1st due to throat and mouth pain after radiation.  Patient has a G tube and has been using this for sole nutrition since being unable to take anything by mouth. Patient follows with outpatient cancer RD who has been working with patient on TF regimen at home Pt notes that the TF regimen changes depending on what formula/supplements are available.  Regimen 1 week PTA as below: Formula: Osmolite 1.5 OR Ensure Complete  Bolus: 1 carton of either formula + 8oz of whole milk 3 times daily = 3 feedings per day Free water: 8oz three times daily for a total of 36oz/day Provides: ~1500 kcals per day and 45-90g protein, 10640mfrom free water flushes   Patient reports he is able to take 16 oz of tube feeding at once. Likes having the boluses three times daily. He is agreeable to try 2 carton of Osmolite 1.5 3 times daily for a total of 6 cartons a day in addition to prosource for extra protein.  Wife and patient wanted to confirm regimen would be okay with MD. Discussed tube feeding plan with MD and MD agreeable with plan. Discussed tube feed regimen with RN as well.   Patient is thankful to note his throat is feeling better since completing radiation and he is feeling up to drinking some water. He is hopeful he will be able to start taking PO by mouth again.    Medications reviewed and include: MVI  Labs reviewed:  Na 123  NUTRITION - FOCUSED PHYSICAL EXAM:  Flowsheet Row Most Recent Value  Orbital Region Mild depletion  Upper Arm Region No depletion  Thoracic and Lumbar Region No depletion  Buccal Region Mild depletion  Temple Region Mild depletion  Clavicle Bone Region No depletion  Clavicle and Acromion Bone Region No depletion  Scapular Bone Region Unable to assess  Dorsal Hand No depletion  Patellar Region No depletion  Anterior  Thigh Region No depletion  Posterior Calf Region No depletion  Edema (RD Assessment) Mild  Hair Reviewed  Eyes Reviewed  Mouth Reviewed  Skin Reviewed  Nails Reviewed       Diet Order:   Diet Order     None       EDUCATION NEEDS:  Education needs have been addressed  Skin:  Skin Assessment: Reviewed RN Assessment  Last BM:  PTA  Height:  Ht Readings from Last 1 Encounters:  05/04/22 6' (1.829 m)   Weight:  Wt Readings from Last 1 Encounters:  05/04/22 120.7 kg   Ideal Body Weight:  80.9 kg  BMI:  Body mass index is 36.09 kg/m.  Estimated Nutritional Needs:  Kcal:  9507-2257 kcals Protein:  120-145 grams Fluid:  >=/ 2.3L or per MD    Samson Frederic RD, LDN For contact information, refer to [facility].

## 2022-05-06 ENCOUNTER — Encounter (HOSPITAL_COMMUNITY): Payer: Self-pay | Admitting: Internal Medicine

## 2022-05-06 ENCOUNTER — Other Ambulatory Visit: Payer: Self-pay

## 2022-05-06 DIAGNOSIS — E876 Hypokalemia: Secondary | ICD-10-CM | POA: Diagnosis not present

## 2022-05-06 DIAGNOSIS — C109 Malignant neoplasm of oropharynx, unspecified: Secondary | ICD-10-CM

## 2022-05-06 DIAGNOSIS — C01 Malignant neoplasm of base of tongue: Secondary | ICD-10-CM

## 2022-05-06 LAB — SODIUM
Sodium: 123 mmol/L — ABNORMAL LOW (ref 135–145)
Sodium: 128 mmol/L — ABNORMAL LOW (ref 135–145)
Sodium: 128 mmol/L — ABNORMAL LOW (ref 135–145)
Sodium: 128 mmol/L — ABNORMAL LOW (ref 135–145)
Sodium: 129 mmol/L — ABNORMAL LOW (ref 135–145)

## 2022-05-06 LAB — GLUCOSE, CAPILLARY
Glucose-Capillary: 104 mg/dL — ABNORMAL HIGH (ref 70–99)
Glucose-Capillary: 104 mg/dL — ABNORMAL HIGH (ref 70–99)
Glucose-Capillary: 109 mg/dL — ABNORMAL HIGH (ref 70–99)
Glucose-Capillary: 114 mg/dL — ABNORMAL HIGH (ref 70–99)
Glucose-Capillary: 142 mg/dL — ABNORMAL HIGH (ref 70–99)
Glucose-Capillary: 150 mg/dL — ABNORMAL HIGH (ref 70–99)
Glucose-Capillary: 97 mg/dL (ref 70–99)

## 2022-05-06 LAB — PHOSPHORUS
Phosphorus: 3.7 mg/dL (ref 2.5–4.6)
Phosphorus: 3 mg/dL (ref 2.5–4.6)

## 2022-05-06 LAB — BASIC METABOLIC PANEL
Anion gap: 5 (ref 5–15)
BUN: 11 mg/dL (ref 8–23)
CO2: 31 mmol/L (ref 22–32)
Calcium: 8.4 mg/dL — ABNORMAL LOW (ref 8.9–10.3)
Chloride: 91 mmol/L — ABNORMAL LOW (ref 98–111)
Creatinine, Ser: 0.54 mg/dL — ABNORMAL LOW (ref 0.61–1.24)
GFR, Estimated: >60 mL/min (ref >60)
Glucose, Bld: 119 mg/dL — ABNORMAL HIGH (ref 70–99)
Potassium: 4.3 mmol/L (ref 3.5–5.1)
Sodium: 127 mmol/L — ABNORMAL LOW (ref 135–145)

## 2022-05-06 LAB — OSMOLALITY, URINE: Osmolality, Ur: 294 mOsm/kg — ABNORMAL LOW (ref 300–900)

## 2022-05-06 LAB — MAGNESIUM
Magnesium: 1.6 mg/dL — ABNORMAL LOW (ref 1.7–2.4)
Magnesium: 1.9 mg/dL (ref 1.7–2.4)

## 2022-05-06 MED ORDER — MAGNESIUM OXIDE -MG SUPPLEMENT 400 (240 MG) MG PO TABS
400.0000 mg | ORAL_TABLET | Freq: Two times a day (BID) | ORAL | Status: AC
  Administered 2022-05-06 (×2): 400 mg via ORAL
  Filled 2022-05-06 (×2): qty 1

## 2022-05-06 MED ORDER — MAGNESIUM SULFATE 2 GM/50ML IV SOLN
2.0000 g | Freq: Once | INTRAVENOUS | Status: AC
  Administered 2022-05-06: 2 g via INTRAVENOUS
  Filled 2022-05-06: qty 50

## 2022-05-06 MED ORDER — SODIUM CHLORIDE 1 G PO TABS
2.0000 g | ORAL_TABLET | Freq: Two times a day (BID) | ORAL | Status: DC
  Administered 2022-05-06 – 2022-05-07 (×3): 2 g via ORAL
  Filled 2022-05-06 (×3): qty 2

## 2022-05-06 NOTE — Progress Notes (Addendum)
[facility] PROGRESS NOTE    Progress Note  Clarence Andrade  NKN:[id number] DOB: December 06, 1945 DOA: 05/03/2022 PCP: Kathalene Frames, MD     Brief Narrative:   Clarence Andrade is an 76 y.o. male past medical history of essential hypertension, squamous cell carcinoma of the oropharynx finished chemotherapy and radiation therapy this month by Dr. Chryl Heck, with a PEG tube in place comes in with progressive weakness and hallucinations along with falls since she was recently prescribed scopolamine patch.  She has had orthostatic symptoms for the last several days along with a cough.  With hyponatremia nephrology was consulted    Assessment/Plan:   Hyponatremia frequent falls and unsteady gait: Nephrology was consulted who recommended urinary osmolarity, sodium and creatinine. IV fluids were KVO. He was started on 3% saline his sodium this morning is 127. Appreciate nephrology's assistance. Has remained afebrile urine cultures and blood cultures remain negative to date. Imaging showed no fracture or dislocation. Oncology was consulted who relates there is a component of SIADH probably related to cisplatin. The patient is third spacing.  Hypomagnesemia: Repeat lower recheck in the morning, magnesium is trending up.  Anasarca bilateral lower extremity edema: Lower extremity Doppler negative 2D echo EF of 37% mild diastolic dysfunction.  Right ventricular function is normal. Will discuss with renal when to start diuresis.  He does not wear oxygen at home.  Essential hypertension: He is off Lopressor lisinopril and Imdur. His blood pressure slowly trending up. His blood pressure stable on current agent.  CAD: Continue to hold Imdur continue aspirin and statins.  Recently treated for UTI: Urine culture is growing Klebsiella will transition to oral Keflex, will complete a 7-day course.  Restart Flomax.  BPH: Flomax was held about 2 months ago due to hypotension, restarted on  this admission.  Squamous cell carcinoma of the oropharynx: Need to follow-up with oncology as an outpatient.  Failure to thrive/weakness/falls: Consult PT OT might need skilled upon discharge.  Moderate protein caloric malnutrition/Nutrition: Consult nutrition for assessment and tube feedings.   DVT prophylaxis: lovenox Family Communication:e Status is: Inpatient Remains inpatient appropriate because: Hyponatremia    Code Status:     Code Status Orders  (From admission, onward)           Start     Ordered   05/03/22 1804  Full code  Continuous       Question:  By:  Answer:  Consent: discussion documented in EHR   05/03/22 1803           Code Status History     Date Active Date Inactive Code Status Order ID Comments User Context   10/11/2019 1050 10/12/2019 0002 Full Code [id number]  Armandina Gemma, MD Inpatient   11/08/2018 1441 11/08/2018 2157 Full Code [id number]  Constance Haw, MD Inpatient   03/23/2018 0855 03/23/2018 1454 Full Code [id number]  Nelva Bush, MD Inpatient         IV Access:   Peripheral IV   Procedures and diagnostic studies:   DG CHEST PORT 1 VIEW  Result Date: 05/05/2022 CLINICAL DATA:  Shortness of breath EXAM: PORTABLE CHEST 1 VIEW COMPARISON:  05/03/2022 FINDINGS: Left pacer and right Port-A-Cath remain in place, unchanged. Heart and mediastinal contours are within normal limits. No focal opacities or effusions. No acute bony abnormality. IMPRESSION: No active cardiopulmonary disease. Electronically Signed   By: Rolm Baptise M.D.   On: 05/05/2022 15:22   ECHOCARDIOGRAM COMPLETE  Result Date: 05/04/2022    ECHOCARDIOGRAM  REPORT   Patient Name:   Clarence Andrade Date of Exam: 05/04/2022 Medical Rec #:  [id number]       Height:       72.0 in Accession #:    [id number]      Weight:       268.0 lb Date of Birth:  [birth year]       BSA:          2.413 m Patient Age:    3 years        BP:           131/96 mmHg Patient Gender: M                HR:           99 bpm. Exam Location:  Inpatient Procedure: 2D Echo and Intracardiac Opacification Agent Indications:    CHF  History:        Patient has prior history of Echocardiogram examinations, most                 recent 03/10/2020. Signs/Symptoms:Murmur.  Sonographer:    Harvie Junior Referring Phys: [id number] FANG XU  Sonographer Comments: Technically difficult study due to poor echo windows and patient is obese. Image acquisition challenging due to patient body habitus and Image acquisition challenging due to respiratory motion. IMPRESSIONS  1. Limited study due to poor echo windows.  2. Left ventricular ejection fraction, by estimation, is 55 to 60%. The left ventricle has normal function. The left ventricle has no regional wall motion abnormalities. There is mild concentric left ventricular hypertrophy. Left ventricular diastolic parameters are indeterminate.  3. Right ventricular systolic function is normal. The right ventricular size is not well visualized. There is normal pulmonary artery systolic pressure.  4. The mitral valve is grossly normal. Trivial mitral valve regurgitation. No evidence of mitral stenosis.  5. There is at least mild aortic stenosis and mean gradient may be underestimated due to suboptimal doppler angle of interrogation for both LVOT VTI and AoV VTI. The aortic valve is calcified. Aortic valve regurgitation is not visualized. There is at least mild aortic stenosis. Aortic valve mean gradient measures 15.7 mmHg. Aortic valve Vmax measures 2.53 m/s.  6. Aortic dilatation noted. There is borderline dilatation of the aortic root, measuring 40 mm. Comparison(s): Compared to prior TTE in 2021, there continues to be at least mild AS (prior mean gradient 75mHg). Otherwise, there is no significant change. FINDINGS  Left Ventricle: Left ventricular ejection fraction, by estimation, is 55 to 60%. The left ventricle has normal function. The left ventricle has no regional wall motion  abnormalities. The left ventricular internal cavity size was normal in size. There is  mild concentric left ventricular hypertrophy. Left ventricular diastolic parameters are indeterminate. Right Ventricle: The right ventricular size is not well visualized. Right vetricular wall thickness was not well visualized. Right ventricular systolic function is normal. There is normal pulmonary artery systolic pressure. The tricuspid regurgitant velocity is 2.77 m/s, and with an assumed right atrial pressure of 3 mmHg, the estimated right ventricular systolic pressure is 373.5mmHg. Left Atrium: Left atrial size was not well visualized. Right Atrium: Right atrial size was not well visualized. Pericardium: There is no evidence of pericardial effusion. Mitral Valve: The mitral valve is grossly normal. There is mild thickening of the mitral valve leaflet(s). There is mild calcification of the mitral valve leaflet(s). Trivial mitral valve regurgitation. No evidence of mitral valve stenosis. Tricuspid Valve: The tricuspid valve is  grossly normal. Tricuspid valve regurgitation is trivial. Aortic Valve: There is at least mild aortic stenosis and mean gradient may be underestimated due to suboptimal doppler angle of interrogation for both LVOT VTI and AoV VTI. The aortic valve is calcified. Aortic valve regurgitation is not visualized. There is at least mild aortic stenosis. Aortic valve mean gradient measures 15.7 mmHg. Aortic valve peak gradient measures 25.6 mmHg. Aortic valve area, by VTI measures 1.74 cm. Pulmonic Valve: The pulmonic valve was not well visualized. Aorta: Aortic dilatation noted. There is borderline dilatation of the aortic root, measuring 40 mm. IAS/Shunts: The atrial septum is grossly normal.  LEFT VENTRICLE PLAX 2D LVIDd:         4.20 cm      Diastology LVIDs:         2.60 cm      LV e' medial:    9.90 cm/s LV PW:         1.30 cm      LV E/e' medial:  8.7 LV IVS:        1.30 cm      LV e' lateral:   11.40 cm/s  LVOT diam:     2.30 cm      LV E/e' lateral: 7.6 LV SV:         66 LV SV Index:   28 LVOT Area:     4.15 cm  LV Volumes (MOD) LV vol d, MOD A4C: 104.0 ml LV vol s, MOD A4C: 50.1 ml LV SV MOD A4C:     104.0 ml RIGHT VENTRICLE RV S prime:     15.10 cm/s TAPSE (M-mode): 2.1 cm LEFT ATRIUM             Index        RIGHT ATRIUM           Index LA Vol (A2C):   57.2 ml 23.71 ml/m  RA Area:     17.40 cm LA Vol (A4C):   62.0 ml 25.70 ml/m  RA Volume:   47.50 ml  19.69 ml/m LA Biplane Vol: 60.4 ml 25.03 ml/m  AORTIC VALVE                     PULMONIC VALVE AV Area (Vmax):    1.44 cm      PV Vmax:       1.37 m/s AV Area (Vmean):   1.79 cm      PV Peak grad:  7.5 mmHg AV Area (VTI):     1.74 cm AV Vmax:           252.80 cm/s AV Vmean:          163.667 cm/s AV VTI:            0.382 m AV Peak Grad:      25.6 mmHg AV Mean Grad:      15.7 mmHg LVOT Vmax:         87.50 cm/s LVOT Vmean:        70.500 cm/s LVOT VTI:          0.160 m LVOT/AV VTI ratio: 0.42  AORTA Ao Root diam: 3.60 cm MITRAL VALVE               TRICUSPID VALVE MV Area (PHT): 6.17 cm    TR Peak grad:   30.7 mmHg MV Decel Time: 123 msec    TR Vmax:        277.00 cm/s MR  Peak grad: 76.2 mmHg MR Vmax:      436.33 cm/s  SHUNTS MV E velocity: 86.40 cm/s  Systemic VTI:  0.16 m MV A velocity: 50.30 cm/s  Systemic Diam: 2.30 cm MV E/A ratio:  1.72 Gwyndolyn Kaufman MD Electronically signed by Gwyndolyn Kaufman MD Signature Date/Time: 05/04/2022/4:45:28 PM    Final      Medical Consultants:   None.   Subjective:    [name] sleeping this morning  Objective:    Vitals:   05/06/22 0300 05/06/22 0400 05/06/22 0500 05/06/22 0615  BP:  (!) 145/64 (!) 145/64   Pulse: 94 (!) 103 (!) 101   Resp: 19 (!) 24 20   Temp:  97.8 F (36.6 C)  97.9 F (36.6 C)  TempSrc:  Oral  Oral  SpO2: 97% 100% 98%   Weight:   118.4 kg   Height:       SpO2: 98 % O2 Flow Rate (L/min): 3 L/min   Intake/Output Summary (Last 24 hours) at 05/06/2022 0720 Last  data filed at 05/06/2022 0246 Gross per 24 hour  Intake 1778.19 ml  Output 900 ml  Net 878.19 ml    Filed Weights   05/03/22 0949 05/04/22 1841 05/06/22 0500  Weight: 121.6 kg 120.7 kg 118.4 kg    Exam: General exam: In no acute distress. Respiratory system: Good air movement and clear to auscultation. Cardiovascular system: S1 & S2 heard, RRR. No JVD. Gastrointestinal system: Abdomen is nondistended, soft and nontender.  Extremities: 3+ edema Skin: No rashes, lesions or ulcers Psychiatry: Judgement and insight appear normal. Mood & affect appropriate.   Data Reviewed:    Labs: Basic Metabolic Panel: Recent Labs  Lab 05/03/22 1040 05/03/22 1452 05/03/22 2249 05/04/22 0200 05/04/22 0548 05/04/22 1012 05/05/22 0430 05/05/22 0600 05/05/22 1035 05/05/22 1545 05/05/22 1900 05/05/22 2348 05/06/22 0414  NA 114* 115*   < > 117* 117*   < > 123*   < > 125* 125* 127* 128* 127*  K 4.9  --   --   --  4.3  --  4.3  --   --   --   --   --  4.3  CL 76*  --   --   --  81*  --  87*  --   --   --   --   --  91*  CO2 28  --   --   --  27  --  28  --   --   --   --   --  31  GLUCOSE 108*  --   --   --  110*  --  106*  --   --   --   --   --  119*  BUN 23  --   --   --  21  --  11  --   --   --   --   --  11  CREATININE 0.69  --   --  0.70 0.71  --  0.48*  --   --   --   --   --  0.54*  CALCIUM 8.7*  --   --   --  8.4*  --  8.3*  --   --   --   --   --  8.4*  MG  --  1.2*  --   --  1.4*  --   --   --   --  1.6*  --   --  1.6*  PHOS  --  3.2  --   --   --   --   --   --   --  3.8  --   --  3.7   < > = values in this interval not displayed.    GFR Estimated Creatinine Clearance: 104.3 mL/min (A) (by C-G formula based on SCr of 0.54 mg/dL (L)). Liver Function Tests: Recent Labs  Lab 05/03/22 1040  AST 62*  ALT 39  ALKPHOS 57  BILITOT 0.8  PROT 6.3*  ALBUMIN 3.1*    Recent Labs  Lab 05/03/22 1040  LIPASE 31    No results for input(s): "AMMONIA" in the last 168  hours. Coagulation profile No results for input(s): "INR", "PROTIME" in the last 168 hours. COVID-19 Labs  No results for input(s): "DDIMER", "FERRITIN", "LDH", "CRP" in the last 72 hours.  Lab Results  Component Value Date   SARSCOV2NAA NEGATIVE 05/03/2022   [place] NEGATIVE 10/08/2019   [place] NEGATIVE 11/05/2018    CBC: Recent Labs  Lab 05/03/22 1040 05/04/22 0200 05/04/22 0548  WBC 5.2 5.8 4.2  NEUTROABS 3.4  --  2.7  HGB 10.1* 9.3* 9.0*  HCT 27.3* 25.3* 24.5*  MCV 88.6 89.1 89.7  PLT 188 167 175    Cardiac Enzymes: No results for input(s): "CKTOTAL", "CKMB", "CKMBINDEX", "TROPONINI" in the last 168 hours. BNP (last 3 results) No results for input(s): "PROBNP" in the last 8760 hours. CBG: Recent Labs  Lab 05/05/22 1640 05/05/22 2156 05/05/22 2353 05/06/22 0407  GLUCAP 116* 112* 97 114*   D-Dimer: No results for input(s): "DDIMER" in the last 72 hours. Hgb A1c: No results for input(s): "HGBA1C" in the last 72 hours. Lipid Profile: No results for input(s): "CHOL", "HDL", "LDLCALC", "TRIG", "CHOLHDL", "LDLDIRECT" in the last 72 hours. Thyroid function studies: Recent Labs    05/04/22 0545  TSH 0.315*    Anemia work up: No results for input(s): "VITAMINB12", "FOLATE", "FERRITIN", "TIBC", "IRON", "RETICCTPCT" in the last 72 hours. Sepsis Labs: Recent Labs  Lab 05/03/22 1040 05/04/22 0200 05/04/22 0548  WBC 5.2 5.8 4.2  LATICACIDVEN 1.1  --   --     Microbiology Recent Results (from the past 240 hour(s))  Resp panel by RT-PCR (RSV, Flu A&B, Covid) Peripheral     Status: None   Collection Time: 05/03/22  9:50 AM   Specimen: Peripheral; Nasal Swab  Result Value Ref Range Status   SARS Coronavirus 2 by RT PCR NEGATIVE NEGATIVE Final    Comment: (NOTE) SARS-CoV-2 target nucleic acids are NOT DETECTED.  The SARS-CoV-2 RNA is generally detectable in upper respiratory specimens during the acute phase of infection. The lowest concentration  of SARS-CoV-2 viral copies this assay can detect is 138 copies/mL. A negative result does not preclude SARS-Cov-2 infection and should not be used as the sole basis for treatment or other patient management decisions. A negative result may occur with  improper specimen collection/handling, submission of specimen other than nasopharyngeal swab, presence of viral mutation(s) within the areas targeted by this assay, and inadequate number of viral copies(<138 copies/mL). A negative result must be combined with clinical observations, patient history, and epidemiological information. The expected result is Negative.  Fact Sheet for Patients:  EntrepreneurPulse.com.au  Fact Sheet for Healthcare Providers:  IncredibleEmployment.be  This test is no t yet approved or cleared by the [place] FDA and  has been authorized for detection and/or diagnosis of SARS-CoV-2 by FDA under an Emergency Use Authorization (EUA). This  EUA will remain  in effect (meaning this test can be used) for the duration of the COVID-19 declaration under Section 564(b)(1) of the Act, 21 U.S.C.section 360bbb-3(b)(1), unless the authorization is terminated  or revoked sooner.       Influenza A by PCR NEGATIVE NEGATIVE Final   Influenza B by PCR NEGATIVE NEGATIVE Final    Comment: (NOTE) The Xpert Xpress SARS-CoV-2/FLU/RSV plus assay is intended as an aid in the diagnosis of influenza from Nasopharyngeal swab specimens and should not be used as a sole basis for treatment. Nasal washings and aspirates are unacceptable for Xpert Xpress SARS-CoV-2/FLU/RSV testing.  Fact Sheet for Patients: EntrepreneurPulse.com.au  Fact Sheet for Healthcare Providers: IncredibleEmployment.be  This test is not yet approved or cleared by the [place] FDA and has been authorized for detection and/or diagnosis of SARS-CoV-2 by FDA under an Emergency Use  Authorization (EUA). This EUA will remain in effect (meaning this test can be used) for the duration of the COVID-19 declaration under Section 564(b)(1) of the Act, 21 U.S.C. section 360bbb-3(b)(1), unless the authorization is terminated or revoked.     Resp Syncytial Virus by PCR NEGATIVE NEGATIVE Final    Comment: (NOTE) Fact Sheet for Patients: EntrepreneurPulse.com.au  Fact Sheet for Healthcare Providers: IncredibleEmployment.be  This test is not yet approved or cleared by the [place] FDA and has been authorized for detection and/or diagnosis of SARS-CoV-2 by FDA under an Emergency Use Authorization (EUA). This EUA will remain in effect (meaning this test can be used) for the duration of the COVID-19 declaration under Section 564(b)(1) of the Act, 21 U.S.C. section 360bbb-3(b)(1), unless the authorization is terminated or revoked.  Performed at [facility], [place] [address], [place], [place] [address]   Blood culture (routine x 2)     Status: None (Preliminary result)   Collection Time: 05/03/22 10:40 AM   Specimen: BLOOD  Result Value Ref Range Status   Specimen Description   Final    BLOOD SITE NOT SPECIFIED Performed at [facility] [address] [place], [place] [address]    Special Requests   Final    BOTTLES DRAWN AEROBIC AND ANAEROBIC Blood Culture adequate volume Performed at [facility] [address], [place], [place] [address]    Culture   Final    NO GROWTH 2 DAYS Performed at [facility] [address] [place], [place] [address]    Report Status PENDING  Incomplete  Blood culture (routine x 2)     Status: None (Preliminary result)   Collection Time: 05/03/22 10:46 AM   Specimen: BLOOD LEFT ARM  Result Value Ref Range Status   Specimen Description   Final    BLOOD LEFT ARM Performed at [facility] [address]  [place], [place] [address]    Special Requests   Final    BOTTLES DRAWN AEROBIC AND ANAEROBIC Blood Culture adequate volume Performed at [facility] [address], [place], [place] [address]    Culture   Final    NO GROWTH 2 DAYS Performed at [facility] [address], [place], [place] [address]    Report Status PENDING  Incomplete  Urine Culture     Status: Abnormal   Collection Time: 05/03/22 12:37 PM   Specimen: Urine, Clean Catch  Result Value Ref Range Status   Specimen Description   Final    URINE, CLEAN CATCH Performed at [facility], [place] [address], [place], [place] [address]    Special Requests   Final    NONE Performed at [facility], [place] [address], [place], [place] [address]    Culture >=100,000 COLONIES/mL KLEBSIELLA PNEUMONIAE (A)  Final   Report Status 05/05/2022 FINAL  Final   Organism ID, Bacteria KLEBSIELLA PNEUMONIAE (A)  Final      Susceptibility   Klebsiella pneumoniae - MIC*    AMPICILLIN >=32 RESISTANT Resistant     CEFAZOLIN <=4 SENSITIVE Sensitive     CEFEPIME <=0.12 SENSITIVE Sensitive     CEFTRIAXONE <=0.25 SENSITIVE Sensitive     CIPROFLOXACIN <=0.25 SENSITIVE Sensitive     GENTAMICIN <=1 SENSITIVE Sensitive     IMIPENEM <=0.25 SENSITIVE Sensitive     NITROFURANTOIN 64 INTERMEDIATE Intermediate     TRIMETH/SULFA <=20 SENSITIVE Sensitive     AMPICILLIN/SULBACTAM 4 SENSITIVE Sensitive     PIP/TAZO <=4 SENSITIVE Sensitive     * >=100,000 COLONIES/mL KLEBSIELLA PNEUMONIAE     Medications:    aspirin EC  81 mg Oral Daily   cephALEXin  500 mg Oral Q8H   Chlorhexidine Gluconate Cloth  6 each Topical Daily   enoxaparin (LOVENOX) injection  40 mg Subcutaneous Q24H   feeding supplement (OSMOLITE 1.5 CAL)  474 mL Per Tube TID   feeding supplement (PROSource TF20)  60 mL Per Tube BID   free water  100 mL Per Tube Q8H   melatonin  3 mg Oral QHS   multivitamin with minerals  1 tablet  Oral Daily   mouth rinse  15 mL Mouth Rinse 4 times per day   rosuvastatin  10 mg Oral Daily   tamsulosin  0.4 mg Oral Daily   Continuous Infusions:  magnesium sulfate bolus IVPB 2 g (05/06/22 0635)      LOS: 3 days   Charlynne Cousins  [facility]  05/06/2022, 7:20 AM

## 2022-05-06 NOTE — Progress Notes (Signed)
Physical Therapy Treatment Patient Details Name: Clarence Andrade MRN: [id number] DOB: 06-Feb-1946 Today's Date: 05/06/2022   History of Present Illness Patient is 75 y.o. male presenting with progressive weakness and hallucinations along with falls since she was recently prescribed scopolamine patch.  He has had orthostatic symptoms for the last several days along with a cough. PMH significant for essential hypertension, squamous cell carcinoma of the oropharynx finished chemotherapy and radiation therapy this month by Dr. Chryl Heck, with a PEG tube in place.    PT Comments    The patient ambulated  x 200' with Rw, gait steady, SPO2 >93% on RA, resting 96%. HR 80'l   Continue progressive mobility.  Recommendations for follow up therapy are one component of a multi-disciplinary discharge planning process, led by the attending physician.  Recommendations may be updated based on patient status, additional functional criteria and insurance authorization.  Follow Up Recommendations  Home health PT     Assistance Recommended at Discharge Intermittent Supervision/Assistance  Patient can return home with the following A little help with walking and/or transfers;A little help with bathing/dressing/bathroom;Assistance with cooking/housework;Assist for transportation;Help with stairs or ramp for entrance   Equipment Recommendations  None recommended by PT    Recommendations for Other Services       Precautions / Restrictions Precautions Precautions: Fall Precaution Comments: PEG     Mobility  Bed Mobility Overal bed mobility: Modified Independent                  Transfers Overall transfer level: Needs assistance Equipment used: Rolling walker (2 wheels) Transfers: Sit to/from Stand Sit to Stand: Min guard                Ambulation/Gait Ambulation/Gait assistance: Min guard Gait Distance (Feet): 200 Feet Assistive device: Rolling walker (2 wheels) Gait  Pattern/deviations: Step-through pattern Gait velocity: decr     General Gait Details: ambulated on Ra , Spo2 > 92%   Stairs             Wheelchair Mobility    Modified Rankin (Stroke Patients Only)       Balance   Sitting-balance support: No upper extremity supported, Feet supported Sitting balance-Leahy Scale: Good     Standing balance support: Bilateral upper extremity supported, During functional activity, Reliant on assistive device for balance Standing balance-Leahy Scale: Fair                              Cognition Arousal/Alertness: Awake/alert Behavior During Therapy: WFL for tasks assessed/performed Overall Cognitive Status: Within Functional Limits for tasks assessed                                          Exercises      General Comments        Pertinent Vitals/Pain Pain Assessment Pain Assessment: Faces Faces Pain Scale: Hurts little more Pain Location: knees Pain Descriptors / Indicators: Discomfort Pain Intervention(s): Monitored during session    Home Living                          Prior Function            PT Goals (current goals can now be found in the care plan section) Progress towards PT goals: Progressing toward goals    Frequency  Min 3X/week      PT Plan Current plan remains appropriate    Co-evaluation              AM-PAC PT "6 Clicks" Mobility   Outcome Measure  Help needed turning from your back to your side while in a flat bed without using bedrails?: None Help needed moving from lying on your back to sitting on the side of a flat bed without using bedrails?: None Help needed moving to and from a bed to a chair (including a wheelchair)?: A Little Help needed standing up from a chair using your arms (e.g., wheelchair or bedside chair)?: A Little Help needed to walk in hospital room?: A Little Help needed climbing 3-5 steps with a railing? : A Lot 6 Click  Score: 19    End of Session Equipment Utilized During Treatment: Gait belt Activity Tolerance: Patient tolerated treatment well Patient left: in bed;with call bell/phone within reach;with nursing/sitter in room Nurse Communication: Mobility status PT Visit Diagnosis: Unsteadiness on feet (R26.81);Muscle weakness (generalized) (M62.81);Difficulty in walking, not elsewhere classified (R26.2)     Time: 0881-1031 PT Time Calculation (min) (ACUTE ONLY): 16 min  Charges:  $Gait Training: 8-22 mins                     [place] Office [phone] Weekend [phone]    Claretha Cooper 05/06/2022, 4:59 PM

## 2022-05-06 NOTE — Progress Notes (Signed)
[facility] Progress Note  Subjective: seen in room.  Na+ up to 127-128 overnight, 3% dc'd.   Vitals:   05/06/22 0600 05/06/22 0615 05/06/22 0700 05/06/22 0840  BP: (!) 147/64  (!) 148/71 (!) 131/54  Pulse: 96  96 93  Resp: '19  19 18  '$ Temp:  97.9 F (36.6 C)    TempSrc:  Oral    SpO2: 98%  92% 99%  Weight:      Height:        Exam: Gen alert, no distress, elderly pleasant male Chest clear bilat to bases RRR no MRG Abd soft ntnd no mass or ascites +bs Ext 1+ bilat pretib edema Neuro is alert, Ox 3 , nf    Home meds include - asa 81, MVI, sl ntg, crestor, restoril, toprol xl 12.5 qd, prosource qd, flomax, prns/ vits/ supps      BP's normal 110- 125/ 63- 86    Orthostatics >> 103/92        HR 90   lying                              113/63        HR 102 sitting                              107/58        HR 107 standing 0 min                              109/54        HR 106 standing 3 min      UA many bact, >50 wbc, 0-5 rbc/ epis    UNa 14, UCr 125 , UOsm 716     repeat UNa 47, UCr 42, UOsm 294     CXR - no active disease       Assessment/ Plan: Hyponatremia - in patient w/ head & neck cancer, recently completed 1st round chemo and radiation and was having lightheadedness and falls at home. Has a PEG tube. Had LE edema on presentation but w/ clear CXR and clear lungs on exam. UNa and FeNa were low, and the Na+ levels improved from 113 to 117 with 0.9% saline, but then stalled out and did not get better. This suggested more then dehydration. NS 0.9% was dc'd and 3% saline started to which pt responded well and today Na+ 128. 3% saline dc'd. Repeat urine lytes suggest vol depletion has been resolved, and repeat CXR was again clear ruling out any new significant vol overload. Seen by Dr Benay Spice w/ oncology, appreciate recommendations. They think low Na+'s from dehydration and/ or SIADH from cisplatin. I agree. Dr. Benay Spice noted that oncology will f/u his Na+ levels  in the OP setting. For now plan is no further IVF"s or 3%.  Will start salt tabs at 2 gm bid. With lower UOsm does not need fluid restriction or lasix at this time. Get q 6 Na+ level x 2 today Head & neck cancer -stage I/ II. Pt has PEG tube, finished chemo / XRT recently [facility] - sp PPM Morbid obesity OSA Hx of pHTN            Clarence Andrade 05/06/2022, 10:56 AM   Recent Labs  Lab 05/03/22 1040 05/03/22 1452 05/04/22 0200 05/04/22 0548 05/05/22 0430  05/05/22 1545 05/06/22 0414  HGB 10.1*  --  9.3* 9.0*  --   --   --   ALBUMIN 3.1*  --   --   --   --   --   --   CALCIUM 8.7*  --   --  8.4* 8.3*  --  8.4*  PHOS  --    < >  --   --   --  3.8 3.7  CREATININE 0.69  --  0.70 0.71 0.48*  --  0.54*  K 4.9  --   --  4.3 4.3  --  4.3   < > = values in this interval not displayed.    No results for input(s): "IRON", "TIBC", "FERRITIN" in the last 168 hours. Inpatient medications:  aspirin EC  81 mg Oral Daily   cephALEXin  500 mg Oral Q8H   Chlorhexidine Gluconate Cloth  6 each Topical Daily   enoxaparin (LOVENOX) injection  40 mg Subcutaneous Q24H   feeding supplement (OSMOLITE 1.5 CAL)  474 mL Per Tube TID   feeding supplement (PROSource TF20)  60 mL Per Tube BID   free water  100 mL Per Tube Q8H   magnesium oxide  400 mg Oral BID   melatonin  3 mg Oral QHS   multivitamin with minerals  1 tablet Oral Daily   mouth rinse  15 mL Mouth Rinse 4 times per day   rosuvastatin  10 mg Oral Daily   tamsulosin  0.4 mg Oral Daily     nitroGLYCERIN, mouth rinse, QUEtiapine, temazepam

## 2022-05-06 NOTE — Progress Notes (Signed)
IP PROGRESS NOTE  Subjective:   Clarence. Heffron is sitting up in bed, his wife is at the bedside.  He reports odynophagia has improved.     Objective: Vital signs in last 24 hours: Blood pressure (!) 131/54, pulse 93, temperature 97.7 F (36.5 C), temperature source Oral, resp. rate 18, height 6' (1.829 m), weight 261 lb 0.4 oz (118.4 kg), SpO2 99 %.  Intake/Output from previous day: 12/28 0701 - 12/29 0700 In: 1798.4 [I.V.:784.2; IV Piggyback:20.2] Out: 900 [Urine:900]  Physical Exam:  HEENT: Thrush or ulcers Lungs: Clear bilaterally Cardiac: Regular rate and rhythm with an occasional pause  Extremities: Trace pitting edema at the hands and lower leg bilaterally  Neurologic: Alert and oriented  Lab Results: Recent Labs    05/04/22 0200 05/04/22 0548  WBC 5.8 4.2  HGB 9.3* 9.0*  HCT 25.3* 24.5*  PLT 167 175    BMET Recent Labs    05/05/22 0430 05/05/22 0600 05/06/22 0414 05/06/22 0637  NA 123*   < > 127* 128*  K 4.3  --  4.3  --   CL 87*  --  91*  --   CO2 28  --  31  --   GLUCOSE 106*  --  119*  --   BUN 11  --  11  --   CREATININE 0.48*  --  0.54*  --   CALCIUM 8.3*  --  8.4*  --    < > = values in this interval not displayed.    No results found for: "CEA1", "CEA", "DJS970", "CA125"  Studies/Results: DG CHEST PORT 1 VIEW  Result Date: 05/05/2022 CLINICAL DATA:  Shortness of breath EXAM: PORTABLE CHEST 1 VIEW COMPARISON:  05/03/2022 FINDINGS: Left pacer and right Port-A-Cath remain in place, unchanged. Heart and mediastinal contours are within normal limits. No focal opacities or effusions. No acute bony abnormality. IMPRESSION: No active cardiopulmonary disease. Electronically Signed   By: Rolm Baptise M.D.   On: 05/05/2022 15:22    Medications: I have reviewed the patient's current medications.  Assessment/Plan: Oropharyngeal cancer, T2 N1, status post cisplatin and radiation, last cisplatin 04/13/2022, last radiation 04/25/2022 CT cervical spine  05/03/2022-posttreatment change in the left neck, decreased size of left level 2 nodal mass and decreased size of left sided oropharyngeal mass Odynophagia secondary to radiation, feeding tube in place Admission 05/03/2022 with altered mental status secondary to hyponatremia Hyponatremia Hypomagnesemia Klebsiella UTI 05/03/2022 Hypertension  Clarence Andrade recently completed treatment for head neck cancer.  There is no clinical or radiologic evidence for progression of the head neck cancer.  He was admitted with altered mental status, likely secondary to hyponatremia.  The hyponatremia has improved with saline hydration.  In the hyponatremia may be related to toxicity from cisplatin, either SIADH or renal salt wasting.  However the admission labs did not suggest SIADH.  Hypomagnesemia is likely secondary to cisplatin.   Recommendations: Continue acute management of the hyponatremia per nephrology Replete magnesium Please call oncology as needed, outpatient follow-up will be scheduled at the Cancer center for next week  LOS: 3 days   Betsy Coder, MD   05/06/2022, 3:00 PM

## 2022-05-07 DIAGNOSIS — E440 Moderate protein-calorie malnutrition: Secondary | ICD-10-CM

## 2022-05-07 DIAGNOSIS — R296 Repeated falls: Secondary | ICD-10-CM

## 2022-05-07 LAB — CUP PACEART REMOTE DEVICE CHECK
Battery Remaining Longevity: 134 mo
Battery Voltage: 3.02 V
Brady Statistic AP VP Percent: 2.9 %
Brady Statistic AP VS Percent: 2.01 %
Brady Statistic AS VP Percent: 32.91 %
Brady Statistic AS VS Percent: 62.18 %
Brady Statistic RA Percent Paced: 4.08 %
Brady Statistic RV Percent Paced: 35.81 %
Date Time Interrogation Session: 20231230223833
Implantable Lead Connection Status: 753985
Implantable Lead Connection Status: 753985
Implantable Lead Implant Date: 20200702
Implantable Lead Implant Date: 20200702
Implantable Lead Location: 753859
Implantable Lead Location: 753860
Implantable Lead Model: 5076
Implantable Lead Model: 5076
Implantable Pulse Generator Implant Date: 20200702
Lead Channel Impedance Value: 323 Ohm
Lead Channel Impedance Value: 342 Ohm
Lead Channel Impedance Value: 380 Ohm
Lead Channel Impedance Value: 418 Ohm
Lead Channel Pacing Threshold Amplitude: 0.5 V
Lead Channel Pacing Threshold Amplitude: 0.75 V
Lead Channel Pacing Threshold Pulse Width: 0.4 ms
Lead Channel Pacing Threshold Pulse Width: 0.4 ms
Lead Channel Sensing Intrinsic Amplitude: 3.875 mV
Lead Channel Sensing Intrinsic Amplitude: 3.875 mV
Lead Channel Sensing Intrinsic Amplitude: 7.5 mV
Lead Channel Sensing Intrinsic Amplitude: 7.5 mV
Lead Channel Setting Pacing Amplitude: 1.5 V
Lead Channel Setting Pacing Amplitude: 2 V
Lead Channel Setting Pacing Pulse Width: 0.4 ms
Lead Channel Setting Sensing Sensitivity: 2.8 mV
Zone Setting Status: 755011
Zone Setting Status: 755011

## 2022-05-07 LAB — COMPREHENSIVE METABOLIC PANEL
ALT: 29 U/L (ref 0–44)
AST: 28 U/L (ref 15–41)
Albumin: 2.7 g/dL — ABNORMAL LOW (ref 3.5–5.0)
Alkaline Phosphatase: 56 U/L (ref 38–126)
Anion gap: 5 (ref 5–15)
BUN: 19 mg/dL (ref 8–23)
CO2: 33 mmol/L — ABNORMAL HIGH (ref 22–32)
Calcium: 8.3 mg/dL — ABNORMAL LOW (ref 8.9–10.3)
Chloride: 90 mmol/L — ABNORMAL LOW (ref 98–111)
Creatinine, Ser: 0.51 mg/dL — ABNORMAL LOW (ref 0.61–1.24)
GFR, Estimated: >60 mL/min (ref >60)
Glucose, Bld: 110 mg/dL — ABNORMAL HIGH (ref 70–99)
Potassium: 4.9 mmol/L (ref 3.5–5.1)
Sodium: 128 mmol/L — ABNORMAL LOW (ref 135–145)
Total Bilirubin: 0.5 mg/dL (ref 0.3–1.2)
Total Protein: 5.7 g/dL — ABNORMAL LOW (ref 6.5–8.1)

## 2022-05-07 LAB — SODIUM: Sodium: 129 mmol/L — ABNORMAL LOW (ref 135–145)

## 2022-05-07 LAB — MAGNESIUM: Magnesium: 1.7 mg/dL (ref 1.7–2.4)

## 2022-05-07 LAB — GLUCOSE, CAPILLARY
Glucose-Capillary: 97 mg/dL (ref 70–99)
Glucose-Capillary: 100 mg/dL — ABNORMAL HIGH (ref 70–99)
Glucose-Capillary: 101 mg/dL — ABNORMAL HIGH (ref 70–99)

## 2022-05-07 LAB — PHOSPHORUS: Phosphorus: 3.2 mg/dL (ref 2.5–4.6)

## 2022-05-07 MED ORDER — HEPARIN SOD (PORK) LOCK FLUSH 100 UNIT/ML IV SOLN
500.0000 [IU] | INTRAVENOUS | Status: AC | PRN
  Administered 2022-05-07: 500 [IU]
  Filled 2022-05-07: qty 5

## 2022-05-07 MED ORDER — CEPHALEXIN 500 MG PO CAPS: 500.0000 mg | ORAL_CAPSULE | Freq: Three times a day (TID) | ORAL | 0 refills | Status: AC

## 2022-05-07 MED ORDER — FREE WATER: 200.0000 mL | Freq: Three times a day (TID) | Status: DC

## 2022-05-07 MED ORDER — SODIUM CHLORIDE 1 G PO TABS: 2.0000 g | ORAL_TABLET | Freq: Two times a day (BID) | ORAL | 3 refills | Status: DC

## 2022-05-07 NOTE — Progress Notes (Signed)
Pt discharge to main exit via wheelchair to wife with all belongings returned. Ventura Bruns, RN 05/07/22 2:30 PM

## 2022-05-07 NOTE — Discharge Summary (Signed)
Physician Discharge Summary  Clarence Andrade:[id number] DOB: 01-11-1946 DOA: 05/03/2022  PCP: Kathalene Frames, MD  Admit date: 05/03/2022 Discharge date: 05/07/2022  Admitted From: Home Disposition:  Home  Recommendations for Outpatient Follow-up:  Follow up with Oncology in 1-2 weeks Please obtain BMP/CBC in one week   Home Health:No Equipment/Devices:None  Discharge Condition:stable CODE STATUS:Full Diet recommendation: Heart Healthy   Brief/Interim Summary: 76 y.o. male past medical history of essential hypertension, squamous cell carcinoma of the oropharynx finished chemotherapy and radiation therapy this month by Dr. Chryl Heck, with a PEG tube in place comes in with progressive weakness and hallucinations along with falls since she was recently prescribed scopolamine patch.  She has had orthostatic symptoms for the last several days along with a cough.  With hyponatremia nephrology was consulted   Discharge Diagnoses:  Principal Problem:   Hypokalemia Active Problems:   Hyponatremia   Malnutrition of moderate degree  Hyponatremia with frequent falls and unsteady gait, Nephrology was consulted recommended urinary osmolarity sodium and creatinin. It was determined he was dehydrated with possible touch of SIADH due to cisplatin. He was started on 3% saline and his sodium came up to 128. Oncology was also assisted with degree with current management. Blood cultures remain negative. He will go home on sodium tablets and recheck of basic metabolic panel with oncology in 1 week.  Hypomagnesemia: Likely due to cisplatin now resolved.  Anasarca bilateral lower extremity edema: Lower extremity Doppler was negative 2D echo showed no acute findings. He was weaned to room air. Likely due to protein caloric malnutrition.  Essential hypertension: Lisinopril and Imdur were held he was continue on metoprolol. Will have to be reevaluated as an outpatient restart medications  as tolerated.  CAD: Continue hold Imdur, as his blood pressure is borderline continue aspirin and statins.  UTI: Urine culture grew Coxiella he was started on Flomax we will continue Keflex for total of 7 days as an outpatient.  BPH: Flomax was held about 2 months ago, it was resumed.  Squamous cell carcinoma of the oropharynx: Will follow-up with oncology as an outpatient.  Failure to thrive/weakness/falls: Physical therapy evaluated the patient recommended home health PT.  Moderate protein caloric malnutrition/nutrition: Continue tube feeding with boost.  Discharge Instructions  Discharge Instructions     Diet - low sodium heart healthy   Complete by: As directed    Increase activity slowly   Complete by: As directed       Allergies as of 05/07/2022       Reactions   Sertraline Hcl Other (See Comments)   Tremor, HA, Decreased Libido   Transderm-scop [scopolamine] Other (See Comments)   Tremors, hallucinations, weakness        Medication List     STOP taking these medications    isosorbide mononitrate 30 MG 24 hr tablet Commonly known as: IMDUR   multivitamin tablet       TAKE these medications    acetaminophen 500 MG tablet Commonly known as: TYLENOL Place 1,000 mg into feeding tube every 6 (six) hours as needed (for pain).   Arexvy 120 MCG/0.5ML injection Generic drug: RSV vaccine recomb adjuvanted Inject into the muscle.   aspirin EC 81 MG tablet 81 mg See admin instructions. 81 mg, per tube, once a day   buPROPion 150 MG 24 hr tablet Commonly known as: WELLBUTRIN XL 150 mg See admin instructions. 150 mg, per tube, every morning   cephALEXin 500 MG capsule Commonly known as: KEFLEX Take 1  capsule (500 mg total) by mouth every 8 (eight) hours for 4 days.   feeding supplement (PROSource TF20) liquid Place 60 mLs into feeding tube daily.   Fluad Quadrivalent 0.5 ML injection Generic drug: influenza vaccine adjuvanted Inject into the  muscle.   free water Soln Place 200 mLs into feeding tube every 8 (eight) hours.   Isosource Liqd Place into feeding tube See admin instructions. Three feedings a day   feeding supplement (ENSURE COMPLETE) Liqd Place 237 mLs into feeding tube See admin instructions. Mix 237 ml's of Ensure Complete with 237 ml's of milk = 3 feedings a day   metoprolol succinate 25 MG 24 hr tablet Commonly known as: TOPROL-XL Take 0.5 tablets (12.5 mg total) by mouth daily.   nitroGLYCERIN 0.4 MG SL tablet Commonly known as: NITROSTAT Place 1 tablet (0.4 mg total) under the tongue every 5 (five) minutes as needed for chest pain.   Pfizer COVID-19 Vac Bivalent injection Generic drug: COVID-19 mRNA bivalent vaccine Therapist, music) Inject into the muscle.   Pfizer-BioNT COVID-19 Vac-TriS Susp injection Generic drug: COVID-19 mRNA Vac-TriS (Pfizer) Inject into the muscle.   Comirnaty syringe Generic drug: COVID-19 mRNA vaccine 2023-2024 Inject into the muscle.   rosuvastatin 10 MG tablet Commonly known as: CRESTOR TAKE 1 TABLET ONCE DAILY. What changed: how to take this   scopolamine 1 MG/3DAYS Commonly known as: TRANSDERM-SCOP Place 1 patch (1.5 mg total) onto the skin every 3 (three) days.   tamsulosin 0.4 MG Caps capsule Commonly known as: FLOMAX Take 0.4 mg by mouth daily.   temazepam 15 MG capsule Commonly known as: RESTORIL Take 1 capsule (15 mg total) by mouth at bedtime as needed for sleep. What changed:  how to take this when to take this        Allergies  Allergen Reactions   Sertraline Hcl Other (See Comments)    Tremor, HA, Decreased Libido   Transderm-Scop [Scopolamine] Other (See Comments)    Tremors, hallucinations, weakness    Consultations: Nephrology Oncology   Procedures/Studies: DG CHEST PORT 1 VIEW  Result Date: 05/05/2022 CLINICAL DATA:  Shortness of breath EXAM: PORTABLE CHEST 1 VIEW COMPARISON:  05/03/2022 FINDINGS: Left pacer and right Port-A-Cath  remain in place, unchanged. Heart and mediastinal contours are within normal limits. No focal opacities or effusions. No acute bony abnormality. IMPRESSION: No active cardiopulmonary disease. Electronically Signed   By: Rolm Baptise M.D.   On: 05/05/2022 15:22   ECHOCARDIOGRAM COMPLETE  Result Date: 05/04/2022    ECHOCARDIOGRAM REPORT   Patient Name:   BAYLIN GAMBLIN Date of Exam: 05/04/2022 Medical Rec #:  [id number]       Height:       72.0 in Accession #:    [id number]      Weight:       268.0 lb Date of Birth:  02/10/1946       BSA:          2.413 m Patient Age:    82 years        BP:           131/96 mmHg Patient Gender: M               HR:           99 bpm. Exam Location:  Inpatient Procedure: 2D Echo and Intracardiac Opacification Agent Indications:    CHF  History:        Patient has prior history of Echocardiogram examinations, most  recent 03/10/2020. Signs/Symptoms:Murmur.  Sonographer:    Harvie Junior Referring Phys: [id number] FANG XU  Sonographer Comments: Technically difficult study due to poor echo windows and patient is obese. Image acquisition challenging due to patient body habitus and Image acquisition challenging due to respiratory motion. IMPRESSIONS  1. Limited study due to poor echo windows.  2. Left ventricular ejection fraction, by estimation, is 55 to 60%. The left ventricle has normal function. The left ventricle has no regional wall motion abnormalities. There is mild concentric left ventricular hypertrophy. Left ventricular diastolic parameters are indeterminate.  3. Right ventricular systolic function is normal. The right ventricular size is not well visualized. There is normal pulmonary artery systolic pressure.  4. The mitral valve is grossly normal. Trivial mitral valve regurgitation. No evidence of mitral stenosis.  5. There is at least mild aortic stenosis and mean gradient may be underestimated due to suboptimal doppler angle of interrogation for both LVOT VTI and  AoV VTI. The aortic valve is calcified. Aortic valve regurgitation is not visualized. There is at least mild aortic stenosis. Aortic valve mean gradient measures 15.7 mmHg. Aortic valve Vmax measures 2.53 m/s.  6. Aortic dilatation noted. There is borderline dilatation of the aortic root, measuring 40 mm. Comparison(s): Compared to prior TTE in 2021, there continues to be at least mild AS (prior mean gradient 9mHg). Otherwise, there is no significant change. FINDINGS  Left Ventricle: Left ventricular ejection fraction, by estimation, is 55 to 60%. The left ventricle has normal function. The left ventricle has no regional wall motion abnormalities. The left ventricular internal cavity size was normal in size. There is  mild concentric left ventricular hypertrophy. Left ventricular diastolic parameters are indeterminate. Right Ventricle: The right ventricular size is not well visualized. Right vetricular wall thickness was not well visualized. Right ventricular systolic function is normal. There is normal pulmonary artery systolic pressure. The tricuspid regurgitant velocity is 2.77 m/s, and with an assumed right atrial pressure of 3 mmHg, the estimated right ventricular systolic pressure is 360.1mmHg. Left Atrium: Left atrial size was not well visualized. Right Atrium: Right atrial size was not well visualized. Pericardium: There is no evidence of pericardial effusion. Mitral Valve: The mitral valve is grossly normal. There is mild thickening of the mitral valve leaflet(s). There is mild calcification of the mitral valve leaflet(s). Trivial mitral valve regurgitation. No evidence of mitral valve stenosis. Tricuspid Valve: The tricuspid valve is grossly normal. Tricuspid valve regurgitation is trivial. Aortic Valve: There is at least mild aortic stenosis and mean gradient may be underestimated due to suboptimal doppler angle of interrogation for both LVOT VTI and AoV VTI. The aortic valve is calcified. Aortic valve  regurgitation is not visualized. There is at least mild aortic stenosis. Aortic valve mean gradient measures 15.7 mmHg. Aortic valve peak gradient measures 25.6 mmHg. Aortic valve area, by VTI measures 1.74 cm. Pulmonic Valve: The pulmonic valve was not well visualized. Aorta: Aortic dilatation noted. There is borderline dilatation of the aortic root, measuring 40 mm. IAS/Shunts: The atrial septum is grossly normal.  LEFT VENTRICLE PLAX 2D LVIDd:         4.20 cm      Diastology LVIDs:         2.60 cm      LV e' medial:    9.90 cm/s LV PW:         1.30 cm      LV E/e' medial:  8.7 LV IVS:  1.30 cm      LV e' lateral:   11.40 cm/s LVOT diam:     2.30 cm      LV E/e' lateral: 7.6 LV SV:         66 LV SV Index:   28 LVOT Area:     4.15 cm  LV Volumes (MOD) LV vol d, MOD A4C: 104.0 ml LV vol s, MOD A4C: 50.1 ml LV SV MOD A4C:     104.0 ml RIGHT VENTRICLE RV S prime:     15.10 cm/s TAPSE (M-mode): 2.1 cm LEFT ATRIUM             Index        RIGHT ATRIUM           Index LA Vol (A2C):   57.2 ml 23.71 ml/m  RA Area:     17.40 cm LA Vol (A4C):   62.0 ml 25.70 ml/m  RA Volume:   47.50 ml  19.69 ml/m LA Biplane Vol: 60.4 ml 25.03 ml/m  AORTIC VALVE                     PULMONIC VALVE AV Area (Vmax):    1.44 cm      PV Vmax:       1.37 m/s AV Area (Vmean):   1.79 cm      PV Peak grad:  7.5 mmHg AV Area (VTI):     1.74 cm AV Vmax:           252.80 cm/s AV Vmean:          163.667 cm/s AV VTI:            0.382 m AV Peak Grad:      25.6 mmHg AV Mean Grad:      15.7 mmHg LVOT Vmax:         87.50 cm/s LVOT Vmean:        70.500 cm/s LVOT VTI:          0.160 m LVOT/AV VTI ratio: 0.42  AORTA Ao Root diam: 3.60 cm MITRAL VALVE               TRICUSPID VALVE MV Area (PHT): 6.17 cm    TR Peak grad:   30.7 mmHg MV Decel Time: 123 msec    TR Vmax:        277.00 cm/s MR Peak grad: 76.2 mmHg MR Vmax:      436.33 cm/s  SHUNTS MV E velocity: 86.40 cm/s  Systemic VTI:  0.16 m MV A velocity: 50.30 cm/s  Systemic Diam: 2.30 cm MV E/A  ratio:  1.72 Gwyndolyn Kaufman MD Electronically signed by Gwyndolyn Kaufman MD Signature Date/Time: 05/04/2022/4:45:28 PM    Final    VAS [place] LOWER EXTREMITY VENOUS (DVT)  Result Date: 05/03/2022  Lower Venous DVT Study Patient Name:  SMITH POTENZA  Date of Exam:   05/03/2022 Medical Rec #: [id number]        Accession #:    [id number] Date of Birth: [birth year]        Patient Gender: M Patient Age:   12 years Exam Location:  [facility] Procedure:      VAS [place] LOWER EXTREMITY VENOUS (DVT) Referring Phys: Annamaria Boots XU --------------------------------------------------------------------------------  Indications: Edema.  Risk Factors: Cancer. Limitations: Poor ultrasound/tissue interface. Comparison Study: No prior studies. Performing Technologist: Oliver Hum RVT  Examination Guidelines: A complete evaluation includes B-mode imaging, spectral Doppler, color Doppler, and  power Doppler as needed of all accessible portions of each vessel. Bilateral testing is considered an integral part of a complete examination. Limited examinations for reoccurring indications may be performed as noted. The reflux portion of the exam is performed with the patient in reverse Trendelenburg.  +---------+---------------+---------+-----------+----------+--------------+ RIGHT    CompressibilityPhasicitySpontaneityPropertiesThrombus Aging +---------+---------------+---------+-----------+----------+--------------+ CFV      Full           Yes      Yes                                 +---------+---------------+---------+-----------+----------+--------------+ SFJ      Full                                                        +---------+---------------+---------+-----------+----------+--------------+ FV Prox  Full                                                        +---------+---------------+---------+-----------+----------+--------------+ FV Mid   Full                                                         +---------+---------------+---------+-----------+----------+--------------+ FV DistalFull                                                        +---------+---------------+---------+-----------+----------+--------------+ PFV      Full                                                        +---------+---------------+---------+-----------+----------+--------------+ POP      Full           Yes      Yes                                 +---------+---------------+---------+-----------+----------+--------------+ PTV      Full                                                        +---------+---------------+---------+-----------+----------+--------------+ PERO     Full                                                        +---------+---------------+---------+-----------+----------+--------------+   +---------+---------------+---------+-----------+----------+-------------------+  LEFT     CompressibilityPhasicitySpontaneityPropertiesThrombus Aging      +---------+---------------+---------+-----------+----------+-------------------+ CFV      Full           Yes      Yes                                      +---------+---------------+---------+-----------+----------+-------------------+ SFJ      Full                                                             +---------+---------------+---------+-----------+----------+-------------------+ FV Prox  Full                                                             +---------+---------------+---------+-----------+----------+-------------------+ FV Mid   Full                                                             +---------+---------------+---------+-----------+----------+-------------------+ FV DistalFull                                                             +---------+---------------+---------+-----------+----------+-------------------+ PFV      Full                                                              +---------+---------------+---------+-----------+----------+-------------------+ POP      Full           Yes      Yes                                      +---------+---------------+---------+-----------+----------+-------------------+ PTV      Full                                                             +---------+---------------+---------+-----------+----------+-------------------+ PERO                                                  Not well visualized +---------+---------------+---------+-----------+----------+-------------------+     Summary: RIGHT: - There is no  evidence of deep vein thrombosis in the lower extremity. However, portions of this examination were limited- see technologist comments above.  - No cystic structure found in the popliteal fossa.  LEFT: - There is no evidence of deep vein thrombosis in the lower extremity. However, portions of this examination were limited- see technologist comments above.  - No cystic structure found in the popliteal fossa.  *See table(s) above for measurements and observations. Electronically signed by Jamelle Haring on 05/03/2022 at 4:33:57 PM.    Final    CT Head Wo Contrast  Result Date: 05/03/2022 CLINICAL DATA:  Head trauma, moderate-severe; Neck trauma (Age >= 65y). Multiple recent falls. Currently undergoing treatment for oropharyngeal cancer. EXAM: CT HEAD WITHOUT CONTRAST CT CERVICAL SPINE WITHOUT CONTRAST TECHNIQUE: Multidetector CT imaging of the head and cervical spine was performed following the standard protocol without intravenous contrast. Multiplanar CT image reconstructions of the cervical spine were also generated. RADIATION DOSE REDUCTION: This exam was performed according to the departmental dose-optimization program which includes automated exposure control, adjustment of the mA and/or kV according to patient size and/or use of iterative reconstruction technique. COMPARISON:  Neck CT  02/02/2022 FINDINGS: CT HEAD FINDINGS Brain: There is no evidence of an acute infarct, intracranial hemorrhage, mass, midline shift, or extra-axial fluid collection. The ventricles and sulci are within normal limits for age. Vascular: Calcified atherosclerosis at the skull base. No hyperdense vessel. Skull: No acute fracture or suspicious osseous lesion. Sinuses/Orbits: Mild right ethmoid air cell mucosal thickening. Clear mastoid air cells. Unremarkable orbits. Other: None. CT CERVICAL SPINE FINDINGS Alignment: Normal. Skull base and vertebrae: No acute fracture or suspicious osseous lesion. Soft tissues and spinal canal: No prevertebral fluid or swelling. No visible canal hematoma. Disc levels: Moderate cervical spondylosis and mild-to-moderate facet arthrosis. Multilevel neural foraminal stenosis due to uncovertebral spurring, moderate to severe on the right greater than left at C5-6, left greater than right at C6-7, and on the right at C7-T1. Upper chest: Clear lung apices. Other: Post treatment changes in the neck with mucosal edema. Decreased size of the left level II nodal mass and decreased size or possible resolution of the left-sided oropharyngeal mass, not primarily evaluated on this study. IMPRESSION: No evidence of acute intracranial abnormality or cervical spine fracture. Electronically Signed   By: Logan Bores M.D.   On: 05/03/2022 10:38   CT Cervical Spine Wo Contrast  Result Date: 05/03/2022 CLINICAL DATA:  Head trauma, moderate-severe; Neck trauma (Age >= 65y). Multiple recent falls. Currently undergoing treatment for oropharyngeal cancer. EXAM: CT HEAD WITHOUT CONTRAST CT CERVICAL SPINE WITHOUT CONTRAST TECHNIQUE: Multidetector CT imaging of the head and cervical spine was performed following the standard protocol without intravenous contrast. Multiplanar CT image reconstructions of the cervical spine were also generated. RADIATION DOSE REDUCTION: This exam was performed according to the  departmental dose-optimization program which includes automated exposure control, adjustment of the mA and/or kV according to patient size and/or use of iterative reconstruction technique. COMPARISON:  Neck CT 02/02/2022 FINDINGS: CT HEAD FINDINGS Brain: There is no evidence of an acute infarct, intracranial hemorrhage, mass, midline shift, or extra-axial fluid collection. The ventricles and sulci are within normal limits for age. Vascular: Calcified atherosclerosis at the skull base. No hyperdense vessel. Skull: No acute fracture or suspicious osseous lesion. Sinuses/Orbits: Mild right ethmoid air cell mucosal thickening. Clear mastoid air cells. Unremarkable orbits. Other: None. CT CERVICAL SPINE FINDINGS Alignment: Normal. Skull base and vertebrae: No acute fracture or suspicious osseous lesion. Soft tissues and spinal  canal: No prevertebral fluid or swelling. No visible canal hematoma. Disc levels: Moderate cervical spondylosis and mild-to-moderate facet arthrosis. Multilevel neural foraminal stenosis due to uncovertebral spurring, moderate to severe on the right greater than left at C5-6, left greater than right at C6-7, and on the right at C7-T1. Upper chest: Clear lung apices. Other: Post treatment changes in the neck with mucosal edema. Decreased size of the left level II nodal mass and decreased size or possible resolution of the left-sided oropharyngeal mass, not primarily evaluated on this study. IMPRESSION: No evidence of acute intracranial abnormality or cervical spine fracture. Electronically Signed   By: Logan Bores M.D.   On: 05/03/2022 10:38   DG Chest 2 View  Result Date: 05/03/2022 CLINICAL DATA:  Cough, weakness EXAM: CHEST - 2 VIEW COMPARISON:  11/08/2018 FINDINGS: Right IJ approach chest port with distal tip terminating the level of the distal SVC. Left-sided implanted cardiac device remains in place. Cardiomegaly. No focal airspace consolidation, pleural effusion, or pneumothorax.  IMPRESSION: No active cardiopulmonary disease. Electronically Signed   By: Davina Poke D.O.   On: 05/03/2022 10:25   IR GJ Tube Change  Result Date: 04/16/2022 INDICATION: 76 year old gentleman with history of head and neck malignancy underwent percutaneous gastrostomy tube placement on 03/08/2022. He returns with difficulty flushing the tube. EXAM: Gastrostomy tube exchange MEDICATIONS: None ANESTHESIA/SEDATION: None CONTRAST:  10 mL of Omnipaque 300-administered into the gastric lumen. FLUOROSCOPY: Radiation Exposure Index (as provided by the fluoroscopic device): 50 mGy Kerma COMPLICATIONS: None immediate. PROCEDURE: Informed written consent was obtained from the patient after a thorough discussion of the procedural risks, benefits and alternatives. All questions were addressed. Maximal Sterile Barrier Technique was utilized including caps, mask, sterile gowns, sterile gloves, sterile drape, hand hygiene and skin antiseptic. A timeout was performed prior to the initiation of the procedure. Contrast administered through the gastrostomy tube showed continued communication with the gastric lumen. However, the balloon was located external to the stomach. 0.035 inch glidewire was advanced through the existing gastrostomy tube. The balloon was deflated and the existing 47 [place] G-tube was removed. New 18 French gastrostomy tube could not be easily advanced into the gastric lumen. Tract dilation was performed with 18 and 20 [place] dilators. New 18 French gastrostomy tube was then advanced into the gastric lumen without difficulty. The balloon was inflated with 10 mL of normal saline. Contrast administered through the new 18 [place] gastrostomy tube confirmed appropriate positioning within the gastric lumen. The tube was flushed and covered with sterile dressing. IMPRESSION: Successful exchange of retracted percutaneous gastrostomy tube with new 91 French balloon retention tube in place. Electronically Signed    By: Miachel Roux M.D.   On: 04/16/2022 08:16     Subjective: No complaints feels better  Discharge Exam: Vitals:   05/07/22 0500 05/07/22 0600  BP:    Pulse: (!) 107 86  Resp: (!) 28 20  Temp:    SpO2: 100% 99%   Vitals:   05/07/22 0300 05/07/22 0400 05/07/22 0500 05/07/22 0600  BP: (!) 136/57 123/78    Pulse: 88 84 (!) 107 86  Resp: 19 14 (!) 28 20  Temp: 97.9 F (36.6 C)     TempSrc: Oral     SpO2: 99% 100% 100% 99%  Weight:      Height:        General: Pt is alert, awake, not in acute distress Cardiovascular: RRR, S1/S2 +, no rubs, no gallops Respiratory: CTA bilaterally, no wheezing, no rhonchi Abdominal:  Soft, NT, ND, bowel sounds + Extremities: no edema, no cyanosis    The results of significant diagnostics from this hospitalization (including imaging, microbiology, ancillary and laboratory) are listed below for reference.     Microbiology: Recent Results (from the past 240 hour(s))  Resp panel by RT-PCR (RSV, Flu A&B, Covid) Peripheral     Status: None   Collection Time: 05/03/22  9:50 AM   Specimen: Peripheral; Nasal Swab  Result Value Ref Range Status   SARS Coronavirus 2 by RT PCR NEGATIVE NEGATIVE Final    Comment: (NOTE) SARS-CoV-2 target nucleic acids are NOT DETECTED.  The SARS-CoV-2 RNA is generally detectable in upper respiratory specimens during the acute phase of infection. The lowest concentration of SARS-CoV-2 viral copies this assay can detect is 138 copies/mL. A negative result does not preclude SARS-Cov-2 infection and should not be used as the sole basis for treatment or other patient management decisions. A negative result may occur with  improper specimen collection/handling, submission of specimen other than nasopharyngeal swab, presence of viral mutation(s) within the areas targeted by this assay, and inadequate number of viral copies(<138 copies/mL). A negative result must be combined with clinical observations, patient history,  and epidemiological information. The expected result is Negative.  Fact Sheet for Patients:  EntrepreneurPulse.com.au  Fact Sheet for Healthcare Providers:  IncredibleEmployment.be  This test is no t yet approved or cleared by the [place] FDA and  has been authorized for detection and/or diagnosis of SARS-CoV-2 by FDA under an Emergency Use Authorization (EUA). This EUA will remain  in effect (meaning this test can be used) for the duration of the COVID-19 declaration under Section 564(b)(1) of the Act, 21 U.S.C.section 360bbb-3(b)(1), unless the authorization is terminated  or revoked sooner.       Influenza A by PCR NEGATIVE NEGATIVE Final   Influenza B by PCR NEGATIVE NEGATIVE Final    Comment: (NOTE) The Xpert Xpress SARS-CoV-2/FLU/RSV plus assay is intended as an aid in the diagnosis of influenza from Nasopharyngeal swab specimens and should not be used as a sole basis for treatment. Nasal washings and aspirates are unacceptable for Xpert Xpress SARS-CoV-2/FLU/RSV testing.  Fact Sheet for Patients: EntrepreneurPulse.com.au  Fact Sheet for Healthcare Providers: IncredibleEmployment.be  This test is not yet approved or cleared by the [place] FDA and has been authorized for detection and/or diagnosis of SARS-CoV-2 by FDA under an Emergency Use Authorization (EUA). This EUA will remain in effect (meaning this test can be used) for the duration of the COVID-19 declaration under Section 564(b)(1) of the Act, 21 U.S.C. section 360bbb-3(b)(1), unless the authorization is terminated or revoked.     Resp Syncytial Virus by PCR NEGATIVE NEGATIVE Final    Comment: (NOTE) Fact Sheet for Patients: EntrepreneurPulse.com.au  Fact Sheet for Healthcare Providers: IncredibleEmployment.be  This test is not yet approved or cleared by the [place] FDA and has  been authorized for detection and/or diagnosis of SARS-CoV-2 by FDA under an Emergency Use Authorization (EUA). This EUA will remain in effect (meaning this test can be used) for the duration of the COVID-19 declaration under Section 564(b)(1) of the Act, 21 U.S.C. section 360bbb-3(b)(1), unless the authorization is terminated or revoked.  Performed at [facility], [place] [address] [place], [place] [address]   Blood culture (routine x 2)     Status: None (Preliminary result)   Collection Time: 05/03/22 10:40 AM   Specimen: BLOOD  Result Value Ref Range Status   Specimen Description  Final    BLOOD SITE NOT SPECIFIED Performed at [facility], [place] [address] [place], [place] [address]    Special Requests   Final    BOTTLES DRAWN AEROBIC AND ANAEROBIC Blood Culture adequate volume Performed at [place] [address], [place], [place] [address]    Culture   Final    NO GROWTH 4 DAYS Performed at [facility], [place] [address], [place], [place] [address]    Report Status PENDING  Incomplete  Blood culture (routine x 2)     Status: None (Preliminary result)   Collection Time: 05/03/22 10:46 AM   Specimen: BLOOD LEFT ARM  Result Value Ref Range Status   Specimen Description   Final    BLOOD LEFT ARM Performed at [place] [address] [place], [place] [address]    Special Requests   Final    BOTTLES DRAWN AEROBIC AND ANAEROBIC Blood Culture adequate volume Performed at [place] [address] [place], [place] [address]    Culture   Final    NO GROWTH 4 DAYS Performed at [facility], [place] [address], [place], [place] [address]    Report Status PENDING  Incomplete  Urine Culture     Status: Abnormal   Collection Time: 05/03/22 12:37 PM   Specimen: Urine, Clean Catch  Result Value Ref Range Status   Specimen Description   Final    URINE, CLEAN  CATCH Performed at [facility], [place] [address], [place], [place] [address]    Special Requests   Final    NONE Performed at [facility], [place] [address] [place], [place] [address]    Culture >=100,000 COLONIES/mL KLEBSIELLA PNEUMONIAE (A)  Final   Report Status 05/05/2022 FINAL  Final   Organism ID, Bacteria KLEBSIELLA PNEUMONIAE (A)  Final      Susceptibility   Klebsiella pneumoniae - MIC*    AMPICILLIN >=32 RESISTANT Resistant     CEFAZOLIN <=4 SENSITIVE Sensitive     CEFEPIME <=0.12 SENSITIVE Sensitive     CEFTRIAXONE <=0.25 SENSITIVE Sensitive     CIPROFLOXACIN <=0.25 SENSITIVE Sensitive     GENTAMICIN <=1 SENSITIVE Sensitive     IMIPENEM <=0.25 SENSITIVE Sensitive     NITROFURANTOIN 64 INTERMEDIATE Intermediate     TRIMETH/SULFA <=20 SENSITIVE Sensitive     AMPICILLIN/SULBACTAM 4 SENSITIVE Sensitive     PIP/TAZO <=4 SENSITIVE Sensitive     * >=100,000 COLONIES/mL KLEBSIELLA PNEUMONIAE     Labs: BNP (last 3 results) Recent Labs    05/03/22 1040  BNP 793.9*   Basic Metabolic Panel: Recent Labs  Lab 05/03/22 1040 05/03/22 1040 05/03/22 1452 05/03/22 2249 05/04/22 0200 05/04/22 0548 05/04/22 1012 05/05/22 0430 05/05/22 0600 05/05/22 1545 05/05/22 1900 05/06/22 0414 05/06/22 0637 05/06/22 1508 05/06/22 1700 05/06/22 1831 05/06/22 1949 05/07/22 0154  NA 114*  --  115*   < > 117* 117*   < > 123*   < > 125*   < > 127* 128* 128*  --  123* 129* 128*  K 4.9  --   --   --   --  4.3  --  4.3  --   --   --  4.3  --   --   --   --   --  4.9  CL 76*  --   --   --   --  81*  --  87*  --   --   --  91*  --   --   --   --   --  90*  CO2 28  --   --   --   --  27  --  28  --   --   --  31  --   --   --   --   --  33*  GLUCOSE 108*  --   --   --   --  110*  --  106*  --   --   --  119*  --   --   --   --   --  110*  BUN 23  --   --   --   --  21  --  11  --   --   --  11  --   --   --   --   --  19  CREATININE 0.69  --   --   --   0.70 0.71  --  0.48*  --   --   --  0.54*  --   --   --   --   --  0.51*  CALCIUM 8.7*  --   --   --   --  8.4*  --  8.3*  --   --   --  8.4*  --   --   --   --   --  8.3*  MG  --    < > 1.2*  --   --  1.4*  --   --   --  1.6*  --  1.6*  --   --  1.9  --   --  1.7  PHOS  --   --  3.2  --   --   --   --   --   --  3.8  --  3.7  --   --  3.0  --   --  3.2   < > = values in this interval not displayed.   Liver Function Tests: Recent Labs  Lab 05/03/22 1040 05/07/22 0154  AST 62* 28  ALT 39 29  ALKPHOS 57 56  BILITOT 0.8 0.5  PROT 6.3* 5.7*  ALBUMIN 3.1* 2.7*   Recent Labs  Lab 05/03/22 1040  LIPASE 31   No results for input(s): "AMMONIA" in the last 168 hours. CBC: Recent Labs  Lab 05/03/22 1040 05/04/22 0200 05/04/22 0548  WBC 5.2 5.8 4.2  NEUTROABS 3.4  --  2.7  HGB 10.1* 9.3* 9.0*  HCT 27.3* 25.3* 24.5*  MCV 88.6 89.1 89.7  PLT 188 167 175   Cardiac Enzymes: No results for input(s): "CKTOTAL", "CKMB", "CKMBINDEX", "TROPONINI" in the last 168 hours. BNP: Invalid input(s): "POCBNP" CBG: Recent Labs  Lab 05/06/22 1137 05/06/22 1551 05/06/22 1924 05/06/22 2315 05/07/22 0326  GLUCAP 142* 109* 150* 104* 97   D-Dimer No results for input(s): "DDIMER" in the last 72 hours. Hgb A1c No results for input(s): "HGBA1C" in the last 72 hours. Lipid Profile No results for input(s): "CHOL", "HDL", "LDLCALC", "TRIG", "CHOLHDL", "LDLDIRECT" in the last 72 hours. Thyroid function studies No results for input(s): "TSH", "T4TOTAL", "T3FREE", "THYROIDAB" in the last 72 hours.  Invalid input(s): "FREET3" Anemia work up No results for input(s): "VITAMINB12", "FOLATE", "FERRITIN", "TIBC", "IRON", "RETICCTPCT" in the last 72 hours. Urinalysis    Component Value Date/Time   COLORURINE YELLOW 05/03/2022 0945   APPEARANCEUR CLEAR 05/03/2022 0945   LABSPEC 1.020 05/03/2022 0945  PHURINE 6.0 05/03/2022 0945   GLUCOSEU NEGATIVE 05/03/2022 0945   HGBUR SMALL (A) 05/03/2022 0945    BILIRUBINUR NEGATIVE 05/03/2022 0945   KETONESUR 5 (A) 05/03/2022 0945   PROTEINUR NEGATIVE 05/03/2022 0945   UROBILINOGEN 1.0 09/07/2010 1440   NITRITE NEGATIVE 05/03/2022 0945   LEUKOCYTESUR MODERATE (A) 05/03/2022 0945   Sepsis Labs Recent Labs  Lab 05/03/22 1040 05/04/22 0200 05/04/22 0548  WBC 5.2 5.8 4.2   Microbiology Recent Results (from the past 240 hour(s))  Resp panel by RT-PCR (RSV, Flu A&B, Covid) Peripheral     Status: None   Collection Time: 05/03/22  9:50 AM   Specimen: Peripheral; Nasal Swab  Result Value Ref Range Status   SARS Coronavirus 2 by RT PCR NEGATIVE NEGATIVE Final    Comment: (NOTE) SARS-CoV-2 target nucleic acids are NOT DETECTED.  The SARS-CoV-2 RNA is generally detectable in upper respiratory specimens during the acute phase of infection. The lowest concentration of SARS-CoV-2 viral copies this assay can detect is 138 copies/mL. A negative result does not preclude SARS-Cov-2 infection and should not be used as the sole basis for treatment or other patient management decisions. A negative result may occur with  improper specimen collection/handling, submission of specimen other than nasopharyngeal swab, presence of viral mutation(s) within the areas targeted by this assay, and inadequate number of viral copies(<138 copies/mL). A negative result must be combined with clinical observations, patient history, and epidemiological information. The expected result is Negative.  Fact Sheet for Patients:  EntrepreneurPulse.com.au  Fact Sheet for Healthcare Providers:  IncredibleEmployment.be  This test is no t yet approved or cleared by the [place] FDA and  has been authorized for detection and/or diagnosis of SARS-CoV-2 by FDA under an Emergency Use Authorization (EUA). This EUA will remain  in effect (meaning this test can be used) for the duration of the COVID-19 declaration under Section 564(b)(1) of  the Act, 21 U.S.C.section 360bbb-3(b)(1), unless the authorization is terminated  or revoked sooner.       Influenza A by PCR NEGATIVE NEGATIVE Final   Influenza B by PCR NEGATIVE NEGATIVE Final    Comment: (NOTE) The Xpert Xpress SARS-CoV-2/FLU/RSV plus assay is intended as an aid in the diagnosis of influenza from Nasopharyngeal swab specimens and should not be used as a sole basis for treatment. Nasal washings and aspirates are unacceptable for Xpert Xpress SARS-CoV-2/FLU/RSV testing.  Fact Sheet for Patients: EntrepreneurPulse.com.au  Fact Sheet for Healthcare Providers: IncredibleEmployment.be  This test is not yet approved or cleared by the [place] FDA and has been authorized for detection and/or diagnosis of SARS-CoV-2 by FDA under an Emergency Use Authorization (EUA). This EUA will remain in effect (meaning this test can be used) for the duration of the COVID-19 declaration under Section 564(b)(1) of the Act, 21 U.S.C. section 360bbb-3(b)(1), unless the authorization is terminated or revoked.     Resp Syncytial Virus by PCR NEGATIVE NEGATIVE Final    Comment: (NOTE) Fact Sheet for Patients: EntrepreneurPulse.com.au  Fact Sheet for Healthcare Providers: IncredibleEmployment.be  This test is not yet approved or cleared by the [place] FDA and has been authorized for detection and/or diagnosis of SARS-CoV-2 by FDA under an Emergency Use Authorization (EUA). This EUA will remain in effect (meaning this test can be used) for the duration of the COVID-19 declaration under Section 564(b)(1) of the Act, 21 U.S.C. section 360bbb-3(b)(1), unless the authorization is terminated or revoked.  Performed at [facility], [place] [address] [place],  [place] [address]   Blood culture (routine x 2)     Status: None (Preliminary result)   Collection Time: 05/03/22 10:40 AM    Specimen: BLOOD  Result Value Ref Range Status   Specimen Description   Final    BLOOD SITE NOT SPECIFIED Performed at [place] [address] [place], [place] [address]    Special Requests   Final    BOTTLES DRAWN AEROBIC AND ANAEROBIC Blood Culture adequate volume Performed at [place] [address], [place], [place] [address]    Culture   Final    NO GROWTH 4 DAYS Performed at [facility], [place] [address] [place], [place] [address]    Report Status PENDING  Incomplete  Blood culture (routine x 2)     Status: None (Preliminary result)   Collection Time: 05/03/22 10:46 AM   Specimen: BLOOD LEFT ARM  Result Value Ref Range Status   Specimen Description   Final    BLOOD LEFT ARM Performed at [place] [address], [place], [place] [address]    Special Requests   Final    BOTTLES DRAWN AEROBIC AND ANAEROBIC Blood Culture adequate volume Performed at [place] [address], [place], [place] [address]    Culture   Final    NO GROWTH 4 DAYS Performed at [facility], [place] [address] [place], [place] [address]    Report Status PENDING  Incomplete  Urine Culture     Status: Abnormal   Collection Time: 05/03/22 12:37 PM   Specimen: Urine, Clean Catch  Result Value Ref Range Status   Specimen Description   Final    URINE, CLEAN CATCH Performed at [facility], [place] [address] [place], [place] [address]    Special Requests   Final    NONE Performed at [facility], [place] [address] [place], [place] [address]    Culture >=100,000 COLONIES/mL KLEBSIELLA PNEUMONIAE (A)  Final   Report Status 05/05/2022 FINAL  Final   Organism ID, Bacteria KLEBSIELLA PNEUMONIAE (A)  Final      Susceptibility   Klebsiella pneumoniae - MIC*    AMPICILLIN >=32 RESISTANT Resistant     CEFAZOLIN <=4 SENSITIVE Sensitive     CEFEPIME <=0.12 SENSITIVE  Sensitive     CEFTRIAXONE <=0.25 SENSITIVE Sensitive     CIPROFLOXACIN <=0.25 SENSITIVE Sensitive     GENTAMICIN <=1 SENSITIVE Sensitive     IMIPENEM <=0.25 SENSITIVE Sensitive     NITROFURANTOIN 64 INTERMEDIATE Intermediate     TRIMETH/SULFA <=20 SENSITIVE Sensitive     AMPICILLIN/SULBACTAM 4 SENSITIVE Sensitive     PIP/TAZO <=4 SENSITIVE Sensitive     * >=100,000 COLONIES/mL KLEBSIELLA PNEUMONIAE    SIGNED:   Charlynne Cousins, MD  [facility] 05/07/2022, 8:13 AM Pager   If 7PM-7AM, please contact night-coverage [email or web address] Password [email or web address]

## 2022-05-07 NOTE — TOC Transition Note (Signed)
Transition of Care [facility]) - CM/SW Discharge Note  Patient Details  Name: Clarence Andrade MRN: [id number] Date of Birth: [birth year]  [facility] CM/SW Contact:  Sherie Don, LCSW Phone Number: 05/07/2022, 1:37 PM  Clinical Narrative: PT evaluation recommended HHPT. [address] orders have been placed for PT and RN. CSW spoke with wife regarding recommendation and she is agreeable to [facility] CSW made referrals to the following agencies:  [facility]: declined [facility]: declined [facility]: [facility]: no response [facility]: accepted  CSW left VM for wife regarding HH being set up. TOC signing off.    Final next level of care: Home w Home Health Services Barriers to Discharge: Barriers Resolved  Patient Goals and CMS Choice CMS Medicare.gov Compare Post Acute Care list provided to:: Patient Represenative (must comment) Choice offered to / list presented to : Spouse  Discharge Plan and Services Additional resources added to the After Visit Summary for   Discharge Planning Services: CM Consult    DME Arranged: N/A DME Agency: NA HH Arranged: PT, RN [place] Agency: [place] Date [facility] Agency Contacted: 05/07/22 Representative spoke with at [place]: [place] Determinants of Health ([place]) Interventions [place]: No Food Insecurity (05/04/2022)  Housing: Low Risk  (05/04/2022)  Transportation Needs: No Transportation Needs (05/04/2022)  Utilities: Not At Risk (05/04/2022)  Financial Resource Strain: Low Risk  (02/24/2022)  Tobacco Use: Medium Risk (05/06/2022)   Readmission Risk Interventions     No data to display

## 2022-05-08 LAB — CULTURE, BLOOD (ROUTINE X 2)
Culture: NO GROWTH
Culture: NO GROWTH
Special Requests: ADEQUATE
Special Requests: ADEQUATE

## 2022-05-11 ENCOUNTER — Inpatient Hospital Stay: Payer: PPO

## 2022-05-11 ENCOUNTER — Other Ambulatory Visit: Payer: Self-pay | Admitting: *Deleted

## 2022-05-11 ENCOUNTER — Inpatient Hospital Stay: Payer: PPO | Attending: Hematology and Oncology

## 2022-05-11 ENCOUNTER — Inpatient Hospital Stay (HOSPITAL_BASED_OUTPATIENT_CLINIC_OR_DEPARTMENT_OTHER): Payer: PPO | Admitting: Hematology and Oncology

## 2022-05-11 VITALS — BP 105/63 | HR 75 | Temp 97.7°F | Resp 16 | Ht 72.0 in | Wt 266.9 lb

## 2022-05-11 DIAGNOSIS — Z95828 Presence of other vascular implants and grafts: Secondary | ICD-10-CM

## 2022-05-11 DIAGNOSIS — I082 Rheumatic disorders of both aortic and tricuspid valves: Secondary | ICD-10-CM | POA: Diagnosis not present

## 2022-05-11 DIAGNOSIS — C01 Malignant neoplasm of base of tongue: Secondary | ICD-10-CM

## 2022-05-11 DIAGNOSIS — C109 Malignant neoplasm of oropharynx, unspecified: Secondary | ICD-10-CM | POA: Diagnosis not present

## 2022-05-11 DIAGNOSIS — D631 Anemia in chronic kidney disease: Secondary | ICD-10-CM | POA: Diagnosis not present

## 2022-05-11 DIAGNOSIS — E440 Moderate protein-calorie malnutrition: Secondary | ICD-10-CM | POA: Diagnosis not present

## 2022-05-11 DIAGNOSIS — Z9181 History of falling: Secondary | ICD-10-CM | POA: Diagnosis not present

## 2022-05-11 DIAGNOSIS — M47812 Spondylosis without myelopathy or radiculopathy, cervical region: Secondary | ICD-10-CM | POA: Insufficient documentation

## 2022-05-11 DIAGNOSIS — I1310 Hypertensive heart and chronic kidney disease without heart failure, with stage 1 through stage 4 chronic kidney disease, or unspecified chronic kidney disease: Secondary | ICD-10-CM | POA: Diagnosis not present

## 2022-05-11 DIAGNOSIS — Z87891 Personal history of nicotine dependence: Secondary | ICD-10-CM | POA: Diagnosis not present

## 2022-05-11 DIAGNOSIS — N390 Urinary tract infection, site not specified: Secondary | ICD-10-CM | POA: Diagnosis not present

## 2022-05-11 DIAGNOSIS — I77819 Aortic ectasia, unspecified site: Secondary | ICD-10-CM | POA: Diagnosis not present

## 2022-05-11 DIAGNOSIS — I252 Old myocardial infarction: Secondary | ICD-10-CM | POA: Diagnosis not present

## 2022-05-11 DIAGNOSIS — Z79899 Other long term (current) drug therapy: Secondary | ICD-10-CM | POA: Diagnosis not present

## 2022-05-11 DIAGNOSIS — N189 Chronic kidney disease, unspecified: Secondary | ICD-10-CM | POA: Diagnosis not present

## 2022-05-11 DIAGNOSIS — I2510 Atherosclerotic heart disease of native coronary artery without angina pectoris: Secondary | ICD-10-CM | POA: Diagnosis not present

## 2022-05-11 DIAGNOSIS — Z792 Long term (current) use of antibiotics: Secondary | ICD-10-CM | POA: Diagnosis not present

## 2022-05-11 DIAGNOSIS — N400 Enlarged prostate without lower urinary tract symptoms: Secondary | ICD-10-CM | POA: Diagnosis not present

## 2022-05-11 DIAGNOSIS — E876 Hypokalemia: Secondary | ICD-10-CM | POA: Diagnosis not present

## 2022-05-11 DIAGNOSIS — D630 Anemia in neoplastic disease: Secondary | ICD-10-CM | POA: Diagnosis not present

## 2022-05-11 DIAGNOSIS — Z7982 Long term (current) use of aspirin: Secondary | ICD-10-CM | POA: Diagnosis not present

## 2022-05-11 DIAGNOSIS — E871 Hypo-osmolality and hyponatremia: Secondary | ICD-10-CM | POA: Diagnosis not present

## 2022-05-11 DIAGNOSIS — B9689 Other specified bacterial agents as the cause of diseases classified elsewhere: Secondary | ICD-10-CM | POA: Diagnosis not present

## 2022-05-11 LAB — CBC WITH DIFFERENTIAL (CANCER CENTER ONLY)
Abs Immature Granulocytes: 0.06 10*3/uL (ref 0.00–0.07)
Basophils Absolute: 0 10*3/uL (ref 0.0–0.1)
Basophils Relative: 0 %
Eosinophils Absolute: 0.1 10*3/uL (ref 0.0–0.5)
Eosinophils Relative: 1 %
HCT: 26.4 % — ABNORMAL LOW (ref 39.0–52.0)
Hemoglobin: 9.2 g/dL — ABNORMAL LOW (ref 13.0–17.0)
Immature Granulocytes: 1 %
Lymphocytes Relative: 11 %
Lymphs Abs: 0.7 10*3/uL (ref 0.7–4.0)
MCH: 33.1 pg (ref 26.0–34.0)
MCHC: 34.8 g/dL (ref 30.0–36.0)
MCV: 95 fL (ref 80.0–100.0)
Monocytes Absolute: 1 10*3/uL (ref 0.1–1.0)
Monocytes Relative: 16 %
Neutro Abs: 4.3 10*3/uL (ref 1.7–7.7)
Neutrophils Relative %: 71 %
Platelet Count: 212 10*3/uL (ref 150–400)
RBC: 2.78 MIL/uL — ABNORMAL LOW (ref 4.22–5.81)
RDW: 18.8 % — ABNORMAL HIGH (ref 11.5–15.5)
WBC Count: 6.2 10*3/uL (ref 4.0–10.5)
nRBC: 0 % (ref 0.0–0.2)

## 2022-05-11 LAB — BASIC METABOLIC PANEL
Anion gap: 7 (ref 5–15)
BUN: 25 mg/dL — ABNORMAL HIGH (ref 8–23)
CO2: 31 mmol/L (ref 22–32)
Calcium: 8.6 mg/dL — ABNORMAL LOW (ref 8.9–10.3)
Chloride: 89 mmol/L — ABNORMAL LOW (ref 98–111)
Creatinine, Ser: 0.62 mg/dL (ref 0.61–1.24)
GFR, Estimated: >60 mL/min (ref >60)
Glucose, Bld: 112 mg/dL — ABNORMAL HIGH (ref 70–99)
Potassium: 4.7 mmol/L (ref 3.5–5.1)
Sodium: 127 mmol/L — ABNORMAL LOW (ref 135–145)

## 2022-05-11 LAB — SODIUM, URINE, RANDOM: Sodium, Ur: 57 mmol/L

## 2022-05-11 LAB — MAGNESIUM: Magnesium: 1.6 mg/dL — ABNORMAL LOW (ref 1.7–2.4)

## 2022-05-11 MED ORDER — SODIUM CHLORIDE 0.9% FLUSH
10.0000 mL | Freq: Once | INTRAVENOUS | Status: AC
  Administered 2022-05-11: 10 mL

## 2022-05-11 MED ORDER — HEPARIN SOD (PORK) LOCK FLUSH 100 UNIT/ML IV SOLN
500.0000 [IU] | Freq: Once | INTRAVENOUS | Status: AC
  Administered 2022-05-11: 500 [IU]

## 2022-05-11 MED ORDER — MAGNESIUM SULFATE 2 GM/50ML IV SOLN: 2.0000 g | Freq: Once | INTRAVENOUS | Status: DC

## 2022-05-11 NOTE — Progress Notes (Unsigned)
[place] NOTE  Patient Care Team: Kathalene Frames, MD as PCP - General (Internal Medicine) Sueanne Margarita, MD as PCP - Cardiology (Cardiology) Constance Haw, MD as PCP - Electrophysiology (Cardiology) Malmfelt, Stephani Police, RN as Oncology Nurse Navigator Benay Pike, MD as Consulting Physician (Hematology and Oncology) [name], MD as Consulting Physician (Radiation Oncology)  CHIEF COMPLAINTS/PURPOSE OF CONSULTATION:  Squamous cell carcinoma of the oropharynx  ASSESSMENT & PLAN:  No problem-specific Assessment & Plan notes found for this encounter.  Severe hypomagnesemia likely from renal wasting secondary to cisplatin.  We will replace 4 g of magnesium No orders of the defined types were placed in this encounter.  HISTORY OF PRESENTING ILLNESS:   Clarence Andrade 77 y.o. male is here because of SCC oropharynx.  Oncologic History  This is a very pleasant 77 year old male patient who was seen by ENT with chief complaint of lump in his neck ongoing for the past 1 to 2 months.  Around July he suddenly noticed the lump in the neck.  He was initially treated by antibiotics but this has not changed.  He also has started noticing some difficulty swallowing and some dysgeusia.  He tells me that he has lost about 20 pounds of weight in the past 2 to 3 months but part of it could be intentional.  He was seen by Dr. Sabino Gasser on September 20 for an initial consultation and he had flexible fiberoptic endoscopy the same day which noticed large base of tongue tumor.  This tumor measured at least 2 to 3 cm in diameter extending across midline.  He also had a CT on September 27 which confirmed 4 cm mainly exophytic mass centered at the left base of the tongue with at least 1 ipsilateral malignant node measuring nearly 4 cm with signs of extracapsular extension.  Findings consistent with metastatic squamous cell carcinoma.  PET/CT done on October 13 with no  findings of metastatic disease, large left-sided hypermetabolic oropharyngeal neoplasm and adjacent left-sided metastatic lymphadenopathy.  Interval history  Patient is here for follow-up after 6 cycle of cisplatin.   He is here with his wife.  He feels exhausted, has no energy whatsoever.  He continues to lose weight.  He is using the G-tube, cannot swallow anything by mouth.  He is still reluctant to take any medication for the sore throat, he has no pain if he is not trying to swallow.  Denies any change in hearing, neuropathy.  He says he will be thankful if we can omit last cycle of chemotherapy.  Rest of the pertinent 10 point ROS reviewed and negative   REVIEW OF SYSTEMS:   Constitutional: Denies fevers, chills or abnormal night sweats Eyes: Denies blurriness of vision, double vision or watery eyes Ears, nose, mouth, throat, and face: Denies mucositis or sore throat Respiratory: Denies cough, dyspnea or wheezes Cardiovascular: Denies palpitation, chest discomfort or lower extremity swelling Gastrointestinal:  Denies nausea, heartburn or change in bowel habits Skin: Denies abnormal skin rashes Lymphatics: Denies new lymphadenopathy or easy bruising Neurological:Denies numbness, tingling or new weaknesses Behavioral/Psych: Mood is stable, no new changes  All other systems were reviewed with the patient and are negative.  MEDICAL HISTORY:  Past Medical History:  Diagnosis Date   Abdominal hernia    Midline   Adrenal incidentaloma ([place]) 2010   Aortic stenosis    Ascending aortic aneurysm ([place])    a. 4.4cm by imaging 03/2020.   CAD (coronary artery disease)  a. Cath 03/2018 - CTO of the prox-mid LAD with bridging, L-L and R-L collaterals, moderate, non-obstructive disease involving the proximal LAD, proximal LCx, and distal RCA, more severe disease noted involving small branch vessels (RV marginal and superior branch of OM3).   Complete heart block (HCC)    Gallstones    Heart  murmur    History of pacemaker    a. 11/2018 - Medtronic, intermittent CHB.   Hyperlipidemia    Morbid obesity (HCC)    Myocardial infarction [facility])    NSVT (nonsustained ventricular tachycardia) (HCC)    OSA (obstructive sleep apnea)    Severe PSG 1/07 AHI 66/hr, O2 Nadir 50% Refuses treatment - Pt does not believe test was accurate   Pericarditis    Pneumonia    Pulmonary HTN (HCC)    mild with PASP 54mHg by echo 02/2018, not seen on 03/2020 echo   Pulmonary nodule    Screening for AAA (abdominal aortic aneurysm) 2010   CT Abd/Pelvis   Ventral hernia    03/2009    SURGICAL HISTORY: Past Surgical History:  Procedure Laterality Date   CHOLECYSTECTOMY N/A 10/11/2019   Procedure: LAPAROSCOPIC CHOLECYSTECTOMY WITH INTRAOPERATIVE CHOLANGIOGRAM;  Surgeon: GArmandina Gemma MD;  Location: [facility];  Service: General;  Laterality: N/A;   CORONARY ANGIOGRAPHY N/A 03/23/2018   Procedure: CORONARY ANGIOGRAPHY;  Surgeon: ENelva Bush MD;  [facility];  Service: Cardiovascular;  Laterality: N/A;   HERNIA REPAIR  05/2002   three   IR GASTROSTOMY TUBE MOD SED  03/08/2022   IR GJ TUBE CHANGE  04/15/2022   IR IMAGING GUIDED PORT INSERTION  03/08/2022   KNEE ARTHROSCOPY Left 09/2002   PACEMAKER IMPLANT N/A 11/08/2018   Procedure: PACEMAKER IMPLANT;  Surgeon: CConstance Haw MD;  [facility];  Service: Cardiovascular;  Laterality: N/A;   SHOULDER SURGERY Right 2020   TOTAL KNEE ARTHROPLASTY Bilateral 09/2010   VENTRAL HERNIA REPAIR  03/2009    SOCIAL HISTORY: Social History   Socioeconomic History   Marital status: Married    Spouse name: Not on file   Number of children: Not on file   Years of education: Not on file   Highest education level: Not on file  Occupational History   Not on file  Tobacco Use   Smoking status: Former    Packs/day: 0.50    Types: Cigarettes    Quit date: 160   Years since quitting: 31.0   Smokeless tobacco: Never   Vaping Use   Vaping Use: Never used  Substance and Sexual Activity   Alcohol use: No   Drug use: No   Sexual activity: Not Currently  Other Topics Concern   Not on file  Social History Narrative   Not on file   Social Determinants of Health   Financial Resource Strain: Low Risk  (02/24/2022)   Overall Financial Resource Strain (CARDIA)    Difficulty of Paying Living Expenses: Not hard at all  Food Insecurity: No Food Insecurity (05/04/2022)   Hunger Vital Sign    Worried About Running Out of Food in the Last Year: Never true    RImperialin the Last Year: Never true  Transportation Needs: No Transportation Needs (05/04/2022)   PRAPARE - THydrologist(Medical): No    Lack of Transportation (Non-Medical): No  Physical Activity: Not on file  Stress: Not on file  Social Connections: Not on file  Intimate Partner Violence:  Not At Risk (05/04/2022)   Humiliation, Afraid, Rape, and Kick questionnaire    Fear of Current or Ex-Partner: No    Emotionally Abused: No    Physically Abused: No    Sexually Abused: No    FAMILY HISTORY: Family History  Problem Relation Age of Onset   Heart attack Father    Heart disease Father    Aneurysm Father    Breast cancer Sister    Colon cancer Sister     ALLERGIES:  is allergic to sertraline hcl and transderm-scop [scopolamine].  MEDICATIONS:  Current Outpatient Medications  Medication Sig Dispense Refill   acetaminophen (TYLENOL) 500 MG tablet Place 1,000 mg into feeding tube every 6 (six) hours as needed (for pain).     aspirin EC 81 MG tablet 81 mg See admin instructions. 81 mg, per tube, once a day     buPROPion (WELLBUTRIN XL) 150 MG 24 hr tablet 150 mg See admin instructions. 150 mg, per tube, every morning     cephALEXin (KEFLEX) 500 MG capsule Take 1 capsule (500 mg total) by mouth every 8 (eight) hours for 4 days. 12 capsule 0   COVID-19 mRNA bivalent vaccine, Pfizer, (PFIZER COVID-19 VAC  BIVALENT) injection Inject into the muscle. (Patient not taking: Reported on 05/03/2022) 0.3 mL 0   COVID-19 mRNA Vac-TriS, Pfizer, (PFIZER-BIONT COVID-19 VAC-TRIS) SUSP injection Inject into the muscle. (Patient not taking: Reported on 05/03/2022) 0.3 mL 0   COVID-19 mRNA vaccine 2023-2024 (COMIRNATY) syringe Inject into the muscle. (Patient not taking: Reported on 05/03/2022) 0.3 mL 0   feeding supplement, ENSURE COMPLETE, (ENSURE COMPLETE) LIQD Place 237 mLs into feeding tube See admin instructions. Mix 237 ml's of Ensure Complete with 237 ml's of milk = 3 feedings a day     influenza vaccine adjuvanted (FLUAD) 0.5 ML injection Inject into the muscle. (Patient not taking: Reported on 05/03/2022) 0.5 mL 0   metoprolol succinate (TOPROL-XL) 25 MG 24 hr tablet Take 0.5 tablets (12.5 mg total) by mouth daily. (Patient not taking: Reported on 05/03/2022) 90 tablet 3   nitroGLYCERIN (NITROSTAT) 0.4 MG SL tablet Place 1 tablet (0.4 mg total) under the tongue every 5 (five) minutes as needed for chest pain. 25 tablet 3   Nutritional Supplements (ISOSOURCE) LIQD Place into feeding tube See admin instructions. Three feedings a day     Protein (FEEDING SUPPLEMENT, PROSOURCE TF20,) liquid Place 60 mLs into feeding tube daily. (Patient not taking: Reported on 05/03/2022)     rosuvastatin (CRESTOR) 10 MG tablet TAKE 1 TABLET ONCE DAILY. (Patient taking differently: Place 10 mg into feeding tube daily.) 90 tablet 0   RSV vaccine recomb adjuvanted (AREXVY) 120 MCG/0.5ML injection Inject into the muscle. (Patient not taking: Reported on 05/03/2022) 0.5 mL 0   scopolamine (TRANSDERM-SCOP) 1 MG/3DAYS Place 1 patch (1.5 mg total) onto the skin every 3 (three) days. (Patient not taking: Reported on 05/03/2022) 10 patch 2   sodium chloride 1 g tablet Take 2 tablets (2 g total) by mouth 2 (two) times daily with a meal. 30 tablet 3   tamsulosin (FLOMAX) 0.4 MG CAPS capsule Take 0.4 mg by mouth daily. (Patient not taking:  Reported on 05/03/2022)     temazepam (RESTORIL) 15 MG capsule Take 1 capsule (15 mg total) by mouth at bedtime as needed for sleep. (Patient taking differently: Place 15 mg into feeding tube at bedtime.) 30 capsule 0   Water For Irrigation, Sterile (FREE WATER) SOLN Place 200 mLs into feeding tube every  8 (eight) hours.     No current facility-administered medications for this visit.     PHYSICAL EXAMINATION: ECOG PERFORMANCE STATUS: 0 - Asymptomatic  Vitals:   05/11/22 1434  BP: 105/63  Pulse: 75  Resp: 16  Temp: 97.7 F (36.5 C)  SpO2: 98%   Filed Weights   05/11/22 1434  Weight: 266 lb 14.4 oz (121.1 kg)    Physical Exam Constitutional:      General: He is not in acute distress.    Appearance: Normal appearance.  HENT:     Mouth/Throat:     Mouth: Mucous membranes are moist.     Pharynx: Posterior oropharyngeal erythema (mild mucositis) present.  Cardiovascular:     Rate and Rhythm: Normal rate and regular rhythm.     Pulses: Normal pulses.     Heart sounds: Normal heart sounds.  Musculoskeletal:        General: Normal range of motion.     Cervical back: Normal range of motion and neck supple. No rigidity.  Lymphadenopathy:     Cervical: Cervical adenopathy (Cervical lymphadenopathy continues to improve, barely palpable) present.  Skin:    General: Skin is warm and dry.  Neurological:     Mental Status: He is alert.   LABORATORY DATA:  I have reviewed the data as listed Lab Results  Component Value Date   WBC 6.2 05/11/2022   HGB 9.2 (L) 05/11/2022   HCT 26.4 (L) 05/11/2022   MCV 95.0 05/11/2022   PLT 212 05/11/2022     Chemistry      Component Value Date/Time   NA 129 (L) 05/07/2022 0733   NA 140 10/13/2021 0954   K 4.9 05/07/2022 0154   CL 90 (L) 05/07/2022 0154   CO2 33 (H) 05/07/2022 0154   BUN 19 05/07/2022 0154   BUN 20 10/13/2021 0954   CREATININE 0.51 (L) 05/07/2022 0154   CREATININE 0.87 04/19/2022 1414      Component Value Date/Time    CALCIUM 8.3 (L) 05/07/2022 0154   ALKPHOS 56 05/07/2022 0154   AST 28 05/07/2022 0154   ALT 29 05/07/2022 0154   BILITOT 0.5 05/07/2022 0154   BILITOT 0.4 03/30/2021 1119       RADIOGRAPHIC STUDIES: I have personally reviewed the radiological images as listed and agreed with the findings in the report. DG CHEST PORT 1 VIEW  Result Date: 05/05/2022 CLINICAL DATA:  Shortness of breath EXAM: PORTABLE CHEST 1 VIEW COMPARISON:  05/03/2022 FINDINGS: Left pacer and right Port-A-Cath remain in place, unchanged. Heart and mediastinal contours are within normal limits. No focal opacities or effusions. No acute bony abnormality. IMPRESSION: No active cardiopulmonary disease. Electronically Signed   By: Rolm Baptise M.D.   On: 05/05/2022 15:22   ECHOCARDIOGRAM COMPLETE  Result Date: 05/04/2022    ECHOCARDIOGRAM REPORT   Patient Name:   LEOTHA VOELTZ Date of Exam: 05/04/2022 Medical Rec #:  [id number]       Height:       72.0 in Accession #:    [id number]      Weight:       268.0 lb Date of Birth:  27-Jan-1946       BSA:          2.413 m Patient Age:    29 years        BP:           131/96 mmHg Patient Gender: M  HR:           99 bpm. Exam Location:  Inpatient Procedure: 2D Echo and Intracardiac Opacification Agent Indications:    CHF  History:        Patient has prior history of Echocardiogram examinations, most                 recent 03/10/2020. Signs/Symptoms:Murmur.  Sonographer:    Harvie Junior Referring Phys: [id number] FANG XU  Sonographer Comments: Technically difficult study due to poor echo windows and patient is obese. Image acquisition challenging due to patient body habitus and Image acquisition challenging due to respiratory motion. IMPRESSIONS  1. Limited study due to poor echo windows.  2. Left ventricular ejection fraction, by estimation, is 55 to 60%. The left ventricle has normal function. The left ventricle has no regional wall motion abnormalities. There is mild concentric  left ventricular hypertrophy. Left ventricular diastolic parameters are indeterminate.  3. Right ventricular systolic function is normal. The right ventricular size is not well visualized. There is normal pulmonary artery systolic pressure.  4. The mitral valve is grossly normal. Trivial mitral valve regurgitation. No evidence of mitral stenosis.  5. There is at least mild aortic stenosis and mean gradient may be underestimated due to suboptimal doppler angle of interrogation for both LVOT VTI and AoV VTI. The aortic valve is calcified. Aortic valve regurgitation is not visualized. There is at least mild aortic stenosis. Aortic valve mean gradient measures 15.7 mmHg. Aortic valve Vmax measures 2.53 m/s.  6. Aortic dilatation noted. There is borderline dilatation of the aortic root, measuring 40 mm. Comparison(s): Compared to prior TTE in 2021, there continues to be at least mild AS (prior mean gradient 30mHg). Otherwise, there is no significant change. FINDINGS  Left Ventricle: Left ventricular ejection fraction, by estimation, is 55 to 60%. The left ventricle has normal function. The left ventricle has no regional wall motion abnormalities. The left ventricular internal cavity size was normal in size. There is  mild concentric left ventricular hypertrophy. Left ventricular diastolic parameters are indeterminate. Right Ventricle: The right ventricular size is not well visualized. Right vetricular wall thickness was not well visualized. Right ventricular systolic function is normal. There is normal pulmonary artery systolic pressure. The tricuspid regurgitant velocity is 2.77 m/s, and with an assumed right atrial pressure of 3 mmHg, the estimated right ventricular systolic pressure is 317.5mmHg. Left Atrium: Left atrial size was not well visualized. Right Atrium: Right atrial size was not well visualized. Pericardium: There is no evidence of pericardial effusion. Mitral Valve: The mitral valve is grossly normal.  There is mild thickening of the mitral valve leaflet(s). There is mild calcification of the mitral valve leaflet(s). Trivial mitral valve regurgitation. No evidence of mitral valve stenosis. Tricuspid Valve: The tricuspid valve is grossly normal. Tricuspid valve regurgitation is trivial. Aortic Valve: There is at least mild aortic stenosis and mean gradient may be underestimated due to suboptimal doppler angle of interrogation for both LVOT VTI and AoV VTI. The aortic valve is calcified. Aortic valve regurgitation is not visualized. There is at least mild aortic stenosis. Aortic valve mean gradient measures 15.7 mmHg. Aortic valve peak gradient measures 25.6 mmHg. Aortic valve area, by VTI measures 1.74 cm. Pulmonic Valve: The pulmonic valve was not well visualized. Aorta: Aortic dilatation noted. There is borderline dilatation of the aortic root, measuring 40 mm. IAS/Shunts: The atrial septum is grossly normal.  LEFT VENTRICLE PLAX 2D LVIDd:  4.20 cm      Diastology LVIDs:         2.60 cm      LV e' medial:    9.90 cm/s LV PW:         1.30 cm      LV E/e' medial:  8.7 LV IVS:        1.30 cm      LV e' lateral:   11.40 cm/s LVOT diam:     2.30 cm      LV E/e' lateral: 7.6 LV SV:         66 LV SV Index:   28 LVOT Area:     4.15 cm  LV Volumes (MOD) LV vol d, MOD A4C: 104.0 ml LV vol s, MOD A4C: 50.1 ml LV SV MOD A4C:     104.0 ml RIGHT VENTRICLE RV S prime:     15.10 cm/s TAPSE (M-mode): 2.1 cm LEFT ATRIUM             Index        RIGHT ATRIUM           Index LA Vol (A2C):   57.2 ml 23.71 ml/m  RA Area:     17.40 cm LA Vol (A4C):   62.0 ml 25.70 ml/m  RA Volume:   47.50 ml  19.69 ml/m LA Biplane Vol: 60.4 ml 25.03 ml/m  AORTIC VALVE                     PULMONIC VALVE AV Area (Vmax):    1.44 cm      PV Vmax:       1.37 m/s AV Area (Vmean):   1.79 cm      PV Peak grad:  7.5 mmHg AV Area (VTI):     1.74 cm AV Vmax:           252.80 cm/s AV Vmean:          163.667 cm/s AV VTI:            0.382 m AV Peak  Grad:      25.6 mmHg AV Mean Grad:      15.7 mmHg LVOT Vmax:         87.50 cm/s LVOT Vmean:        70.500 cm/s LVOT VTI:          0.160 m LVOT/AV VTI ratio: 0.42  AORTA Ao Root diam: 3.60 cm MITRAL VALVE               TRICUSPID VALVE MV Area (PHT): 6.17 cm    TR Peak grad:   30.7 mmHg MV Decel Time: 123 msec    TR Vmax:        277.00 cm/s MR Peak grad: 76.2 mmHg MR Vmax:      436.33 cm/s  SHUNTS MV E velocity: 86.40 cm/s  Systemic VTI:  0.16 m MV A velocity: 50.30 cm/s  Systemic Diam: 2.30 cm MV E/A ratio:  1.72 Gwyndolyn Kaufman MD Electronically signed by Gwyndolyn Kaufman MD Signature Date/Time: 05/04/2022/4:45:28 PM    Final    VAS [place] LOWER EXTREMITY VENOUS (DVT)  Result Date: 05/03/2022  Lower Venous DVT Study Patient Name:  DAYVON DAX  Date of Exam:   05/03/2022 Medical Rec #: [id number]        Accession #:    [id number] Date of Birth: 1945-08-31        Patient Gender: M Patient  Age:   62 years Exam Location:  [facility] Procedure:      VAS [place] LOWER EXTREMITY VENOUS (DVT) Referring Phys: Annamaria Boots XU --------------------------------------------------------------------------------  Indications: Edema.  Risk Factors: Cancer. Limitations: Poor ultrasound/tissue interface. Comparison Study: No prior studies. Performing Technologist: Oliver Hum RVT  Examination Guidelines: A complete evaluation includes B-mode imaging, spectral Doppler, color Doppler, and power Doppler as needed of all accessible portions of each vessel. Bilateral testing is considered an integral part of a complete examination. Limited examinations for reoccurring indications may be performed as noted. The reflux portion of the exam is performed with the patient in reverse Trendelenburg.  +---------+---------------+---------+-----------+----------+--------------+ RIGHT    CompressibilityPhasicitySpontaneityPropertiesThrombus Aging +---------+---------------+---------+-----------+----------+--------------+ CFV      Full            Yes      Yes                                 +---------+---------------+---------+-----------+----------+--------------+ SFJ      Full                                                        +---------+---------------+---------+-----------+----------+--------------+ FV Prox  Full                                                        +---------+---------------+---------+-----------+----------+--------------+ FV Mid   Full                                                        +---------+---------------+---------+-----------+----------+--------------+ FV DistalFull                                                        +---------+---------------+---------+-----------+----------+--------------+ PFV      Full                                                        +---------+---------------+---------+-----------+----------+--------------+ POP      Full           Yes      Yes                                 +---------+---------------+---------+-----------+----------+--------------+ PTV      Full                                                        +---------+---------------+---------+-----------+----------+--------------+  PERO     Full                                                        +---------+---------------+---------+-----------+----------+--------------+   +---------+---------------+---------+-----------+----------+-------------------+ LEFT     CompressibilityPhasicitySpontaneityPropertiesThrombus Aging      +---------+---------------+---------+-----------+----------+-------------------+ CFV      Full           Yes      Yes                                      +---------+---------------+---------+-----------+----------+-------------------+ SFJ      Full                                                             +---------+---------------+---------+-----------+----------+-------------------+ FV Prox  Full                                                              +---------+---------------+---------+-----------+----------+-------------------+ FV Mid   Full                                                             +---------+---------------+---------+-----------+----------+-------------------+ FV DistalFull                                                             +---------+---------------+---------+-----------+----------+-------------------+ PFV      Full                                                             +---------+---------------+---------+-----------+----------+-------------------+ POP      Full           Yes      Yes                                      +---------+---------------+---------+-----------+----------+-------------------+ PTV      Full                                                             +---------+---------------+---------+-----------+----------+-------------------+  PERO                                                  Not well visualized +---------+---------------+---------+-----------+----------+-------------------+     Summary: RIGHT: - There is no evidence of deep vein thrombosis in the lower extremity. However, portions of this examination were limited- see technologist comments above.  - No cystic structure found in the popliteal fossa.  LEFT: - There is no evidence of deep vein thrombosis in the lower extremity. However, portions of this examination were limited- see technologist comments above.  - No cystic structure found in the popliteal fossa.  *See table(s) above for measurements and observations. Electronically signed by Jamelle Haring on 05/03/2022 at 4:33:57 PM.    Final    CT Head Wo Contrast  Result Date: 05/03/2022 CLINICAL DATA:  Head trauma, moderate-severe; Neck trauma (Age >= 65y). Multiple recent falls. Currently undergoing treatment for oropharyngeal cancer. EXAM: CT HEAD WITHOUT CONTRAST CT CERVICAL SPINE WITHOUT  CONTRAST TECHNIQUE: Multidetector CT imaging of the head and cervical spine was performed following the standard protocol without intravenous contrast. Multiplanar CT image reconstructions of the cervical spine were also generated. RADIATION DOSE REDUCTION: This exam was performed according to the departmental dose-optimization program which includes automated exposure control, adjustment of the mA and/or kV according to patient size and/or use of iterative reconstruction technique. COMPARISON:  Neck CT 02/02/2022 FINDINGS: CT HEAD FINDINGS Brain: There is no evidence of an acute infarct, intracranial hemorrhage, mass, midline shift, or extra-axial fluid collection. The ventricles and sulci are within normal limits for age. Vascular: Calcified atherosclerosis at the skull base. No hyperdense vessel. Skull: No acute fracture or suspicious osseous lesion. Sinuses/Orbits: Mild right ethmoid air cell mucosal thickening. Clear mastoid air cells. Unremarkable orbits. Other: None. CT CERVICAL SPINE FINDINGS Alignment: Normal. Skull base and vertebrae: No acute fracture or suspicious osseous lesion. Soft tissues and spinal canal: No prevertebral fluid or swelling. No visible canal hematoma. Disc levels: Moderate cervical spondylosis and mild-to-moderate facet arthrosis. Multilevel neural foraminal stenosis due to uncovertebral spurring, moderate to severe on the right greater than left at C5-6, left greater than right at C6-7, and on the right at C7-T1. Upper chest: Clear lung apices. Other: Post treatment changes in the neck with mucosal edema. Decreased size of the left level II nodal mass and decreased size or possible resolution of the left-sided oropharyngeal mass, not primarily evaluated on this study. IMPRESSION: No evidence of acute intracranial abnormality or cervical spine fracture. Electronically Signed   By: Logan Bores M.D.   On: 05/03/2022 10:38   CT Cervical Spine Wo Contrast  Result Date:  05/03/2022 CLINICAL DATA:  Head trauma, moderate-severe; Neck trauma (Age >= 65y). Multiple recent falls. Currently undergoing treatment for oropharyngeal cancer. EXAM: CT HEAD WITHOUT CONTRAST CT CERVICAL SPINE WITHOUT CONTRAST TECHNIQUE: Multidetector CT imaging of the head and cervical spine was performed following the standard protocol without intravenous contrast. Multiplanar CT image reconstructions of the cervical spine were also generated. RADIATION DOSE REDUCTION: This exam was performed according to the departmental dose-optimization program which includes automated exposure control, adjustment of the mA and/or kV according to patient size and/or use of iterative reconstruction technique. COMPARISON:  Neck CT 02/02/2022 FINDINGS: CT HEAD FINDINGS Brain: There is no evidence of an acute infarct, intracranial hemorrhage, mass, midline shift, or extra-axial fluid collection. The  ventricles and sulci are within normal limits for age. Vascular: Calcified atherosclerosis at the skull base. No hyperdense vessel. Skull: No acute fracture or suspicious osseous lesion. Sinuses/Orbits: Mild right ethmoid air cell mucosal thickening. Clear mastoid air cells. Unremarkable orbits. Other: None. CT CERVICAL SPINE FINDINGS Alignment: Normal. Skull base and vertebrae: No acute fracture or suspicious osseous lesion. Soft tissues and spinal canal: No prevertebral fluid or swelling. No visible canal hematoma. Disc levels: Moderate cervical spondylosis and mild-to-moderate facet arthrosis. Multilevel neural foraminal stenosis due to uncovertebral spurring, moderate to severe on the right greater than left at C5-6, left greater than right at C6-7, and on the right at C7-T1. Upper chest: Clear lung apices. Other: Post treatment changes in the neck with mucosal edema. Decreased size of the left level II nodal mass and decreased size or possible resolution of the left-sided oropharyngeal mass, not primarily evaluated on this  study. IMPRESSION: No evidence of acute intracranial abnormality or cervical spine fracture. Electronically Signed   By: Logan Bores M.D.   On: 05/03/2022 10:38   DG Chest 2 View  Result Date: 05/03/2022 CLINICAL DATA:  Cough, weakness EXAM: CHEST - 2 VIEW COMPARISON:  11/08/2018 FINDINGS: Right IJ approach chest port with distal tip terminating the level of the distal SVC. Left-sided implanted cardiac device remains in place. Cardiomegaly. No focal airspace consolidation, pleural effusion, or pneumothorax. IMPRESSION: No active cardiopulmonary disease. Electronically Signed   By: Davina Poke D.O.   On: 05/03/2022 10:25   IR GJ Tube Change  Result Date: 04/16/2022 INDICATION: 77 year old gentleman with history of head and neck malignancy underwent percutaneous gastrostomy tube placement on 03/08/2022. He returns with difficulty flushing the tube. EXAM: Gastrostomy tube exchange MEDICATIONS: None ANESTHESIA/SEDATION: None CONTRAST:  10 mL of Omnipaque 300-administered into the gastric lumen. FLUOROSCOPY: Radiation Exposure Index (as provided by the fluoroscopic device): 50 mGy Kerma COMPLICATIONS: None immediate. PROCEDURE: Informed written consent was obtained from the patient after a thorough discussion of the procedural risks, benefits and alternatives. All questions were addressed. Maximal Sterile Barrier Technique was utilized including caps, mask, sterile gowns, sterile gloves, sterile drape, hand hygiene and skin antiseptic. A timeout was performed prior to the initiation of the procedure. Contrast administered through the gastrostomy tube showed continued communication with the gastric lumen. However, the balloon was located external to the stomach. 0.035 inch glidewire was advanced through the existing gastrostomy tube. The balloon was deflated and the existing 30 [place] G-tube was removed. New 18 French gastrostomy tube could not be easily advanced into the gastric lumen. Tract dilation was  performed with 18 and 20 [place] dilators. New 18 French gastrostomy tube was then advanced into the gastric lumen without difficulty. The balloon was inflated with 10 mL of normal saline. Contrast administered through the new 18 [place] gastrostomy tube confirmed appropriate positioning within the gastric lumen. The tube was flushed and covered with sterile dressing. IMPRESSION: Successful exchange of retracted percutaneous gastrostomy tube with new 50 French balloon retention tube in place. Electronically Signed   By: Miachel Roux M.D.   On: 04/16/2022 08:16    All questions were answered. The patient knows to call the clinic with any problems, questions or concerns. I spent 30 minutes in the care of this patient including H and P, review of records, counseling and coordination of care.     Benay Pike, MD 05/11/2022 2:41 PM

## 2022-05-12 ENCOUNTER — Other Ambulatory Visit: Payer: Self-pay

## 2022-05-12 ENCOUNTER — Inpatient Hospital Stay: Payer: PPO

## 2022-05-12 ENCOUNTER — Encounter: Payer: Self-pay | Admitting: Hematology and Oncology

## 2022-05-12 DIAGNOSIS — C01 Malignant neoplasm of base of tongue: Secondary | ICD-10-CM | POA: Diagnosis not present

## 2022-05-12 DIAGNOSIS — E871 Hypo-osmolality and hyponatremia: Secondary | ICD-10-CM | POA: Diagnosis not present

## 2022-05-12 DIAGNOSIS — R600 Localized edema: Secondary | ICD-10-CM | POA: Diagnosis not present

## 2022-05-12 DIAGNOSIS — C109 Malignant neoplasm of oropharynx, unspecified: Secondary | ICD-10-CM | POA: Diagnosis not present

## 2022-05-12 DIAGNOSIS — R972 Elevated prostate specific antigen [PSA]: Secondary | ICD-10-CM | POA: Diagnosis not present

## 2022-05-12 LAB — OSMOLALITY, URINE: Osmolality, Ur: 582 mOsm/kg (ref 300–900)

## 2022-05-12 MED ORDER — MAGNESIUM SULFATE 2 GM/50ML IV SOLN
2.0000 g | Freq: Once | INTRAVENOUS | Status: AC
  Administered 2022-05-12: 2 g via INTRAVENOUS
  Filled 2022-05-12: qty 50

## 2022-05-12 MED ORDER — SODIUM CHLORIDE 0.9 % IV SOLN: INTRAVENOUS | Status: DC

## 2022-05-12 NOTE — Patient Instructions (Signed)
Magnesium Sulfate Injection What is this medication? MAGNESIUM SULFATE (mag NEE zee um SUL fate) prevents and treats low levels of magnesium in your body. It may also be used to prevent and treat seizures during pregnancy in people with high blood pressure disorders, such as preeclampsia or eclampsia. Magnesium plays an important role in maintaining the health of your muscles and nervous system. This medicine may be used for other purposes; ask your health care provider or pharmacist if you have questions. What should I tell my care team before I take this medication? They need to know if you have any of these conditions: Heart disease History of irregular heart beat Kidney disease An unusual or allergic reaction to magnesium sulfate, medications, foods, dyes, or preservatives Pregnant or trying to get pregnant Breast-feeding How should I use this medication? This medication is for infusion into a vein. It is given in a hospital or clinic setting. Talk to your care team about the use of this medication in children. While this medication may be prescribed for selected conditions, precautions do apply. Overdosage: If you think you have taken too much of this medicine contact a poison control center or emergency room at once. NOTE: This medicine is only for you. Do not share this medicine with others. What if I miss a dose? This does not apply. What may interact with this medication? Certain medications for anxiety or sleep Certain medications for seizures, such phenobarbital Digoxin Medications that relax muscles for surgery Narcotic medications for pain This list may not describe all possible interactions. Give your health care provider a list of all the medicines, herbs, non-prescription drugs, or dietary supplements you use. Also tell them if you smoke, drink alcohol, or use illegal drugs. Some items may interact with your medicine. What should I watch for while using this  medication? Your condition will be monitored carefully while you are receiving this medication. You may need blood work done while you are receiving this medication. What side effects may I notice from receiving this medication? Side effects that you should report to your care team as soon as possible: Allergic reactions--skin rash, itching, hives, swelling of the face, lips, tongue, or throat High magnesium level--confusion, drowsiness, facial flushing, redness, sweating, muscle weakness, fast or irregular heartbeat, trouble breathing Low blood pressure--dizziness, feeling faint or lightheaded, blurry vision Side effects that usually do not require medical attention (report to your care team if they continue or are bothersome): Headache Nausea This list may not describe all possible side effects. Call your doctor for medical advice about side effects. You may report side effects to FDA at 1-800-FDA-1088. Where should I keep my medication? This medication is given in a hospital or clinic and will not be stored at home. NOTE: This sheet is a summary. It may not cover all possible information. If you have questions about this medicine, talk to your doctor, pharmacist, or health care provider.  2023 Elsevier/Gold Standard (2012-08-31 00:00:00)  

## 2022-05-12 NOTE — Assessment & Plan Note (Signed)
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  He completed 6 weekly cycles of cisplatin and radiation on December 18.  Soon after he felt extremely weak, was hallucinating, questionable hallucinations related to scopolamine patch, had multiple falls and hence his wife called EMS.  He was admitted and treated for hyponatremia.  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  He was discharged on sodium tablets.  He continues to feel weak but better than when he was hospitalized.  His weakness is more pronounced in the mornings.  Physical exam, he appears exhausted but no other acute distress noticed.  We have reviewed his labs which showed stable sodium.  I have reviewed the case with Dr. Carolin Sicks who happens to be on-call for nephrology and he recommended continuing the sodium tablets, restrict free fluid to 1200 mL a day and monitor BMP in a week.  We have also ordered urine sodium and urine osmolality today. With regards to hypotension, his blood pressure at the time of the visit appears to be normal.  Wife mentioned that her PCP has just discontinued the metoprolol as well.  This may help.  I believe the hypotension episodes could be related to combination of things including tremendous weight loss, poor p.o. intake and being on antihypertensive medication.  I encouraged her to continue monitoring this. All the questions were answered to the best my knowledge.  I will see them back in 1 week.

## 2022-05-13 ENCOUNTER — Encounter: Payer: Self-pay | Admitting: Radiation Oncology

## 2022-05-13 ENCOUNTER — Inpatient Hospital Stay: Payer: PPO | Admitting: Dietician

## 2022-05-13 ENCOUNTER — Ambulatory Visit
Admission: RE | Admit: 2022-05-13 | Discharge: 2022-05-13 | Disposition: A | Discharge status: Home / Self Care | Payer: PPO | Source: Ambulatory Visit | Attending: Radiation Oncology | Admitting: Radiation Oncology

## 2022-05-13 VITALS — BP 104/60 | HR 84 | Temp 98.3°F | Resp 20 | Ht 72.0 in | Wt 262.0 lb

## 2022-05-13 DIAGNOSIS — C01 Malignant neoplasm of base of tongue: Secondary | ICD-10-CM | POA: Insufficient documentation

## 2022-05-13 DIAGNOSIS — C109 Malignant neoplasm of oropharynx, unspecified: Secondary | ICD-10-CM | POA: Diagnosis not present

## 2022-05-13 DIAGNOSIS — Z79899 Other long term (current) drug therapy: Secondary | ICD-10-CM | POA: Insufficient documentation

## 2022-05-13 DIAGNOSIS — M47812 Spondylosis without myelopathy or radiculopathy, cervical region: Secondary | ICD-10-CM | POA: Insufficient documentation

## 2022-05-13 DIAGNOSIS — I082 Rheumatic disorders of both aortic and tricuspid valves: Secondary | ICD-10-CM | POA: Insufficient documentation

## 2022-05-13 NOTE — Progress Notes (Signed)
Nutrition Follow-up:  Patient has completed concurrent chemoradiation for SCC of oropharynx. Last radiation on 04/25/22.   Noted hospital admission 12/26-12/30 for hypokalemia/hyponatremia   Met with patient in office. He is walking with a cane today. Patient continues to feel week s/p recent admission. Wife reports home PT is supposed to come tomorrow. Patient stopped using tube for feeding on 1/4. Patient is flushing tube with 8 ounces of water. He is able to drink orally without difficulty. Patient continues to have altered taste. Everything is horrible. Patient reports he was discharged on sodium tablets (4g/day) as well as 1200 ml free fluid restriction. He is currently drinking 3 Equate HP plus whey protein powder mixed with cup of milk three times daily. Per wife, this is providing pt with 2040 kcal, 153 grams protein. Patient reports bowels moving every 2 days. He denies nausea, vomiting.  Medications: reviewed   Labs: 1/3 labs reviewed - Hgb 9.2, BUN 25,  glucose 112, Na 127, Mg 1.6  Anthropometrics: Pt 263.6 lb in office today  1/3 - 266 lb 14.4 oz  12/12 - 259 lb 4.8 oz  11/21 - 269 lb 9.6 oz    NUTRITION DIAGNOSIS: Inadequate oral intake improving    INTERVENTION:  Continue trying new foods Suggested pt drink 4 Ensure Plus + 2 cups whole milk with protein powder (2040 kcal, 128 g protein)  Will continue monitoring weekly labs    MONITORING, EVALUATION, GOAL: weight trends, intake   NEXT VISIT: To be scheduled

## 2022-05-13 NOTE — Progress Notes (Signed)
Radiation Oncology         [phone] ________________________________  Name: Clarence Andrade MRN: [id number]  Date: 05/13/2022  DOB: [birth year]  Follow-Up Visit Note  CC: Clarence Frames, MD  Clarence Downer, MD  Diagnosis and Prior Radiotherapy:       ICD-10-CM   1. Squamous cell carcinoma of oropharynx (HCC)  C10.9     2. Malignant neoplasm of base of tongue (HCC)  C01       Cancer Staging  Malignant neoplasm of base of tongue (HCC) Staging form: Pharynx - HPV-Mediated Oropharynx, AJCC 8th Edition - Clinical stage from 02/18/2022: Stage II (cT3, cN1, cM0, p16+) - Unsigned Stage prefix: Initial diagnosis  Squamous cell carcinoma of oropharynx (HCC) Staging form: Pharynx - HPV-Mediated Oropharynx, AJCC 8th Edition - Clinical: Stage I (cT2, cN1, cM0, p16+) - Signed by Benay Pike, MD on 02/17/2022 Stage prefix: Initial diagnosis ==========DELIVERED PLANS==========  First Treatment Date: 2022-03-09 - Last Treatment Date: 2022-04-25   Plan Name: HN_BOT Site: Oropharynx Technique: IMRT Mode: Photon Dose Per Fraction: 2 Gy Prescribed Dose (Delivered / Prescribed): 66 Gy / 70 Gy Prescribed Fxs (Delivered / Prescribed): 33 / 35 (patient ended treatment prematurely)  CHIEF COMPLAINT:  Here for follow-up and surveillance of throat cancer  Narrative:  The patient returns today for routine follow-up.  Clarence Andrade presents for follow up for completion of radiation treatment for Squamous cell carcinoma of oropharynx . He completed treatment on 04-25-22.   Pain issues, if any: none Using a feeding tube? Just flushing now, doing ensures by mouth, trying more solid food,  Weight changes, if any:  Wt Readings from Last 3 Encounters:  05/13/22 262 lb (118.8 kg)  05/11/22 266 lb 14.4 oz (121.1 kg)  05/06/22 261 lb 0.4 oz (118.4 kg)    Swallowing issues, if any: no difficulty swallowing, nothing taste good to pt Smoking or chewing tobacco? none Using fluoride trays daily?  none Last ENT visit was on: none since treatment Other notable issues, if any: energy level is extremely low and bp is low in the morning. Doing ensures by mouth three times as a day, intermittent tremors                       ALLERGIES:  is allergic to sertraline hcl and transderm-scop [scopolamine].  Meds: Current Outpatient Medications  Medication Sig Dispense Refill   acetaminophen (TYLENOL) 500 MG tablet Place 1,000 mg into feeding tube every 6 (six) hours as needed (for pain).     aspirin EC 81 MG tablet 81 mg See admin instructions. 81 mg, per tube, once a day     buPROPion (WELLBUTRIN XL) 150 MG 24 hr tablet 150 mg See admin instructions. 150 mg, per tube, every morning     feeding supplement, ENSURE COMPLETE, (ENSURE COMPLETE) LIQD Place 237 mLs into feeding tube See admin instructions. Mix 237 ml's of Ensure Complete with 237 ml's of milk = 3 feedings a day     nitroGLYCERIN (NITROSTAT) 0.4 MG SL tablet Place 1 tablet (0.4 mg total) under the tongue every 5 (five) minutes as needed for chest pain. 25 tablet 3   rosuvastatin (CRESTOR) 10 MG tablet TAKE 1 TABLET ONCE DAILY. (Patient taking differently: Place 10 mg into feeding tube daily.) 90 tablet 0   sodium chloride 1 g tablet Take 2 tablets (2 g total) by mouth 2 (two) times daily with a meal. 30 tablet 3   tamsulosin (FLOMAX) 0.4 MG CAPS  capsule Take 0.4 mg by mouth daily.     temazepam (RESTORIL) 15 MG capsule Take 1 capsule (15 mg total) by mouth at bedtime as needed for sleep. (Patient taking differently: Place 15 mg into feeding tube at bedtime.) 30 capsule 0   Water For Irrigation, Sterile (FREE WATER) SOLN Place 200 mLs into feeding tube every 8 (eight) hours.     COVID-19 mRNA bivalent vaccine, Pfizer, (PFIZER COVID-19 VAC BIVALENT) injection Inject into the muscle. (Patient not taking: Reported on 05/03/2022) 0.3 mL 0   COVID-19 mRNA Vac-TriS, Pfizer, (PFIZER-BIONT COVID-19 VAC-TRIS) SUSP injection Inject into the muscle.  (Patient not taking: Reported on 05/03/2022) 0.3 mL 0   COVID-19 mRNA vaccine 2023-2024 (COMIRNATY) syringe Inject into the muscle. (Patient not taking: Reported on 05/03/2022) 0.3 mL 0   influenza vaccine adjuvanted (FLUAD) 0.5 ML injection Inject into the muscle. (Patient not taking: Reported on 05/03/2022) 0.5 mL 0   metoprolol succinate (TOPROL-XL) 25 MG 24 hr tablet Take 0.5 tablets (12.5 mg total) by mouth daily. (Patient not taking: Reported on 05/03/2022) 90 tablet 3   Nutritional Supplements (ISOSOURCE) LIQD Place into feeding tube See admin instructions. Three feedings a day (Patient not taking: Reported on 05/13/2022)     Protein (FEEDING SUPPLEMENT, PROSOURCE TF20,) liquid Place 60 mLs into feeding tube daily. (Patient not taking: Reported on 05/03/2022)     RSV vaccine recomb adjuvanted (AREXVY) 120 MCG/0.5ML injection Inject into the muscle. (Patient not taking: Reported on 05/03/2022) 0.5 mL 0   scopolamine (TRANSDERM-SCOP) 1 MG/3DAYS Place 1 patch (1.5 mg total) onto the skin every 3 (three) days. (Patient not taking: Reported on 05/03/2022) 10 patch 2   No current facility-administered medications for this encounter.    Physical Findings: The patient is in no acute distress. Patient is alert and oriented. Wt Readings from Last 3 Encounters:  05/13/22 262 lb (118.8 kg)  05/11/22 266 lb 14.4 oz (121.1 kg)  05/06/22 261 lb 0.4 oz (118.4 kg)    height is 6' (1.829 m) and weight is 262 lb (118.8 kg). His temperature is 98.3 F (36.8 C). His blood pressure is 104/60 and his pulse is 84. His respiration is 20 and oxygen saturation is 98%. .  General: Alert and oriented, in no acute distress HEENT: Head is normocephalic. Extraocular movements are intact. Oropharynx is notable for no thrush or visible tumor externally Neck: Neck is notable for  no masses - skin is dry Skin: Skin in treatment fields shows dryness, flaking Heart: Regular in rate and rhythm with murmur heard. Chest:  Clear to auscultation bilaterally, with no rhonchi, wheezes, or rales. Lymphatics: see Neck Exam Psychiatric: Judgment and insight are intact. Affect is appropriate.   Lab Findings: Lab Results  Component Value Date   WBC 6.2 05/11/2022   HGB 9.2 (L) 05/11/2022   HCT 26.4 (L) 05/11/2022   MCV 95.0 05/11/2022   PLT 212 05/11/2022    Lab Results  Component Value Date   TSH 0.315 (L) 05/04/2022    Radiographic Findings: DG CHEST PORT 1 VIEW  Result Date: 05/05/2022 CLINICAL DATA:  Shortness of breath EXAM: PORTABLE CHEST 1 VIEW COMPARISON:  05/03/2022 FINDINGS: Left pacer and right Port-A-Cath remain in place, unchanged. Heart and mediastinal contours are within normal limits. No focal opacities or effusions. No acute bony abnormality. IMPRESSION: No active cardiopulmonary disease. Electronically Signed   By: Rolm Baptise M.D.   On: 05/05/2022 15:22   ECHOCARDIOGRAM COMPLETE  Result Date: 05/04/2022    ECHOCARDIOGRAM  REPORT   Patient Name:   Clarence Andrade Date of Exam: 05/04/2022 Medical Rec #:  [id number]       Height:       72.0 in Accession #:    [id number]      Weight:       268.0 lb Date of Birth:  February 26, 1946       BSA:          2.413 m Patient Age:    49 years        BP:           131/96 mmHg Patient Gender: M               HR:           99 bpm. Exam Location:  Inpatient Procedure: 2D Echo and Intracardiac Opacification Agent Indications:    CHF  History:        Patient has prior history of Echocardiogram examinations, most                 recent 03/10/2020. Signs/Symptoms:Murmur.  Sonographer:    Harvie Junior Referring Phys: [id number] FANG XU  Sonographer Comments: Technically difficult study due to poor echo windows and patient is obese. Image acquisition challenging due to patient body habitus and Image acquisition challenging due to respiratory motion. IMPRESSIONS  1. Limited study due to poor echo windows.  2. Left ventricular ejection fraction, by estimation, is 55 to 60%. The  left ventricle has normal function. The left ventricle has no regional wall motion abnormalities. There is mild concentric left ventricular hypertrophy. Left ventricular diastolic parameters are indeterminate.  3. Right ventricular systolic function is normal. The right ventricular size is not well visualized. There is normal pulmonary artery systolic pressure.  4. The mitral valve is grossly normal. Trivial mitral valve regurgitation. No evidence of mitral stenosis.  5. There is at least mild aortic stenosis and mean gradient may be underestimated due to suboptimal doppler angle of interrogation for both LVOT VTI and AoV VTI. The aortic valve is calcified. Aortic valve regurgitation is not visualized. There is at least mild aortic stenosis. Aortic valve mean gradient measures 15.7 mmHg. Aortic valve Vmax measures 2.53 m/s.  6. Aortic dilatation noted. There is borderline dilatation of the aortic root, measuring 40 mm. Comparison(s): Compared to prior TTE in 2021, there continues to be at least mild AS (prior mean gradient 36mHg). Otherwise, there is no significant change. FINDINGS  Left Ventricle: Left ventricular ejection fraction, by estimation, is 55 to 60%. The left ventricle has normal function. The left ventricle has no regional wall motion abnormalities. The left ventricular internal cavity size was normal in size. There is  mild concentric left ventricular hypertrophy. Left ventricular diastolic parameters are indeterminate. Right Ventricle: The right ventricular size is not well visualized. Right vetricular wall thickness was not well visualized. Right ventricular systolic function is normal. There is normal pulmonary artery systolic pressure. The tricuspid regurgitant velocity is 2.77 m/s, and with an assumed right atrial pressure of 3 mmHg, the estimated right ventricular systolic pressure is 327.0mmHg. Left Atrium: Left atrial size was not well visualized. Right Atrium: Right atrial size was not well  visualized. Pericardium: There is no evidence of pericardial effusion. Mitral Valve: The mitral valve is grossly normal. There is mild thickening of the mitral valve leaflet(s). There is mild calcification of the mitral valve leaflet(s). Trivial mitral valve regurgitation. No evidence of mitral valve stenosis. Tricuspid Valve: The tricuspid valve is  grossly normal. Tricuspid valve regurgitation is trivial. Aortic Valve: There is at least mild aortic stenosis and mean gradient may be underestimated due to suboptimal doppler angle of interrogation for both LVOT VTI and AoV VTI. The aortic valve is calcified. Aortic valve regurgitation is not visualized. There is at least mild aortic stenosis. Aortic valve mean gradient measures 15.7 mmHg. Aortic valve peak gradient measures 25.6 mmHg. Aortic valve area, by VTI measures 1.74 cm. Pulmonic Valve: The pulmonic valve was not well visualized. Aorta: Aortic dilatation noted. There is borderline dilatation of the aortic root, measuring 40 mm. IAS/Shunts: The atrial septum is grossly normal.  LEFT VENTRICLE PLAX 2D LVIDd:         4.20 cm      Diastology LVIDs:         2.60 cm      LV e' medial:    9.90 cm/s LV PW:         1.30 cm      LV E/e' medial:  8.7 LV IVS:        1.30 cm      LV e' lateral:   11.40 cm/s LVOT diam:     2.30 cm      LV E/e' lateral: 7.6 LV SV:         66 LV SV Index:   28 LVOT Area:     4.15 cm  LV Volumes (MOD) LV vol d, MOD A4C: 104.0 ml LV vol s, MOD A4C: 50.1 ml LV SV MOD A4C:     104.0 ml RIGHT VENTRICLE RV S prime:     15.10 cm/s TAPSE (M-mode): 2.1 cm LEFT ATRIUM             Index        RIGHT ATRIUM           Index LA Vol (A2C):   57.2 ml 23.71 ml/m  RA Area:     17.40 cm LA Vol (A4C):   62.0 ml 25.70 ml/m  RA Volume:   47.50 ml  19.69 ml/m LA Biplane Vol: 60.4 ml 25.03 ml/m  AORTIC VALVE                     PULMONIC VALVE AV Area (Vmax):    1.44 cm      PV Vmax:       1.37 m/s AV Area (Vmean):   1.79 cm      PV Peak grad:  7.5 mmHg AV  Area (VTI):     1.74 cm AV Vmax:           252.80 cm/s AV Vmean:          163.667 cm/s AV VTI:            0.382 m AV Peak Grad:      25.6 mmHg AV Mean Grad:      15.7 mmHg LVOT Vmax:         87.50 cm/s LVOT Vmean:        70.500 cm/s LVOT VTI:          0.160 m LVOT/AV VTI ratio: 0.42  AORTA Ao Root diam: 3.60 cm MITRAL VALVE               TRICUSPID VALVE MV Area (PHT): 6.17 cm    TR Peak grad:   30.7 mmHg MV Decel Time: 123 msec    TR Vmax:        277.00 cm/s MR  Peak grad: 76.2 mmHg MR Vmax:      436.33 cm/s  SHUNTS MV E velocity: 86.40 cm/s  Systemic VTI:  0.16 m MV A velocity: 50.30 cm/s  Systemic Diam: 2.30 cm MV E/A ratio:  1.72 Gwyndolyn Kaufman MD Electronically signed by Gwyndolyn Kaufman MD Signature Date/Time: 05/04/2022/4:45:28 PM    Final    VAS [place] LOWER EXTREMITY VENOUS (DVT)  Result Date: 05/03/2022  Lower Venous DVT Study Patient Name:  Clarence Andrade  Date of Exam:   05/03/2022 Medical Rec #: [id number]        Accession #:    [id number] Date of Birth: 11-16-1945        Patient Gender: M Patient Age:   21 years Exam Location:  [facility] Procedure:      VAS [place] LOWER EXTREMITY VENOUS (DVT) Referring Phys: Annamaria Boots XU --------------------------------------------------------------------------------  Indications: Edema.  Risk Factors: Cancer. Limitations: Poor ultrasound/tissue interface. Comparison Study: No prior studies. Performing Technologist: Oliver Hum RVT  Examination Guidelines: A complete evaluation includes B-mode imaging, spectral Doppler, color Doppler, and power Doppler as needed of all accessible portions of each vessel. Bilateral testing is considered an integral part of a complete examination. Limited examinations for reoccurring indications may be performed as noted. The reflux portion of the exam is performed with the patient in reverse Trendelenburg.  +---------+---------------+---------+-----------+----------+--------------+ RIGHT     CompressibilityPhasicitySpontaneityPropertiesThrombus Aging +---------+---------------+---------+-----------+----------+--------------+ CFV      Full           Yes      Yes                                 +---------+---------------+---------+-----------+----------+--------------+ SFJ      Full                                                        +---------+---------------+---------+-----------+----------+--------------+ FV Prox  Full                                                        +---------+---------------+---------+-----------+----------+--------------+ FV Mid   Full                                                        +---------+---------------+---------+-----------+----------+--------------+ FV DistalFull                                                        +---------+---------------+---------+-----------+----------+--------------+ PFV      Full                                                        +---------+---------------+---------+-----------+----------+--------------+  POP      Full           Yes      Yes                                 +---------+---------------+---------+-----------+----------+--------------+ PTV      Full                                                        +---------+---------------+---------+-----------+----------+--------------+ PERO     Full                                                        +---------+---------------+---------+-----------+----------+--------------+   +---------+---------------+---------+-----------+----------+-------------------+ LEFT     CompressibilityPhasicitySpontaneityPropertiesThrombus Aging      +---------+---------------+---------+-----------+----------+-------------------+ CFV      Full           Yes      Yes                                      +---------+---------------+---------+-----------+----------+-------------------+ SFJ      Full                                                              +---------+---------------+---------+-----------+----------+-------------------+ FV Prox  Full                                                             +---------+---------------+---------+-----------+----------+-------------------+ FV Mid   Full                                                             +---------+---------------+---------+-----------+----------+-------------------+ FV DistalFull                                                             +---------+---------------+---------+-----------+----------+-------------------+ PFV      Full                                                             +---------+---------------+---------+-----------+----------+-------------------+ POP      Full  Yes      Yes                                      +---------+---------------+---------+-----------+----------+-------------------+ PTV      Full                                                             +---------+---------------+---------+-----------+----------+-------------------+ PERO                                                  Not well visualized +---------+---------------+---------+-----------+----------+-------------------+     Summary: RIGHT: - There is no evidence of deep vein thrombosis in the lower extremity. However, portions of this examination were limited- see technologist comments above.  - No cystic structure found in the popliteal fossa.  LEFT: - There is no evidence of deep vein thrombosis in the lower extremity. However, portions of this examination were limited- see technologist comments above.  - No cystic structure found in the popliteal fossa.  *See table(s) above for measurements and observations. Electronically signed by Jamelle Haring on 05/03/2022 at 4:33:57 PM.    Final    CT Head Wo Contrast  Result Date: 05/03/2022 CLINICAL DATA:  Head trauma, moderate-severe; Neck  trauma (Age >= 65y). Multiple recent falls. Currently undergoing treatment for oropharyngeal cancer. EXAM: CT HEAD WITHOUT CONTRAST CT CERVICAL SPINE WITHOUT CONTRAST TECHNIQUE: Multidetector CT imaging of the head and cervical spine was performed following the standard protocol without intravenous contrast. Multiplanar CT image reconstructions of the cervical spine were also generated. RADIATION DOSE REDUCTION: This exam was performed according to the departmental dose-optimization program which includes automated exposure control, adjustment of the mA and/or kV according to patient size and/or use of iterative reconstruction technique. COMPARISON:  Neck CT 02/02/2022 FINDINGS: CT HEAD FINDINGS Brain: There is no evidence of an acute infarct, intracranial hemorrhage, mass, midline shift, or extra-axial fluid collection. The ventricles and sulci are within normal limits for age. Vascular: Calcified atherosclerosis at the skull base. No hyperdense vessel. Skull: No acute fracture or suspicious osseous lesion. Sinuses/Orbits: Mild right ethmoid air cell mucosal thickening. Clear mastoid air cells. Unremarkable orbits. Other: None. CT CERVICAL SPINE FINDINGS Alignment: Normal. Skull base and vertebrae: No acute fracture or suspicious osseous lesion. Soft tissues and spinal canal: No prevertebral fluid or swelling. No visible canal hematoma. Disc levels: Moderate cervical spondylosis and mild-to-moderate facet arthrosis. Multilevel neural foraminal stenosis due to uncovertebral spurring, moderate to severe on the right greater than left at C5-6, left greater than right at C6-7, and on the right at C7-T1. Upper chest: Clear lung apices. Other: Post treatment changes in the neck with mucosal edema. Decreased size of the left level II nodal mass and decreased size or possible resolution of the left-sided oropharyngeal mass, not primarily evaluated on this study. IMPRESSION: No evidence of acute intracranial abnormality or  cervical spine fracture. Electronically Signed   By: Logan Bores M.D.   On: 05/03/2022 10:38   CT Cervical Spine Wo Contrast  Result Date: 05/03/2022 CLINICAL DATA:  Head trauma, moderate-severe; Neck trauma (Age >=  65y). Multiple recent falls. Currently undergoing treatment for oropharyngeal cancer. EXAM: CT HEAD WITHOUT CONTRAST CT CERVICAL SPINE WITHOUT CONTRAST TECHNIQUE: Multidetector CT imaging of the head and cervical spine was performed following the standard protocol without intravenous contrast. Multiplanar CT image reconstructions of the cervical spine were also generated. RADIATION DOSE REDUCTION: This exam was performed according to the departmental dose-optimization program which includes automated exposure control, adjustment of the mA and/or kV according to patient size and/or use of iterative reconstruction technique. COMPARISON:  Neck CT 02/02/2022 FINDINGS: CT HEAD FINDINGS Brain: There is no evidence of an acute infarct, intracranial hemorrhage, mass, midline shift, or extra-axial fluid collection. The ventricles and sulci are within normal limits for age. Vascular: Calcified atherosclerosis at the skull base. No hyperdense vessel. Skull: No acute fracture or suspicious osseous lesion. Sinuses/Orbits: Mild right ethmoid air cell mucosal thickening. Clear mastoid air cells. Unremarkable orbits. Other: None. CT CERVICAL SPINE FINDINGS Alignment: Normal. Skull base and vertebrae: No acute fracture or suspicious osseous lesion. Soft tissues and spinal canal: No prevertebral fluid or swelling. No visible canal hematoma. Disc levels: Moderate cervical spondylosis and mild-to-moderate facet arthrosis. Multilevel neural foraminal stenosis due to uncovertebral spurring, moderate to severe on the right greater than left at C5-6, left greater than right at C6-7, and on the right at C7-T1. Upper chest: Clear lung apices. Other: Post treatment changes in the neck with mucosal edema. Decreased size of  the left level II nodal mass and decreased size or possible resolution of the left-sided oropharyngeal mass, not primarily evaluated on this study. IMPRESSION: No evidence of acute intracranial abnormality or cervical spine fracture. Electronically Signed   By: Logan Bores M.D.   On: 05/03/2022 10:38   DG Chest 2 View  Result Date: 05/03/2022 CLINICAL DATA:  Cough, weakness EXAM: CHEST - 2 VIEW COMPARISON:  11/08/2018 FINDINGS: Right IJ approach chest port with distal tip terminating the level of the distal SVC. Left-sided implanted cardiac device remains in place. Cardiomegaly. No focal airspace consolidation, pleural effusion, or pneumothorax. IMPRESSION: No active cardiopulmonary disease. Electronically Signed   By: Davina Poke D.O.   On: 05/03/2022 10:25   IR GJ Tube Change  Result Date: 04/16/2022 INDICATION: 77 year old gentleman with history of head and neck malignancy underwent percutaneous gastrostomy tube placement on 03/08/2022. He returns with difficulty flushing the tube. EXAM: Gastrostomy tube exchange MEDICATIONS: None ANESTHESIA/SEDATION: None CONTRAST:  10 mL of Omnipaque 300-administered into the gastric lumen. FLUOROSCOPY: Radiation Exposure Index (as provided by the fluoroscopic device): 50 mGy Kerma COMPLICATIONS: None immediate. PROCEDURE: Informed written consent was obtained from the patient after a thorough discussion of the procedural risks, benefits and alternatives. All questions were addressed. Maximal Sterile Barrier Technique was utilized including caps, mask, sterile gowns, sterile gloves, sterile drape, hand hygiene and skin antiseptic. A timeout was performed prior to the initiation of the procedure. Contrast administered through the gastrostomy tube showed continued communication with the gastric lumen. However, the balloon was located external to the stomach. 0.035 inch glidewire was advanced through the existing gastrostomy tube. The balloon was deflated and the  existing 54 [place] G-tube was removed. New 18 French gastrostomy tube could not be easily advanced into the gastric lumen. Tract dilation was performed with 18 and 20 [place] dilators. New 18 French gastrostomy tube was then advanced into the gastric lumen without difficulty. The balloon was inflated with 10 mL of normal saline. Contrast administered through the new 18 [place] gastrostomy tube confirmed appropriate positioning within the gastric lumen.  The tube was flushed and covered with sterile dressing. IMPRESSION: Successful exchange of retracted percutaneous gastrostomy tube with new 37 French balloon retention tube in place. Electronically Signed   By: Miachel Roux M.D.   On: 04/16/2022 08:16    Impression/Plan:    1) Head and Neck Cancer Status: healing from ChRT. Advised to used lotion on skin for healing (still quite dry).  I'll see him back in 2.5 mo with restaging PET scan.  2) Nutritional Status: doing well, eating PEG tube: using a bit  3) Risk Factors: not smoking  4) Swallowing: functional, continue SLP  5) Dental: Encouraged to continue regular followup with dentistry, and dental hygiene including fluoride supplements   6) Thyroid function:  follow w PCP or med onc annually  Lab Results  Component Value Date   TSH 0.315 (L) 05/04/2022    7) Other: low BP - this should improve with time, following w cardiology, PCP, med onc. Advised to use bench in his shower to avoid syncope.    On date of service, in total, I spent 35 minutes on this encounter. Patient was seen in person. _____________________________________   [name], MD

## 2022-05-13 NOTE — Radiation Completion Notes (Signed)
Patient Name: Clarence Andrade, EWAN MRN: [id number] Date of Birth: Sep 15, 1945 Referring Physician: Jenetta Downer, M.D. Date of Service: 2022-05-13 Radiation Oncologist: [name], M.D. 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, N=cN1, M=cM0 Staging on 2022-02-18: Malignant neoplasm of base of tongue (HCC) T=cT3, N=cN1, M=cM0 Intent: Curative     ==========DELIVERED PLANS==========  First Treatment Date: 2022-03-09 - Last Treatment Date: 2022-04-25   Plan Name: HN_BOT Site: Oropharynx Technique: IMRT Mode: Photon Dose Per Fraction: 2 Gy Prescribed Dose (Delivered / Prescribed): 66 Gy / 70 Gy Prescribed Fxs (Delivered / Prescribed): 33 / 35     ==========ON TREATMENT VISIT DATES========== 2022-03-14, 2022-03-21, 2022-03-28, 2022-04-04, 2022-04-11, 2022-04-18, 2022-04-25     ==========UPCOMING VISITS========== 2024-01-05T20:20:00Z [place], MD; [name], MD        ==========APPENDIX - ON TREATMENT VISIT NOTES==========   PatEd 2022-03-09 Ongoing education performed.   ImpPlan 2022-03-09 The patient is tolerating radiation. Continue treatment as planned.   PhysExam 2022-03-09 Alert, no acute distress.   PatEd 2022-03-14 Ongoing education performed.   ImpPlan 2022-03-14 The patient is tolerating radiation. Continue treatment as planned.   PhysExam 2022-03-14 Alert, no acute distress.   RunningNotes 2022-03-14 03-14-22 education done   ProgNote 2022-03-14 Changes from last week/visit? [ Yes, issues with steroids ] Pain? [ Yes, normal back pain ] Dysphagia? [ No ] Thick saliva/mouth irritation? [ Yes, going on several months ] Mouth ulcers? [ No ] PEG tube? Any issues? [ no, ] Have they received  chemo (at any point during their radiation treatment)? [ Yes, one treatment last Eivor.Cane ] Taking anything by mouth or all via PEG? [ just flushing water ] How much clear fluid are they taking in? [ 32 oz ] Are they doing their salt/baking soda rinses? [ no ] Need refill on lotions? [ no ] Need refills: [ No ] Additional  Weekly Progress Notes [ concern for steroids side effects. Slept all day on Sunday. Sleepy today as well. constipation issues.  ]    PatEd 2022-03-21 Ongoing education performed.   ImpPlan 2022-03-21 The patient is tolerating radiation. Continue treatment as planned.   PhysExam 2022-03-21 Alert, no acute distress.   ProgNote 2022-03-21 Changes from last week/visit? [ No ] Pain? [ No ] Dysphagia? [ No, eating well ] Thick saliva/mouth irritation? [ Yes ] Mouth ulcers? [ No ] PEG tube? Any issues? [ No, flushes only ] Have they received chemo (at any point during their radiation treatment)? [ Yes, last Eivor.Cane ] Taking anything by mouth or all via PEG? [ flushes through peg ] How much clear fluid are they taking in? [ [place].Santa ] Are they doing their salt/baking soda rinses? [ Yes ] Need refill on lotions? [ Yes ] Need refills: [ No ] Additional  Weekly Progress Notes [ taste, thick secretions, no major concerns ]    PatEd 2022-03-28 Ongoing education performed.   PhysExam 2022-03-28 Alert, no acute distress.   ImpPlan 2022-03-28 The patient is tolerating radiation. Continue treatment as planned.   ProgNote 2022-03-28 Changes from last week/visit? [ Yes ] Pain? [ No ] Dysphagia? [ No ] Thick saliva/mouth irritation? [ Yes ]  Mouth ulcers? [ No ] PEG tube? Any issues? [ Yes, no issues ] Have they received chemo (at any point during their radiation treatment)? [ Yes, last Wednesday was last treatment ] Taking anything by mouth or all via PEG? [ flushes peg tube ] How much clear fluid are they taking in? [ 64 oz ] Are they doing their salt/baking soda rinses? [ Yes ] Need  refill on lotions? [ no ] Need refills: [ No ] Additional  Weekly Progress Notes [ having diarrhea/ constipation alternating, no urination issues.  ]    PatEd 2022-04-04 Ongoing education performed.   ImpPlan 2022-04-04 The patient is tolerating radiation. Continue treatment as planned.   PhysExam 2022-04-04 Alert, no acute distress.   ProgNote 2022-04-04 Changes from last week/visit? [ yes, taste changes getting worse ] Pain? [ No ] Dysphagia? [ No ] Thick saliva/mouth irritation? [ Yes ] Mouth ulcers? [ No ] PEG tube? Any issues? [ Yes, no issues ] Have they received chemo (at any point during their radiation treatment)? [ Yes, every Eivor.Cane ] Taking anything by mouth or all via PEG? [ Yes - liquids only, flushes ] How much clear fluid are they taking in? [ Evyn.Fought ] Are they doing their salt/baking soda rinses? [ Yes ] Need refill on lotions? [ Yes ] Need refills: [ No ] Additional  Weekly Progress Notes [ no major issues this week ]    PatEd 2022-04-11 Ongoing education performed.   ImpPlan 2022-04-11 The patient is tolerating radiation. Continue treatment as planned.   PhysExam 2022-04-11 Alert, no acute distress.   ProgNote 2022-04-11 Changes from last week/visit? [ Yes, starting use peg tube today ] Pain? [ Yes, mild ] Dysphagia? [ No ] Thick saliva/mouth irritation? [ Yes ] Mouth ulcers? [ No ] PEG tube? Any issues? [ Yes, starting using peg tube now, goal of 3-4 daily ] Have they received chemo (at any point during their radiation treatment)? [ Yes, chemo on Wednesday ] Taking anything by mouth or all via PEG? [ Yes - liquids only ] How much clear fluid are they taking in? [ 36 oz ] Are they doing their salt/baking soda rinses? [ Yes ] Need refill on lotions? [ yes ] Need refills: [ No ] Additional  Weekly Progress Notes [ bp low was low at home 74/57. better now.  ]    PatEd 2022-04-15 Ongoing education performed.   ImpPlan 2022-04-15 The patient is tolerating  radiation. Continue treatment as planned.   PhysExam 2022-04-15 Alert, no acute distress.   PatEd 2022-04-18 Ongoing education performed.   ImpPlan 2022-04-18 The patient is tolerating radiation. Continue treatment as planned.   PhysExam 2022-04-18 Alert, no acute distress.   ProgNote 2022-04-18 Changes from last week/visit? [ Yes ] Pain? [ No ] Dysphagia? [ Yes, difficulty swallowing, like swallowing glass ] Thick saliva/mouth irritation? [ Yes ] Mouth ulcers? [ No ] PEG tube? Any issues? [ Yes ] Have they received chemo (at any point during their radiation treatment)? [ Yes, Wednesday is next chemo ] Taking anything by mouth or all via PEG? [ yes, three of osmolite, mix with milk and water ] How much clear fluid are they taking in? [ none at this time ] Are they doing their salt/baking soda rinses? [ Yes ] Need refill on lotions? [ No ] Need refills: [  ] Additional  Weekly Progress Notes [ supplementing through the peg tube ]    PatEd 2022-04-19 Ongoing education performed.  ImpPlan 2022-04-19 The patient is tolerating radiation. Continue treatment as planned.   PhysExam 2022-04-19 Alert, no acute distress.   PatEd 2022-04-21 Ongoing education performed.   ImpPlan 2022-04-21 The patient is tolerating radiation. Continue treatment as planned.   PhysExam 2022-04-21 Alert, no acute distress.   PatEd 2022-04-25 Ongoing education performed.   ImpPlan 2022-04-25 The patient is tolerating radiation. Continue treatment as planned.   PhysExam 2022-04-25 Alert, no acute distress.   ProgNote 2022-04-25 Changes from last week/visit? [ Yes, secretions continued ] Pain? [ Yes, swallowing in painful ] Dysphagia? [ Yes, pain makes it difficult to swallow ] Thick saliva/mouth irritation? [ Yes ] Mouth ulcers? [ No ] PEG tube? Any issues? [ Yes, using peg tube ] Have they received chemo (at any point during their radiation treatment)? [ Yes, finished chemo ] Taking  anything by mouth or all via PEG? [ Yes - solid and liquids ] How much clear fluid are they taking in? [ 48 oz of water, three ensures day  ] Are they doing their salt/baking soda rinses? [ Yes ] Need refill on lotions? [ No ] Need refills: [ No ] Additional  Weekly Progress Notes [ small swallow of water to get pills down, and gags on that, wants something to help secretions cannot sleep at all ]

## 2022-05-15 ENCOUNTER — Encounter: Payer: Self-pay | Admitting: Physician Assistant

## 2022-05-15 NOTE — Progress Notes (Addendum)
Cardiology Office Note    Date:  05/16/2022   ID:  Clarence Andrade, DOB 1946/03/13, MRN [id number]  PCP:  Emilio Aspen, MD  Cardiologist:  Armanda Magic, MD  Electrophysiologist:  Regan Lemming, MD   Chief Complaint: f/u CAD, also recent hospitalization for falls  History of Present Illness:   Clarence Andrade is a 77 y.o. male with history of CAD by cath 03/2018 as below, ascending aortic aneurysm, aortic stenosis (mild by 04/2022 echo), CHB s/p Medtronic PPM 11/2018, pericarditis, prior hyperglycemia, hyponatremia, dyslipidemia, pulmonary nodule, adrenal incidentaloma, mild pulmonary HTN (normal by subsequent echoes), prior brief NSVT per PPM interrogations, essential HTN, morbid obesity, ventral hernia, squamous cell carcinoma of oropharynx who presents for routine follow-up.    He carries remote diagnosis of pericarditis and also CAD. In 2019 he developed chest pain that was potentially felt due to recurrent pericarditis, but also underwent concomitant workup for coronary disease. He underwent nuclear stress test in 03/2018 that was abnormal prompting cath which showed CTO of the prox-mid LAD with bridging, L-L and R-L collaterals, moderate non-obstructive disease involving the proximal LAD, proximal LCx, and distal RCA, more severe disease noted involving small branch vessels (RV marginal and superior branch of OM3). Cath notable for inability to cross the aortic valve for left heart catheterization due to insufficient catheter length complicated by tortuous subclavian artery and dilated ascending aorta. His antianginal therapy was titrated with recommendation to reserve CTO PCI for refractory angina. Cardiac MRI 04/09/18 done alongside workup showed findings below with no evidence for pericardial inflammation or pericardial effusion. He has history of fatigue but has declined evaluation for OSA. In 2020 he developed near-syncope and was found to have intermitted complete heart block  on outpatient monitor with continued bradycardia despite BB cessation, ultimately requiring PPM 11/2018. Prior device interrogations have shown very short runs of NSVT prompting initiation of metoprolol. He was trialed on higher dose 50mg  daily but did not tolerate due to headaches/fatigue so it was reduced back to 25mg  daily. We have also followed his TAA, with last CTA 10/2021 4.5cm without interval change, continued pulm nodule without interval change (felt likely benign by radiology read in the past).  Unfortunately since the last visit he has ben diagnosed with squamous cell carcinoma of the oropharynx requiring chemo with cisplatin, 33 cycles of radiation, and PEG tube. He required admission recently for multiple medical issues including hyponatremia, unsteady gait, frequent falls, confusion, hypomagnesemia, anasarca felt due to protein calorie malnutrition/low albumin, UTI, FTT, and anemia by labs. 2D Echo done for edema 05/04/22 showed EF 55-60%, indeterminate diastolic parameters, normal RV, possible mild AS, borderline dilation of aortic root. He was treated with hypertonic saline and sodium chloride tablets. BP meds were also adjusted. He has since had discontinuation of his metoprolol due to soft BP. Flomax was moved to nightly (was trialed off this completely but developed UTI so had to go back on). Oncology has since been following his hyponatremia and hypomagnesemia in follow-up. Dr. Al Pimple spoke with nephrology and he's been continued on 1200 fluid restriction and salt tablets.   He returns for follow-up overall doing better than recent hospitalization. He is here with wife Corrie Dandy. His confusion cleared. Appetite remains poor. They have not had to use his peg tube and the hope is that it will be able to be removed in a month or so per their report. His edema has improved back closer to baseline. No CP, SOB, palpitations. He is having labs  with oncology tomorrow afternoon.  Labwork independently  reviewed: 05/11/22 Hgb 9.2, plt 212, Mg 1.6, Na 127, Cr 0.62 04/2022 albumin 2.7, LFTs otherwise ok, TSH low at 0.315, trops low/flat 21-18 03/2021 LDL 61, trig 133  Past History   Past Medical History:  Diagnosis Date   Abdominal hernia    Midline   Adrenal incidentaloma (HCC) 2010   Aortic stenosis    Ascending aortic aneurysm (HCC)    CAD (coronary artery disease)    a. Cath 03/2018 - CTO of the prox-mid LAD with bridging, L-L and R-L collaterals, moderate, non-obstructive disease involving the proximal LAD, proximal LCx, and distal RCA, more severe disease noted involving small branch vessels (RV marginal and superior branch of OM3).   Complete heart block (HCC)    Gallstones    Heart murmur    History of pacemaker    a. 11/2018 - Medtronic, intermittent CHB.   Hyperlipidemia    Morbid obesity (HCC)    Myocardial infarction [employer])    NSVT (nonsustained ventricular tachycardia) (HCC)    OSA (obstructive sleep apnea)    Severe PSG 1/07 AHI 66/hr, O2 Nadir 50% Refuses treatment - Pt does not believe test was accurate   Pericarditis    Pneumonia    Pulmonary HTN (HCC)    mild with PASP by echo 02/2018, not seen on 03/2020 echo   Pulmonary nodule    Screening for AAA (abdominal aortic aneurysm) 2010   CT Abd/Pelvis   Squamous cell carcinoma of oropharynx (HCC)    Ventral hernia    03/2009    Past Surgical History:  Procedure Laterality Date   CHOLECYSTECTOMY N/A 10/11/2019   Procedure: LAPAROSCOPIC CHOLECYSTECTOMY WITH INTRAOPERATIVE CHOLANGIOGRAM;  Surgeon: Darnell Level, MD;  Location: [facility];  Service: General;  Laterality: N/A;   CORONARY ANGIOGRAPHY N/A 03/23/2018   Procedure: CORONARY ANGIOGRAPHY;  Surgeon: Yvonne Kendall, MD;  Location: [facility];  Service: Cardiovascular;  Laterality: N/A;   HERNIA REPAIR  05/2002   three   IR GASTROSTOMY TUBE MOD SED  03/08/2022   IR GJ TUBE CHANGE  04/15/2022   IR IMAGING GUIDED PORT INSERTION  03/08/2022   KNEE  ARTHROSCOPY Left 09/2002   PACEMAKER IMPLANT N/A 11/08/2018   Procedure: PACEMAKER IMPLANT;  Surgeon: Regan Lemming, MD;  Location: [facility];  Service: Cardiovascular;  Laterality: N/A;   SHOULDER SURGERY Right 2020   TOTAL KNEE ARTHROPLASTY Bilateral 09/2010   VENTRAL HERNIA REPAIR  03/2009    Current Medications: Current Meds  Medication Sig   feeding supplement, ENSURE COMPLETE, (ENSURE COMPLETE) LIQD Place 237 mLs into feeding tube See admin instructions. Mix 237 ml's of Ensure Complete with 237 ml's of milk = 3 feedings a day   Nutritional Supplements (ISOSOURCE) LIQD Place into feeding tube See admin instructions. Three feedings a day   Protein (FEEDING SUPPLEMENT, PROSOURCE TF20,) liquid Place 60 mLs into feeding tube daily.   rosuvastatin (CRESTOR) 10 MG tablet TAKE 1 TABLET ONCE DAILY.   sodium chloride 1 g tablet Take 2 tablets (2 g total) by mouth 2 (two) times daily with a meal.   tamsulosin (FLOMAX) 0.4 MG CAPS capsule Take 0.4 mg by mouth daily.   temazepam (RESTORIL) 15 MG capsule Take 1 capsule (15 mg total) by mouth at bedtime as needed for sleep.   Water For Irrigation, Sterile (FREE WATER) SOLN Place 200 mLs into feeding tube every 8 (eight) hours.     Allergies:   Sertraline hcl  and Transderm-scop [scopolamine]   Social History   Socioeconomic History   Marital status: Married    Spouse name: Not on file   Number of children: Not on file   Years of education: Not on file   Highest education level: Not on file  Occupational History   Not on file  Tobacco Use   Smoking status: Former    Packs/day: 0.50    Types: Cigarettes    Quit date: 52    Years since quitting: 31.0   Smokeless tobacco: Never  Vaping Use   Vaping Use: Never used  Substance and Sexual Activity   Alcohol use: No   Drug use: No   Sexual activity: Not Currently  Other Topics Concern   Not on file  Social History Narrative   Not on file   Social Determinants of  Health   Financial Resource Strain: Low Risk  (02/24/2022)   Overall Financial Resource Strain (CARDIA)    Difficulty of Paying Living Expenses: Not hard at all  Food Insecurity: No Food Insecurity (05/04/2022)   Hunger Vital Sign    Worried About Running Out of Food in the Last Year: Never true    Ran Out of Food in the Last Year: Never true  Transportation Needs: No Transportation Needs (05/04/2022)   PRAPARE - Administrator, Civil Service (Medical): No    Lack of Transportation (Non-Medical): No  Physical Activity: Not on file  Stress: Not on file  Social Connections: Not on file     Family History:  The patient's family history includes Aneurysm in his father; Breast cancer in his sister; Colon cancer in his sister; Heart attack in his father; Heart disease in his father.  ROS:   Please see the history of present illness.  All other systems are reviewed and otherwise negative.    EKG(s)/Additional Testing   EKG:  EKG is not ordered today. Reviewed from hospitalization - NSR first degree AVB with suspected blocked PAC followed by paced atrial beating, nonspecific STTW changes  CV Studies: Cardiac studies reviewed are outlined and summarized above. Otherwise please see EMR for full report.  Recent Labs: 05/03/2022: B Natriuretic Peptide 181.7 05/04/2022: TSH 0.315 05/07/2022: ALT 29 05/11/2022: BUN 25; Creatinine, Ser 0.62; Hemoglobin 9.2; Magnesium 1.6; Platelet Count 212; Potassium 4.7; Sodium 127  Recent Lipid Panel    Component Value Date/Time   CHOL 130 03/30/2021 1119   TRIG 133 03/30/2021 1119   HDL 46 03/30/2021 1119   CHOLHDL 2.8 03/30/2021 1119   CHOLHDL 3.2 06/05/2010 0715   VLDL 10 06/05/2010 0715   LDLCALC 61 03/30/2021 1119    PHYSICAL EXAM:    VS:  BP 99/64   Pulse 71   Ht 6' (1.829 m)   Wt 256 lb 12.8 oz (116.5 kg)   SpO2 97%   BMI 34.83 kg/m   BMI: Body mass index is 34.83 kg/m.  GEN: Well nourished, well developed male in no  acute distress HEENT: normocephalic, atraumatic Neck: no JVD, carotid bruits, or masses Cardiac: RRR; no murmurs, rubs, or gallops, trace sockline edema  Respiratory:  clear to auscultation bilaterally, normal work of breathing GI: soft, nontender, nondistended, + BS MS: no deformity or atrophy Skin: warm and dry, no rash Neuro:  Alert and Oriented x 3, Strength and sensation are intact, follows commands Psych: euthymic mood, full affect  Wt Readings from Last 3 Encounters:  05/16/22 256 lb 12.8 oz (116.5 kg)  05/13/22 262 lb (118.8 kg)  05/11/22 266 lb 14.4 oz (121.1 kg)     ASSESSMENT & PLAN:   1. CAD, dyslipidemia - stable. Continue ASA. He is no longer taking metoprolol due to hypotension. Can consider restarting lower dose when this improves. Continue rosuvastatin as tolerated (not on higher dose due to h/o myalgias). He is due for lipids but given acute issues we can push out a little bit - would plan to recheck at visit in April. AM appointment made so he can come fasting.  2. Mild aortic stenosis - no specific intervention needed at this time for this. Consider repeat echo in 3-5 years or as clinically indicated.  3. NSVT, h/o PPM - beta blocker stopped as above. Oncology is managing his electrolytes. Recommend keeping K 4.0 or greater and Mg 2.0 or greater. No syncope reported. Our CMA reached out to device clinic to find out how to get them back on track for device interrogation since he was in the hospital in December. They report they received a transmission 12/30 and his next one is due 08/04/22. The report has not yet been uploaded to Epic. At last f/u 02/2022 Dr. Elberta Fortis suggested f/u 5 months but do not see recall in. Will reach out to him to clarify when next f/u should be planned.  4. Ascending aortic aneurysm - previously discussed aneurysm precautions with patient including lifting restrictions and avoidance of fluoroquinolones. This was stable by CT 10/2021. He has  historically preferred 18 month f/u of this. It would otherwise be due 10/2022. We can finalize plans at f/u later this spring.  5. Recent complex issues including abnormal labs, hypotension, lower extremity edema - edema improved. He is being followed closely by oncology and has labwork planned tomorrow. I will have our office reach out to her office to find out if they would be willing to add on TSH and free T4 to labs since his TSH was suppressed in the hospital recently. He otherwise had already saw primary care and I do not see this was specifically rechecked. Regarding his hypotension, his BP remains on the soft side. Metoprolol has already been discontinued and he was unable to tolerate coming off Flomax. We discussed trial of low dose midodrine and he does not wish to add at this time. Encouraged continued f/u oncology as scheduled, including to continue to review plan for ongoing sodium chloride supplementation.    Disposition: F/u with me in 08/2022. Addendum: Dr. Elberta Fortis replied that he can f/u with EP 12 months since last OV - he forwarded msg to his nurse to get arranged.   Medication Adjustments/Labs and Tests Ordered: Current medicines are reviewed at length with the patient today.  Concerns regarding medicines are outlined above. Medication changes, Labs and Tests ordered today are summarized above and listed in the Patient Instructions accessible in Encounters.   Signed, Laurann Montana, PA-C  05/16/2022 3:53 PM    [facility] Phone: [phone]; Fax: [phone]

## 2022-05-16 ENCOUNTER — Encounter: Payer: Self-pay | Admitting: Physician Assistant

## 2022-05-16 ENCOUNTER — Other Ambulatory Visit: Payer: Self-pay | Admitting: Hematology and Oncology

## 2022-05-16 ENCOUNTER — Other Ambulatory Visit: Payer: Self-pay

## 2022-05-16 ENCOUNTER — Ambulatory Visit: Payer: PPO | Attending: Physician Assistant | Admitting: Physician Assistant

## 2022-05-16 ENCOUNTER — Other Ambulatory Visit: Payer: Self-pay | Admitting: *Deleted

## 2022-05-16 ENCOUNTER — Telehealth: Payer: Self-pay | Admitting: Physician Assistant

## 2022-05-16 ENCOUNTER — Ambulatory Visit: Payer: Self-pay | Admitting: Physical Therapy

## 2022-05-16 VITALS — BP 99/64 | HR 71 | Ht 72.0 in | Wt 256.8 lb

## 2022-05-16 DIAGNOSIS — R899 Unspecified abnormal finding in specimens from other organs, systems and tissues: Secondary | ICD-10-CM

## 2022-05-16 DIAGNOSIS — C109 Malignant neoplasm of oropharynx, unspecified: Secondary | ICD-10-CM

## 2022-05-16 DIAGNOSIS — I7121 Aneurysm of the ascending aorta, without rupture: Secondary | ICD-10-CM

## 2022-05-16 DIAGNOSIS — Z950 Presence of cardiac pacemaker: Secondary | ICD-10-CM

## 2022-05-16 DIAGNOSIS — R600 Localized edema: Secondary | ICD-10-CM | POA: Diagnosis not present

## 2022-05-16 DIAGNOSIS — I4729 Other ventricular tachycardia: Secondary | ICD-10-CM | POA: Diagnosis not present

## 2022-05-16 DIAGNOSIS — I350 Nonrheumatic aortic (valve) stenosis: Secondary | ICD-10-CM | POA: Diagnosis not present

## 2022-05-16 DIAGNOSIS — E785 Hyperlipidemia, unspecified: Secondary | ICD-10-CM

## 2022-05-16 DIAGNOSIS — I2510 Atherosclerotic heart disease of native coronary artery without angina pectoris: Secondary | ICD-10-CM | POA: Diagnosis not present

## 2022-05-16 DIAGNOSIS — R7989 Other specified abnormal findings of blood chemistry: Secondary | ICD-10-CM

## 2022-05-16 DIAGNOSIS — C01 Malignant neoplasm of base of tongue: Secondary | ICD-10-CM

## 2022-05-16 NOTE — Telephone Encounter (Addendum)
Saw patient in clinic today for f/u. He has labs planned with oncology tomorrow so did not want to add another stick today. He was noted to have low TSH in the hospital. Can you please call Dr. Rob Hickman office in AM (Many) to see if they would be able to add TSH + free T4 onto labs they're getting tomorrow afternoon? We tried to call their office but they're already closed for the day. Thanks.  Addendum: Dr. Chryl Heck responded via chat and will add the orders. Triage - disregard! Thank you!

## 2022-05-16 NOTE — Progress Notes (Signed)
TSH and T4 ordered per cardiology.  Clarence Andrade

## 2022-05-16 NOTE — Patient Instructions (Addendum)
Medication Instructions:  Your physician recommends that you continue on your current medications as directed. Please refer to the Current Medication list given to you today.  *If you need a refill on your cardiac medications before your next appointment, please call your pharmacy*   Lab Work: None ordered If you have labs (blood work) drawn today and your tests are completely normal, you will receive your results only by: [place] (if you have MyChart) OR A paper copy in the mail If you have any lab test that is abnormal or we need to change your treatment, we will call you to review the results.   Follow-Up: At [facility], you and your health needs are our priority.  As part of our continuing mission to provide you with exceptional heart care, we have created designated Provider Care Teams.  These Care Teams include your primary Cardiologist (physician) and Advanced Practice Providers (APPs -  Physician Assistants and Nurse Practitioners) who all work together to provide you with the care you need, when you need it.   Your next appointment:   09/06/2022 at 8:25 AM  The format for your next appointment:   In Person  Provider:   Melina Copa, PA-C         Important Information About Sugar

## 2022-05-17 ENCOUNTER — Telehealth (HOSPITAL_COMMUNITY): Payer: Self-pay

## 2022-05-17 ENCOUNTER — Other Ambulatory Visit: Payer: Self-pay

## 2022-05-17 ENCOUNTER — Inpatient Hospital Stay (HOSPITAL_BASED_OUTPATIENT_CLINIC_OR_DEPARTMENT_OTHER): Payer: PPO | Admitting: Hematology and Oncology

## 2022-05-17 ENCOUNTER — Inpatient Hospital Stay: Payer: PPO

## 2022-05-17 ENCOUNTER — Encounter: Payer: Self-pay | Admitting: Hematology and Oncology

## 2022-05-17 VITALS — BP 111/67 | HR 91 | Temp 98.1°F | Resp 16 | Ht 72.0 in | Wt 255.7 lb

## 2022-05-17 DIAGNOSIS — C109 Malignant neoplasm of oropharynx, unspecified: Secondary | ICD-10-CM | POA: Diagnosis not present

## 2022-05-17 DIAGNOSIS — C01 Malignant neoplasm of base of tongue: Secondary | ICD-10-CM

## 2022-05-17 DIAGNOSIS — R7989 Other specified abnormal findings of blood chemistry: Secondary | ICD-10-CM

## 2022-05-17 DIAGNOSIS — Z95828 Presence of other vascular implants and grafts: Secondary | ICD-10-CM

## 2022-05-17 LAB — CBC WITH DIFFERENTIAL (CANCER CENTER ONLY)
Abs Immature Granulocytes: 0.02 10*3/uL (ref 0.00–0.07)
Basophils Absolute: 0 10*3/uL (ref 0.0–0.1)
Basophils Relative: 0 %
Eosinophils Absolute: 0.1 10*3/uL (ref 0.0–0.5)
Eosinophils Relative: 2 %
HCT: 27.8 % — ABNORMAL LOW (ref 39.0–52.0)
Hemoglobin: 9.8 g/dL — ABNORMAL LOW (ref 13.0–17.0)
Immature Granulocytes: 0 %
Lymphocytes Relative: 16 %
Lymphs Abs: 0.8 10*3/uL (ref 0.7–4.0)
MCH: 35 pg — ABNORMAL HIGH (ref 26.0–34.0)
MCHC: 35.3 g/dL (ref 30.0–36.0)
MCV: 99.3 fL (ref 80.0–100.0)
Monocytes Absolute: 0.7 10*3/uL (ref 0.1–1.0)
Monocytes Relative: 13 %
Neutro Abs: 3.5 10*3/uL (ref 1.7–7.7)
Neutrophils Relative %: 69 %
Platelet Count: 252 10*3/uL (ref 150–400)
RBC: 2.8 MIL/uL — ABNORMAL LOW (ref 4.22–5.81)
RDW: 19.8 % — ABNORMAL HIGH (ref 11.5–15.5)
WBC Count: 5.1 10*3/uL (ref 4.0–10.5)
nRBC: 0 % (ref 0.0–0.2)

## 2022-05-17 LAB — CMP (CANCER CENTER ONLY)
ALT: 19 U/L (ref 0–44)
AST: 21 U/L (ref 15–41)
Albumin: 3.5 g/dL (ref 3.5–5.0)
Alkaline Phosphatase: 56 U/L (ref 38–126)
Anion gap: 3 — ABNORMAL LOW (ref 5–15)
BUN: 27 mg/dL — ABNORMAL HIGH (ref 8–23)
CO2: 34 mmol/L — ABNORMAL HIGH (ref 22–32)
Calcium: 9.4 mg/dL (ref 8.9–10.3)
Chloride: 97 mmol/L — ABNORMAL LOW (ref 98–111)
Creatinine: 0.77 mg/dL (ref 0.61–1.24)
GFR, Estimated: >60 mL/min (ref >60)
Glucose, Bld: 103 mg/dL — ABNORMAL HIGH (ref 70–99)
Potassium: 4.6 mmol/L (ref 3.5–5.1)
Sodium: 134 mmol/L — ABNORMAL LOW (ref 135–145)
Total Bilirubin: 0.3 mg/dL (ref 0.3–1.2)
Total Protein: 6.2 g/dL — ABNORMAL LOW (ref 6.5–8.1)

## 2022-05-17 LAB — T4, FREE: Free T4: 1.18 ng/dL — ABNORMAL HIGH (ref 0.61–1.12)

## 2022-05-17 LAB — MAGNESIUM: Magnesium: 1.6 mg/dL — ABNORMAL LOW (ref 1.7–2.4)

## 2022-05-17 MED ORDER — SODIUM CHLORIDE 0.9% FLUSH
10.0000 mL | Freq: Once | INTRAVENOUS | Status: AC
  Administered 2022-05-17: 10 mL

## 2022-05-17 MED ORDER — HEPARIN SOD (PORK) LOCK FLUSH 100 UNIT/ML IV SOLN
500.0000 [IU] | Freq: Once | INTRAVENOUS | Status: AC
  Administered 2022-05-17: 500 [IU]

## 2022-05-17 NOTE — Assessment & Plan Note (Addendum)
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  He completed 6 weekly cycles of cisplatin and radiation on December 18.  Soon after he felt extremely weak, was hallucinating, questionable hallucinations related to scopolamine patch, had multiple falls and hence his wife called EMS.    He was admitted and treated for hyponatremia.  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  He was discharged on sodium tablets.  He is here for follow up, continues on sodium tablets, water restriction. Awaiting BMP today. 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   BMP resulted, sodium is sig better, will try to touch base with nephrology again to discuss recommendations. Discussed with Dr Joelyn Oms, stop the sodium tablets, continue free water restriction, encourage protein rich diet and check BMP in a week.

## 2022-05-17 NOTE — Progress Notes (Signed)
Finger NOTE  Patient Care Team: Kathalene Frames, MD as PCP - General (Internal Medicine) Sueanne Margarita, MD as PCP - Cardiology (Cardiology) Constance Haw, MD as PCP - Electrophysiology (Cardiology) Malmfelt, Stephani Police, RN as Oncology Nurse Navigator Benay Pike, MD as Consulting Physician (Hematology and Oncology) [name], MD as Consulting Physician (Radiation Oncology)  CHIEF COMPLAINTS/PURPOSE OF CONSULTATION:  Squamous cell carcinoma of the oropharynx  ASSESSMENT & PLAN:   Squamous cell carcinoma of oropharynx [facility]) This is a very pleasant 77 year old male patient with 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  He completed 6 weekly cycles of cisplatin and radiation on December 18.  Soon after he felt extremely weak, was hallucinating, questionable hallucinations related to scopolamine patch, had multiple falls and hence his wife called EMS.    He was admitted and treated for hyponatremia.  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  He was discharged on sodium tablets.  He is here for follow up, continues on sodium tablets, water restriction. Awaiting BMP today. 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   BMP resulted, sodium is sig better, will try to touch base with nephrology again to discuss recommendations. Discussed with Dr Joelyn Oms, stop the sodium tablets, continue free water restriction, encourage protein rich diet and check BMP in a week.  HISTORY OF PRESENTING ILLNESS:   Clarence Andrade 77 y.o. male is here because of SCC oropharynx.  Oncologic History  This is a very pleasant 77 year old male patient who was seen by ENT with chief complaint of lump in his neck ongoing for the past 1 to 2 months.  Around July he suddenly noticed the lump in the neck.   He was initially treated by antibiotics but this has not changed.  He also has started noticing some difficulty swallowing and some dysgeusia.  He tells me that he has lost about 20 pounds of weight in the past 2 to 3 months but part of it could be intentional.  He was seen by Dr. Sabino Gasser on September 20 for an initial consultation and he had flexible fiberoptic endoscopy the same day which noticed large base of tongue tumor.  This tumor measured at least 2 to 3 cm in diameter extending across midline.  He also had a CT on September 27 which confirmed 4 cm mainly exophytic mass centered at the left base of the tongue with at least 1 ipsilateral malignant node measuring nearly 4 cm with signs of extracapsular extension.  Findings consistent with metastatic squamous cell carcinoma.  PET/CT done on October 13 with no findings of metastatic disease, large left-sided hypermetabolic oropharyngeal neoplasm and adjacent left-sided metastatic lymphadenopathy.  Interval history  Patient is here for follow-up after recent hospitalization.  He apparently had some hallucinations, multiple falls and felt extremely weak hence her his wife called EMS.  He was in the hospital and received some 3% saline and was discharged on sodium tablets, there was some concern for SIADH related to cisplatin versus dehydration.  He continues on free water restriction, sodium tablets. He feels better today, energy is slowly improving. He is able to eat some food, everything tastes like wet cardboard. Rest of the pertinent 10 point ROS reviewed and negative  MEDICAL HISTORY:  Past Medical History:  Diagnosis Date   Abdominal hernia    Midline  Adrenal incidentaloma ([place]) 2010   Aortic stenosis    Ascending aortic aneurysm (HCC)    CAD (coronary artery disease)    a. Cath 03/2018 - CTO of the prox-mid LAD with bridging, L-L and R-L collaterals, moderate, non-obstructive disease involving the proximal LAD, proximal LCx, and  distal RCA, more severe disease noted involving small branch vessels (RV marginal and superior branch of OM3).   Complete heart block (HCC)    Gallstones    Heart murmur    History of pacemaker    a. 11/2018 - [employer], intermittent CHB.   Hyperlipidemia    Morbid obesity (HCC)    Myocardial infarction [facility])    NSVT (nonsustained ventricular tachycardia) (HCC)    OSA (obstructive sleep apnea)    Severe PSG 1/07 AHI 66/hr, O2 Nadir 50% Refuses treatment - Pt does not believe test was accurate   Pericarditis    Pneumonia    Pulmonary HTN (HCC)    mild with PASP 24mHg by echo 02/2018, not seen on 03/2020 echo   Pulmonary nodule    Screening for AAA (abdominal aortic aneurysm) 2010   CT Abd/Pelvis   Squamous cell carcinoma of oropharynx (HCC)    Ventral hernia    03/2009    SURGICAL HISTORY: Past Surgical History:  Procedure Laterality Date   CHOLECYSTECTOMY N/A 10/11/2019   Procedure: LAPAROSCOPIC CHOLECYSTECTOMY WITH INTRAOPERATIVE CHOLANGIOGRAM;  Surgeon: GArmandina Gemma MD;  Location: [facility];  Service: General;  Laterality: N/A;   CORONARY ANGIOGRAPHY N/A 03/23/2018   Procedure: CORONARY ANGIOGRAPHY;  Surgeon: ENelva Bush MD;  [facility];  Service: Cardiovascular;  Laterality: N/A;   HERNIA REPAIR  05/2002   three   IR GASTROSTOMY TUBE MOD SED  03/08/2022   IR GJ TUBE CHANGE  04/15/2022   IR IMAGING GUIDED PORT INSERTION  03/08/2022   KNEE ARTHROSCOPY Left 09/2002   PACEMAKER IMPLANT N/A 11/08/2018   Procedure: PACEMAKER IMPLANT;  Surgeon: CConstance Haw MD;  [facility];  Service: Cardiovascular;  Laterality: N/A;   SHOULDER SURGERY Right 2020   TOTAL KNEE ARTHROPLASTY Bilateral 09/2010   VENTRAL HERNIA REPAIR  03/2009    SOCIAL HISTORY: Social History   Socioeconomic History   Marital status: Married    Spouse name: Not on file   Number of children: Not on file   Years of education: Not on file   Highest education  level: Not on file  Occupational History   Not on file  Tobacco Use   Smoking status: Former    Packs/day: 0.50    Types: Cigarettes    Quit date: 136   Years since quitting: 31.0   Smokeless tobacco: Never  Vaping Use   Vaping Use: Never used  Substance and Sexual Activity   Alcohol use: No   Drug use: No   Sexual activity: Not Currently  Other Topics Concern   Not on file  Social History Narrative   Not on file   Social Determinants of Health   Financial Resource Strain: Low Risk  (02/24/2022)   Overall Financial Resource Strain (CARDIA)    Difficulty of Paying Living Expenses: Not hard at all  Food Insecurity: No Food Insecurity (05/04/2022)   Hunger Vital Sign    Worried About Running Out of Food in the Last Year: Never true    [place] the Last Year: Never true  Transportation Needs: No Transportation Needs (05/04/2022)   [place]- Transportation  Lack of Transportation (Medical): No    Lack of Transportation (Non-Medical): No  Physical Activity: Not on file  Stress: Not on file  Social Connections: Not on file  Intimate Partner Violence: Not At Risk (05/04/2022)   Humiliation, Afraid, Rape, and Kick questionnaire    Fear of Current or Ex-Partner: No    Emotionally Abused: No    Physically Abused: No    Sexually Abused: No    FAMILY HISTORY: Family History  Problem Relation Age of Onset   Heart attack Father    Heart disease Father    Aneurysm Father    Breast cancer Sister    Colon cancer Sister     ALLERGIES:  is allergic to sertraline hcl and transderm-scop [scopolamine].  MEDICATIONS:  Current Outpatient Medications  Medication Sig Dispense Refill   acetaminophen (TYLENOL) 500 MG tablet Place 1,000 mg into feeding tube every 6 (six) hours as needed (for pain).     aspirin EC 81 MG tablet 81 mg See admin instructions. 81 mg, per tube, once a day     buPROPion (WELLBUTRIN XL) 150 MG 24 hr tablet 150 mg See admin instructions. 150 mg,  per tube, every morning     feeding supplement, ENSURE COMPLETE, (ENSURE COMPLETE) LIQD Place 237 mLs into feeding tube See admin instructions. Mix 237 ml's of Ensure Complete with 237 ml's of milk = 3 feedings a day     nitroGLYCERIN (NITROSTAT) 0.4 MG SL tablet Place 1 tablet (0.4 mg total) under the tongue every 5 (five) minutes as needed for chest pain. (Patient not taking: Reported on 05/16/2022) 25 tablet 3   Nutritional Supplements (ISOSOURCE) LIQD Place into feeding tube See admin instructions. Three feedings a day     Protein (FEEDING SUPPLEMENT, PROSOURCE TF20,) liquid Place 60 mLs into feeding tube daily.     rosuvastatin (CRESTOR) 10 MG tablet TAKE 1 TABLET ONCE DAILY. 90 tablet 0   scopolamine (TRANSDERM-SCOP) 1 MG/3DAYS Place 1 patch (1.5 mg total) onto the skin every 3 (three) days. (Patient not taking: Reported on 05/03/2022) 10 patch 2   sodium chloride 1 g tablet Take 2 tablets (2 g total) by mouth 2 (two) times daily with a meal. 30 tablet 3   tamsulosin (FLOMAX) 0.4 MG CAPS capsule Take 0.4 mg by mouth daily.     temazepam (RESTORIL) 15 MG capsule Take 1 capsule (15 mg total) by mouth at bedtime as needed for sleep. 30 capsule 0   Water For Irrigation, Sterile (FREE WATER) SOLN Place 200 mLs into feeding tube every 8 (eight) hours.     No current facility-administered medications for this visit.     PHYSICAL EXAMINATION: ECOG PERFORMANCE STATUS: 0 - Asymptomatic  Vitals:   05/17/22 1528  BP: 111/67  Pulse: 91  Resp: 16  Temp: 98.1 F (36.7 C)  SpO2: 98%   Filed Weights   05/17/22 1528  Weight: 255 lb 11.2 oz (116 kg)    Physical Exam Constitutional:      General: He is not in acute distress.    Appearance: Normal appearance.  HENT:     Mouth/Throat:     Mouth: Mucous membranes are moist.  Cardiovascular:     Rate and Rhythm: Normal rate and regular rhythm.     Pulses: Normal pulses.     Heart sounds: Normal heart sounds.  Musculoskeletal:        General:  Swelling (BLE swelling 1+, improving) present. Normal range of motion.     Cervical  back: Normal range of motion and neck supple. No rigidity.  Lymphadenopathy:     Cervical: No cervical adenopathy (No palpable cervical lymphadenopathy on exam consistent with complete clinical response).  Skin:    General: Skin is warm and dry.  Neurological:     Mental Status: He is alert.    LABORATORY DATA:  I have reviewed the data as listed Lab Results  Component Value Date   WBC 5.1 05/17/2022   HGB 9.8 (L) 05/17/2022   HCT 27.8 (L) 05/17/2022   MCV 99.3 05/17/2022   PLT 252 05/17/2022     Chemistry      Component Value Date/Time   NA 134 (L) 05/17/2022 1500   NA 140 10/13/2021 0954   K 4.6 05/17/2022 1500   CL 97 (L) 05/17/2022 1500   CO2 34 (H) 05/17/2022 1500   BUN 27 (H) 05/17/2022 1500   BUN 20 10/13/2021 0954   CREATININE 0.77 05/17/2022 1500      Component Value Date/Time   CALCIUM 9.4 05/17/2022 1500   ALKPHOS 56 05/17/2022 1500   AST 21 05/17/2022 1500   ALT 19 05/17/2022 1500   BILITOT 0.3 05/17/2022 1500       RADIOGRAPHIC STUDIES: I have personally reviewed the radiological images as listed and agreed with the findings in the report. CUP PACEART REMOTE DEVICE CHECK  Result Date: 05/07/2022 Scheduled remote reviewed. Normal device function.  Next remote 91 days. Kathy Breach, RN, CCDS, [employer]  DG CHEST PORT 1 VIEW  Result Date: 05/05/2022 CLINICAL DATA:  Shortness of breath EXAM: PORTABLE CHEST 1 VIEW COMPARISON:  05/03/2022 FINDINGS: Left pacer and right Port-A-Cath remain in place, unchanged. Heart and mediastinal contours are within normal limits. No focal opacities or effusions. No acute bony abnormality. IMPRESSION: No active cardiopulmonary disease. Electronically Signed   By: Rolm Baptise M.D.   On: 05/05/2022 15:22   ECHOCARDIOGRAM COMPLETE  Result Date: 05/04/2022    ECHOCARDIOGRAM REPORT   Patient Name:   Clarence Andrade Date of Exam:  05/04/2022 Medical Rec #:  [id number]       Height:       72.0 in Accession #:    [id number]      Weight:       268.0 lb Date of Birth:  Mar 23, 1946       BSA:          2.413 m Patient Age:    19 years        BP:           131/96 mmHg Patient Gender: M               HR:           99 bpm. Exam Location:  Inpatient Procedure: 2D Echo and Intracardiac Opacification Agent Indications:    CHF  History:        Patient has prior history of Echocardiogram examinations, most                 recent 03/10/2020. Signs/Symptoms:Murmur.  Sonographer:    Harvie Junior Referring Phys: [id number] FANG XU  Sonographer Comments: Technically difficult study due to poor echo windows and patient is obese. Image acquisition challenging due to patient body habitus and Image acquisition challenging due to respiratory motion. IMPRESSIONS  1. Limited study due to poor echo windows.  2. Left ventricular ejection fraction, by estimation, is 55 to 60%. The left ventricle has normal function. The left ventricle has no  regional wall motion abnormalities. There is mild concentric left ventricular hypertrophy. Left ventricular diastolic parameters are indeterminate.  3. Right ventricular systolic function is normal. The right ventricular size is not well visualized. There is normal pulmonary artery systolic pressure.  4. The mitral valve is grossly normal. Trivial mitral valve regurgitation. No evidence of mitral stenosis.  5. There is at least mild aortic stenosis and mean gradient may be underestimated due to suboptimal doppler angle of interrogation for both LVOT VTI and AoV VTI. The aortic valve is calcified. Aortic valve regurgitation is not visualized. There is at least mild aortic stenosis. Aortic valve mean gradient measures 15.7 mmHg. Aortic valve Vmax measures 2.53 m/s.  6. Aortic dilatation noted. There is borderline dilatation of the aortic root, measuring 40 mm. Comparison(s): Compared to prior TTE in 2021, there continues to be at least mild  AS (prior mean gradient 17mHg). Otherwise, there is no significant change. FINDINGS  Left Ventricle: Left ventricular ejection fraction, by estimation, is 55 to 60%. The left ventricle has normal function. The left ventricle has no regional wall motion abnormalities. The left ventricular internal cavity size was normal in size. There is  mild concentric left ventricular hypertrophy. Left ventricular diastolic parameters are indeterminate. Right Ventricle: The right ventricular size is not well visualized. Right vetricular wall thickness was not well visualized. Right ventricular systolic function is normal. There is normal pulmonary artery systolic pressure. The tricuspid regurgitant velocity is 2.77 m/s, and with an assumed right atrial pressure of 3 mmHg, the estimated right ventricular systolic pressure is 377.1mmHg. Left Atrium: Left atrial size was not well visualized. Right Atrium: Right atrial size was not well visualized. Pericardium: There is no evidence of pericardial effusion. Mitral Valve: The mitral valve is grossly normal. There is mild thickening of the mitral valve leaflet(s). There is mild calcification of the mitral valve leaflet(s). Trivial mitral valve regurgitation. No evidence of mitral valve stenosis. Tricuspid Valve: The tricuspid valve is grossly normal. Tricuspid valve regurgitation is trivial. Aortic Valve: There is at least mild aortic stenosis and mean gradient may be underestimated due to suboptimal doppler angle of interrogation for both LVOT VTI and AoV VTI. The aortic valve is calcified. Aortic valve regurgitation is not visualized. There is at least mild aortic stenosis. Aortic valve mean gradient measures 15.7 mmHg. Aortic valve peak gradient measures 25.6 mmHg. Aortic valve area, by VTI measures 1.74 cm. Pulmonic Valve: The pulmonic valve was not well visualized. Aorta: Aortic dilatation noted. There is borderline dilatation of the aortic root, measuring 40 mm. IAS/Shunts: The  atrial septum is grossly normal.  LEFT VENTRICLE PLAX 2D LVIDd:         4.20 cm      Diastology LVIDs:         2.60 cm      LV e' medial:    9.90 cm/s LV PW:         1.30 cm      LV E/e' medial:  8.7 LV IVS:        1.30 cm      LV e' lateral:   11.40 cm/s LVOT diam:     2.30 cm      LV E/e' lateral: 7.6 LV SV:         66 LV SV Index:   28 LVOT Area:     4.15 cm  LV Volumes (MOD) LV vol d, MOD A4C: 104.0 ml LV vol s, MOD A4C: 50.1 ml LV SV MOD A4C:  104.0 ml RIGHT VENTRICLE RV S prime:     15.10 cm/s TAPSE (M-mode): 2.1 cm LEFT ATRIUM             Index        RIGHT ATRIUM           Index LA Vol (A2C):   57.2 ml 23.71 ml/m  RA Area:     17.40 cm LA Vol (A4C):   62.0 ml 25.70 ml/m  RA Volume:   47.50 ml  19.69 ml/m LA Biplane Vol: 60.4 ml 25.03 ml/m  AORTIC VALVE                     PULMONIC VALVE AV Area (Vmax):    1.44 cm      PV Vmax:       1.37 m/s AV Area (Vmean):   1.79 cm      PV Peak grad:  7.5 mmHg AV Area (VTI):     1.74 cm AV Vmax:           252.80 cm/s AV Vmean:          163.667 cm/s AV VTI:            0.382 m AV Peak Grad:      25.6 mmHg AV Mean Grad:      15.7 mmHg LVOT Vmax:         87.50 cm/s LVOT Vmean:        70.500 cm/s LVOT VTI:          0.160 m LVOT/AV VTI ratio: 0.42  AORTA Ao Root diam: 3.60 cm MITRAL VALVE               TRICUSPID VALVE MV Area (PHT): 6.17 cm    TR Peak grad:   30.7 mmHg MV Decel Time: 123 msec    TR Vmax:        277.00 cm/s MR Peak grad: 76.2 mmHg MR Vmax:      436.33 cm/s  SHUNTS MV E velocity: 86.40 cm/s  Systemic VTI:  0.16 m MV A velocity: 50.30 cm/s  Systemic Diam: 2.30 cm MV E/A ratio:  1.72 Gwyndolyn Kaufman MD Electronically signed by Gwyndolyn Kaufman MD Signature Date/Time: 05/04/2022/4:45:28 PM    Final    VAS [place] LOWER EXTREMITY VENOUS (DVT)  Result Date: 05/03/2022  Lower Venous DVT Study Patient Name:  Clarence Andrade  Date of Exam:   05/03/2022 Medical Rec #: [id number]        Accession #:    [id number] Date of Birth: 02/05/1946        Patient  Gender: M Patient Age:   36 years Exam Location:  [facility] Procedure:      VAS [place] LOWER EXTREMITY VENOUS (DVT) Referring Phys: Annamaria Boots XU --------------------------------------------------------------------------------  Indications: Edema.  Risk Factors: Cancer. Limitations: Poor ultrasound/tissue interface. Comparison Study: No prior studies. Performing Technologist: Oliver Hum RVT  Examination Guidelines: A complete evaluation includes B-mode imaging, spectral Doppler, color Doppler, and power Doppler as needed of all accessible portions of each vessel. Bilateral testing is considered an integral part of a complete examination. Limited examinations for reoccurring indications may be performed as noted. The reflux portion of the exam is performed with the patient in reverse Trendelenburg.  +---------+---------------+---------+-----------+----------+--------------+ RIGHT    CompressibilityPhasicitySpontaneityPropertiesThrombus Aging +---------+---------------+---------+-----------+----------+--------------+ CFV      Full           Yes      Yes                                 +---------+---------------+---------+-----------+----------+--------------+  SFJ      Full                                                        +---------+---------------+---------+-----------+----------+--------------+ FV Prox  Full                                                        +---------+---------------+---------+-----------+----------+--------------+ FV Mid   Full                                                        +---------+---------------+---------+-----------+----------+--------------+ FV DistalFull                                                        +---------+---------------+---------+-----------+----------+--------------+ PFV      Full                                                         +---------+---------------+---------+-----------+----------+--------------+ POP      Full           Yes      Yes                                 +---------+---------------+---------+-----------+----------+--------------+ PTV      Full                                                        +---------+---------------+---------+-----------+----------+--------------+ PERO     Full                                                        +---------+---------------+---------+-----------+----------+--------------+   +---------+---------------+---------+-----------+----------+-------------------+ LEFT     CompressibilityPhasicitySpontaneityPropertiesThrombus Aging      +---------+---------------+---------+-----------+----------+-------------------+ CFV      Full           Yes      Yes                                      +---------+---------------+---------+-----------+----------+-------------------+ SFJ      Full                                                             +---------+---------------+---------+-----------+----------+-------------------+  FV Prox  Full                                                             +---------+---------------+---------+-----------+----------+-------------------+ FV Mid   Full                                                             +---------+---------------+---------+-----------+----------+-------------------+ FV DistalFull                                                             +---------+---------------+---------+-----------+----------+-------------------+ PFV      Full                                                             +---------+---------------+---------+-----------+----------+-------------------+ POP      Full           Yes      Yes                                      +---------+---------------+---------+-----------+----------+-------------------+ PTV      Full                                                              +---------+---------------+---------+-----------+----------+-------------------+ PERO                                                  Not well visualized +---------+---------------+---------+-----------+----------+-------------------+     Summary: RIGHT: - There is no evidence of deep vein thrombosis in the lower extremity. However, portions of this examination were limited- see technologist comments above.  - No cystic structure found in the popliteal fossa.  LEFT: - There is no evidence of deep vein thrombosis in the lower extremity. However, portions of this examination were limited- see technologist comments above.  - No cystic structure found in the popliteal fossa.  *See table(s) above for measurements and observations. Electronically signed by Jamelle Haring on 05/03/2022 at 4:33:57 PM.    Final    CT Head Wo Contrast  Result Date: 05/03/2022 CLINICAL DATA:  Head trauma, moderate-severe; Neck trauma (Age >= 65y). Multiple recent falls. Currently undergoing treatment for oropharyngeal cancer. EXAM: CT HEAD WITHOUT CONTRAST CT CERVICAL SPINE WITHOUT CONTRAST TECHNIQUE: Multidetector CT imaging of the head and cervical spine was performed following the  standard protocol without intravenous contrast. Multiplanar CT image reconstructions of the cervical spine were also generated. RADIATION DOSE REDUCTION: This exam was performed according to the departmental dose-optimization program which includes automated exposure control, adjustment of the mA and/or kV according to patient size and/or use of iterative reconstruction technique. COMPARISON:  Neck CT 02/02/2022 FINDINGS: CT HEAD FINDINGS Brain: There is no evidence of an acute infarct, intracranial hemorrhage, mass, midline shift, or extra-axial fluid collection. The ventricles and sulci are within normal limits for age. Vascular: Calcified atherosclerosis at the skull base. No hyperdense vessel. Skull: No  acute fracture or suspicious osseous lesion. Sinuses/Orbits: Mild right ethmoid air cell mucosal thickening. Clear mastoid air cells. Unremarkable orbits. Other: None. CT CERVICAL SPINE FINDINGS Alignment: Normal. Skull base and vertebrae: No acute fracture or suspicious osseous lesion. Soft tissues and spinal canal: No prevertebral fluid or swelling. No visible canal hematoma. Disc levels: Moderate cervical spondylosis and mild-to-moderate facet arthrosis. Multilevel neural foraminal stenosis due to uncovertebral spurring, moderate to severe on the right greater than left at C5-6, left greater than right at C6-7, and on the right at C7-T1. Upper chest: Clear lung apices. Other: Post treatment changes in the neck with mucosal edema. Decreased size of the left level II nodal mass and decreased size or possible resolution of the left-sided oropharyngeal mass, not primarily evaluated on this study. IMPRESSION: No evidence of acute intracranial abnormality or cervical spine fracture. Electronically Signed   By: Logan Bores M.D.   On: 05/03/2022 10:38   CT Cervical Spine Wo Contrast  Result Date: 05/03/2022 CLINICAL DATA:  Head trauma, moderate-severe; Neck trauma (Age >= 65y). Multiple recent falls. Currently undergoing treatment for oropharyngeal cancer. EXAM: CT HEAD WITHOUT CONTRAST CT CERVICAL SPINE WITHOUT CONTRAST TECHNIQUE: Multidetector CT imaging of the head and cervical spine was performed following the standard protocol without intravenous contrast. Multiplanar CT image reconstructions of the cervical spine were also generated. RADIATION DOSE REDUCTION: This exam was performed according to the departmental dose-optimization program which includes automated exposure control, adjustment of the mA and/or kV according to patient size and/or use of iterative reconstruction technique. COMPARISON:  Neck CT 02/02/2022 FINDINGS: CT HEAD FINDINGS Brain: There is no evidence of an acute infarct, intracranial  hemorrhage, mass, midline shift, or extra-axial fluid collection. The ventricles and sulci are within normal limits for age. Vascular: Calcified atherosclerosis at the skull base. No hyperdense vessel. Skull: No acute fracture or suspicious osseous lesion. Sinuses/Orbits: Mild right ethmoid air cell mucosal thickening. Clear mastoid air cells. Unremarkable orbits. Other: None. CT CERVICAL SPINE FINDINGS Alignment: Normal. Skull base and vertebrae: No acute fracture or suspicious osseous lesion. Soft tissues and spinal canal: No prevertebral fluid or swelling. No visible canal hematoma. Disc levels: Moderate cervical spondylosis and mild-to-moderate facet arthrosis. Multilevel neural foraminal stenosis due to uncovertebral spurring, moderate to severe on the right greater than left at C5-6, left greater than right at C6-7, and on the right at C7-T1. Upper chest: Clear lung apices. Other: Post treatment changes in the neck with mucosal edema. Decreased size of the left level II nodal mass and decreased size or possible resolution of the left-sided oropharyngeal mass, not primarily evaluated on this study. IMPRESSION: No evidence of acute intracranial abnormality or cervical spine fracture. Electronically Signed   By: Logan Bores M.D.   On: 05/03/2022 10:38   DG Chest 2 View  Result Date: 05/03/2022 CLINICAL DATA:  Cough, weakness EXAM: CHEST - 2 VIEW COMPARISON:  11/08/2018 FINDINGS: Right IJ approach  chest port with distal tip terminating the level of the distal SVC. Left-sided implanted cardiac device remains in place. Cardiomegaly. No focal airspace consolidation, pleural effusion, or pneumothorax. IMPRESSION: No active cardiopulmonary disease. Electronically Signed   By: Davina Poke D.O.   On: 05/03/2022 10:25    All questions were answered. The patient knows to call the clinic with any problems, questions or concerns. I spent 30 minutes in the care of this patient including H and P, review of  records, counseling and coordination of care.     Benay Pike, MD 05/17/2022 4:12 PM

## 2022-05-18 ENCOUNTER — Telehealth: Payer: Self-pay | Admitting: Hematology and Oncology

## 2022-05-18 ENCOUNTER — Telehealth: Payer: Self-pay | Admitting: Physician Assistant

## 2022-05-18 DIAGNOSIS — I959 Hypotension, unspecified: Secondary | ICD-10-CM

## 2022-05-18 LAB — TSH: TSH: 0.979 u[IU]/mL (ref 0.350–4.500)

## 2022-05-18 MED ORDER — MIDODRINE HCL 2.5 MG PO TABS: 2.5000 mg | ORAL_TABLET | Freq: Two times a day (BID) | ORAL | 5 refills | Status: DC

## 2022-05-18 NOTE — Telephone Encounter (Signed)
Pt called back per message received in [facility] Triage.   Per Ms.Dunn, PA-C orders were:  Received update from oncologist Dr. Chryl Heck that patient would like to try midodrine as we had discussed in recent OV.  Recommend starting 2.'5mg'$  BID. If he continues to have issues with low BP we can increase to TID but since he historically has been sensitive to medicines, let's start BID. Triage, can you please send in rx and let pt know? Please have them submit list of BP readings in 1 week for our review. Notify if BPs begin trending 964 systolic or higher.     Pt called and made aware of midodrine 2.5 mg BID script sent to his Pharmacy.  Pt educated on what it is, and why Ms. Dunn PA-C using for his POC. Pt advised to track his blood pressures for 1 week, and write them down in a log book-  AFTER taking his midodrine. Pt stated he will, then MyChart message all BP information to [place].    Per Ms. Dunn, PA-C, we will notify her and Pt if BP trends 383 systolic or higher.    Pt understood all instructions, Midodrine ordered.

## 2022-05-18 NOTE — Telephone Encounter (Signed)
Left message for patient to call back. (Both numbers)

## 2022-05-18 NOTE — Telephone Encounter (Signed)
Received update from oncologist Dr. Chryl Heck that patient would like to try midodrine as we had discussed in recent OV.  Recommend starting 2.'5mg'$  BID. If he continues to have issues with low BP we can increase to TID but since he historically has been sensitive to medicines, let's start BID. Triage, can you please send in rx and let pt know? Please have them submit list of BP readings in 1 week for our review. Notify if BPs begin trending 073 systolic or higher. Thanks.

## 2022-05-18 NOTE — Telephone Encounter (Signed)
Contacted patient to scheduled appointments. Left message with appointment details and a call back number if patient had any questions or could not accommodate the time we provided.   

## 2022-05-18 NOTE — Telephone Encounter (Signed)
Pt returning call

## 2022-05-20 ENCOUNTER — Other Ambulatory Visit (HOSPITAL_COMMUNITY): Payer: PPO | Admitting: Dentistry

## 2022-05-24 NOTE — Progress Notes (Signed)
Remote pacemaker transmission.   

## 2022-05-25 ENCOUNTER — Encounter: Payer: Self-pay | Admitting: Hematology and Oncology

## 2022-05-25 ENCOUNTER — Other Ambulatory Visit: Payer: Self-pay | Admitting: *Deleted

## 2022-05-25 ENCOUNTER — Inpatient Hospital Stay: Payer: PPO

## 2022-05-25 ENCOUNTER — Inpatient Hospital Stay (HOSPITAL_BASED_OUTPATIENT_CLINIC_OR_DEPARTMENT_OTHER): Payer: PPO | Admitting: Hematology and Oncology

## 2022-05-25 VITALS — BP 111/71 | HR 87 | Temp 97.9°F | Resp 16 | Ht 72.0 in | Wt 251.7 lb

## 2022-05-25 DIAGNOSIS — C01 Malignant neoplasm of base of tongue: Secondary | ICD-10-CM | POA: Diagnosis not present

## 2022-05-25 DIAGNOSIS — C109 Malignant neoplasm of oropharynx, unspecified: Secondary | ICD-10-CM

## 2022-05-25 DIAGNOSIS — Z95828 Presence of other vascular implants and grafts: Secondary | ICD-10-CM

## 2022-05-25 DIAGNOSIS — R7989 Other specified abnormal findings of blood chemistry: Secondary | ICD-10-CM

## 2022-05-25 LAB — CMP (CANCER CENTER ONLY)
ALT: 19 U/L (ref 0–44)
AST: 26 U/L (ref 15–41)
Albumin: 3.3 g/dL — ABNORMAL LOW (ref 3.5–5.0)
Alkaline Phosphatase: 61 U/L (ref 38–126)
Anion gap: 13 (ref 5–15)
BUN: 27 mg/dL — ABNORMAL HIGH (ref 8–23)
CO2: 24 mmol/L (ref 22–32)
Calcium: 8.9 mg/dL (ref 8.9–10.3)
Chloride: 98 mmol/L (ref 98–111)
Creatinine: 0.89 mg/dL (ref 0.61–1.24)
GFR, Estimated: >60 mL/min (ref >60)
Glucose, Bld: 122 mg/dL — ABNORMAL HIGH (ref 70–99)
Potassium: 4.7 mmol/L (ref 3.5–5.1)
Sodium: 135 mmol/L (ref 135–145)
Total Bilirubin: 0.4 mg/dL (ref 0.3–1.2)
Total Protein: 5.9 g/dL — ABNORMAL LOW (ref 6.5–8.1)

## 2022-05-25 LAB — CBC WITH DIFFERENTIAL (CANCER CENTER ONLY)
Abs Immature Granulocytes: 0.01 10*3/uL (ref 0.00–0.07)
Basophils Absolute: 0 10*3/uL (ref 0.0–0.1)
Basophils Relative: 1 %
Eosinophils Absolute: 0.2 10*3/uL (ref 0.0–0.5)
Eosinophils Relative: 4 %
HCT: 31 % — ABNORMAL LOW (ref 39.0–52.0)
Hemoglobin: 10.6 g/dL — ABNORMAL LOW (ref 13.0–17.0)
Immature Granulocytes: 0 %
Lymphocytes Relative: 18 %
Lymphs Abs: 1 10*3/uL (ref 0.7–4.0)
MCH: 34.3 pg — ABNORMAL HIGH (ref 26.0–34.0)
MCHC: 34.2 g/dL (ref 30.0–36.0)
MCV: 100.3 fL — ABNORMAL HIGH (ref 80.0–100.0)
Monocytes Absolute: 0.7 10*3/uL (ref 0.1–1.0)
Monocytes Relative: 13 %
Neutro Abs: 3.5 10*3/uL (ref 1.7–7.7)
Neutrophils Relative %: 64 %
Platelet Count: 218 10*3/uL (ref 150–400)
RBC: 3.09 MIL/uL — ABNORMAL LOW (ref 4.22–5.81)
RDW: 18.1 % — ABNORMAL HIGH (ref 11.5–15.5)
WBC Count: 5.5 10*3/uL (ref 4.0–10.5)
nRBC: 0 % (ref 0.0–0.2)

## 2022-05-25 MED ORDER — SODIUM CHLORIDE 0.9% FLUSH
10.0000 mL | Freq: Once | INTRAVENOUS | Status: AC
  Administered 2022-05-25: 10 mL

## 2022-05-25 MED ORDER — HEPARIN SOD (PORK) LOCK FLUSH 100 UNIT/ML IV SOLN
500.0000 [IU] | Freq: Once | INTRAVENOUS | Status: AC
  Administered 2022-05-25: 500 [IU]

## 2022-05-25 NOTE — Assessment & Plan Note (Addendum)
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  He completed 6 weekly cycles of cisplatin and radiation on December 18.  Soon after he felt extremely weak, was hallucinating, questionable hallucinations related to scopolamine patch, had multiple falls and hence his wife called EMS.    He was admitted and treated for hyponatremia.  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  He was discharged on sodium tablets.  His sodium improved significantly. He is here for follow up. He continues to improve and feels remarkably well. His BP is also improving although midodrine hasn't been as helpful. He will follow up with cardiology.  His sodium today has normalized.  He will continue to increase oral intake as tolerated.  If he is able to maintain his weight without using the G-tube for 2 weeks, we will consider removing the G-tube. Return to clinic in 4 weeks

## 2022-05-25 NOTE — Progress Notes (Signed)
[facility] NOTE  Patient Care Team: Kathalene Frames, MD as PCP - General (Internal Medicine) Sueanne Margarita, MD as PCP - Cardiology (Cardiology) Constance Haw, MD as PCP - Electrophysiology (Cardiology) Malmfelt, Stephani Police, RN as Oncology Nurse Navigator Benay Pike, MD as Consulting Physician (Hematology and Oncology) [name], MD as Consulting Physician (Radiation Oncology)  CHIEF COMPLAINTS/PURPOSE OF CONSULTATION:  Squamous cell carcinoma of the oropharynx  ASSESSMENT & PLAN:   Squamous cell carcinoma of oropharynx [facility]) This is a very pleasant 77 year old male patient with 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  He completed 6 weekly cycles of cisplatin and radiation on December 18.  Soon after he felt extremely weak, was hallucinating, questionable hallucinations related to scopolamine patch, had multiple falls and hence his wife called EMS.    He was admitted and treated for hyponatremia.  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  He was discharged on sodium tablets.  His sodium improved significantly. He is here for follow up. He continues to improve and feels remarkably well. His BP is also improving although midodrine hasn't been as helpful. He will follow up with cardiology.  His sodium today has normalized.  He will continue to increase oral intake as tolerated.  If he is able to maintain his weight without using the G-tube for 2 weeks, we will consider removing the G-tube. Return to clinic in 4 weeks  HISTORY OF PRESENTING ILLNESS:   Clarence Andrade 77 y.o. male is here because of SCC oropharynx.  Oncologic History  This is a very pleasant 77 year old male patient who was seen by ENT with chief complaint of lump in his neck ongoing for the past 1 to 2 months.  Around July he  suddenly noticed the lump in the neck.  He was initially treated by antibiotics but this has not changed.  He also has started noticing some difficulty swallowing and some dysgeusia.  He tells me that he has lost about 20 pounds of weight in the past 2 to 3 months but part of it could be intentional.  He was seen by Dr. Sabino Gasser on September 20 for an initial consultation and he had flexible fiberoptic endoscopy the same day which noticed large base of tongue tumor.  This tumor measured at least 2 to 3 cm in diameter extending across midline.  He also had a CT on September 27 which confirmed 4 cm mainly exophytic mass centered at the left base of the tongue with at least 1 ipsilateral malignant node measuring nearly 4 cm with signs of extracapsular extension.  Findings consistent with metastatic squamous cell carcinoma.  PET/CT done on October 13 with no findings of metastatic disease, large left-sided hypermetabolic oropharyngeal neoplasm and adjacent left-sided metastatic lymphadenopathy.  Interval history  Patient is here for follow-up.  He feels better today, energy is slowly improving. He is able to eat some food, has not been using the G-tube.  He lost about 4 pounds of weight since his last visit.  He is however able to exercise more, using his recumbent bike for 25 to 30 minutes a day.  He is anxious to have the G-tube out so he can start doing his aquatic exercises again. Rest of the pertinent 10 point ROS reviewed and negative  Wt Readings from Last 3 Encounters:  05/25/22 251 lb 11.2 oz (114.2  kg)  05/17/22 255 lb 11.2 oz (116 kg)  05/16/22 256 lb 12.8 oz (116.5 kg)     MEDICAL HISTORY:  Past Medical History:  Diagnosis Date   Abdominal hernia    Midline   Adrenal incidentaloma ([place]) 2010   Aortic stenosis    Ascending aortic aneurysm (HCC)    CAD (coronary artery disease)    a. Cath 03/2018 - CTO of the prox-mid LAD with bridging, L-L and R-L collaterals, moderate,  non-obstructive disease involving the proximal LAD, proximal LCx, and distal RCA, more severe disease noted involving small branch vessels (RV marginal and superior branch of OM3).   Complete heart block (HCC)    Gallstones    Heart murmur    History of pacemaker    a. 11/2018 - [employer], intermittent CHB.   Hyperlipidemia    Morbid obesity (HCC)    Myocardial infarction [facility])    NSVT (nonsustained ventricular tachycardia) (HCC)    OSA (obstructive sleep apnea)    Severe PSG 1/07 AHI 66/hr, O2 Nadir 50% Refuses treatment - Pt does not believe test was accurate   Pericarditis    Pneumonia    Pulmonary HTN (HCC)    mild with PASP 1mHg by echo 02/2018, not seen on 03/2020 echo   Pulmonary nodule    Screening for AAA (abdominal aortic aneurysm) 2010   CT Abd/Pelvis   Squamous cell carcinoma of oropharynx (HCC)    Ventral hernia    03/2009    SURGICAL HISTORY: Past Surgical History:  Procedure Laterality Date   CHOLECYSTECTOMY N/A 10/11/2019   Procedure: LAPAROSCOPIC CHOLECYSTECTOMY WITH INTRAOPERATIVE CHOLANGIOGRAM;  Surgeon: GArmandina Gemma MD;  Location: [facility];  Service: General;  Laterality: N/A;   CORONARY ANGIOGRAPHY N/A 03/23/2018   Procedure: CORONARY ANGIOGRAPHY;  Surgeon: ENelva Bush MD;  [facility];  Service: Cardiovascular;  Laterality: N/A;   HERNIA REPAIR  05/2002   three   IR GASTROSTOMY TUBE MOD SED  03/08/2022   IR GJ TUBE CHANGE  04/15/2022   IR IMAGING GUIDED PORT INSERTION  03/08/2022   KNEE ARTHROSCOPY Left 09/2002   PACEMAKER IMPLANT N/A 11/08/2018   Procedure: PACEMAKER IMPLANT;  Surgeon: CConstance Haw MD;  Location: [facility];  Service: Cardiovascular;  Laterality: N/A;   SHOULDER SURGERY Right 2020   TOTAL KNEE ARTHROPLASTY Bilateral 09/2010   VENTRAL HERNIA REPAIR  03/2009    SOCIAL HISTORY: Social History   Socioeconomic History   Marital status: Married    Spouse name: Not on file   Number of children:  Not on file   Years of education: Not on file   Highest education level: Not on file  Occupational History   Not on file  Tobacco Use   Smoking status: Former    Packs/day: 0.50    Types: Cigarettes    Quit date: 160   Years since quitting: 31.0   Smokeless tobacco: Never  Vaping Use   Vaping Use: Never used  Substance and Sexual Activity   Alcohol use: No   Drug use: No   Sexual activity: Not Currently  Other Topics Concern   Not on file  Social History Narrative   Not on file   Social Determinants of Health   Financial Resource Strain: Low Risk  (02/24/2022)   Overall Financial Resource Strain (CARDIA)    Difficulty of Paying Living Expenses: Not hard at all  Food Insecurity: No Food Insecurity (05/04/2022)   Hunger Vital Sign  Worried About Charity fundraiser in the Last Year: Never true    [place] in the Last Year: Never true  Transportation Needs: No Transportation Needs (05/04/2022)   PRAPARE - Hydrologist (Medical): No    Lack of Transportation (Non-Medical): No  Physical Activity: Not on file  Stress: Not on file  Social Connections: Not on file  Intimate Partner Violence: Not At Risk (05/04/2022)   Humiliation, Afraid, Rape, and Kick questionnaire    Fear of Current or Ex-Partner: No    Emotionally Abused: No    Physically Abused: No    Sexually Abused: No    FAMILY HISTORY: Family History  Problem Relation Age of Onset   Heart attack Father    Heart disease Father    Aneurysm Father    Breast cancer Sister    Colon cancer Sister     ALLERGIES:  is allergic to sertraline hcl and transderm-scop [scopolamine].  MEDICATIONS:  Current Outpatient Medications  Medication Sig Dispense Refill   acetaminophen (TYLENOL) 500 MG tablet Place 1,000 mg into feeding tube every 6 (six) hours as needed (for pain).     aspirin EC 81 MG tablet 81 mg See admin instructions. 81 mg, per tube, once a day     buPROPion  (WELLBUTRIN XL) 150 MG 24 hr tablet 150 mg See admin instructions. 150 mg, per tube, every morning     feeding supplement, ENSURE COMPLETE, (ENSURE COMPLETE) LIQD Place 237 mLs into feeding tube See admin instructions. Mix 237 ml's of Ensure Complete with 237 ml's of milk = 3 feedings a day     midodrine (PROAMATINE) 2.5 MG tablet Take 1 tablet (2.5 mg total) by mouth 2 (two) times daily with a meal. 30 tablet 5   nitroGLYCERIN (NITROSTAT) 0.4 MG SL tablet Place 1 tablet (0.4 mg total) under the tongue every 5 (five) minutes as needed for chest pain. (Patient not taking: Reported on 05/16/2022) 25 tablet 3   Nutritional Supplements (ISOSOURCE) LIQD Place into feeding tube See admin instructions. Three feedings a day     Protein (FEEDING SUPPLEMENT, PROSOURCE TF20,) liquid Place 60 mLs into feeding tube daily.     rosuvastatin (CRESTOR) 10 MG tablet TAKE 1 TABLET ONCE DAILY. 90 tablet 0   tamsulosin (FLOMAX) 0.4 MG CAPS capsule Take 0.4 mg by mouth daily.     temazepam (RESTORIL) 15 MG capsule Take 1 capsule (15 mg total) by mouth at bedtime as needed for sleep. 30 capsule 0   Water For Irrigation, Sterile (FREE WATER) SOLN Place 200 mLs into feeding tube every 8 (eight) hours.     No current facility-administered medications for this visit.     PHYSICAL EXAMINATION: ECOG PERFORMANCE STATUS: 0 - Asymptomatic  Vitals:   05/25/22 1446  BP: 111/71  Pulse: 87  Resp: 16  Temp: 97.9 F (36.6 C)  SpO2: 98%   Filed Weights   05/25/22 1446  Weight: 251 lb 11.2 oz (114.2 kg)    Physical Exam Constitutional:      General: He is not in acute distress.    Appearance: Normal appearance.  Musculoskeletal:        General: Swelling: BLE swelling 1+, improving.  Skin:    General: Skin is warm and dry.  Neurological:     Mental Status: He is alert.    LABORATORY DATA:  I have reviewed the data as listed Lab Results  Component Value Date   WBC 5.5  05/25/2022   HGB 10.6 (L) 05/25/2022   HCT  31.0 (L) 05/25/2022   MCV 100.3 (H) 05/25/2022   PLT 218 05/25/2022     Chemistry      Component Value Date/Time   NA 135 05/25/2022 1412   NA 140 10/13/2021 0954   K 4.7 05/25/2022 1412   CL 98 05/25/2022 1412   CO2 24 05/25/2022 1412   BUN 27 (H) 05/25/2022 1412   BUN 20 10/13/2021 0954   CREATININE 0.89 05/25/2022 1412      Component Value Date/Time   CALCIUM 8.9 05/25/2022 1412   ALKPHOS 61 05/25/2022 1412   AST 26 05/25/2022 1412   ALT 19 05/25/2022 1412   BILITOT 0.4 05/25/2022 1412       RADIOGRAPHIC STUDIES: I have personally reviewed the radiological images as listed and agreed with the findings in the report. CUP PACEART REMOTE DEVICE CHECK  Result Date: 05/07/2022 Scheduled remote reviewed. Normal device function.  Next remote 91 days. Kathy Breach, RN, CCDS, [employer]  DG CHEST PORT 1 VIEW  Result Date: 05/05/2022 CLINICAL DATA:  Shortness of breath EXAM: PORTABLE CHEST 1 VIEW COMPARISON:  05/03/2022 FINDINGS: Left pacer and right Port-A-Cath remain in place, unchanged. Heart and mediastinal contours are within normal limits. No focal opacities or effusions. No acute bony abnormality. IMPRESSION: No active cardiopulmonary disease. Electronically Signed   By: Rolm Baptise M.D.   On: 05/05/2022 15:22   ECHOCARDIOGRAM COMPLETE  Result Date: 05/04/2022    ECHOCARDIOGRAM REPORT   Patient Name:   Clarence Andrade Date of Exam: 05/04/2022 Medical Rec #:  [id number]       Height:       72.0 in Accession #:    [id number]      Weight:       268.0 lb Date of Birth:  [birth year]       BSA:          2.413 m Patient Age:    54 years        BP:           131/96 mmHg Patient Gender: M               HR:           99 bpm. Exam Location:  Inpatient Procedure: 2D Echo and Intracardiac Opacification Agent Indications:    CHF  History:        Patient has prior history of Echocardiogram examinations, most                 recent 03/10/2020. Signs/Symptoms:Murmur.  Sonographer:     Harvie Junior Referring Phys: [id number] FANG XU  Sonographer Comments: Technically difficult study due to poor echo windows and patient is obese. Image acquisition challenging due to patient body habitus and Image acquisition challenging due to respiratory motion. IMPRESSIONS  1. Limited study due to poor echo windows.  2. Left ventricular ejection fraction, by estimation, is 55 to 60%. The left ventricle has normal function. The left ventricle has no regional wall motion abnormalities. There is mild concentric left ventricular hypertrophy. Left ventricular diastolic parameters are indeterminate.  3. Right ventricular systolic function is normal. The right ventricular size is not well visualized. There is normal pulmonary artery systolic pressure.  4. The mitral valve is grossly normal. Trivial mitral valve regurgitation. No evidence of mitral stenosis.  5. There is at least mild aortic stenosis and mean gradient may be underestimated due to suboptimal doppler angle  of interrogation for both LVOT VTI and AoV VTI. The aortic valve is calcified. Aortic valve regurgitation is not visualized. There is at least mild aortic stenosis. Aortic valve mean gradient measures 15.7 mmHg. Aortic valve Vmax measures 2.53 m/s.  6. Aortic dilatation noted. There is borderline dilatation of the aortic root, measuring 40 mm. Comparison(s): Compared to prior TTE in 2021, there continues to be at least mild AS (prior mean gradient 87mHg). Otherwise, there is no significant change. FINDINGS  Left Ventricle: Left ventricular ejection fraction, by estimation, is 55 to 60%. The left ventricle has normal function. The left ventricle has no regional wall motion abnormalities. The left ventricular internal cavity size was normal in size. There is  mild concentric left ventricular hypertrophy. Left ventricular diastolic parameters are indeterminate. Right Ventricle: The right ventricular size is not well visualized. Right vetricular wall thickness  was not well visualized. Right ventricular systolic function is normal. There is normal pulmonary artery systolic pressure. The tricuspid regurgitant velocity is 2.77 m/s, and with an assumed right atrial pressure of 3 mmHg, the estimated right ventricular systolic pressure is 381.1mmHg. Left Atrium: Left atrial size was not well visualized. Right Atrium: Right atrial size was not well visualized. Pericardium: There is no evidence of pericardial effusion. Mitral Valve: The mitral valve is grossly normal. There is mild thickening of the mitral valve leaflet(s). There is mild calcification of the mitral valve leaflet(s). Trivial mitral valve regurgitation. No evidence of mitral valve stenosis. Tricuspid Valve: The tricuspid valve is grossly normal. Tricuspid valve regurgitation is trivial. Aortic Valve: There is at least mild aortic stenosis and mean gradient may be underestimated due to suboptimal doppler angle of interrogation for both LVOT VTI and AoV VTI. The aortic valve is calcified. Aortic valve regurgitation is not visualized. There is at least mild aortic stenosis. Aortic valve mean gradient measures 15.7 mmHg. Aortic valve peak gradient measures 25.6 mmHg. Aortic valve area, by VTI measures 1.74 cm. Pulmonic Valve: The pulmonic valve was not well visualized. Aorta: Aortic dilatation noted. There is borderline dilatation of the aortic root, measuring 40 mm. IAS/Shunts: The atrial septum is grossly normal.  LEFT VENTRICLE PLAX 2D LVIDd:         4.20 cm      Diastology LVIDs:         2.60 cm      LV e' medial:    9.90 cm/s LV PW:         1.30 cm      LV E/e' medial:  8.7 LV IVS:        1.30 cm      LV e' lateral:   11.40 cm/s LVOT diam:     2.30 cm      LV E/e' lateral: 7.6 LV SV:         66 LV SV Index:   28 LVOT Area:     4.15 cm  LV Volumes (MOD) LV vol d, MOD A4C: 104.0 ml LV vol s, MOD A4C: 50.1 ml LV SV MOD A4C:     104.0 ml RIGHT VENTRICLE RV S prime:     15.10 cm/s TAPSE (M-mode): 2.1 cm LEFT ATRIUM              Index        RIGHT ATRIUM           Index LA Vol (A2C):   57.2 ml 23.71 ml/m  RA Area:     17.40 cm LA Vol (A4C):  62.0 ml 25.70 ml/m  RA Volume:   47.50 ml  19.69 ml/m LA Biplane Vol: 60.4 ml 25.03 ml/m  AORTIC VALVE                     PULMONIC VALVE AV Area (Vmax):    1.44 cm      PV Vmax:       1.37 m/s AV Area (Vmean):   1.79 cm      PV Peak grad:  7.5 mmHg AV Area (VTI):     1.74 cm AV Vmax:           252.80 cm/s AV Vmean:          163.667 cm/s AV VTI:            0.382 m AV Peak Grad:      25.6 mmHg AV Mean Grad:      15.7 mmHg LVOT Vmax:         87.50 cm/s LVOT Vmean:        70.500 cm/s LVOT VTI:          0.160 m LVOT/AV VTI ratio: 0.42  AORTA Ao Root diam: 3.60 cm MITRAL VALVE               TRICUSPID VALVE MV Area (PHT): 6.17 cm    TR Peak grad:   30.7 mmHg MV Decel Time: 123 msec    TR Vmax:        277.00 cm/s MR Peak grad: 76.2 mmHg MR Vmax:      436.33 cm/s  SHUNTS MV E velocity: 86.40 cm/s  Systemic VTI:  0.16 m MV A velocity: 50.30 cm/s  Systemic Diam: 2.30 cm MV E/A ratio:  1.72 Gwyndolyn Kaufman MD Electronically signed by Gwyndolyn Kaufman MD Signature Date/Time: 05/04/2022/4:45:28 PM    Final    VAS [place] LOWER EXTREMITY VENOUS (DVT)  Result Date: 05/03/2022  Lower Venous DVT Study Patient Name:  Clarence Andrade  Date of Exam:   05/03/2022 Medical Rec #: [id number]        Accession #:    [id number] Date of Birth: 1946-03-15        Patient Gender: M Patient Age:   48 years Exam Location:  [facility] Procedure:      VAS [place] LOWER EXTREMITY VENOUS (DVT) Referring Phys: Annamaria Boots XU --------------------------------------------------------------------------------  Indications: Edema.  Risk Factors: Cancer. Limitations: Poor ultrasound/tissue interface. Comparison Study: No prior studies. Performing Technologist: Oliver Hum RVT  Examination Guidelines: A complete evaluation includes B-mode imaging, spectral Doppler, color Doppler, and power Doppler as needed of all accessible  portions of each vessel. Bilateral testing is considered an integral part of a complete examination. Limited examinations for reoccurring indications may be performed as noted. The reflux portion of the exam is performed with the patient in reverse Trendelenburg.  +---------+---------------+---------+-----------+----------+--------------+ RIGHT    CompressibilityPhasicitySpontaneityPropertiesThrombus Aging +---------+---------------+---------+-----------+----------+--------------+ CFV      Full           Yes      Yes                                 +---------+---------------+---------+-----------+----------+--------------+ SFJ      Full                                                        +---------+---------------+---------+-----------+----------+--------------+  FV Prox  Full                                                        +---------+---------------+---------+-----------+----------+--------------+ FV Mid   Full                                                        +---------+---------------+---------+-----------+----------+--------------+ FV DistalFull                                                        +---------+---------------+---------+-----------+----------+--------------+ PFV      Full                                                        +---------+---------------+---------+-----------+----------+--------------+ POP      Full           Yes      Yes                                 +---------+---------------+---------+-----------+----------+--------------+ PTV      Full                                                        +---------+---------------+---------+-----------+----------+--------------+ PERO     Full                                                        +---------+---------------+---------+-----------+----------+--------------+   +---------+---------------+---------+-----------+----------+-------------------+ LEFT      CompressibilityPhasicitySpontaneityPropertiesThrombus Aging      +---------+---------------+---------+-----------+----------+-------------------+ CFV      Full           Yes      Yes                                      +---------+---------------+---------+-----------+----------+-------------------+ SFJ      Full                                                             +---------+---------------+---------+-----------+----------+-------------------+ FV Prox  Full                                                             +---------+---------------+---------+-----------+----------+-------------------+  FV Mid   Full                                                             +---------+---------------+---------+-----------+----------+-------------------+ FV DistalFull                                                             +---------+---------------+---------+-----------+----------+-------------------+ PFV      Full                                                             +---------+---------------+---------+-----------+----------+-------------------+ POP      Full           Yes      Yes                                      +---------+---------------+---------+-----------+----------+-------------------+ PTV      Full                                                             +---------+---------------+---------+-----------+----------+-------------------+ PERO                                                  Not well visualized +---------+---------------+---------+-----------+----------+-------------------+     Summary: RIGHT: - There is no evidence of deep vein thrombosis in the lower extremity. However, portions of this examination were limited- see technologist comments above.  - No cystic structure found in the popliteal fossa.  LEFT: - There is no evidence of deep vein thrombosis in the lower extremity. However, portions of this  examination were limited- see technologist comments above.  - No cystic structure found in the popliteal fossa.  *See table(s) above for measurements and observations. Electronically signed by Jamelle Haring on 05/03/2022 at 4:33:57 PM.    Final    CT Head Wo Contrast  Result Date: 05/03/2022 CLINICAL DATA:  Head trauma, moderate-severe; Neck trauma (Age >= 65y). Multiple recent falls. Currently undergoing treatment for oropharyngeal cancer. EXAM: CT HEAD WITHOUT CONTRAST CT CERVICAL SPINE WITHOUT CONTRAST TECHNIQUE: Multidetector CT imaging of the head and cervical spine was performed following the standard protocol without intravenous contrast. Multiplanar CT image reconstructions of the cervical spine were also generated. RADIATION DOSE REDUCTION: This exam was performed according to the departmental dose-optimization program which includes automated exposure control, adjustment of the mA and/or kV according to patient size and/or use of iterative reconstruction technique. COMPARISON:  Neck CT 02/02/2022 FINDINGS: CT HEAD FINDINGS Brain: There is no evidence of  an acute infarct, intracranial hemorrhage, mass, midline shift, or extra-axial fluid collection. The ventricles and sulci are within normal limits for age. Vascular: Calcified atherosclerosis at the skull base. No hyperdense vessel. Skull: No acute fracture or suspicious osseous lesion. Sinuses/Orbits: Mild right ethmoid air cell mucosal thickening. Clear mastoid air cells. Unremarkable orbits. Other: None. CT CERVICAL SPINE FINDINGS Alignment: Normal. Skull base and vertebrae: No acute fracture or suspicious osseous lesion. Soft tissues and spinal canal: No prevertebral fluid or swelling. No visible canal hematoma. Disc levels: Moderate cervical spondylosis and mild-to-moderate facet arthrosis. Multilevel neural foraminal stenosis due to uncovertebral spurring, moderate to severe on the right greater than left at C5-6, left greater than right at C6-7,  and on the right at C7-T1. Upper chest: Clear lung apices. Other: Post treatment changes in the neck with mucosal edema. Decreased size of the left level II nodal mass and decreased size or possible resolution of the left-sided oropharyngeal mass, not primarily evaluated on this study. IMPRESSION: No evidence of acute intracranial abnormality or cervical spine fracture. Electronically Signed   By: Logan Bores M.D.   On: 05/03/2022 10:38   CT Cervical Spine Wo Contrast  Result Date: 05/03/2022 CLINICAL DATA:  Head trauma, moderate-severe; Neck trauma (Age >= 65y). Multiple recent falls. Currently undergoing treatment for oropharyngeal cancer. EXAM: CT HEAD WITHOUT CONTRAST CT CERVICAL SPINE WITHOUT CONTRAST TECHNIQUE: Multidetector CT imaging of the head and cervical spine was performed following the standard protocol without intravenous contrast. Multiplanar CT image reconstructions of the cervical spine were also generated. RADIATION DOSE REDUCTION: This exam was performed according to the departmental dose-optimization program which includes automated exposure control, adjustment of the mA and/or kV according to patient size and/or use of iterative reconstruction technique. COMPARISON:  Neck CT 02/02/2022 FINDINGS: CT HEAD FINDINGS Brain: There is no evidence of an acute infarct, intracranial hemorrhage, mass, midline shift, or extra-axial fluid collection. The ventricles and sulci are within normal limits for age. Vascular: Calcified atherosclerosis at the skull base. No hyperdense vessel. Skull: No acute fracture or suspicious osseous lesion. Sinuses/Orbits: Mild right ethmoid air cell mucosal thickening. Clear mastoid air cells. Unremarkable orbits. Other: None. CT CERVICAL SPINE FINDINGS Alignment: Normal. Skull base and vertebrae: No acute fracture or suspicious osseous lesion. Soft tissues and spinal canal: No prevertebral fluid or swelling. No visible canal hematoma. Disc levels: Moderate cervical  spondylosis and mild-to-moderate facet arthrosis. Multilevel neural foraminal stenosis due to uncovertebral spurring, moderate to severe on the right greater than left at C5-6, left greater than right at C6-7, and on the right at C7-T1. Upper chest: Clear lung apices. Other: Post treatment changes in the neck with mucosal edema. Decreased size of the left level II nodal mass and decreased size or possible resolution of the left-sided oropharyngeal mass, not primarily evaluated on this study. IMPRESSION: No evidence of acute intracranial abnormality or cervical spine fracture. Electronically Signed   By: Logan Bores M.D.   On: 05/03/2022 10:38   DG Chest 2 View  Result Date: 05/03/2022 CLINICAL DATA:  Cough, weakness EXAM: CHEST - 2 VIEW COMPARISON:  11/08/2018 FINDINGS: Right IJ approach chest port with distal tip terminating the level of the distal SVC. Left-sided implanted cardiac device remains in place. Cardiomegaly. No focal airspace consolidation, pleural effusion, or pneumothorax. IMPRESSION: No active cardiopulmonary disease. Electronically Signed   By: Davina Poke D.O.   On: 05/03/2022 10:25    All questions were answered. The patient knows to call the clinic with any problems, questions  or concerns. I spent 30 minutes in the care of this patient including H and P, review of records, counseling and coordination of care.     Benay Pike, MD 05/25/2022 4:29 PM

## 2022-05-26 LAB — TSH: TSH: 0.821 u[IU]/mL (ref 0.350–4.500)

## 2022-05-27 ENCOUNTER — Telehealth: Payer: Self-pay | Admitting: Hematology and Oncology

## 2022-05-27 MED ORDER — MIDODRINE HCL 5 MG PO TABS: 5.0000 mg | ORAL_TABLET | Freq: Three times a day (TID) | ORAL | 2 refills | Status: DC

## 2022-05-27 NOTE — Telephone Encounter (Signed)
Would recommend trying to go up on dose to '5mg'$  TID (from previous dose of 2.'5mg'$  BID) - please update rx. Can use up the 2.'5mg'$  tablets if he wants to equal the '5mg'$  TID dose but needs new script. Send BP readings 1 week. Thank you!

## 2022-05-27 NOTE — Telephone Encounter (Signed)
Scheduled appointments per 1/17 los. Patient is aware of the made appointments.

## 2022-06-08 ENCOUNTER — Encounter: Payer: Self-pay | Admitting: Hematology and Oncology

## 2022-06-14 DIAGNOSIS — R3915 Urgency of urination: Secondary | ICD-10-CM | POA: Diagnosis not present

## 2022-06-14 DIAGNOSIS — R972 Elevated prostate specific antigen [PSA]: Secondary | ICD-10-CM | POA: Diagnosis not present

## 2022-06-16 ENCOUNTER — Telehealth: Payer: Self-pay | Admitting: *Deleted

## 2022-06-16 NOTE — Telephone Encounter (Signed)
CALLED PATIENT TO INFORM OF PET SCAN FOR 07-25-22- ARRIVAL TIME- 9:30 AM @ [facility], PATIENT TO HAVE WATER ONLY- 6 HRS. PRIOR TO TEST, PATIENT TO SEE DR. SQUIRE ON 07-26-22 @ 3:20 PM FOR RESULTS, SPOKE WITH PATIENT AND HE IS AWARE OF THESE APPTS. AND THE INSTRUCTIONS

## 2022-06-22 ENCOUNTER — Other Ambulatory Visit: Payer: Self-pay

## 2022-06-22 ENCOUNTER — Inpatient Hospital Stay (HOSPITAL_BASED_OUTPATIENT_CLINIC_OR_DEPARTMENT_OTHER): Payer: PPO | Admitting: Hematology and Oncology

## 2022-06-22 ENCOUNTER — Inpatient Hospital Stay: Payer: PPO | Admitting: Dietician

## 2022-06-22 ENCOUNTER — Encounter: Payer: Self-pay | Admitting: Hematology and Oncology

## 2022-06-22 ENCOUNTER — Inpatient Hospital Stay: Payer: PPO | Attending: Hematology and Oncology

## 2022-06-22 VITALS — BP 96/72 | HR 78 | Temp 97.9°F | Resp 16 | Ht 72.0 in | Wt 246.6 lb

## 2022-06-22 DIAGNOSIS — C109 Malignant neoplasm of oropharynx, unspecified: Secondary | ICD-10-CM | POA: Diagnosis not present

## 2022-06-22 DIAGNOSIS — Z95828 Presence of other vascular implants and grafts: Secondary | ICD-10-CM

## 2022-06-22 DIAGNOSIS — C01 Malignant neoplasm of base of tongue: Secondary | ICD-10-CM

## 2022-06-22 DIAGNOSIS — Z79620 Long term (current) use of immunosuppressive biologic: Secondary | ICD-10-CM | POA: Diagnosis not present

## 2022-06-22 DIAGNOSIS — Z79899 Other long term (current) drug therapy: Secondary | ICD-10-CM | POA: Insufficient documentation

## 2022-06-22 DIAGNOSIS — R7989 Other specified abnormal findings of blood chemistry: Secondary | ICD-10-CM

## 2022-06-22 LAB — CMP (CANCER CENTER ONLY)
ALT: 17 U/L (ref 0–44)
AST: 23 U/L (ref 15–41)
Albumin: 3.5 g/dL (ref 3.5–5.0)
Alkaline Phosphatase: 58 U/L (ref 38–126)
Anion gap: 8 (ref 5–15)
BUN: 24 mg/dL — ABNORMAL HIGH (ref 8–23)
CO2: 30 mmol/L (ref 22–32)
Calcium: 9 mg/dL (ref 8.9–10.3)
Chloride: 101 mmol/L (ref 98–111)
Creatinine: 0.83 mg/dL (ref 0.61–1.24)
GFR, Estimated: >60 mL/min (ref >60)
Glucose, Bld: 136 mg/dL — ABNORMAL HIGH (ref 70–99)
Potassium: 4 mmol/L (ref 3.5–5.1)
Sodium: 139 mmol/L (ref 135–145)
Total Bilirubin: 0.8 mg/dL (ref 0.3–1.2)
Total Protein: 6.9 g/dL (ref 6.5–8.1)

## 2022-06-22 LAB — TSH: TSH: 1.144 u[IU]/mL (ref 0.350–4.500)

## 2022-06-22 LAB — CBC WITH DIFFERENTIAL/PLATELET
Abs Immature Granulocytes: 0.01 10*3/uL (ref 0.00–0.07)
Basophils Absolute: 0 10*3/uL (ref 0.0–0.1)
Basophils Relative: 0 %
Eosinophils Absolute: 0.3 10*3/uL (ref 0.0–0.5)
Eosinophils Relative: 5 %
HCT: 35.1 % — ABNORMAL LOW (ref 39.0–52.0)
Hemoglobin: 12 g/dL — ABNORMAL LOW (ref 13.0–17.0)
Immature Granulocytes: 0 %
Lymphocytes Relative: 13 %
Lymphs Abs: 0.8 10*3/uL (ref 0.7–4.0)
MCH: 34.2 pg — ABNORMAL HIGH (ref 26.0–34.0)
MCHC: 34.2 g/dL (ref 30.0–36.0)
MCV: 100 fL (ref 80.0–100.0)
Monocytes Absolute: 0.6 10*3/uL (ref 0.1–1.0)
Monocytes Relative: 10 %
Neutro Abs: 4.3 10*3/uL (ref 1.7–7.7)
Neutrophils Relative %: 72 %
Platelets: 148 10*3/uL — ABNORMAL LOW (ref 150–400)
RBC: 3.51 MIL/uL — ABNORMAL LOW (ref 4.22–5.81)
RDW: 12.9 % (ref 11.5–15.5)
WBC: 6.1 10*3/uL (ref 4.0–10.5)
nRBC: 0 % (ref 0.0–0.2)

## 2022-06-22 MED ORDER — SODIUM CHLORIDE 0.9% FLUSH
10.0000 mL | Freq: Once | INTRAVENOUS | Status: AC
  Administered 2022-06-22: 10 mL

## 2022-06-22 MED ORDER — HEPARIN SOD (PORK) LOCK FLUSH 100 UNIT/ML IV SOLN
500.0000 [IU] | Freq: Once | INTRAVENOUS | Status: AC
  Administered 2022-06-22: 500 [IU]

## 2022-06-22 NOTE — Assessment & Plan Note (Signed)
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 PET/CT without any evidence of distant metastatic disease.   He is currently on concurrent chemo and radiation, status post 6 weekly cycles of cisplatin. He had complete clinical response with resolution of bilateral cervical lymphadenopathy.  He is currently recovering.  He continues to lose weight since he has not been using the G-tube and trying to eat by mouth.  He would like to have the G-tube removed.  We have discussed about trying to maintain weight and we will reassess mid March to see if it can be removed.  Port can be removed PET/CT has no evidence of disease. We have discussed about surveillance which includes every 2 to 3 months visits with ENT, annual visit with [place] and FU as recommended by Dr Isidore Moos. His labs today showed remarkable improvement, mild thrombocytopenia of no significant concern.  He was instructed to call us with any new questions or concerns.  He can return to clinic in 1 year for evaluation and we will follow his TSH at that point.

## 2022-06-22 NOTE — Progress Notes (Signed)
[facility] [place] [place] NOTE  Patient Care Team: Kathalene Frames, MD as PCP - General (Internal Medicine) Sueanne Margarita, MD as PCP - Cardiology (Cardiology) Constance Haw, MD as PCP - Electrophysiology (Cardiology) Malmfelt, Stephani Police, RN as Oncology Nurse Navigator Benay Pike, MD as Consulting Physician (Hematology and Oncology) [name], MD as Consulting Physician (Radiation Oncology)  CHIEF COMPLAINTS/PURPOSE OF CONSULTATION:  Squamous cell carcinoma of the oropharynx  ASSESSMENT & PLAN:   Malignant neoplasm of base of tongue [facility]) This is a very pleasant 77 year old male patient with 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 PET/CT without any evidence of distant metastatic disease.   He is currently on concurrent chemo and radiation, status post 6 weekly cycles of cisplatin. He had complete clinical response with resolution of bilateral cervical lymphadenopathy.  He is currently recovering.  He continues to lose weight since he has not been using the G-tube and trying to eat by mouth.  He would like to have the G-tube removed.  We have discussed about trying to maintain weight and we will reassess mid March to see if it can be removed.  Port can be removed PET/CT has no evidence of disease. We have discussed about surveillance which includes every 2 to 3 months visits with ENT, annual visit with [place] and FU as recommended by Dr Isidore Moos. His labs today showed remarkable improvement, mild thrombocytopenia of no significant concern.  He was instructed to call us with any new questions or concerns.  He can return to clinic in 1 year for evaluation and we will follow his TSH at that point.  HISTORY OF PRESENTING ILLNESS:   Clarence Andrade 77 y.o. male is here because of SCC oropharynx.  Oncologic History  This is a very pleasant  77 year old male patient who was seen by ENT with chief complaint of lump in his neck ongoing for the past 1 to 2 months.  Around July he suddenly noticed the lump in the neck.  He was initially treated by antibiotics but this has not changed.  He also has started noticing some difficulty swallowing and some dysgeusia.  He tells me that he has lost about 20 pounds of weight in the past 2 to 3 months but part of it could be intentional.  He was seen by Dr. Sabino Gasser on September 20 for an initial consultation and he had flexible fiberoptic endoscopy the same day which noticed large base of tongue tumor.  This tumor measured at least 2 to 3 cm in diameter extending across midline.  He also had a CT on September 27 which confirmed 4 cm mainly exophytic mass centered at the left base of the tongue with at least 1 ipsilateral malignant node measuring nearly 4 cm with signs of extracapsular extension.  Findings consistent with metastatic squamous cell carcinoma.  PET/CT done on October 13 with no findings of metastatic disease, large left-sided hypermetabolic oropharyngeal neoplasm and adjacent left-sided metastatic lymphadenopathy.  Interval history  Patient is here for follow-up. Since his last visit here, he stopped using the G tube in early January. He has lost about 10 lbs since early January. He is able to eat soft foods, liquids and drinking 4 cartons of ensure day.  He is feeling more energetic over all. He has a follow up PET scheduled for March 18 th and appt with Dr Isidore Moos soon after He was curious  about the surveillance. Rest of the pertinent 10 point ROS reviewed and neg.  Wt Readings from Last 3 Encounters:  06/22/22 246 lb 9.6 oz (111.9 kg)  05/25/22 251 lb 11.2 oz (114.2 kg)  05/17/22 255 lb 11.2 oz (116 kg)     MEDICAL HISTORY:  Past Medical History:  Diagnosis Date   Abdominal hernia    Midline   Adrenal incidentaloma ([place]) 2010   Aortic stenosis    Ascending aortic aneurysm  (HCC)    CAD (coronary artery disease)    a. Cath 03/2018 - CTO of the prox-mid LAD with bridging, L-L and R-L collaterals, moderate, non-obstructive disease involving the proximal LAD, proximal LCx, and distal RCA, more severe disease noted involving small branch vessels (RV marginal and superior branch of OM3).   Complete heart block (HCC)    Gallstones    Heart murmur    History of pacemaker    a. 11/2018 - [employer], intermittent CHB.   Hyperlipidemia    Morbid obesity (HCC)    Myocardial infarction [facility])    NSVT (nonsustained ventricular tachycardia) (HCC)    OSA (obstructive sleep apnea)    Severe PSG 1/07 AHI 66/hr, O2 Nadir 50% Refuses treatment - Pt does not believe test was accurate   Pericarditis    Pneumonia    Pulmonary HTN (HCC)    mild with PASP 4mHg by echo 02/2018, not seen on 03/2020 echo   Pulmonary nodule    Screening for AAA (abdominal aortic aneurysm) 2010   CT Abd/Pelvis   Squamous cell carcinoma of oropharynx (HCC)    Ventral hernia    03/2009    SURGICAL HISTORY: Past Surgical History:  Procedure Laterality Date   CHOLECYSTECTOMY N/A 10/11/2019   Procedure: LAPAROSCOPIC CHOLECYSTECTOMY WITH INTRAOPERATIVE CHOLANGIOGRAM;  Surgeon: GArmandina Gemma MD;  Location: [facility];  Service: General;  Laterality: N/A;   CORONARY ANGIOGRAPHY N/A 03/23/2018   Procedure: CORONARY ANGIOGRAPHY;  Surgeon: ENelva Bush MD;  [facility];  Service: Cardiovascular;  Laterality: N/A;   HERNIA REPAIR  05/2002   three   IR GASTROSTOMY TUBE MOD SED  03/08/2022   IR GJ TUBE CHANGE  04/15/2022   IR IMAGING GUIDED PORT INSERTION  03/08/2022   KNEE ARTHROSCOPY Left 09/2002   PACEMAKER IMPLANT N/A 11/08/2018   Procedure: PACEMAKER IMPLANT;  Surgeon: CConstance Haw MD;  Location: [facility];  Service: Cardiovascular;  Laterality: N/A;   SHOULDER SURGERY Right 2020   TOTAL KNEE ARTHROPLASTY Bilateral 09/2010   VENTRAL HERNIA REPAIR  03/2009     SOCIAL HISTORY: Social History   Socioeconomic History   Marital status: Married    Spouse name: Not on file   Number of children: Not on file   Years of education: Not on file   Highest education level: Not on file  Occupational History   Not on file  Tobacco Use   Smoking status: Former    Packs/day: 0.50    Types: Cigarettes    Quit date: 1102   Years since quitting: 31.1   Smokeless tobacco: Never  Vaping Use   Vaping Use: Never used  Substance and Sexual Activity   Alcohol use: No   Drug use: No   Sexual activity: Not Currently  Other Topics Concern   Not on file  Social History Narrative   Not on file   Social Determinants of Health   Financial Resource Strain: Low Risk  (02/24/2022)   Overall Financial Resource Strain (CARDIA)  Difficulty of Paying Living Expenses: Not hard at all  Food Insecurity: No Food Insecurity (05/04/2022)   Hunger Vital Sign    Worried About Running Out of Food in the Last Year: Never true    Ran Out of Food in the Last Year: Never true  Transportation Needs: No Transportation Needs (05/04/2022)   PRAPARE - Hydrologist (Medical): No    Lack of Transportation (Non-Medical): No  Physical Activity: Not on file  Stress: Not on file  Social Connections: Not on file  Intimate Partner Violence: Not At Risk (05/04/2022)   Humiliation, Afraid, Rape, and Kick questionnaire    Fear of Current or Ex-Partner: No    Emotionally Abused: No    Physically Abused: No    Sexually Abused: No    FAMILY HISTORY: Family History  Problem Relation Age of Onset   Heart attack Father    Heart disease Father    Aneurysm Father    Breast cancer Sister    Colon cancer Sister     ALLERGIES:  is allergic to sertraline hcl and transderm-scop [scopolamine].  MEDICATIONS:  Current Outpatient Medications  Medication Sig Dispense Refill   acetaminophen (TYLENOL) 500 MG tablet Place 1,000 mg into feeding tube every 6  (six) hours as needed (for pain).     aspirin EC 81 MG tablet 81 mg See admin instructions. 81 mg, per tube, once a day     buPROPion (WELLBUTRIN XL) 150 MG 24 hr tablet 150 mg See admin instructions. 150 mg, per tube, every morning     feeding supplement, ENSURE COMPLETE, (ENSURE COMPLETE) LIQD Place 237 mLs into feeding tube See admin instructions. Mix 237 ml's of Ensure Complete with 237 ml's of milk = 3 feedings a day     midodrine (PROAMATINE) 5 MG tablet Take 1 tablet (5 mg total) by mouth 3 (three) times daily with meals. 90 tablet 2   nitroGLYCERIN (NITROSTAT) 0.4 MG SL tablet Place 1 tablet (0.4 mg total) under the tongue every 5 (five) minutes as needed for chest pain. (Patient not taking: Reported on 05/16/2022) 25 tablet 3   Nutritional Supplements (ISOSOURCE) LIQD Place into feeding tube See admin instructions. Three feedings a day     Protein (FEEDING SUPPLEMENT, PROSOURCE TF20,) liquid Place 60 mLs into feeding tube daily.     rosuvastatin (CRESTOR) 10 MG tablet TAKE 1 TABLET ONCE DAILY. 90 tablet 0   tamsulosin (FLOMAX) 0.4 MG CAPS capsule Take 0.4 mg by mouth daily.     temazepam (RESTORIL) 15 MG capsule Take 1 capsule (15 mg total) by mouth at bedtime as needed for sleep. 30 capsule 0   Water For Irrigation, Sterile (FREE WATER) SOLN Place 200 mLs into feeding tube every 8 (eight) hours.     No current facility-administered medications for this visit.     PHYSICAL EXAMINATION: ECOG PERFORMANCE STATUS: 0 - Asymptomatic  Vitals:   06/22/22 1501  BP: 96/72  Pulse: 78  Resp: 16  Temp: 97.9 F (36.6 C)  SpO2: 97%    Filed Weights   06/22/22 1501  Weight: 246 lb 9.6 oz (111.9 kg)     Physical Exam Constitutional:      General: He is not in acute distress.    Appearance: Normal appearance.  HENT:     Mouth/Throat:     Mouth: Mucous membranes are moist.  Musculoskeletal:        General: Swelling: BLE swelling 1+, improving.  Cervical back: Normal range of  motion and neck supple. No rigidity.  Lymphadenopathy:     Cervical: No cervical adenopathy.  Skin:    General: Skin is warm and dry.  Neurological:     Mental Status: He is alert.    LABORATORY DATA:  I have reviewed the data as listed Lab Results  Component Value Date   WBC 6.1 06/22/2022   HGB 12.0 (L) 06/22/2022   HCT 35.1 (L) 06/22/2022   MCV 100.0 06/22/2022   PLT 148 (L) 06/22/2022     Chemistry      Component Value Date/Time   NA 139 06/22/2022 1416   NA 140 10/13/2021 0954   K 4.0 06/22/2022 1416   CL 101 06/22/2022 1416   CO2 30 06/22/2022 1416   BUN 24 (H) 06/22/2022 1416   BUN 20 10/13/2021 0954   CREATININE 0.83 06/22/2022 1416      Component Value Date/Time   CALCIUM 9.0 06/22/2022 1416   ALKPHOS 58 06/22/2022 1416   AST 23 06/22/2022 1416   ALT 17 06/22/2022 1416   BILITOT 0.8 06/22/2022 1416       RADIOGRAPHIC STUDIES: I have personally reviewed the radiological images as listed and agreed with the findings in the report. No results found.  All questions were answered. The patient knows to call the clinic with any problems, questions or concerns. I spent 30 minutes in the care of this patient including H and P, review of records, counseling and coordination of care.     Benay Pike, MD 06/22/2022 3:47 PM

## 2022-06-22 NOTE — Progress Notes (Signed)
Nutrition Follow-up:  Patient has completed concurrent chemoradiation for SCC of oropharynx. Last radiation on 04/25/22. Patient has PEG  Met with patient and wife in office. Patient reports diminished/bad taste of foods. This is slowly improving. He does well with eggs, rice, salads, fruits, creamy soups, orange juice. Patient states he has starting eating more in the last few weeks. Patient continues drinking 4-5 protein shakes daily. He says weights have been stable ~247 lb the last 2-3 weeks. Patient reports improved dry mouth and thick saliva. Denies nausea, vomiting, diarrhea, constipation. He continues to feel fatigued. Patient is active and goes to the gym daily.    Medications: reviewed   Labs: glucose 136, BUN 24  Anthropometrics: Wt 246 lb 9.6 oz today decreased 6.5% (17 lbs) in the last 5 weeks - concerning.   1/5 - 263.6 lb 12/12 - 259 lb 4.8 oz 11/21 - 269 lb 9.6 oz    NUTRITION DIAGNOSIS: Inadequate oral intake improving    INTERVENTION:  Encouraged continuing trying a variety of foods Continue drinking 4-5 Ensure Plus daily  Encouraged high protein high calorie foods to promote stable weight    MONITORING, EVALUATION, GOAL: weight trends, intake    NEXT VISIT: To be scheduled as needed. Patient has contact information

## 2022-06-23 ENCOUNTER — Other Ambulatory Visit: Payer: Self-pay

## 2022-06-23 DIAGNOSIS — C01 Malignant neoplasm of base of tongue: Secondary | ICD-10-CM

## 2022-06-24 DIAGNOSIS — I442 Atrioventricular block, complete: Secondary | ICD-10-CM | POA: Insufficient documentation

## 2022-06-24 DIAGNOSIS — Z950 Presence of cardiac pacemaker: Secondary | ICD-10-CM | POA: Insufficient documentation

## 2022-06-24 NOTE — Progress Notes (Unsigned)
Cardiology Office Note Date:  06/27/2022  Patient ID:  Clarence Andrade, Clarence Andrade Nov 11, 1945, MRN [id number] PCP:  Kathalene Frames, MD  Cardiologist:  Fransico Him, MD Electrophysiologist: Constance Haw, MD  Chief Complaint: 5 mon follow-up PPM  History of Present Illness: Clarence Andrade is a 77 y.o. male with PMH notable for CAD, HLD, OSA, intermittent CHB s/p PPM, TAA, aortic stenosis, squamous cell carcinoma of tongue; seen today for Will Meredith Leeds, MD for routine electrophysiology followup.   Since last being seen in our clinic the patient reports doing "fair". He has completed radiation and chemo treatments for oral squamous cell carcinoma and is slowly recovering, physically. He exercises on a on recumbant bike, and has slowly been increasing his endurance.  He continues to experience lightheadedness when he stands quickly, but this has improved from prior. Systolic BP readings at home are usually in 100s or higher.  .  he denies chest pain, palpitations, dyspnea, PND, orthopnea, nausea, vomiting, dizziness, syncope, edema, weight gain, or early satiety.     Device Information: MDT dual chamber PPM, imp 11/2018; dx intermittent CHB  Past Medical History:  Diagnosis Date   Abdominal hernia    Midline   Adrenal incidentaloma ([place]) 2010   Aortic stenosis    Ascending aortic aneurysm (HCC)    CAD (coronary artery disease)    a. Cath 03/2018 - CTO of the prox-mid LAD with bridging, L-L and R-L collaterals, moderate, non-obstructive disease involving the proximal LAD, proximal LCx, and distal RCA, more severe disease noted involving small branch vessels (RV marginal and superior branch of OM3).   Complete heart block (HCC)    Gallstones    Heart murmur    History of pacemaker    a. 11/2018 - Medtronic, intermittent CHB.   Hyperlipidemia    Morbid obesity (HCC)    Myocardial infarction [facility])    NSVT (nonsustained ventricular tachycardia) (HCC)    OSA (obstructive  sleep apnea)    Severe PSG 1/07 AHI 66/hr, O2 Nadir 50% Refuses treatment - Pt does not believe test was accurate   Pericarditis    Pneumonia    Pulmonary HTN (HCC)    mild with PASP 55mHg by echo 02/2018, not seen on 03/2020 echo   Pulmonary nodule    Screening for AAA (abdominal aortic aneurysm) 2010   CT Abd/Pelvis   [place]    Ventral hernia    03/2009    Past Surgical History:  Procedure Laterality Date   CHOLECYSTECTOMY N/A 10/11/2019   Procedure: LAPAROSCOPIC CHOLECYSTECTOMY WITH INTRAOPERATIVE CHOLANGIOGRAM;  Surgeon: GArmandina Gemma MD;  Location: [facility];  Service: General;  Laterality: N/A;   CORONARY ANGIOGRAPHY N/A 03/23/2018   Procedure: CORONARY ANGIOGRAPHY;  Surgeon: ENelva Bush MD;  [place];  Service: Cardiovascular;  Laterality: N/A;   HERNIA REPAIR  05/2002   three   IR GASTROSTOMY TUBE MOD SED  03/08/2022   IR GJ TUBE CHANGE  04/15/2022   IR IMAGING GUIDED PORT INSERTION  03/08/2022   KNEE ARTHROSCOPY Left 09/2002   PACEMAKER IMPLANT N/A 11/08/2018   Procedure: PACEMAKER IMPLANT;  Surgeon: CConstance Haw MD;  [facility];  Service: Cardiovascular;  Laterality: N/A;   SHOULDER SURGERY Right 2020   TOTAL KNEE ARTHROPLASTY Bilateral 09/2010   VENTRAL HERNIA REPAIR  03/2009    Current Outpatient Medications  Medication Instructions   acetaminophen (TYLENOL) 1,000 mg, Per Tube, Every 6 hours PRN  aspirin EC 81 mg, See admin instructions, 81 mg, per tube, once a day   buPROPion (WELLBUTRIN XL) 150 mg, See admin instructions, 150 mg, per tube, every morning   feeding supplement, ENSURE COMPLETE, (ENSURE COMPLETE) LIQD 237 mLs, Per Tube, See admin instructions, Mix 237 ml's of Ensure Complete with 237 ml's of milk = 3 feedings a day   metoprolol succinate (TOPROL XL) 25 mg, Oral, Daily at bedtime   midodrine (PROAMATINE) 5 mg, Oral, 3 times daily with meals   nitroGLYCERIN  (NITROSTAT) 0.4 mg, Sublingual, Every 5 min PRN   rosuvastatin (CRESTOR) 10 MG tablet TAKE 1 TABLET ONCE DAILY.   tamsulosin (FLOMAX) 0.4 mg, Oral, Daily   temazepam (RESTORIL) 15 mg, Oral, At bedtime PRN   Water For Irrigation, Sterile (FREE WATER) SOLN 200 mLs, Per Tube, Every 8 hours    Social History:  The patient  reports that he quit smoking about 31 years ago. His smoking use included cigarettes. He smoked an average of .5 packs per day. He has never used smokeless tobacco. He reports that he does not drink alcohol and does not use drugs.   Family History:  The patient's family history includes Aneurysm in his father; Breast cancer in his sister; Colon cancer in his sister; Heart attack in his father; Heart disease in his father.  ROS:  Please see the history of present illness. All other systems are reviewed and otherwise negative.   PHYSICAL EXAM:  VS:  BP 98/66   Pulse 95   Ht 6' (1.829 m)   Wt 248 lb (112.5 kg)   SpO2 96%   BMI 33.63 kg/m  BMI: Body mass index is 33.63 kg/m.  GEN- The patient is well appearing, alert and oriented x 3 today.   HEENT: normocephalic, atraumatic; sclera clear, conjunctiva pink; hearing intact; oropharynx clear; neck supple, no JVP Lungs- Clear to ausculation bilaterally, normal work of breathing.  No wheezes, rales, rhonchi Heart- Irregularly irregular rate and rhythm, 2/6 holosytic murmurs, rubs or gallops, PMI not laterally displaced GI- soft, non-tender, non-distended, bowel sounds present, no hepatosplenomegaly Extremities- 1+ peripheral edema. no clubbing or cyanosis; DP/PT/radial pulses 2+ bilaterally MS- no significant deformity or atrophy Skin- warm and dry, no rash or lesion, device pocket well-healed Psych- euthymic mood, full affect Neuro- strength and sensation are intact   Device interrogation done today and reviewed by myself:  Battery good Frequent PVCs on presentation Lead thresholds, impedence, sensing stable  Brief  episodes of NSVT, less than 1 minute No changes made today  EKG is not ordered.   Recent Labs: 05/03/2022: B Natriuretic Peptide 181.7 05/17/2022: Magnesium 1.6 06/22/2022: ALT 17; BUN 24; Creatinine 0.83; Hemoglobin 12.0; Platelets 148; Potassium 4.0; Sodium 139; TSH 1.144  No results found for requested labs within last 365 days.   Estimated Creatinine Clearance: 98.1 mL/min (by C-G formula based on SCr of 0.83 mg/dL).   Wt Readings from Last 3 Encounters:  06/27/22 248 lb (112.5 kg)  06/22/22 246 lb 9.6 oz (111.9 kg)  05/25/22 251 lb 11.2 oz (114.2 kg)     Additional studies reviewed include: Previous EP, cardiology notes.   TTE, 05/04/2022 1. Limited study due to poor echo windows.   2. Left ventricular ejection fraction, by estimation, is 55 to 60%. The left ventricle has normal function. The left ventricle has no regional wall motion abnormalities. There is mild concentric left ventricular hypertrophy. Left ventricular diastolic  parameters are indeterminate.   3. Right ventricular systolic function is  normal. The right ventricular size is not well visualized. There is normal pulmonary artery systolic pressure.   4. The mitral valve is grossly normal. Trivial mitral valve regurgitation. No evidence of mitral stenosis.   5. There is at least mild aortic stenosis and mean gradient may be underestimated due to suboptimal doppler angle of interrogation for both LVOT VTI and AoV VTI. The aortic valve is calcified. Aortic valve regurgitation is not visualized. There is at least mild aortic stenosis. Aortic valve mean gradient measures 15.7 mmHg. Aortic valve Vmax measures 2.53 m/s.   6. Aortic dilatation noted. There is borderline dilatation of the aortic root, measuring 40 mm.   Comparison(s): Compared to prior TTE in 2021, there continues to be at least mild AS (prior mean gradient 66mHg). Otherwise, there is no significant change.     ASSESSMENT AND PLAN:  #) intermittent CHB  s/p PPM Device functioning well, see paceart for details No changes made  #) PVCs #) NSVT Very frequent PVCs on presentation  Has previously had fatigue/headaches on metop, but agreeable to trialing again Start Toprol 28mnightly  #) CAD No concerning s/s Follows with gen cards, has appt upcoming  #) OSA Encouraged CPAP compliance   Current medicines are reviewed at length with the patient today.   The patient does not have concerns regarding his medicines.  The following changes were made today:   START toprol XL 2578mightly  Labs/ tests ordered today include:  No orders of the defined types were placed in this encounter.    Disposition: Follow up with Dr. CamCurt Bears in 6 months   Signed, SuzMamie LeversP  06/27/22  10:46 AM  Electrophysiology [facility]

## 2022-06-27 ENCOUNTER — Encounter: Payer: Self-pay | Admitting: Student

## 2022-06-27 ENCOUNTER — Ambulatory Visit: Payer: PPO | Attending: Student | Admitting: Cardiology

## 2022-06-27 VITALS — BP 98/66 | HR 95 | Ht 72.0 in | Wt 248.0 lb

## 2022-06-27 DIAGNOSIS — I493 Ventricular premature depolarization: Secondary | ICD-10-CM

## 2022-06-27 DIAGNOSIS — I442 Atrioventricular block, complete: Secondary | ICD-10-CM | POA: Diagnosis not present

## 2022-06-27 DIAGNOSIS — Z950 Presence of cardiac pacemaker: Secondary | ICD-10-CM | POA: Diagnosis not present

## 2022-06-27 DIAGNOSIS — I2510 Atherosclerotic heart disease of native coronary artery without angina pectoris: Secondary | ICD-10-CM | POA: Diagnosis not present

## 2022-06-27 LAB — CUP PACEART INCLINIC DEVICE CHECK
Battery Remaining Longevity: 132 mo
Battery Voltage: 3.02 V
Brady Statistic AP VP Percent: 1.54 %
Brady Statistic AP VS Percent: 6.91 %
Brady Statistic AS VP Percent: 17.24 %
Brady Statistic AS VS Percent: 74.31 %
Brady Statistic RA Percent Paced: 8.45 %
Brady Statistic RV Percent Paced: 18.78 %
Date Time Interrogation Session: 20240219121641
Implantable Lead Connection Status: 753985
Implantable Lead Connection Status: 753985
Implantable Lead Implant Date: 20200702
Implantable Lead Implant Date: 20200702
Implantable Lead Location: 753859
Implantable Lead Location: 753860
Implantable Lead Model: 5076
Implantable Lead Model: 5076
Implantable Pulse Generator Implant Date: 20200702
Lead Channel Impedance Value: 342 Ohm
Lead Channel Impedance Value: 399 Ohm
Lead Channel Impedance Value: 437 Ohm
Lead Channel Impedance Value: 532 Ohm
Lead Channel Pacing Threshold Amplitude: 0.5 V
Lead Channel Pacing Threshold Amplitude: 0.75 V
Lead Channel Pacing Threshold Pulse Width: 0.4 ms
Lead Channel Pacing Threshold Pulse Width: 0.4 ms
Lead Channel Sensing Intrinsic Amplitude: 10.5 mV
Lead Channel Sensing Intrinsic Amplitude: 2.75 mV
Lead Channel Sensing Intrinsic Amplitude: 4 mV
Lead Channel Sensing Intrinsic Amplitude: 8.375 mV
Lead Channel Setting Pacing Amplitude: 1.5 V
Lead Channel Setting Pacing Amplitude: 2 V
Lead Channel Setting Pacing Pulse Width: 0.4 ms
Lead Channel Setting Sensing Sensitivity: 2.8 mV
Zone Setting Status: 755011
Zone Setting Status: 755011

## 2022-06-27 MED ORDER — METOPROLOL SUCCINATE ER 25 MG PO TB24: 25.0000 mg | ORAL_TABLET | Freq: Every day | ORAL | 3 refills | Status: DC

## 2022-06-27 NOTE — Patient Instructions (Signed)
Medication Instructions:  Your physician has recommended you make the following change in your medication:   START: Metoprolol Succinate 5m daily at bedtime  *If you need a refill on your cardiac medications before your next appointment, please call your pharmacy*   Lab Work: none If you have labs (blood work) drawn today and your tests are completely normal, you will receive your results only by: [place](if you have MyChart) OR A paper copy in the mail If you have any lab test that is abnormal or we need to change your treatment, we will call you to review the results.   Follow-Up: [facility] and your health needs are our priority.  As part of our continuing mission to provide you with exceptional heart care, we have created designated Provider Care Teams.  These Care Teams include your primary Cardiologist (physician) and Advanced Practice Providers (APPs -  Physician Assistants and Nurse Practitioners) who all work together to provide you with the care you need, when you need it.   Your next appointment:   6 month(s)  Provider:   WAllegra Lai MD

## 2022-06-28 ENCOUNTER — Other Ambulatory Visit: Payer: Self-pay

## 2022-06-28 ENCOUNTER — Emergency Department (HOSPITAL_COMMUNITY)
Admission: EM | Admit: 2022-06-28 | Discharge: 2022-06-28 | Disposition: A | Discharge status: Home / Self Care | Payer: PPO | Attending: Cardiovascular Disease | Admitting: Cardiovascular Disease

## 2022-06-28 ENCOUNTER — Encounter: Payer: Self-pay | Admitting: Cardiology

## 2022-06-28 ENCOUNTER — Encounter (HOSPITAL_COMMUNITY): Payer: Self-pay | Admitting: Emergency Medicine

## 2022-06-28 ENCOUNTER — Emergency Department (HOSPITAL_COMMUNITY): Payer: PPO

## 2022-06-28 DIAGNOSIS — S0990XA Unspecified injury of head, initial encounter: Secondary | ICD-10-CM

## 2022-06-28 DIAGNOSIS — W19XXXA Unspecified fall, initial encounter: Secondary | ICD-10-CM | POA: Diagnosis not present

## 2022-06-28 DIAGNOSIS — Z23 Encounter for immunization: Secondary | ICD-10-CM | POA: Diagnosis not present

## 2022-06-28 DIAGNOSIS — Y92009 Unspecified place in unspecified non-institutional (private) residence as the place of occurrence of the external cause: Secondary | ICD-10-CM | POA: Insufficient documentation

## 2022-06-28 DIAGNOSIS — I2510 Atherosclerotic heart disease of native coronary artery without angina pectoris: Secondary | ICD-10-CM | POA: Diagnosis not present

## 2022-06-28 DIAGNOSIS — S0181XA Laceration without foreign body of other part of head, initial encounter: Secondary | ICD-10-CM | POA: Insufficient documentation

## 2022-06-28 DIAGNOSIS — S0003XA Contusion of scalp, initial encounter: Secondary | ICD-10-CM | POA: Diagnosis not present

## 2022-06-28 DIAGNOSIS — Z950 Presence of cardiac pacemaker: Secondary | ICD-10-CM | POA: Diagnosis not present

## 2022-06-28 DIAGNOSIS — Z87891 Personal history of nicotine dependence: Secondary | ICD-10-CM | POA: Insufficient documentation

## 2022-06-28 DIAGNOSIS — S0101XA Laceration without foreign body of scalp, initial encounter: Secondary | ICD-10-CM | POA: Diagnosis not present

## 2022-06-28 DIAGNOSIS — Z7982 Long term (current) use of aspirin: Secondary | ICD-10-CM | POA: Diagnosis not present

## 2022-06-28 LAB — CBC WITH DIFFERENTIAL/PLATELET
Abs Immature Granulocytes: 0.02 10*3/uL (ref 0.00–0.07)
Basophils Absolute: 0 10*3/uL (ref 0.0–0.1)
Basophils Relative: 1 %
Eosinophils Absolute: 0.4 10*3/uL (ref 0.0–0.5)
Eosinophils Relative: 6 %
HCT: 34 % — ABNORMAL LOW (ref 39.0–52.0)
Hemoglobin: 11.1 g/dL — ABNORMAL LOW (ref 13.0–17.0)
Immature Granulocytes: 0 %
Lymphocytes Relative: 11 %
Lymphs Abs: 0.7 10*3/uL (ref 0.7–4.0)
MCH: 33.9 pg (ref 26.0–34.0)
MCHC: 32.6 g/dL (ref 30.0–36.0)
MCV: 104 fL — ABNORMAL HIGH (ref 80.0–100.0)
Monocytes Absolute: 0.6 10*3/uL (ref 0.1–1.0)
Monocytes Relative: 10 %
Neutro Abs: 4.6 10*3/uL (ref 1.7–7.7)
Neutrophils Relative %: 72 %
Platelets: 144 10*3/uL — ABNORMAL LOW (ref 150–400)
RBC: 3.27 MIL/uL — ABNORMAL LOW (ref 4.22–5.81)
RDW: 12.5 % (ref 11.5–15.5)
WBC: 6.2 10*3/uL (ref 4.0–10.5)
nRBC: 0 % (ref 0.0–0.2)

## 2022-06-28 LAB — BASIC METABOLIC PANEL
Anion gap: 9 (ref 5–15)
BUN: 24 mg/dL — ABNORMAL HIGH (ref 8–23)
CO2: 28 mmol/L (ref 22–32)
Calcium: 8.7 mg/dL — ABNORMAL LOW (ref 8.9–10.3)
Chloride: 100 mmol/L (ref 98–111)
Creatinine, Ser: 0.92 mg/dL (ref 0.61–1.24)
GFR, Estimated: >60 mL/min (ref >60)
Glucose, Bld: 125 mg/dL — ABNORMAL HIGH (ref 70–99)
Potassium: 3.9 mmol/L (ref 3.5–5.1)
Sodium: 137 mmol/L (ref 135–145)

## 2022-06-28 MED ORDER — SODIUM CHLORIDE 0.9 % IV BOLUS
1000.0000 mL | Freq: Once | INTRAVENOUS | Status: AC
  Administered 2022-06-28: 1000 mL via INTRAVENOUS

## 2022-06-28 MED ORDER — MIDODRINE HCL 5 MG PO TABS
5.0000 mg | ORAL_TABLET | Freq: Three times a day (TID) | ORAL | Status: DC
  Administered 2022-06-28: 5 mg
  Filled 2022-06-28: qty 1

## 2022-06-28 MED ORDER — TETANUS-DIPHTH-ACELL PERTUSSIS 5-2.5-18.5 LF-MCG/0.5 IM SUSY
0.5000 mL | PREFILLED_SYRINGE | Freq: Once | INTRAMUSCULAR | Status: AC
  Administered 2022-06-28: 0.5 mL via INTRAMUSCULAR
  Filled 2022-06-28: qty 0.5

## 2022-06-28 NOTE — Discharge Instructions (Addendum)
Do not take your metoprolol until you speak with your cardiologist again.  Let them know what happened

## 2022-06-28 NOTE — ED Notes (Signed)
Attempted to stand patient. He became very dizzy. Another liter of fluids ordered.

## 2022-06-28 NOTE — ED Triage Notes (Signed)
Patient got up to use the restroom and stumbled and fell into the dresser. Bleeding uncontrolled at this time. Takes aspirin.

## 2022-06-28 NOTE — ED Provider Notes (Signed)
[facility] DEPT Provider Note: Georgena Spurling, MD, FACEP  CSN: [id number] MRN: [id number] ARRIVAL: 06/28/22 at [place]: RESA/RESA   CHIEF COMPLAINT  Head Injury   HISTORY OF PRESENT ILLNESS  06/28/22 patient seen on arrival to resuscitation room Clarence Andrade is a 77 y.o. male who got up to go to the bathroom just prior to arrival.  He lost his balance and fell and struck his left temple on the corner of a dresser.  He has a laceration there with pulsatile hemorrhage.  He is on aspirin.  There was no loss of consciousness.  He is having no vomiting.  He denies neck pain.  He rates pain at the injury site is a 5 out of 10.  Tetanus is not up-to-date.  Significant blood loss prior to arrival.  He is on midodrine for hypotension (SBP 80s and 90s) associated with his chemotherapy.  He frequently has low blood pressures.   Past Medical History:  Diagnosis Date   Abdominal hernia    Midline   Adrenal incidentaloma ([place]) 2010   Aortic stenosis    Ascending aortic aneurysm (HCC)    CAD (coronary artery disease)    a. Cath 03/2018 - CTO of the prox-mid LAD with bridging, L-L and R-L collaterals, moderate, non-obstructive disease involving the proximal LAD, proximal LCx, and distal RCA, more severe disease noted involving small branch vessels (RV marginal and superior branch of OM3).   Complete heart block (HCC)    Gallstones    Heart murmur    History of pacemaker    a. 11/2018 - Medtronic, intermittent CHB.   Hyperlipidemia    Morbid obesity (HCC)    Myocardial infarction [facility])    NSVT (nonsustained ventricular tachycardia) (HCC)    OSA (obstructive sleep apnea)    Severe PSG 1/07 AHI 66/hr, O2 Nadir 50% Refuses treatment - Pt does not believe test was accurate   Pericarditis    Pneumonia    Pulmonary HTN (HCC)    mild with PASP 89mHg by echo 02/2018, not seen on 03/2020 echo   Pulmonary nodule    Screening for AAA (abdominal aortic aneurysm) 2010   CT Abd/Pelvis    [place]    Ventral hernia    03/2009    Past Surgical History:  Procedure Laterality Date   CHOLECYSTECTOMY N/A 10/11/2019   Procedure: LAPAROSCOPIC CHOLECYSTECTOMY WITH INTRAOPERATIVE CHOLANGIOGRAM;  Surgeon: GArmandina Gemma MD;  Location: [facility];  Service: General;  Laterality: N/A;   CORONARY ANGIOGRAPHY N/A 03/23/2018   Procedure: CORONARY ANGIOGRAPHY;  Surgeon: ENelva Bush MD;  [facility];  Service: Cardiovascular;  Laterality: N/A;   HERNIA REPAIR  05/2002   three   IR GASTROSTOMY TUBE MOD SED  03/08/2022   IR GJ TUBE CHANGE  04/15/2022   IR IMAGING GUIDED PORT INSERTION  03/08/2022   KNEE ARTHROSCOPY Left 09/2002   PACEMAKER IMPLANT N/A 11/08/2018   Procedure: PACEMAKER IMPLANT;  Surgeon: CConstance Haw MD;  [facility];  Service: Cardiovascular;  Laterality: N/A;   SHOULDER SURGERY Right 2020   TOTAL KNEE ARTHROPLASTY Bilateral 09/2010   VENTRAL HERNIA REPAIR  03/2009    Family History  Problem Relation Age of Onset   Heart attack Father    Heart disease Father    Aneurysm Father    Breast cancer Sister    Colon cancer Sister     Social History   Tobacco Use   Smoking status:  Former    Packs/day: 0.50    Types: Cigarettes    Quit date: 1993    Years since quitting: 31.1   Smokeless tobacco: Never  Vaping Use   Vaping Use: Never used  Substance Use Topics   Alcohol use: No   Drug use: No    Prior to Admission medications   Medication Sig Start Date End Date Taking? Authorizing Provider  acetaminophen (TYLENOL) 500 MG tablet Place 1,000 mg into feeding tube every 6 (six) hours as needed (for pain).    [provider]  aspirin EC 81 MG tablet 81 mg See admin instructions. 81 mg, per tube, once a day    [provider]  buPROPion (WELLBUTRIN XL) 150 MG 24 hr tablet 150 mg See admin instructions. 150 mg, per tube, every morning 01/18/21   [provider]   feeding supplement, ENSURE COMPLETE, (ENSURE COMPLETE) LIQD Place 237 mLs into feeding tube See admin instructions. Mix 237 ml's of Ensure Complete with 237 ml's of milk = 3 feedings a day    [provider]  metoprolol succinate (TOPROL XL) 25 MG 24 hr tablet Take 1 tablet (25 mg total) by mouth at bedtime. 06/27/22   Mamie Levers, NP  midodrine (PROAMATINE) 5 MG tablet Take 1 tablet (5 mg total) by mouth 3 (three) times daily with meals. 05/27/22   Dunn, Nedra Hai, PA-C  nitroGLYCERIN (NITROSTAT) 0.4 MG SL tablet Place 1 tablet (0.4 mg total) under the tongue every 5 (five) minutes as needed for chest pain. 03/19/18   Nahser, Wonda Cheng, MD  rosuvastatin (CRESTOR) 10 MG tablet TAKE 1 TABLET ONCE DAILY. 02/10/21   Dunn, Nedra Hai, PA-C  tamsulosin (FLOMAX) 0.4 MG CAPS capsule Take 0.4 mg by mouth daily. 03/03/22   [provider]  temazepam (RESTORIL) 15 MG capsule Take 1 capsule (15 mg total) by mouth at bedtime as needed for sleep. 04/18/22   [name], MD  Water For Irrigation, Sterile (FREE WATER) SOLN Place 200 mLs into feeding tube every 8 (eight) hours. 05/07/22   Charlynne Cousins, MD    Allergies Sertraline hcl and Transderm-scop [scopolamine]   REVIEW OF SYSTEMS  Negative except as noted here or in the History of Present Illness.   PHYSICAL EXAMINATION  Initial Vital Signs Blood pressure 100/88, pulse 81, temperature 97.6 F (36.4 C), temperature source Oral, resp. rate 18, SpO2 92 %.  Examination General: Well-developed, well-nourished male in no acute distress; appearance consistent with age of record HENT: normocephalic; laceration left temple with pulsatile bleeding; small laceration with underlying hematoma left parietal scalp Eyes: pupils equal, round and reactive to light; extraocular muscles intact Neck: supple; nontender Heart: regular rate and rhythm Lungs: clear to auscultation bilaterally Abdomen: soft; nondistended; nontender; G-tube left  upper quadrant; bowel sounds present Extremities: No deformity; full range of motion Neurologic: Awake, alert and oriented; motor function intact in all extremities and symmetric; no facial droop Skin: Warm and dry Psychiatric: Normal mood and affect   RESULTS  Summary of this visit's results, reviewed and interpreted by myself:   EKG Interpretation  Date/Time:    Ventricular Rate:    PR Interval:    QRS Duration:   QT Interval:    QTC Calculation:   R Axis:     Text Interpretation:         Laboratory Studies: Results for orders placed or performed during the hospital encounter of 06/28/22 (from the past 24 hour(s))  CBC with Differential  Status: Abnormal   Collection Time: 06/28/22  5:44 AM  Result Value Ref Range   WBC 6.2 4.0 - 10.5 K/uL   RBC 3.27 (L) 4.22 - 5.81 MIL/uL   Hemoglobin 11.1 (L) 13.0 - 17.0 g/dL   HCT 34.0 (L) 39.0 - 52.0 %   MCV 104.0 (H) 80.0 - 100.0 fL   MCH 33.9 26.0 - 34.0 pg   MCHC 32.6 30.0 - 36.0 g/dL   RDW 12.5 11.5 - 15.5 %   Platelets 144 (L) 150 - 400 K/uL   nRBC 0.0 0.0 - 0.2 %   Neutrophils Relative % 72 %   Neutro Abs 4.6 1.7 - 7.7 K/uL   Lymphocytes Relative 11 %   Lymphs Abs 0.7 0.7 - 4.0 K/uL   Monocytes Relative 10 %   Monocytes Absolute 0.6 0.1 - 1.0 K/uL   Eosinophils Relative 6 %   Eosinophils Absolute 0.4 0.0 - 0.5 K/uL   Basophils Relative 1 %   Basophils Absolute 0.0 0.0 - 0.1 K/uL   Immature Granulocytes 0 %   Abs Immature Granulocytes 0.02 0.00 - 0.07 K/uL  Basic metabolic panel     Status: Abnormal   Collection Time: 06/28/22  5:44 AM  Result Value Ref Range   Sodium 137 135 - 145 mmol/L   Potassium 3.9 3.5 - 5.1 mmol/L   Chloride 100 98 - 111 mmol/L   CO2 28 22 - 32 mmol/L   Glucose, Bld 125 (H) 70 - 99 mg/dL   BUN 24 (H) 8 - 23 mg/dL   Creatinine, Ser 0.92 0.61 - 1.24 mg/dL   Calcium 8.7 (L) 8.9 - 10.3 mg/dL   GFR, Estimated >60 >60 mL/min   Anion gap 9 5 - 15   Imaging Studies: CT Head Wo  Contrast  Result Date: 06/28/2022 CLINICAL DATA:  77 year old male status post fall, left head laceration. EXAM: CT HEAD WITHOUT CONTRAST TECHNIQUE: Contiguous axial images were obtained from the base of the skull through the vertex without intravenous contrast. RADIATION DOSE REDUCTION: This exam was performed according to the departmental dose-optimization program which includes automated exposure control, adjustment of the mA and/or kV according to patient size and/or use of iterative reconstruction technique. COMPARISON:  Head CT 05/03/2022. FINDINGS: Brain: Cerebral volume remains normal for age. No midline shift, ventriculomegaly, mass effect, evidence of mass lesion, intracranial hemorrhage or evidence of cortically based acute infarction. Gray-white matter differentiation stable and within normal limits for age. Vascular: Calcified atherosclerosis at the skull base. No suspicious intracranial vascular hyperdensity. Skull: No fracture identified. Sinuses/Orbits: Visualized paranasal sinuses and mastoids are stable and well aerated. Other: Mild left posterior convexity scalp soft tissue injury on series 3, image 51 with trace soft tissue gas, mild regional hematoma. Underlying calvarium appears intact. Other orbit and scalp soft tissues appear stable. IMPRESSION: 1. Mild left posterior convexity scalp soft tissue injury without underlying skull fracture. 2. Stable and normal for age non contrast CT appearance of the brain. Electronically Signed   By: Genevie Ann M.D.   On: 06/28/2022 06:23   CUP PACEART INCLINIC DEVICE CHECK  Result Date: 06/27/2022 Pacemaker check in clinic. Normal device function. Thresholds, sensing, impedances consistent with previous measurements. Device programmed to maximize longevity. Frequent PVCs on presenting. Two brief NSVT episodes, less than 30 seconds. Device programmed at appropriate safety margins. Histogram distribution appropriate for patient activity level. Device  programmed to optimize intrinsic conduction. Estimated longevity 11 years. Patient enrolled in remote follow-up. Patient education completed.  ED COURSE and MDM  Nursing notes, initial and subsequent vitals signs, including pulse oximetry, reviewed and interpreted by myself.  Vitals:   06/28/22 0458 06/28/22 0503 06/28/22 0607  BP: 100/88  102/60  Pulse: 81    Resp: 18    Temp:  97.6 F (36.4 C)   TempSrc:  Oral   SpO2: 92%     Medications  Tdap (BOOSTRIX) injection 0.5 mL (0.5 mLs Intramuscular Given 06/28/22 0522)  sodium chloride 0.9 % bolus 1,000 mL (1,000 mLs Intravenous New Bag/Given 06/28/22 0547)    Wound closed emergently on arrival.  Tetanus updated as he is not current.   6:35 AM Parietal scalp not seen on primary survey but noted on head CT.  Laceration closed as described below.   6:50 AM Patient lightheaded on standing even after first liter of normal saline.  We will administer a second liter of normal saline.  It is unclear if he is lightheaded due to blood loss, head injury, or his known chemotherapy associated hypotension for which he takes midodrine.  6:58 AM Dr. Roderic Palau will reevaluate the patient and recheck a CBC after the patient's second fluid bolus is completed.   PROCEDURES  Procedures LACERATION REPAIR Performed by: Karen Chafe Sherell Christoffel Authorized by: Karen Chafe Billey Wojciak Consent: Verbal consent obtained. Risks and benefits: risks, benefits and alternatives were discussed Consent given by: patient Patient identity confirmed: provided demographic data Prepped and Draped in normal sterile fashion Wound explored, bleeding vessel cauterized with cautery pen   Laceration Location: Left temple  Laceration Length: 2.2 cm  No Foreign Bodies seen or palpated  Anesthesia: local infiltration  Local anesthetic: lidocaine 2% with epinephrine  Anesthetic total: 4 ml  Irrigation method: Wound cleaner Amount of cleaning: standard  Skin closure: 4-0  Vicryl  Number of sutures: 3  Technique: Simple interrupted  Patient tolerance: Patient tolerated the procedure well with no immediate complications.    LACERATION REPAIR Performed by: Karen Chafe Teresia Myint Authorized by: Karen Chafe Olson Lucarelli Consent: Verbal consent obtained. Risks and benefits: risks, benefits and alternatives were discussed Consent given by: patient Patient identity confirmed: provided demographic data Prepped and Draped in normal sterile fashion Wound explored  Laceration Location: Left parietal scalp  Laceration Length: 1 cm  No Foreign Bodies seen or palpated  Anesthesia: None  Irrigation method: syringe Amount of cleaning: standard  Skin closure: 1 staple  Patient tolerance: Patient tolerated the procedure well with no immediate complications.   ED DIAGNOSES     ICD-10-CM   1. Fall in home, initial encounter  W19.XXXA    Y92.009     2. Laceration of scalp with complication, initial encounter  S01.01XA     3. Laceration of scalp without complication, initial encounter  S01.01XA     4. Hematoma of scalp, initial encounter  S00.03XA          Rue Valladares, Jenny Reichmann, MD 06/28/22 0700

## 2022-07-06 DIAGNOSIS — Z4802 Encounter for removal of sutures: Secondary | ICD-10-CM | POA: Diagnosis not present

## 2022-07-12 DIAGNOSIS — R296 Repeated falls: Secondary | ICD-10-CM

## 2022-07-12 NOTE — Progress Notes (Addendum)
Clarence Andrade presents today for follow up for completion of radiation for tongue cancer. He completed treatment on 04-25-22.   Pain issues, if any: No Using a feeding tube?:  Patient to have feeding tube removed on Monday 08/01/22 Weight changes, if any:  Wt Readings from Last 3 Encounters:  07/26/22 240 lb (108.9 kg)  06/27/22 248 lb (112.5 kg)  06/22/22 246 lb 9.6 oz (111.9 kg)    Swallowing issues, if any: No. Patient states foods just does not taste the same.  Smoking or chewing tobacco? No Using fluoride trays daily? NA Last ENT visit was on: September 2023, patient has follow up appointment 09/07/22.  Other notable issues, if any:  Patient has multiple falls in the last 3 months.  Patient also reports voice changes. Reports increased nasal drainage making it difficult for patient to lay in bed. Patient sleeping in recliner.  Also has concerns about if his appetite will ever return to normal.   BP 99/60 (BP Location: Left Arm, Patient Position: Sitting)   Pulse 74   Temp (!) 97.1 F (36.2 C) (Temporal)   Resp 18   Ht 6' (1.829 m)   Wt 240 lb (108.9 kg)   SpO2 96%   BMI 32.55 kg/m

## 2022-07-21 ENCOUNTER — Encounter: Payer: Self-pay | Admitting: Hematology and Oncology

## 2022-07-21 DIAGNOSIS — C01 Malignant neoplasm of base of tongue: Secondary | ICD-10-CM | POA: Diagnosis not present

## 2022-07-21 DIAGNOSIS — I712 Thoracic aortic aneurysm, without rupture, unspecified: Secondary | ICD-10-CM | POA: Diagnosis not present

## 2022-07-21 DIAGNOSIS — Z0000 Encounter for general adult medical examination without abnormal findings: Secondary | ICD-10-CM | POA: Diagnosis not present

## 2022-07-21 DIAGNOSIS — D696 Thrombocytopenia, unspecified: Secondary | ICD-10-CM | POA: Diagnosis not present

## 2022-07-21 DIAGNOSIS — I714 Abdominal aortic aneurysm, without rupture, unspecified: Secondary | ICD-10-CM | POA: Diagnosis not present

## 2022-07-21 DIAGNOSIS — F331 Major depressive disorder, recurrent, moderate: Secondary | ICD-10-CM | POA: Diagnosis not present

## 2022-07-21 DIAGNOSIS — Z931 Gastrostomy status: Secondary | ICD-10-CM | POA: Diagnosis not present

## 2022-07-21 DIAGNOSIS — I25119 Atherosclerotic heart disease of native coronary artery with unspecified angina pectoris: Secondary | ICD-10-CM | POA: Diagnosis not present

## 2022-07-21 DIAGNOSIS — I700 Atherosclerosis of aorta: Secondary | ICD-10-CM | POA: Diagnosis not present

## 2022-07-21 DIAGNOSIS — I10 Essential (primary) hypertension: Secondary | ICD-10-CM | POA: Diagnosis not present

## 2022-07-21 DIAGNOSIS — I442 Atrioventricular block, complete: Secondary | ICD-10-CM | POA: Diagnosis not present

## 2022-07-25 ENCOUNTER — Ambulatory Visit (HOSPITAL_COMMUNITY)
Admission: RE | Admit: 2022-07-25 | Discharge: 2022-07-25 | Disposition: A | Discharge status: Home / Self Care | Payer: PPO | Source: Ambulatory Visit | Attending: Radiation Oncology | Admitting: Radiation Oncology

## 2022-07-25 DIAGNOSIS — C01 Malignant neoplasm of base of tongue: Secondary | ICD-10-CM | POA: Insufficient documentation

## 2022-07-25 LAB — GLUCOSE, CAPILLARY
Glucose-Capillary: 105 mg/dL — ABNORMAL HIGH (ref 70–99)
Glucose-Capillary: 74 mg/dL (ref 70–99)

## 2022-07-25 MED ORDER — FLUDEOXYGLUCOSE F - 18 (FDG) INJECTION
11.7300 | Freq: Once | INTRAVENOUS | Status: AC | PRN
  Administered 2022-07-25: 11.73 via INTRAVENOUS

## 2022-07-26 ENCOUNTER — Encounter: Payer: Self-pay | Admitting: Radiation Oncology

## 2022-07-26 ENCOUNTER — Ambulatory Visit
Admission: RE | Admit: 2022-07-26 | Discharge: 2022-07-26 | Disposition: A | Discharge status: Home / Self Care | Payer: PPO | Source: Ambulatory Visit | Attending: Radiation Oncology | Admitting: Radiation Oncology

## 2022-07-26 VITALS — BP 99/60 | HR 74 | Temp 97.1°F | Resp 18 | Ht 72.0 in | Wt 240.0 lb

## 2022-07-26 DIAGNOSIS — C01 Malignant neoplasm of base of tongue: Secondary | ICD-10-CM | POA: Diagnosis not present

## 2022-07-26 DIAGNOSIS — N400 Enlarged prostate without lower urinary tract symptoms: Secondary | ICD-10-CM | POA: Insufficient documentation

## 2022-07-26 DIAGNOSIS — Z79899 Other long term (current) drug therapy: Secondary | ICD-10-CM | POA: Diagnosis not present

## 2022-07-26 DIAGNOSIS — Z85810 Personal history of malignant neoplasm of tongue: Secondary | ICD-10-CM | POA: Diagnosis not present

## 2022-07-26 DIAGNOSIS — I517 Cardiomegaly: Secondary | ICD-10-CM | POA: Insufficient documentation

## 2022-07-26 DIAGNOSIS — I700 Atherosclerosis of aorta: Secondary | ICD-10-CM | POA: Insufficient documentation

## 2022-07-26 DIAGNOSIS — K5730 Diverticulosis of large intestine without perforation or abscess without bleeding: Secondary | ICD-10-CM | POA: Diagnosis not present

## 2022-07-26 DIAGNOSIS — Z923 Personal history of irradiation: Secondary | ICD-10-CM | POA: Insufficient documentation

## 2022-07-26 DIAGNOSIS — C109 Malignant neoplasm of oropharynx, unspecified: Secondary | ICD-10-CM | POA: Diagnosis not present

## 2022-07-26 NOTE — Progress Notes (Signed)
Radiation Oncology         [phone] ________________________________  Name: Clarence Andrade MRN: [id number]  Date: 07/26/2022  DOB: 08-12-1945  Follow-Up Visit Note  CC: Kathalene Frames, MD  Jenetta Downer, MD  Diagnosis and Prior Radiotherapy:       ICD-10-CM   1. Malignant neoplasm of base of tongue ([place]) [C01]  C01       Cancer Staging  Malignant neoplasm of base of tongue ([place]) Staging form: Pharynx - HPV-Mediated Oropharynx, AJCC 8th Edition - Clinical stage from 02/18/2022: Stage II (cT3, cN1, cM0, p16+) - Unsigned Stage prefix: Initial diagnosis  Squamous cell carcinoma of oropharynx ([facility]) Staging form: Pharynx - HPV-Mediated Oropharynx, AJCC 8th Edition - Clinical: Stage I (cT2, cN1, cM0, p16+) - Signed by Benay Pike, MD on 02/17/2022 Stage prefix: Initial diagnosis ==========DELIVERED PLANS==========  First Treatment Date: 2022-03-09 - Last Treatment Date: 2022-04-25   Plan Name: HN_BOT Site: Oropharynx Technique: IMRT Mode: Photon Dose Per Fraction: 2 Gy Prescribed Dose (Delivered / Prescribed): 66 Gy / 70 Gy Prescribed Fxs (Delivered / Prescribed): 33 / 35 (patient ended treatment prematurely)  CHIEF COMPLAINT:  Here for follow-up and surveillance of throat cancer  Narrative:  The patient returns today for routine follow-up.  Clarence Andrade presents for follow up for completion of radiation treatment for Squamous cell carcinoma of oropharynx . He completed treatment on 04-25-22.   Pain issues, if any: none Using a feeding tube? Just flushing now, doing ensures by mouth, trying more solid food,  Weight changes, if any: down 8 lbs in one month Wt Readings from Last 3 Encounters:  07/26/22 240 lb (108.9 kg)  06/27/22 248 lb (112.5 kg)  06/22/22 246 lb 9.6 oz (111.9 kg)    Swallowing issues, if any: no difficulty swallowing, nothing tastes good to pt Smoking or chewing tobacco? none Using fluoride trays daily? none Last ENT visit was on: none  since treatment Other notable issues, if any: energy level is extremely low and bp is low in the morning. Sees cardiology regarding BP issues. Doing ensures by mouth three times as a day, intermittent tremors. Patient's voice is still hoarse.                       ALLERGIES:  is allergic to sertraline hcl and transderm-scop [scopolamine].  Meds: Current Outpatient Medications  Medication Sig Dispense Refill   acetaminophen (TYLENOL) 500 MG tablet Place 1,000 mg into feeding tube every 6 (six) hours as needed (for pain).     aspirin EC 81 MG tablet 81 mg See admin instructions. 81 mg, per tube, once a day     buPROPion (WELLBUTRIN XL) 150 MG 24 hr tablet 150 mg See admin instructions. 150 mg, per tube, every morning     feeding supplement, ENSURE COMPLETE, (ENSURE COMPLETE) LIQD Place 237 mLs into feeding tube See admin instructions. Mix 237 ml's of Ensure Complete with 237 ml's of milk = 3 feedings a day     midodrine (PROAMATINE) 5 MG tablet Take 1 tablet (5 mg total) by mouth 3 (three) times daily with meals. 90 tablet 2   nitroGLYCERIN (NITROSTAT) 0.4 MG SL tablet Place 1 tablet (0.4 mg total) under the tongue every 5 (five) minutes as needed for chest pain. 25 tablet 3   rosuvastatin (CRESTOR) 10 MG tablet TAKE 1 TABLET ONCE DAILY. 90 tablet 0   tamsulosin (FLOMAX) 0.4 MG CAPS capsule Take 0.4 mg by mouth daily.  traZODone (DESYREL) 50 MG tablet Take 50 mg by mouth at bedtime. Patient only taking 25mg      Water For Irrigation, Sterile (FREE WATER) SOLN Place 200 mLs into feeding tube every 8 (eight) hours.     temazepam (RESTORIL) 15 MG capsule Take 1 capsule (15 mg total) by mouth at bedtime as needed for sleep. (Patient not taking: Reported on 07/26/2022) 30 capsule 0   No current facility-administered medications for this encounter.    Physical Findings: The patient is in no acute distress. Patient is alert and oriented. Wt Readings from Last 3 Encounters:  07/26/22 240 lb (108.9  kg)  06/27/22 248 lb (112.5 kg)  06/22/22 246 lb 9.6 oz (111.9 kg)    height is 6' (1.829 m) and weight is 240 lb (108.9 kg). His temporal temperature is 97.1 F (36.2 C) (abnormal). His blood pressure is 99/60 and his pulse is 74. His respiration is 18 and oxygen saturation is 96%. .  General: Alert and oriented, in no acute distress HEENT: Head is normocephalic. Extraocular movements are intact. Oropharynx is notable for no thrush or visible tumor externally Neck: Neck is notable for  no masses. Well healing skin in the radiation field.  Moderate anterior neck lymphedema present.   Skin: well healed skin in treatment fields Heart: Regular in rate and rhythm with murmur heard. Chest: Clear to auscultation bilaterally, with no rhonchi, wheezes, or rales. Lymphatics: see Neck Exam Psychiatric: Judgment and insight are intact. Affect is appropriate.   Lab Findings: Lab Results  Component Value Date   WBC 6.2 06/28/2022   HGB 11.1 (L) 06/28/2022   HCT 34.0 (L) 06/28/2022   MCV 104.0 (H) 06/28/2022   PLT 144 (L) 06/28/2022    Lab Results  Component Value Date   TSH 1.144 06/22/2022    Radiographic Findings: NM PET Image Restag (PS) Skull Base To Thigh  Result Date: 07/25/2022 CLINICAL DATA:  Subsequent treatment strategy for oropharyngeal malignancy. Prior chemotherapy and radiation therapy. EXAM: NUCLEAR MEDICINE PET SKULL BASE TO THIGH TECHNIQUE: 11.7 mCi F-18 FDG was injected intravenously. Full-ring PET imaging was performed from the skull base to thigh after the radiotracer. CT data was obtained and used for attenuation correction and anatomic localization. Fasting blood glucose: 105 mg/dl COMPARISON:  02/18/2022 FINDINGS: Mediastinal blood pool activity: SUV max 2.7 Liver activity: SUV max NA NECK: The masslike appearance on CT data and associated hypermetabolic activity along the left tongue base and oropharynx has essentially completely resolved. Maximum SUV in the vicinity is  along the midline of the tongue base at 4.4 and may well simply be physiologic activity, previously the left eccentric mass had a maximum SUV of 21.8. Density compatible with residuum of the previous left level IIa lymph node measures 1.2 cm in diameter on image 56 of series 4 with maximum SUV of 2.7. This previously measured 3.1 cm in short axis with maximum SUV of 21.2. Posterior glottic level activity is likely benign, maximum SUV 5.0, formerly 7.9. Physiologic muscular activity noted at the level of the thoracic inlet. Incidental CT findings: Bilateral common carotid atherosclerotic calcification. CHEST: No significant abnormal hypermetabolic activity in this region. Incidental CT findings: Right Port-A-Cath tip: Lower SVC. Dual lead left pacer noted. Coronary, aortic arch, and branch vessel atherosclerotic vascular disease. Aortic valve calcification noted. Mild cardiomegaly. ABDOMEN/PELVIS: Physiologic metabolic activity along the site of recent percutaneous gastrostomy placement. Previous diffuse prostate activity is now patchy and substantially reduced from prior. Incidental CT findings: Cholecystectomy. Percutaneous gastrostomy. Atherosclerosis  is present, including aortoiliac atherosclerotic disease. Sigmoid colon diverticulosis. Mild prostatomegaly. Right scrotal hydrocele. SKELETON: No significant abnormal hypermetabolic activity in this region. Incidental CT findings: Degenerative arthropathy of both hips. IMPRESSION: 1. The masslike appearance of the left tongue base and oropharynx has essentially completely resolved. Maximum SUV in the vicinity of the tongue base is 4.4 at the midline and may well be physiologic activity, previously the maximum SUV of the left eccentric mass had a maximum SUV of 21.8. 2. The previous left level IIA lymph node has significantly reduced in size and activity, currently 1.2 cm in diameter with maximum SUV of 2.7, formerly 3.1 cm in short axis with maximum SUV of 21.2. 3.  Previous diffuse prostate activity is now patchy and substantially reduced from prior. 4. Other imaging findings of potential clinical significance: Coronary, aortic valve, and branch vessel atherosclerotic disease. Mild cardiomegaly. Sigmoid colon diverticulosis. Right scrotal hydrocele. Degenerative arthropathy of both hips. 5. Aortic atherosclerosis. Aortic Atherosclerosis (ICD10-I70.0). Electronically Signed   By: Van Clines M.D.   On: 07/25/2022 12:15   CT Head Wo Contrast  Result Date: 06/28/2022 CLINICAL DATA:  77 year old male status post fall, left head laceration. EXAM: CT HEAD WITHOUT CONTRAST TECHNIQUE: Contiguous axial images were obtained from the base of the skull through the vertex without intravenous contrast. RADIATION DOSE REDUCTION: This exam was performed according to the departmental dose-optimization program which includes automated exposure control, adjustment of the mA and/or kV according to patient size and/or use of iterative reconstruction technique. COMPARISON:  Head CT 05/03/2022. FINDINGS: Brain: Cerebral volume remains normal for age. No midline shift, ventriculomegaly, mass effect, evidence of mass lesion, intracranial hemorrhage or evidence of cortically based acute infarction. Gray-white matter differentiation stable and within normal limits for age. Vascular: Calcified atherosclerosis at the skull base. No suspicious intracranial vascular hyperdensity. Skull: No fracture identified. Sinuses/Orbits: Visualized paranasal sinuses and mastoids are stable and well aerated. Other: Mild left posterior convexity scalp soft tissue injury on series 3, image 51 with trace soft tissue gas, mild regional hematoma. Underlying calvarium appears intact. Other orbit and scalp soft tissues appear stable. IMPRESSION: 1. Mild left posterior convexity scalp soft tissue injury without underlying skull fracture. 2. Stable and normal for age non contrast CT appearance of the brain.  Electronically Signed   By: Genevie Ann M.D.   On: 06/28/2022 06:23   CUP PACEART INCLINIC DEVICE CHECK  Result Date: 06/27/2022 Pacemaker check in clinic. Normal device function. Thresholds, sensing, impedances consistent with previous measurements. Device programmed to maximize longevity. Frequent PVCs on presenting. Two brief NSVT episodes, less than 30 seconds. Device programmed at appropriate safety margins. Histogram distribution appropriate for patient activity level. Device programmed to optimize intrinsic conduction. Estimated longevity 11 years. Patient enrolled in remote follow-up. Patient education completed.   Impression/Plan:    1) Head and Neck Cancer Status: Restaging PET scan fortunately showed NED recurrence.  Will get CT of the neck/chest with contrast in 9 months with a follow-up appointment with me to review results.   2) Nutritional Status: losing weight due to taste changes Wt Readings from Last 3 Encounters:  07/26/22 240 lb (108.9 kg)  06/27/22 248 lb (112.5 kg)  06/22/22 246 lb 9.6 oz (111.9 kg)  PEG tube: not using  3) Risk Factors: not smoking  4) Swallowing: functional, continue SLP. Mild lymphedema present on PE today, patient declining PT referral.   5) Dental: Encouraged to continue regular followup with dentistry, and dental hygiene including fluoride supplements such as  PreviDent 5000  6) Thyroid function:  follow w PCP or med onc annually  Lab Results  Component Value Date   TSH 1.144 06/22/2022    7) Other: Patient has experienced multiple falls in the past couple months. He is seeing cardiology for low BP, but denied PT referral today. PEG and PAC removal as scheduled.  He declines PT to help with rehabilitation.  He will talk to his prescribing provider regarding tamsulosin which may be contributing to his sense of dizziness when he stands up.  He understands the importance of counting to 20 when he stands up so that his blood pressure has a chance to  equalize   8) Follow-up: Patient with follow up with Dr. Chryl Heck in 6 months.  See me back in 9 months as above  On date of service, in total, I spent 35 minutes on this encounter. Patient was seen in person. _____________________________________   Leona Singleton, PA   [name], MD

## 2022-07-27 ENCOUNTER — Other Ambulatory Visit: Payer: Self-pay

## 2022-07-27 DIAGNOSIS — C01 Malignant neoplasm of base of tongue: Secondary | ICD-10-CM

## 2022-07-27 NOTE — Progress Notes (Signed)
Oncology Nurse Navigator Documentation   I met with Clarence Andrade and his wife briefly before his follow up appointment with Dr. Isidore Moos today. He is doing well today and denies any needs at this time. A CT chest/neck has been ordered to be completed in 9 months and then come in for follow up with Dr. Isidore Moos for results. They know to call me for any questions or concerns.   Harlow Asa RN, BSN, OCN Head & Neck Oncology Nurse [place] at [facility] Phone # [phone]  Fax # [phone]

## 2022-07-28 ENCOUNTER — Telehealth: Payer: Self-pay | Admitting: Hematology and Oncology

## 2022-07-28 ENCOUNTER — Encounter: Payer: Self-pay | Admitting: Hematology and Oncology

## 2022-07-28 NOTE — Telephone Encounter (Signed)
Spoke with patient confirming upcoming appointment  

## 2022-07-29 ENCOUNTER — Other Ambulatory Visit: Payer: Self-pay | Admitting: Student

## 2022-07-29 DIAGNOSIS — C01 Malignant neoplasm of base of tongue: Secondary | ICD-10-CM

## 2022-08-01 ENCOUNTER — Ambulatory Visit (HOSPITAL_COMMUNITY)
Admission: RE | Admit: 2022-08-01 | Discharge: 2022-08-01 | Disposition: A | Discharge status: Home / Self Care | Payer: PPO | Source: Ambulatory Visit | Attending: Hematology and Oncology | Admitting: Hematology and Oncology

## 2022-08-01 DIAGNOSIS — Z431 Encounter for attention to gastrostomy: Secondary | ICD-10-CM | POA: Diagnosis not present

## 2022-08-01 DIAGNOSIS — Z452 Encounter for adjustment and management of vascular access device: Secondary | ICD-10-CM | POA: Diagnosis not present

## 2022-08-01 DIAGNOSIS — Z4803 Encounter for change or removal of drains: Secondary | ICD-10-CM | POA: Diagnosis not present

## 2022-08-01 DIAGNOSIS — C01 Malignant neoplasm of base of tongue: Secondary | ICD-10-CM | POA: Diagnosis not present

## 2022-08-01 HISTORY — PX: IR REMOVAL TUN ACCESS W/ PORT W/O FL MOD SED: IMG2290

## 2022-08-01 HISTORY — PX: IR GASTROSTOMY TUBE REMOVAL: IMG5492

## 2022-08-01 MED ORDER — LIDOCAINE-EPINEPHRINE 1 %-1:100000 IJ SOLN
INTRAMUSCULAR | Status: AC
  Administered 2022-08-01: 10 mL via INTRADERMAL
  Filled 2022-08-01: qty 1

## 2022-08-04 ENCOUNTER — Ambulatory Visit (INDEPENDENT_AMBULATORY_CARE_PROVIDER_SITE_OTHER): Payer: PPO

## 2022-08-04 DIAGNOSIS — I442 Atrioventricular block, complete: Secondary | ICD-10-CM | POA: Diagnosis not present

## 2022-08-04 LAB — CUP PACEART REMOTE DEVICE CHECK
Battery Remaining Longevity: 131 mo
Battery Voltage: 3.02 V
Brady Statistic AP VP Percent: 1.36 %
Brady Statistic AP VS Percent: 7.79 %
Brady Statistic AS VP Percent: 5.42 %
Brady Statistic AS VS Percent: 85.43 %
Brady Statistic RA Percent Paced: 10.47 %
Brady Statistic RV Percent Paced: 6.78 %
Date Time Interrogation Session: 20240328094545
Implantable Lead Connection Status: 753985
Implantable Lead Connection Status: 753985
Implantable Lead Implant Date: 20200702
Implantable Lead Implant Date: 20200702
Implantable Lead Location: 753859
Implantable Lead Location: 753860
Implantable Lead Model: 5076
Implantable Lead Model: 5076
Implantable Pulse Generator Implant Date: 20200702
Lead Channel Impedance Value: 342 Ohm
Lead Channel Impedance Value: 342 Ohm
Lead Channel Impedance Value: 399 Ohm
Lead Channel Impedance Value: 437 Ohm
Lead Channel Pacing Threshold Amplitude: 0.5 V
Lead Channel Pacing Threshold Amplitude: 0.75 V
Lead Channel Pacing Threshold Pulse Width: 0.4 ms
Lead Channel Pacing Threshold Pulse Width: 0.4 ms
Lead Channel Sensing Intrinsic Amplitude: 2.25 mV
Lead Channel Sensing Intrinsic Amplitude: 2.25 mV
Lead Channel Sensing Intrinsic Amplitude: 7.625 mV
Lead Channel Sensing Intrinsic Amplitude: 7.625 mV
Lead Channel Setting Pacing Amplitude: 1.5 V
Lead Channel Setting Pacing Amplitude: 2 V
Lead Channel Setting Pacing Pulse Width: 0.4 ms
Lead Channel Setting Sensing Sensitivity: 2.8 mV
Zone Setting Status: 755011
Zone Setting Status: 755011

## 2022-08-22 ENCOUNTER — Other Ambulatory Visit: Payer: Self-pay | Admitting: Physician Assistant

## 2022-09-02 ENCOUNTER — Encounter: Payer: Self-pay | Admitting: Physician Assistant

## 2022-09-02 NOTE — Progress Notes (Unsigned)
Cardiology Office Note    Date:  09/06/2022   ID:  EMANUELE MCWHIRTER, DOB [birth year], MRN [id number]  PCP:  Emilio Aspen, MD  Cardiologist:  Armanda Magic, MD  Electrophysiologist:  Regan Lemming, MD   Chief Complaint: f/u CAD, hypotension  History of Present Illness:   Clarence Andrade is a 77 y.o. male with history of CAD by cath 03/2018 as below, ascending aortic aneurysm, aortic stenosis (mild by 04/2022 echo), CHB s/p Medtronic PPM 11/2018, pericarditis, prior hyperglycemia, hyponatremia, dyslipidemia, pulmonary nodule, adrenal incidentaloma, mild pulmonary HTN (normal by subsequent echoes), brief NSVT and frequent PVCs per PPM interrogations, essential HTN more recently complicated by hypotension, squamous cell carcinoma of oropharynx, morbid obesity, ventral hernia who presents for follow-up.    He carries remote diagnosis of pericarditis and also CAD. In 2019 he developed chest pain. Nuc was abnormal prompting cath with CTO of the prox-mid LAD with bridging, L-L and R-L collaterals, moderate non-obstructive disease involving the proximal LAD, proximal LCx, and distal RCA, more severe disease noted involving small branch vessels (RV marginal and superior branch of OM3). Cath notable for inability to cross the aortic valve due to insufficient catheter length complicated by tortuous subclavian artery and dilated ascending aorta. Antianginal therapy was titrated with recommendation to reserve CTO PCI for refractory angina. Cardiac MRI 04/2018 (ordered by Dr. Mayford Knife prior to cath) looking for recurrent pericarditis showed no evidence of this. He has chronic fatigue but has declined evaluation for OSA. He developed CHB requiring PPM 11/2018. Prior device interrogations have shown short runs of NSVT prompting metoprolol, which later had to be discontinued in 2023 due to hypotension. This was in the setting of multiple medical issues related to hypotension and falls in the setting of  squamous cell carcinoma of the oropharynx with problems with anasarca and electrolytes. He was started on midodrine. He could not d/c Flomax as he'd developed UTI off this. We have also followed his TAA, with last CTA 10/2021 4.5cm without interval change, continued pulm nodule without interval change (felt likely benign by radiology read in the past). Last 2D Echo 05/04/22 showed EF 55-60%, indeterminate diastolic parameters, normal RV, possible mild AS, borderline dilation of aortic root. He saw Sherie Don with EP on 06/2022 and was restarted Toprol 25mg  nightly due to frequent PVCs and 2 episodes of NSVT on device interrogation. He was seen in the ED the following day with hypotension and mechanical fall, so metoprolol was discontinued. He'd stood up quickly in the middle of the night to go to the bathroom, got his feet twisted underneath him, and struck his temple on the corner of a dresser. He was fortunately able to be discharged same day.  He returns for follow-up overall doing better the last few weeks. He has been able to restart going to the gym 5 days a week and swimming. His SBP has settled around the current range (~110) on midodrine. His stamina is not fully back yet but he has not had any angina, new dyspnea, edema, or syncope. Repeat device interrogation report from 3/28 showed 1 10-beat run NSVT. He has been asymptomatic with this - no palpitations.   Labwork independently reviewed: 06/2022 K 3.9, BUN 24, Cr 0.92, Hgb 11.1, plt 144, LFTs ok, TSH wnl 05/2022 Mg 1.6 04/2022 albumin 2.7, LFTs otherwise ok, TSH low at 0.315, trops low/flat 21-18 03/2021 LDL 61, trig 133  Past History   Past Medical History:  Diagnosis Date   Abdominal hernia  Midline   Adrenal incidentaloma (HCC) 2010   Aortic stenosis    Ascending aortic aneurysm (HCC)    CAD (coronary artery disease)    a. Cath 03/2018 - CTO of the prox-mid LAD with bridging, L-L and R-L collaterals, moderate, non-obstructive  disease involving the proximal LAD, proximal LCx, and distal RCA, more severe disease noted involving small branch vessels (RV marginal and superior branch of OM3).   Complete heart block (HCC)    Gallstones    Heart murmur    History of pacemaker    a. 11/2018 - Medtronic, intermittent CHB.   Hyperlipidemia    Morbid obesity (HCC)    Myocardial infarction [facility])    NSVT (nonsustained ventricular tachycardia) (HCC)    OSA (obstructive sleep apnea)    Severe PSG 1/07 AHI 66/hr, O2 Nadir 50% Refuses treatment - Pt does not believe test was accurate   Pericarditis    Pneumonia    Pulmonary HTN (HCC)    mild with PASP by echo 02/2018, not seen on 03/2020 echo   Pulmonary nodule    PVC's (premature ventricular contractions)    Screening for AAA (abdominal aortic aneurysm) 2010   CT Abd/Pelvis   Squamous cell carcinoma of oropharynx (HCC)    Ventral hernia    03/2009    Past Surgical History:  Procedure Laterality Date   CHOLECYSTECTOMY N/A 10/11/2019   Procedure: LAPAROSCOPIC CHOLECYSTECTOMY WITH INTRAOPERATIVE CHOLANGIOGRAM;  Surgeon: Darnell Level, MD;  Location: [facility];  Service: General;  Laterality: N/A;   CORONARY ANGIOGRAPHY N/A 03/23/2018   Procedure: CORONARY ANGIOGRAPHY;  Surgeon: Yvonne Kendall, MD;  [facility]: [facility];  Service: Cardiovascular;  Laterality: N/A;   HERNIA REPAIR  05/2002   three   IR GASTROSTOMY TUBE MOD SED  03/08/2022   IR GASTROSTOMY TUBE REMOVAL  08/01/2022   IR GJ TUBE CHANGE  04/15/2022   IR IMAGING GUIDED PORT INSERTION  03/08/2022   IR REMOVAL TUN ACCESS W/ PORT W/O FL MOD SED  08/01/2022   KNEE ARTHROSCOPY Left 09/2002   PACEMAKER IMPLANT N/A 11/08/2018   Procedure: PACEMAKER IMPLANT;  Surgeon: Regan Lemming, MD;  [facility]: [facility];  Service: Cardiovascular;  Laterality: N/A;   SHOULDER SURGERY Right 2020   TOTAL KNEE ARTHROPLASTY Bilateral 09/2010   VENTRAL HERNIA REPAIR  03/2009    Current  Medications: Current Meds  Medication Sig   acetaminophen (TYLENOL) 500 MG tablet Take 1,000 mg by mouth every 6 (six) hours as needed (for pain).   aspirin EC 81 MG tablet Take 81 mg by mouth daily.   buPROPion (WELLBUTRIN XL) 150 MG 24 hr tablet Take 150 mg by mouth daily. 150 mg, per tube, every morning   feeding supplement, ENSURE COMPLETE, (ENSURE COMPLETE) LIQD Take 237 mLs by mouth See admin instructions. Mix 237 ml's of Ensure Complete with 237 ml's of milk = 3 feedings a day   midodrine (PROAMATINE) 5 MG tablet Take 1 tablet (5 mg total) by mouth 3 (three) times daily with meals.   nitroGLYCERIN (NITROSTAT) 0.4 MG SL tablet Place 1 tablet (0.4 mg total) under the tongue every 5 (five) minutes as needed for chest pain.   rosuvastatin (CRESTOR) 10 MG tablet TAKE 1 TABLET ONCE DAILY.   tamsulosin (FLOMAX) 0.4 MG CAPS capsule Take 0.4 mg by mouth daily.   traZODone (DESYREL) 50 MG tablet Take 50 mg by mouth at bedtime. Patient only taking 25mg    [DISCONTINUED] Water For Irrigation, Sterile (FREE WATER) SOLN  Place 200 mLs into feeding tube every 8 (eight) hours.      Allergies:   Sertraline hcl and Transderm-scop [scopolamine]   Social History   Socioeconomic History   Marital status: Married    Spouse name: Not on file   Number of children: Not on file   Years of education: Not on file   Highest education level: Not on file  Occupational History   Not on file  Tobacco Use   Smoking status: Former    Packs/day: .5    Types: Cigarettes    Quit date: 76    Years since quitting: 31.3   Smokeless tobacco: Never  Vaping Use   Vaping Use: Never used  Substance and Sexual Activity   Alcohol use: No   Drug use: No   Sexual activity: Not Currently  Other Topics Concern   Not on file  Social History Narrative   Not on file   Social Determinants of Health   Financial Resource Strain: Low Risk  (02/24/2022)   Overall Financial Resource Strain (CARDIA)    Difficulty of  Paying Living Expenses: Not hard at all  Food Insecurity: No Food Insecurity (05/04/2022)   Hunger Vital Sign    Worried About Running Out of Food in the Last Year: Never true    Ran Out of Food in the Last Year: Never true  Transportation Needs: No Transportation Needs (05/04/2022)   PRAPARE - Administrator, Civil Service (Medical): No    Lack of Transportation (Non-Medical): No  Physical Activity: Not on file  Stress: Not on file  Social Connections: Not on file     Family History:  The patient's family history includes Aneurysm in his father; Breast cancer in his sister; Colon cancer in his sister; Heart attack in his father; Heart disease in his father.  ROS:   Please see the history of present illness.  All other systems are reviewed and otherwise negative.    EKG(s)/Additional Testing   EKG:  EKG is not ordered today but reviewed from 04/2022 with NSR 90bpm, first degree AVB, LAFB, nonspecific TW changes  CV Studies: Cardiac studies reviewed are outlined and summarized above. Otherwise please see EMR for full report.  Recent Labs: 05/03/2022: B Natriuretic Peptide 181.7 05/17/2022: Magnesium 1.6 06/22/2022: ALT 17; TSH 1.144 06/28/2022: BUN 24; Creatinine, Ser 0.92; Hemoglobin 11.1; Platelets 144; Potassium 3.9; Sodium 137  Recent Lipid Panel    Component Value Date/Time   CHOL 130 03/30/2021 1119   TRIG 133 03/30/2021 1119   HDL 46 03/30/2021 1119   CHOLHDL 2.8 03/30/2021 1119   CHOLHDL 3.2 06/05/2010 0715   VLDL 10 06/05/2010 0715   LDLCALC 61 03/30/2021 1119    PHYSICAL EXAM:    VS:  BP 110/74 (BP Location: Left Arm, Patient Position: Sitting, Cuff Size: Large)   Pulse 80   Ht 6' (1.829 m)   Wt 230 lb (104.3 kg)   SpO2 94%   BMI 31.19 kg/m   BMI: Body mass index is 31.19 kg/m.  GEN: Well nourished, well developed male in no acute distress HEENT: normocephalic, atraumatic Neck: no JVD, carotid bruits, or masses Cardiac: RRR; no murmurs, rubs,  or gallops, no edema  Respiratory:  clear to auscultation bilaterally, normal work of breathing GI: soft, nontender, nondistended, + BS MS: no deformity or atrophy Skin: warm and dry, no rash Neuro:  Alert and Oriented x 3, Strength and sensation are intact, follows commands Psych: euthymic mood, full affect  Wt Readings from Last 3 Encounters:  09/06/22 230 lb (104.3 kg)  07/26/22 240 lb (108.9 kg)  06/27/22 248 lb (112.5 kg)     ASSESSMENT & PLAN:   1. CAD, dyslipidemia - stable without recent angina. Remains off Imdur and metoprolol due to hypotension requiring midodrine. He was trialed on restart of low dose metoprolol but did not tolerate due to fall and low BP in 06/2022. Continue ASA. Continue rosvuastatin (not on higher dose due to h/o myalgias). Get CMET, lipid profile, CBC today with labs.  2. Hypotension - improved/stable on midodrine 5mg  TID. Continue present dose. My hope is that this may continue to improve as he rebounds from his substantially difficult oncologic complications from 2023, but we will need to follow. Advised to notify for any trending of blood pressures of 140 systolic or above at home. It seems we are quite a ways away from this happening.  3. Mild aortic stenosis - no specific intervention needed at this time. Consider repeat echo 3-5 years from 04/2022, sooner if clinically indicated.  4. Ascending aortic aneurysm - previously discussed aneurysm precautions in detail with patient. This was stable by CT 10/2021. He has requested 18 month follow-up instead of 12 month follow-up. He does have a CT Chest W Contrast recommended by oncology for 04/2023. Though not specifically timed for the aorta, this might suffice to assess the dimensions. We can see him back here in 6 months (02/2023) and discuss having him reach out once that study is completed so we can review the result. If aortic dimension not explicitly seen, would benefit from CT angio of the chest to  f/u.  5. H/o PPM, NSVT, PVCs - followed by EP. Did not tolerate even low dose metoprolol due to the issue with hypotension and fall. I think for the time that he is still requiring midodrine, we ought to avoid beta blocker until/if he is able to be weaned off this in the future. Discussed with Dr. Elberta Fortis today. Given relatively low burden of arrhythmia, he would not pursue amiodarone at this time. His device will be able to track the burden going further. We'll update TSH and electrolytes today.    Disposition: F/u with me in 6 months; EP as otherwise directed by APP 06/2022.   Medication Adjustments/Labs and Tests Ordered: Current medicines are reviewed at length with the patient today.  Concerns regarding medicines are outlined above. Medication changes, Labs and Tests ordered today are summarized above and listed in the Patient Instructions accessible in Encounters.   Signed, Laurann Montana, PA-C  09/06/2022 9:21 AM    [facility] Phone: [phone]; Fax: [phone]

## 2022-09-06 ENCOUNTER — Ambulatory Visit: Payer: PPO | Attending: Physician Assistant | Admitting: Physician Assistant

## 2022-09-06 ENCOUNTER — Encounter: Payer: Self-pay | Admitting: Physician Assistant

## 2022-09-06 VITALS — BP 110/74 | HR 80 | Ht 72.0 in | Wt 230.0 lb

## 2022-09-06 DIAGNOSIS — I493 Ventricular premature depolarization: Secondary | ICD-10-CM | POA: Diagnosis not present

## 2022-09-06 DIAGNOSIS — Z950 Presence of cardiac pacemaker: Secondary | ICD-10-CM | POA: Diagnosis not present

## 2022-09-06 DIAGNOSIS — I959 Hypotension, unspecified: Secondary | ICD-10-CM | POA: Diagnosis not present

## 2022-09-06 DIAGNOSIS — E785 Hyperlipidemia, unspecified: Secondary | ICD-10-CM | POA: Diagnosis not present

## 2022-09-06 DIAGNOSIS — I7121 Aneurysm of the ascending aorta, without rupture: Secondary | ICD-10-CM

## 2022-09-06 DIAGNOSIS — I350 Nonrheumatic aortic (valve) stenosis: Secondary | ICD-10-CM

## 2022-09-06 DIAGNOSIS — I2510 Atherosclerotic heart disease of native coronary artery without angina pectoris: Secondary | ICD-10-CM

## 2022-09-06 DIAGNOSIS — I4729 Other ventricular tachycardia: Secondary | ICD-10-CM | POA: Diagnosis not present

## 2022-09-06 NOTE — Patient Instructions (Addendum)
Medication Instructions:   Your physician recommends that you continue on your current medications as directed. Please refer to the Current Medication list given to you today.   *If you need a refill on your cardiac medications before your next appointment, please call your pharmacy*   Lab Work:  TODAY!!!!! CMET/MAG/LIPID/CMC/TSH  If you have labs (blood work) drawn today and your tests are completely normal, you will receive your results only by: MyChart Message (if you have MyChart) OR A paper copy in the mail If you have any lab test that is abnormal or we need to change your treatment, we will call you to review the results.   Testing/Procedures:  None ordered.   Follow-Up: At [facility], you and your health needs are our priority.  As part of our continuing mission to provide you with exceptional heart care, we have created designated Provider Care Teams.  These Care Teams include your primary Cardiologist (physician) and Advanced Practice Providers (APPs -  Physician Assistants and Nurse Practitioners) who all work together to provide you with the care you need, when you need it.  We recommend signing up for the patient portal called "MyChart".  Sign up information is provided on this After Visit Summary.  MyChart is used to connect with patients for Virtual Visits (Telemedicine).  Patients are able to view lab/test results, encounter notes, upcoming appointments, etc.  Non-urgent messages can be sent to your provider as well.   To learn more about what you can do with MyChart, go to [email or web address].    Your next appointment:   6 month(s)  Provider:   Ronie Spies, PA-C

## 2022-09-07 DIAGNOSIS — C770 Secondary and unspecified malignant neoplasm of lymph nodes of head, face and neck: Secondary | ICD-10-CM | POA: Diagnosis not present

## 2022-09-07 DIAGNOSIS — C109 Malignant neoplasm of oropharynx, unspecified: Secondary | ICD-10-CM | POA: Diagnosis not present

## 2022-09-07 DIAGNOSIS — T66XXXS Radiation sickness, unspecified, sequela: Secondary | ICD-10-CM | POA: Diagnosis not present

## 2022-09-07 DIAGNOSIS — I890 Lymphedema, not elsewhere classified: Secondary | ICD-10-CM | POA: Diagnosis not present

## 2022-09-07 LAB — COMPREHENSIVE METABOLIC PANEL
ALT: 17 IU/L (ref 0–44)
AST: 20 IU/L (ref 0–40)
Albumin/Globulin Ratio: 1.7 (ref 1.2–2.2)
Albumin: 3.9 g/dL (ref 3.8–4.8)
Alkaline Phosphatase: 76 IU/L (ref 44–121)
BUN/Creatinine Ratio: 32 — ABNORMAL HIGH (ref 10–24)
BUN: 24 mg/dL (ref 8–27)
Bilirubin Total: 0.5 mg/dL (ref 0.0–1.2)
CO2: 31 mmol/L — ABNORMAL HIGH (ref 20–29)
Calcium: 9.1 mg/dL (ref 8.6–10.2)
Chloride: 101 mmol/L (ref 96–106)
Creatinine, Ser: 0.74 mg/dL — ABNORMAL LOW (ref 0.76–1.27)
Globulin, Total: 2.3 g/dL (ref 1.5–4.5)
Glucose: 97 mg/dL (ref 70–99)
Potassium: 5 mmol/L (ref 3.5–5.2)
Sodium: 142 mmol/L (ref 134–144)
Total Protein: 6.2 g/dL (ref 6.0–8.5)
eGFR: 94 mL/min/{1.73_m2} (ref >59)

## 2022-09-07 LAB — CBC
Hematocrit: 40.1 % (ref 37.5–51.0)
Hemoglobin: 13.2 g/dL (ref 13.0–17.7)
MCH: 31.5 pg (ref 26.6–33.0)
MCHC: 32.9 g/dL (ref 31.5–35.7)
MCV: 96 fL (ref 79–97)
Platelets: 158 10*3/uL (ref 150–450)
RBC: 4.19 x10E6/uL (ref 4.14–5.80)
RDW: 11.8 % (ref 11.6–15.4)
WBC: 6.8 10*3/uL (ref 3.4–10.8)

## 2022-09-07 LAB — LIPID PANEL
Chol/HDL Ratio: 2.2 ratio (ref 0.0–5.0)
Cholesterol, Total: 120 mg/dL (ref 100–199)
HDL: 54 mg/dL (ref >39)
LDL Chol Calc (NIH): 54 mg/dL (ref 0–99)
Triglycerides: 54 mg/dL (ref 0–149)
VLDL Cholesterol Cal: 12 mg/dL (ref 5–40)

## 2022-09-07 LAB — TSH: TSH: 2.29 u[IU]/mL (ref 0.450–4.500)

## 2022-09-07 LAB — MAGNESIUM: Magnesium: 2.3 mg/dL (ref 1.6–2.3)

## 2022-09-08 DIAGNOSIS — C109 Malignant neoplasm of oropharynx, unspecified: Secondary | ICD-10-CM | POA: Diagnosis not present

## 2022-09-08 DIAGNOSIS — I890 Lymphedema, not elsewhere classified: Secondary | ICD-10-CM | POA: Insufficient documentation

## 2022-09-08 DIAGNOSIS — T66XXXA Radiation sickness, unspecified, initial encounter: Secondary | ICD-10-CM | POA: Insufficient documentation

## 2022-09-08 DIAGNOSIS — T66XXXS Radiation sickness, unspecified, sequela: Secondary | ICD-10-CM | POA: Diagnosis not present

## 2022-09-08 DIAGNOSIS — C770 Secondary and unspecified malignant neoplasm of lymph nodes of head, face and neck: Secondary | ICD-10-CM | POA: Diagnosis not present

## 2022-09-12 ENCOUNTER — Telehealth: Payer: Self-pay

## 2022-09-12 NOTE — Progress Notes (Signed)
Remote pacemaker transmission.   

## 2022-09-12 NOTE — Telephone Encounter (Signed)
RN called pt to reassure him that the throat swelling and lingering voice concerns that he is experiencing is normal for radiation recovery (per Dr. Basilio Cairo). Rn also encouraged him that he is great hands with this ENT and they would help keep an eye out on everything as well. This would include scopes about every three months. He was encouraged with this news and knows to call us with any concerns or questions.

## 2022-09-13 ENCOUNTER — Encounter: Payer: Self-pay | Admitting: Podiatry

## 2022-09-13 ENCOUNTER — Ambulatory Visit: Payer: PPO | Admitting: Podiatry

## 2022-09-13 DIAGNOSIS — F331 Major depressive disorder, recurrent, moderate: Secondary | ICD-10-CM | POA: Insufficient documentation

## 2022-09-13 DIAGNOSIS — D2371 Other benign neoplasm of skin of right lower limb, including hip: Secondary | ICD-10-CM | POA: Diagnosis not present

## 2022-09-13 DIAGNOSIS — M7751 Other enthesopathy of right foot: Secondary | ICD-10-CM

## 2022-09-13 DIAGNOSIS — M2010 Hallux valgus (acquired), unspecified foot: Secondary | ICD-10-CM

## 2022-09-13 MED ORDER — DEXAMETHASONE SODIUM PHOSPHATE 120 MG/30ML IJ SOLN
2.0000 mg | Freq: Once | INTRAMUSCULAR | Status: AC
  Administered 2022-09-13: 2 mg via INTRA_ARTICULAR

## 2022-09-14 NOTE — Progress Notes (Signed)
Subjective:  Patient ID: Clarence Andrade, male    DOB: November 12, 1945,  MRN: [id number] HPI Chief Complaint  Patient presents with   Foot Pain    Sub 1st MPJ right - callused area x months, tender walking, no treatment   New Patient (Initial Visit)    Est pt 2018    77 y.o. male presents with the above complaint.   ROS: Denies fever chills nausea vomit muscle aches pains calf pain back pain chest pain shortness of breath.  Past Medical History:  Diagnosis Date   Abdominal hernia    Midline   Adrenal incidentaloma (HCC) 2010   Aortic stenosis    Ascending aortic aneurysm (HCC)    CAD (coronary artery disease)    a. Cath 03/2018 - CTO of the prox-mid LAD with bridging, L-L and R-L collaterals, moderate, non-obstructive disease involving the proximal LAD, proximal LCx, and distal RCA, more severe disease noted involving small branch vessels (RV marginal and superior branch of OM3).   Complete heart block (HCC)    Gallstones    Heart murmur    History of pacemaker    a. 11/2018 - Medtronic, intermittent CHB.   Hyperlipidemia    Morbid obesity (HCC)    Myocardial infarction [facility])    NSVT (nonsustained ventricular tachycardia) (HCC)    OSA (obstructive sleep apnea)    Severe PSG 1/07 AHI 66/hr, O2 Nadir 50% Refuses treatment - Pt does not believe test was accurate   Pericarditis    Pneumonia    Pulmonary HTN (HCC)    mild with PASP by echo 02/2018, not seen on 03/2020 echo   Pulmonary nodule    PVC's (premature ventricular contractions)    Screening for AAA (abdominal aortic aneurysm) 2010   CT Abd/Pelvis   Squamous cell carcinoma of oropharynx (HCC)    Ventral hernia    03/2009   Past Surgical History:  Procedure Laterality Date   CHOLECYSTECTOMY N/A 10/11/2019   Procedure: LAPAROSCOPIC CHOLECYSTECTOMY WITH INTRAOPERATIVE CHOLANGIOGRAM;  Surgeon: Darnell Level, MD;  Location: [facility];  Service: General;  Laterality: N/A;   CORONARY ANGIOGRAPHY N/A 03/23/2018    Procedure: CORONARY ANGIOGRAPHY;  Surgeon: Yvonne Kendall, MD;  [facility];  Service: Cardiovascular;  Laterality: N/A;   HERNIA REPAIR  05/2002   three   IR GASTROSTOMY TUBE MOD SED  03/08/2022   IR GASTROSTOMY TUBE REMOVAL  08/01/2022   IR GJ TUBE CHANGE  04/15/2022   IR IMAGING GUIDED PORT INSERTION  03/08/2022   IR REMOVAL TUN ACCESS W/ PORT W/O FL MOD SED  08/01/2022   KNEE ARTHROSCOPY Left 09/2002   PACEMAKER IMPLANT N/A 11/08/2018   Procedure: PACEMAKER IMPLANT;  Surgeon: Regan Lemming, MD;  [facility]: [facility];  Service: Cardiovascular;  Laterality: N/A;   SHOULDER SURGERY Right 2020   TOTAL KNEE ARTHROPLASTY Bilateral 09/2010   VENTRAL HERNIA REPAIR  03/2009    Current Outpatient Medications:    acetaminophen (TYLENOL) 500 MG tablet, Take 1,000 mg by mouth every 6 (six) hours as needed (for pain)., Disp: , Rfl:    aspirin EC 81 MG tablet, Take 81 mg by mouth daily., Disp: , Rfl:    buPROPion (WELLBUTRIN XL) 150 MG 24 hr tablet, Take 150 mg by mouth daily. 150 mg, per tube, every morning, Disp: , Rfl:    feeding supplement, ENSURE COMPLETE, (ENSURE COMPLETE) LIQD, Take 237 mLs by mouth See admin instructions. Mix 237 ml's of Ensure Complete with 237 ml's of  milk = 3 feedings a day, Disp: , Rfl:    midodrine (PROAMATINE) 5 MG tablet, Take 1 tablet (5 mg total) by mouth 3 (three) times daily with meals., Disp: 270 tablet, Rfl: 3   nitroGLYCERIN (NITROSTAT) 0.4 MG SL tablet, Place 1 tablet (0.4 mg total) under the tongue every 5 (five) minutes as needed for chest pain., Disp: 25 tablet, Rfl: 3   rosuvastatin (CRESTOR) 10 MG tablet, TAKE 1 TABLET ONCE DAILY., Disp: 90 tablet, Rfl: 0   tamsulosin (FLOMAX) 0.4 MG CAPS capsule, Take 0.4 mg by mouth daily., Disp: , Rfl:    traZODone (DESYREL) 50 MG tablet, Take 50 mg by mouth at bedtime. Patient only taking 25mg , Disp: , Rfl:   Allergies  Allergen Reactions   Sertraline Hcl Other (See Comments)     Tremor, HA, Decreased Libido   Transderm-Scop [Scopolamine] Other (See Comments)    Tremors, hallucinations, weakness   Review of Systems Objective:  There were no vitals filed for this visit.  General: Well developed, nourished, in no acute distress, alert and oriented x3   Dermatological: Skin is warm, dry and supple bilateral. Nails x 10 are well maintained; remaining integument appears unremarkable at this time. There are no open sores, no preulcerative lesions, no rash or signs of infection present.  Has a palpable fluctuant mass beneath the tibial sesamoid of the right foot.  With an overlying reactive hyperkeratotic lesion that appears to be discolored or hyperpigmented.  Vascular: Dorsalis Pedis artery and Posterior Tibial artery pedal pulses are 2/4 bilateral with immedate capillary fill time. Pedal hair growth present. No varicosities and no lower extremity edema present bilateral.   Neruologic: Grossly intact via light touch bilateral. Vibratory intact via tuning fork bilateral. Protective threshold with Semmes Wienstein monofilament intact to all pedal sites bilateral. Patellar and Achilles deep tendon reflexes 2+ bilateral. No Babinski or clonus noted bilateral.   Musculoskeletal: No gross boney pedal deformities bilateral. No pain, crepitus, or limitation noted with foot and ankle range of motion bilateral. Muscular strength 5/5 in all groups tested bilateral.  Gait: Unassisted, Nonantalgic.    Radiographs:  None taken  Assessment & Plan:   Assessment: Bursitis capsulitis tibial sesamoid area beneath the hyperkeratotic lesion right foot  Plan: Injected 2 mg of dexamethasone beneath the lesion.  Debrided the lesion today which was primarily hyperkeratotic tissue did demonstrate some deep bleeding when silver nitrate was applied as well and Betadine solution and a Band-Aid.  I will follow-up with him on any recurrence.     Naomee Nowland T. [place], [place]

## 2022-09-22 DIAGNOSIS — S40812A Abrasion of left upper arm, initial encounter: Secondary | ICD-10-CM | POA: Diagnosis not present

## 2022-10-17 NOTE — Therapy (Signed)
[place] [facility] [address] [place], [place], [address] Phone: [phone]   Fax:  [phone]  Patient Details  Name: Clarence Andrade MRN: [id number] Date of Birth: 02/19/1946 Referring Provider:  Lonie Peak, MD  Encounter Date: 10/17/2022  SPEECH THERAPY DISCHARGE SUMMARY  Visits from Start of Care: 2  Current functional level related to goals / functional outcomes: See below for pt's STG/LTGs at last attended appointment on 03-24-22. Pt did not make any follow up appointments to date. Assumed he does not want to cont with ST and will formally d/c.   SHORT TERM GOALS: Target date:  2 therapy visits (visit #3)       pt will complete HEP with rare min A  Baseline:03-24-22 Goal status: Ongoing   2.  pt will tell SLP why pt is completing HEP with modified independence Baseline:  Goal status: Ongoing   3.  pt will describe 3 overt s/s aspiration PNA with modified independence Baseline:  Goal status: Ongoing     LONG TERM GOALS: Target date:  6 therapy visits (visit #7)      pt will complete HEP with modified independnence in 2 sessions Baseline:  Goal status: Ongoing   2.  pt will describe how to modify HEP over time, and the timeline associated with reduction in HEP frequency with modified independence over two sessions Baseline:  Goal status: Ongoing   Remaining deficits: Unknown as this d/c was unexpected.    Education / Equipment: See therapy notes   Patient agrees to discharge. Patient goals were not met. Patient is being discharged due to not returning since the last visit.Marland Kitchen    Mercy Malena, CCC-SLP 10/17/2022, 10:41 AM  [place] [facility] [address], [address] [place], [place], [address] Phone: [phone]   Fax:  [phone]

## 2022-11-03 ENCOUNTER — Ambulatory Visit (INDEPENDENT_AMBULATORY_CARE_PROVIDER_SITE_OTHER): Payer: PPO

## 2022-11-03 DIAGNOSIS — I442 Atrioventricular block, complete: Secondary | ICD-10-CM | POA: Diagnosis not present

## 2022-11-03 DIAGNOSIS — E785 Hyperlipidemia, unspecified: Secondary | ICD-10-CM | POA: Diagnosis not present

## 2022-11-03 DIAGNOSIS — Z6830 Body mass index (BMI) 30.0-30.9, adult: Secondary | ICD-10-CM | POA: Diagnosis not present

## 2022-11-03 LAB — CUP PACEART REMOTE DEVICE CHECK
Battery Remaining Longevity: 128 mo
Battery Voltage: 3.02 V
Brady Statistic AP VP Percent: 2.63 %
Brady Statistic AP VS Percent: 11.38 %
Brady Statistic AS VP Percent: 6.59 %
Brady Statistic AS VS Percent: 79.4 %
Brady Statistic RA Percent Paced: 14.74 %
Brady Statistic RV Percent Paced: 9.22 %
Date Time Interrogation Session: 20240626223350
Implantable Lead Connection Status: 753985
Implantable Lead Connection Status: 753985
Implantable Lead Implant Date: 20200702
Implantable Lead Implant Date: 20200702
Implantable Lead Location: 753859
Implantable Lead Location: 753860
Implantable Lead Model: 5076
Implantable Lead Model: 5076
Implantable Pulse Generator Implant Date: 20200702
Lead Channel Impedance Value: 323 Ohm
Lead Channel Impedance Value: 323 Ohm
Lead Channel Impedance Value: 399 Ohm
Lead Channel Impedance Value: 418 Ohm
Lead Channel Pacing Threshold Amplitude: 0.5 V
Lead Channel Pacing Threshold Amplitude: 0.75 V
Lead Channel Pacing Threshold Pulse Width: 0.4 ms
Lead Channel Pacing Threshold Pulse Width: 0.4 ms
Lead Channel Sensing Intrinsic Amplitude: 2.75 mV
Lead Channel Sensing Intrinsic Amplitude: 2.75 mV
Lead Channel Sensing Intrinsic Amplitude: 8.75 mV
Lead Channel Sensing Intrinsic Amplitude: 8.75 mV
Lead Channel Setting Pacing Amplitude: 1.5 V
Lead Channel Setting Pacing Amplitude: 2 V
Lead Channel Setting Pacing Pulse Width: 0.4 ms
Lead Channel Setting Sensing Sensitivity: 2.8 mV
Zone Setting Status: 755011
Zone Setting Status: 755011

## 2022-11-17 NOTE — Progress Notes (Signed)
Remote pacemaker transmission.   

## 2022-12-09 DIAGNOSIS — I890 Lymphedema, not elsewhere classified: Secondary | ICD-10-CM | POA: Diagnosis not present

## 2022-12-09 DIAGNOSIS — C109 Malignant neoplasm of oropharynx, unspecified: Secondary | ICD-10-CM | POA: Diagnosis not present

## 2022-12-09 DIAGNOSIS — T66XXXS Radiation sickness, unspecified, sequela: Secondary | ICD-10-CM | POA: Diagnosis not present

## 2022-12-09 DIAGNOSIS — C770 Secondary and unspecified malignant neoplasm of lymph nodes of head, face and neck: Secondary | ICD-10-CM | POA: Diagnosis not present

## 2023-01-16 ENCOUNTER — Other Ambulatory Visit (HOSPITAL_BASED_OUTPATIENT_CLINIC_OR_DEPARTMENT_OTHER): Payer: Self-pay

## 2023-01-16 MED ORDER — COMIRNATY 30 MCG/0.3ML IM SUSY
0.3000 mL | PREFILLED_SYRINGE | INTRAMUSCULAR | 0 refills | Status: AC
  Filled 2023-01-16: qty 0.3, 1d supply, fill #0

## 2023-01-16 MED ORDER — FLUAD 0.5 ML IM SUSY
0.5000 mL | PREFILLED_SYRINGE | Freq: Once | INTRAMUSCULAR | 0 refills | Status: AC
  Filled 2023-01-16: qty 0.5, 1d supply, fill #0

## 2023-01-20 ENCOUNTER — Ambulatory Visit: Payer: PPO | Admitting: Physician Assistant

## 2023-01-25 DIAGNOSIS — H2513 Age-related nuclear cataract, bilateral: Secondary | ICD-10-CM | POA: Diagnosis not present

## 2023-01-25 DIAGNOSIS — H353131 Nonexudative age-related macular degeneration, bilateral, early dry stage: Secondary | ICD-10-CM | POA: Diagnosis not present

## 2023-01-25 DIAGNOSIS — H52203 Unspecified astigmatism, bilateral: Secondary | ICD-10-CM | POA: Diagnosis not present

## 2023-01-27 DIAGNOSIS — Z79899 Other long term (current) drug therapy: Secondary | ICD-10-CM | POA: Diagnosis not present

## 2023-01-27 DIAGNOSIS — C01 Malignant neoplasm of base of tongue: Secondary | ICD-10-CM | POA: Diagnosis not present

## 2023-01-27 DIAGNOSIS — I712 Thoracic aortic aneurysm, without rupture, unspecified: Secondary | ICD-10-CM | POA: Diagnosis not present

## 2023-01-27 DIAGNOSIS — E7800 Pure hypercholesterolemia, unspecified: Secondary | ICD-10-CM | POA: Diagnosis not present

## 2023-01-27 DIAGNOSIS — Z6839 Body mass index (BMI) 39.0-39.9, adult: Secondary | ICD-10-CM | POA: Diagnosis not present

## 2023-01-27 DIAGNOSIS — F331 Major depressive disorder, recurrent, moderate: Secondary | ICD-10-CM | POA: Diagnosis not present

## 2023-01-27 DIAGNOSIS — I10 Essential (primary) hypertension: Secondary | ICD-10-CM | POA: Diagnosis not present

## 2023-01-27 DIAGNOSIS — I25119 Atherosclerotic heart disease of native coronary artery with unspecified angina pectoris: Secondary | ICD-10-CM | POA: Diagnosis not present

## 2023-01-27 DIAGNOSIS — I714 Abdominal aortic aneurysm, without rupture, unspecified: Secondary | ICD-10-CM | POA: Diagnosis not present

## 2023-01-27 DIAGNOSIS — I442 Atrioventricular block, complete: Secondary | ICD-10-CM | POA: Diagnosis not present

## 2023-01-27 DIAGNOSIS — I700 Atherosclerosis of aorta: Secondary | ICD-10-CM | POA: Diagnosis not present

## 2023-01-27 DIAGNOSIS — C109 Malignant neoplasm of oropharynx, unspecified: Secondary | ICD-10-CM | POA: Diagnosis not present

## 2023-01-31 ENCOUNTER — Other Ambulatory Visit: Payer: Self-pay

## 2023-01-31 ENCOUNTER — Encounter: Payer: PPO | Admitting: Dietician

## 2023-01-31 ENCOUNTER — Inpatient Hospital Stay: Payer: PPO | Attending: Hematology and Oncology | Admitting: Hematology and Oncology

## 2023-01-31 VITALS — BP 113/69 | HR 66 | Temp 97.7°F | Resp 17 | Wt 209.0 lb

## 2023-01-31 DIAGNOSIS — R1319 Other dysphagia: Secondary | ICD-10-CM | POA: Insufficient documentation

## 2023-01-31 DIAGNOSIS — I10 Essential (primary) hypertension: Secondary | ICD-10-CM | POA: Insufficient documentation

## 2023-01-31 DIAGNOSIS — Z79899 Other long term (current) drug therapy: Secondary | ICD-10-CM | POA: Insufficient documentation

## 2023-01-31 DIAGNOSIS — Z803 Family history of malignant neoplasm of breast: Secondary | ICD-10-CM | POA: Insufficient documentation

## 2023-01-31 DIAGNOSIS — Z87891 Personal history of nicotine dependence: Secondary | ICD-10-CM | POA: Diagnosis not present

## 2023-01-31 DIAGNOSIS — C109 Malignant neoplasm of oropharynx, unspecified: Secondary | ICD-10-CM | POA: Diagnosis not present

## 2023-01-31 DIAGNOSIS — I890 Lymphedema, not elsewhere classified: Secondary | ICD-10-CM | POA: Insufficient documentation

## 2023-01-31 DIAGNOSIS — Z800 Family history of malignant neoplasm of digestive organs: Secondary | ICD-10-CM | POA: Insufficient documentation

## 2023-01-31 DIAGNOSIS — R432 Parageusia: Secondary | ICD-10-CM | POA: Diagnosis not present

## 2023-01-31 NOTE — Assessment & Plan Note (Signed)
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  He completed 6 weekly cycles of cisplatin and radiation on December 18.  No concern for recurrence. Continue annual TSH, ENT surveillance, CT imaging and FU with Dr Basilio Cairo as scheduled.  Post-radiation dysphagia and dysgeusia Persistent difficulty swallowing and altered taste perception, likely secondary to radiation therapy. No improvement noted despite swallowing exercises. -Continue current management and swallowing exercises. -Consider consultation with speech therapist for further evaluation and management.  Hypertension Blood pressure readings consistently in the low to mid 90s, likely due to significant weight loss and possible hereditary factors. Crestor dose reduced from 10mg  to 5mg  by GP. -Continue monitoring blood pressure at home. -Start Crestor 5mg  daily.  Lymphedema Evening exacerbation of lymphedema, improving with self-massage. -Continue self-massage and monitoring.  General Health Maintenance -Continue annual TSH monitoring due to risk of hypothyroidism post-radiation. -Schedule follow-up appointment in one year.

## 2023-01-31 NOTE — Progress Notes (Signed)
Clarence Andrade  Patient Care Team: Emilio Aspen, MD as PCP - General (Internal Medicine) Quintella Reichert, MD as PCP - Cardiology (Cardiology) Regan Lemming, MD as PCP - Electrophysiology (Cardiology) Malmfelt, Lise Auer, RN as Oncology Nurse Navigator Rachel Moulds, MD as Consulting Physician (Hematology and Oncology) Lonie Peak, MD as Consulting Physician (Radiation Oncology) Scarlette Ar, MD as Consulting Physician (Otolaryngology)  CHIEF COMPLAINTS/PURPOSE OF CONSULTATION:  Squamous cell carcinoma of the oropharynx  ASSESSMENT & PLAN:   Squamous cell carcinoma of oropharynx [facility]) This is a very pleasant 77 year old male patient with 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  He completed 6 weekly cycles of cisplatin and radiation on December 18.  No concern for recurrence. Continue annual TSH, ENT surveillance, CT imaging and FU with Dr Basilio Cairo as scheduled.  Post-radiation dysphagia and dysgeusia Persistent difficulty swallowing and altered taste perception, likely secondary to radiation therapy. No improvement noted despite swallowing exercises. -Continue current management and swallowing exercises. -Consider consultation with speech therapist for further evaluation and management.  Hypertension Blood pressure readings consistently in the low to mid 90s, likely due to significant weight loss and possible hereditary factors. Crestor dose reduced from 10mg  to 5mg  by GP. -Continue monitoring blood pressure at home. -Start Crestor 5mg  daily.  Lymphedema Evening exacerbation of lymphedema, improving with self-massage. -Continue self-massage and monitoring.  General Health Maintenance -Continue annual TSH monitoring due to risk of hypothyroidism post-radiation. -Schedule follow-up appointment in one year.   HISTORY  OF PRESENTING ILLNESS:   Clarence Andrade 77 y.o. male is here because of SCC oropharynx.  Oncologic History  This is a very pleasant 77 year old male patient who was seen by ENT with chief complaint of lump in his neck ongoing for the past 1 to 2 months.  Around July he suddenly noticed the lump in the neck.  He was initially treated by antibiotics but this has not changed.  He also has started noticing some difficulty swallowing and some dysgeusia.  He tells me that he has lost about 20 pounds of weight in the past 2 to 3 months but part of it could be intentional.  He was seen by Dr. Ernestene Kiel on September 20 for an initial consultation and he had flexible fiberoptic endoscopy the same day which noticed large base of tongue tumor.  This tumor measured at least 2 to 3 cm in diameter extending across midline.  He also had a CT on September 27 which confirmed 4 cm mainly exophytic mass centered at the left base of the tongue with at least 1 ipsilateral malignant node measuring nearly 4 cm with signs of extracapsular extension.  Findings consistent with metastatic squamous cell carcinoma.  PET/CT done on October 13 with no findings of metastatic disease, large left-sided hypermetabolic oropharyngeal neoplasm and adjacent left-sided metastatic lymphadenopathy.  Interval history  Discussed the use of AI scribe software for clinical Andrade transcription with the patient, who gave verbal consent to proceed.  History of Present Illness    The patient, a 77 year old with a history of head and neck cancer, presents with ongoing difficulty swallowing and loss of taste. Despite these challenges, the patient has been managing to consume soups, which he reports are easier to swallow. The loss of taste has been total, affecting all types of food, including sweets and chocolates, which the patient used to enjoy. The patient's wife reports that  the more the patient chews, the worse the taste becomes. The patient has  been performing swallowing exercises regularly but reports no improvement in symptoms.  In addition to the swallowing difficulty and loss of taste, the patient has been experiencing low blood pressure, which has resulted in episodes of lightheadedness upon standing. The patient has also lost a significant amount of weight, which has resulted in a decrease in muscle mass and strength. Despite these challenges, the patient has been maintaining an active lifestyle, including regular water exercises.   Rest of the pertinent 10 point ROS reviewed and neg.  Wt Readings from Last 3 Encounters:  01/31/23 209 lb (94.8 kg)  09/06/22 230 lb (104.3 kg)  07/26/22 240 lb (108.9 kg)     MEDICAL HISTORY:  Past Medical History:  Diagnosis Date   Abdominal hernia    Midline   Adrenal incidentaloma (HCC) 2010   Aortic stenosis    Ascending aortic aneurysm (HCC)    CAD (coronary artery disease)    a. Cath 03/2018 - CTO of the prox-mid LAD with bridging, L-L and R-L collaterals, moderate, non-obstructive disease involving the proximal LAD, proximal LCx, and distal RCA, more severe disease noted involving small branch vessels (RV marginal and superior branch of OM3).   Complete heart block (HCC)    Gallstones    Heart murmur    History of pacemaker    a. 11/2018 - Medtronic, intermittent CHB.   Hyperlipidemia    Morbid obesity (HCC)    Myocardial infarction [facility])    NSVT (nonsustained ventricular tachycardia) (HCC)    OSA (obstructive sleep apnea)    Severe PSG 1/07 AHI 66/hr, O2 Nadir 50% Refuses treatment - Pt does not believe test was accurate   Pericarditis    Pneumonia    Pulmonary HTN (HCC)    mild with PASP by echo 02/2018, not seen on 03/2020 echo   Pulmonary nodule    PVC's (premature ventricular contractions)    Screening for AAA (abdominal aortic aneurysm) 2010   CT Abd/Pelvis   Squamous cell carcinoma of oropharynx (HCC)    Ventral hernia    03/2009    SURGICAL  HISTORY: Past Surgical History:  Procedure Laterality Date   CHOLECYSTECTOMY N/A 10/11/2019   Procedure: LAPAROSCOPIC CHOLECYSTECTOMY WITH INTRAOPERATIVE CHOLANGIOGRAM;  Surgeon: Darnell Level, MD;  Location: [facility] ORS;  Service: General;  Laterality: N/A;   CORONARY ANGIOGRAPHY N/A 03/23/2018   Procedure: CORONARY ANGIOGRAPHY;  Surgeon: Yvonne Kendall, MD;  Location: [facility] INVASIVE CV LAB;  Service: Cardiovascular;  Laterality: N/A;   HERNIA REPAIR  05/2002   three   IR GASTROSTOMY TUBE MOD SED  03/08/2022   IR GASTROSTOMY TUBE REMOVAL  08/01/2022   IR GJ TUBE CHANGE  04/15/2022   IR IMAGING GUIDED PORT INSERTION  03/08/2022   IR REMOVAL TUN ACCESS W/ PORT W/O FL MOD SED  08/01/2022   KNEE ARTHROSCOPY Left 09/2002   PACEMAKER IMPLANT N/A 11/08/2018   Procedure: PACEMAKER IMPLANT;  Surgeon: Regan Lemming, MD;  Location: [facility] INVASIVE CV LAB;  Service: Cardiovascular;  Laterality: N/A;   SHOULDER SURGERY Right 2020   TOTAL KNEE ARTHROPLASTY Bilateral 09/2010   VENTRAL HERNIA REPAIR  03/2009    SOCIAL HISTORY: Social History   Socioeconomic History   Marital status: Married    Spouse name: Not on file   Number of children: Not on file   Years of education: Not on file   Highest education level: Not on file  Occupational History  Not on file  Tobacco Use   Smoking status: Former    Current packs/day: 0.00    Types: Cigarettes    Quit date: 1993    Years since quitting: 31.7   Smokeless tobacco: Never  Vaping Use   Vaping status: Never Used  Substance and Sexual Activity   Alcohol use: No   Drug use: No   Sexual activity: Not Currently  Other Topics Concern   Not on file  Social History Narrative   Not on file   Social Determinants of Health   Financial Resource Strain: Low Risk  (02/24/2022)   Overall Financial Resource Strain (CARDIA)    Difficulty of Paying Living Expenses: Not hard at all  Food Insecurity: No Food Insecurity (05/04/2022)   Hunger Vital Sign     Worried About Running Out of Food in the Last Year: Never true    Ran Out of Food in the Last Year: Never true  Transportation Needs: No Transportation Needs (05/04/2022)   PRAPARE - Administrator, Civil Service (Medical): No    Lack of Transportation (Non-Medical): No  Physical Activity: Not on file  Stress: Not on file  Social Connections: Not on file  Intimate Partner Violence: Not At Risk (05/04/2022)   Humiliation, Afraid, Rape, and Kick questionnaire    Fear of Current or Ex-Partner: No    Emotionally Abused: No    Physically Abused: No    Sexually Abused: No    FAMILY HISTORY: Family History  Problem Relation Age of Onset   Heart attack Father    Heart disease Father    Aneurysm Father    Breast cancer Sister    Colon cancer Sister     ALLERGIES:  is allergic to sertraline hcl and transderm-scop [scopolamine].  MEDICATIONS:  Current Outpatient Medications  Medication Sig Dispense Refill   acetaminophen (TYLENOL) 500 MG tablet Take 1,000 mg by mouth every 6 (six) hours as needed (for pain).     aspirin EC 81 MG tablet Take 81 mg by mouth daily.     buPROPion (WELLBUTRIN XL) 150 MG 24 hr tablet Take 150 mg by mouth daily. 150 mg, per tube, every morning     COVID-19 mRNA vaccine 2024-2025 (COMIRNATY) syringe Inject 0.3 mLs into the muscle. 0.3 mL 0   feeding supplement, ENSURE COMPLETE, (ENSURE COMPLETE) LIQD Take 237 mLs by mouth See admin instructions. Mix 237 ml's of Ensure Complete with 237 ml's of milk = 3 feedings a day     midodrine (PROAMATINE) 5 MG tablet Take 1 tablet (5 mg total) by mouth 3 (three) times daily with meals. 270 tablet 3   nitroGLYCERIN (NITROSTAT) 0.4 MG SL tablet Place 1 tablet (0.4 mg total) under the tongue every 5 (five) minutes as needed for chest pain. 25 tablet 3   rosuvastatin (CRESTOR) 10 MG tablet TAKE 1 TABLET ONCE DAILY. 90 tablet 0   tamsulosin (FLOMAX) 0.4 MG CAPS capsule Take 0.4 mg by mouth daily.     traZODone  (DESYREL) 50 MG tablet Take 50 mg by mouth at bedtime. Patient only taking 25mg      No current facility-administered medications for this visit.     PHYSICAL EXAMINATION: ECOG PERFORMANCE STATUS: 0 - Asymptomatic  Vitals:   01/31/23 1254  BP: 113/69  Pulse: 66  Resp: 17  Temp: 97.7 F (36.5 C)  SpO2: 97%    Filed Weights   01/31/23 1254  Weight: 209 lb (94.8 kg)     Physical  Exam Constitutional:      General: He is not in acute distress.    Appearance: Normal appearance.  HENT:     Mouth/Throat:     Mouth: Mucous membranes are moist.  Musculoskeletal:        General: No swelling.     Cervical back: Normal range of motion and neck supple. No rigidity.  Lymphadenopathy:     Cervical: No cervical adenopathy.  Skin:    General: Skin is warm and dry.  Neurological:     Mental Status: He is alert.    LABORATORY DATA:  I have reviewed the data as listed Lab Results  Component Value Date   WBC 6.8 09/06/2022   HGB 13.2 09/06/2022   HCT 40.1 09/06/2022   MCV 96 09/06/2022   PLT 158 09/06/2022     Chemistry      Component Value Date/Time   NA 142 09/06/2022 0913   K 5.0 09/06/2022 0913   CL 101 09/06/2022 0913   CO2 31 (H) 09/06/2022 0913   BUN 24 09/06/2022 0913   CREATININE 0.74 (L) 09/06/2022 0913   CREATININE 0.83 06/22/2022 1416      Component Value Date/Time   CALCIUM 9.1 09/06/2022 0913   ALKPHOS 76 09/06/2022 0913   AST 20 09/06/2022 0913   AST 23 06/22/2022 1416   ALT 17 09/06/2022 0913   ALT 17 06/22/2022 1416   BILITOT 0.5 09/06/2022 0913   BILITOT 0.8 06/22/2022 1416       RADIOGRAPHIC STUDIES: I have personally reviewed the radiological images as listed and agreed with the findings in the report. No results found.  All questions were answered. The patient knows to call the clinic with any problems, questions or concerns. I spent 30 minutes in the care of this patient including H and P, review of records, counseling and coordination  of care.     Rachel Moulds, MD 01/31/2023 4:37 PM

## 2023-02-02 ENCOUNTER — Ambulatory Visit (INDEPENDENT_AMBULATORY_CARE_PROVIDER_SITE_OTHER): Payer: PPO

## 2023-02-02 DIAGNOSIS — I442 Atrioventricular block, complete: Secondary | ICD-10-CM | POA: Diagnosis not present

## 2023-02-02 LAB — CUP PACEART REMOTE DEVICE CHECK
Battery Remaining Longevity: 124 mo
Battery Voltage: 3.01 V
Brady Statistic AP VP Percent: 21.57 %
Brady Statistic AP VS Percent: 15.6 %
Brady Statistic AS VP Percent: 21.14 %
Brady Statistic AS VS Percent: 41.69 %
Brady Statistic RA Percent Paced: 37.39 %
Brady Statistic RV Percent Paced: 42.71 %
Date Time Interrogation Session: 20240925223446
Implantable Lead Connection Status: 753985
Implantable Lead Connection Status: 753985
Implantable Lead Implant Date: 20200702
Implantable Lead Implant Date: 20200702
Implantable Lead Location: 753859
Implantable Lead Location: 753860
Implantable Lead Model: 5076
Implantable Lead Model: 5076
Implantable Pulse Generator Implant Date: 20200702
Lead Channel Impedance Value: 323 Ohm
Lead Channel Impedance Value: 323 Ohm
Lead Channel Impedance Value: 380 Ohm
Lead Channel Impedance Value: 418 Ohm
Lead Channel Pacing Threshold Amplitude: 0.5 V
Lead Channel Pacing Threshold Amplitude: 0.75 V
Lead Channel Pacing Threshold Pulse Width: 0.4 ms
Lead Channel Pacing Threshold Pulse Width: 0.4 ms
Lead Channel Sensing Intrinsic Amplitude: 2.875 mV
Lead Channel Sensing Intrinsic Amplitude: 2.875 mV
Lead Channel Sensing Intrinsic Amplitude: 6.5 mV
Lead Channel Sensing Intrinsic Amplitude: 6.5 mV
Lead Channel Setting Pacing Amplitude: 1.5 V
Lead Channel Setting Pacing Amplitude: 2 V
Lead Channel Setting Pacing Pulse Width: 0.4 ms
Lead Channel Setting Sensing Sensitivity: 2.8 mV
Zone Setting Status: 755011
Zone Setting Status: 755011

## 2023-02-15 NOTE — Progress Notes (Signed)
Remote pacemaker transmission.   

## 2023-03-13 NOTE — Progress Notes (Unsigned)
Cardiology Office Note    Date:  03/16/2023  ID:  Clarence Andrade, DOB Feb 06, 1946, MRN [id number] PCP:  Emilio Aspen, MD  Cardiologist:  Armanda Magic, MD  Electrophysiologist:  Regan Lemming, MD   Chief Complaint: f/u CAD, hypotension  History of Present Illness: .    Clarence Andrade is a 77 y.o. male with visit-pertinent history of CAD by cath 03/2018 as below, ascending aortic aneurysm, aortic stenosis (mild by 04/2022 echo), CHB s/p Medtronic PPM 11/2018, pericarditis, prior hyperglycemia, hyponatremia, dyslipidemia, pulmonary nodule, adrenal incidentaloma, mild pulmonary HTN (normal by subsequent echoes), brief NSVT and frequent PVCs per PPM interrogations, prior HTN more recently complicated by hypotension, squamous cell carcinoma of oropharynx, morbid obesity, ventral hernia who presents for follow-up.    He carries remote diagnosis of pericarditis. In 2019 he developed chest pain. Nuc was abnormal prompting cath with CTO of the prox-mid LAD with bridging, L-L and R-L collaterals, moderate non-obstructive disease involving the proximal LAD, proximal LCx, and distal RCA, more severe disease noted involving small branch vessels (RV marginal and superior branch of OM3). Cath notable for inability to cross the aortic valve due to insufficient catheter length complicated by tortuous subclavian artery and dilated ascending aorta. Antianginal therapy was titrated with recommendation to reserve CTO PCI for refractory angina. Cardiac MRI 04/2018 (ordered by Dr. Mayford Knife prior to cath) looking for recurrent pericarditis showed no evidence of this. He has chronic fatigue but has declined evaluation for OSA. He developed CHB requiring PPM 11/2018. Prior device interrogations have shown short runs of NSVT prompting metoprolol, which later had to be discontinued in 2023 due to hypotension. This was in the setting of multiple medical issues related to hypotension and falls in the setting of squamous  cell carcinoma of the oropharynx with problems with anasarca and electrolytes. He was started on midodrine. He could not d/c Flomax as he'd developed UTI off this. We have also followed his TAA, with last CTA 10/2021 4.5cm without interval change, continued pulm nodule without interval change (felt likely benign by radiology read in the past). Last 2D Echo 05/04/22 showed EF 55-60%, indeterminate diastolic parameters, normal RV, possible mild AS, borderline dilation of aortic root. He saw Sherie Don with EP on 06/2022 and was restarted Toprol 25mg  nightly due to frequent PVCs and 2 episodes of NSVT on device interrogation. He was seen in the ED the following day with hypotension and mechanical fall, so metoprolol was discontinued. He was fortunately able to be discharged same day. He has required midodrine for his blood pressure.  He returns for follow-up with his wife. He feels that he's overall doing well from cardiac standpoint without CP, SOB, palpitations, edema or syncope though does still struggle with tendency for dizziness upon standing and BPs in the 90s at home with midodrine on board. He has lost another 40lb in the last 6 months; reports appetite/taste never came back after his cancer. They do report it's leveled out in the past 6 weeks. Initial BP by CMA 88/58, recheck by me 98/72  Labwork independently reviewed: 08/2022 TSH, CBC, Mg wnl, K 5.0, Cr 0.74, LFTs ok, LDL 54, trig 54  ROS: .    Please see the history of present illness. +cold intolerance. All other systems are reviewed and otherwise negative.  Studies Reviewed: Marland Kitchen    EKG:  EKG is ordered today, personally reviewed, demonstrating NSR 67bpm long first degree AVB, left axis deviation, nonspecific TW changes, no acute change from prior  CV Studies: Cardiac studies reviewed are outlined and summarized above. Otherwise please see EMR for full report.   Current Reported Medications:.    Current Meds  Medication Sig    acetaminophen (TYLENOL) 500 MG tablet Take 1,000 mg by mouth every 6 (six) hours as needed (for pain).   aspirin EC 81 MG tablet Take 81 mg by mouth daily.   buPROPion (WELLBUTRIN XL) 150 MG 24 hr tablet Take 150 mg by mouth daily. 150 mg, per tube, every morning   COVID-19 mRNA vaccine 2024-2025 (COMIRNATY) syringe Inject 0.3 mLs into the muscle.   feeding supplement, ENSURE COMPLETE, (ENSURE COMPLETE) LIQD Take 237 mLs by mouth See admin instructions. Mix 237 ml's of Ensure Complete with 237 ml's of milk = 3 feedings a day   midodrine (PROAMATINE) 5 MG tablet Take 1 tablet (5 mg total) by mouth 3 (three) times daily with meals.   nitroGLYCERIN (NITROSTAT) 0.4 MG SL tablet Place 1 tablet (0.4 mg total) under the tongue every 5 (five) minutes as needed for chest pain.   rosuvastatin (CRESTOR) 10 MG tablet TAKE 1 TABLET ONCE DAILY.   tamsulosin (FLOMAX) 0.4 MG CAPS capsule Take 0.4 mg by mouth daily.   traZODone (DESYREL) 50 MG tablet Take 50 mg by mouth at bedtime. Patient only taking 25mg     Physical Exam:    VS:  BP 98/72   Pulse 80   Ht 6' (1.829 m)   Wt 200 lb 6.4 oz (90.9 kg)   SpO2 95%   BMI 27.18 kg/m    Wt Readings from Last 3 Encounters:  03/16/23 200 lb 6.4 oz (90.9 kg)  01/31/23 209 lb (94.8 kg)  09/06/22 230 lb (104.3 kg)    GEN: Well nourished, well developed in no acute distress NECK: No JVD; No carotid bruits CARDIAC: RRR, no murmurs, rubs, gallops RESPIRATORY:  Clear to auscultation without rales, wheezing or rhonchi  ABDOMEN: Soft, non-tender, non-distended EXTREMITIES:  No edema; No acute deformity   Asessement and Plan:.    1. Hypotension - this started around the time of his oropharyngeal cancer and profound weight loss associated with loss of taste. He remains on midodrine 5mg  TID. BP remains soft as above. Will titrate midodrine to 10mg  TID and recommend above knee compression stockings. Adequate nutrition encouraged. Will update echo to ensure no new changes.  He is not having any anginal symptoms whatsoever to suggest progression of CAD and no significant murmurs on exam. It does concern me that he has continued to lose weight. I will notify Dr. Al Pimple of his ongoing weight loss in case any additional expedited testing is needed. Has f/u CT scheduled in December. Will update CBC, BMET, TSH as well.  2. CAD, dyslipidemia - stable without recent angina. Remains off Imdur and metoprolol due to hypotension requiring midodrine. Continue baby aspirin as tolerated. Continue rosuvastatin (not on higher dose due to h/o malgias).  LDL was at goal in 08/2022.  3. Mild aortic stenosis - will reassess by echo above though I'm hopeful this has not progressed.  4. Ascending aortic aneurysm - previously discussed aneurysm precautions in detail with patient. This was stable by CT 10/2021. He has requested 18 month follow-up instead of 12 month follow-up. He does have a CT Chest W Contrast recommended by oncology for 04/2023. Though not specifically timed for the aorta, this might suffice to see the dimensions. If not explicitly stated, I will plan to arrange f/u CTA in follow-up. Will plan to review in follow-up  when I see him back.   5. H/o PPM, NSVT, PVCs - overdue for EP follow-up, will recommend he schedule at checkout. Not on BB due to baseline hypotension/inability to tolerate.    Disposition: F/u with me on/after 04/26/23 to allow time for his oncology CT to be completed.  Signed, Laurann Montana, PA-C

## 2023-03-16 ENCOUNTER — Ambulatory Visit: Payer: PPO | Attending: Physician Assistant | Admitting: Physician Assistant

## 2023-03-16 ENCOUNTER — Encounter: Payer: Self-pay | Admitting: Physician Assistant

## 2023-03-16 VITALS — BP 98/72 | HR 80 | Ht 72.0 in | Wt 200.4 lb

## 2023-03-16 DIAGNOSIS — E785 Hyperlipidemia, unspecified: Secondary | ICD-10-CM

## 2023-03-16 DIAGNOSIS — I959 Hypotension, unspecified: Secondary | ICD-10-CM | POA: Diagnosis not present

## 2023-03-16 DIAGNOSIS — I7121 Aneurysm of the ascending aorta, without rupture: Secondary | ICD-10-CM

## 2023-03-16 DIAGNOSIS — Z950 Presence of cardiac pacemaker: Secondary | ICD-10-CM

## 2023-03-16 DIAGNOSIS — I4729 Other ventricular tachycardia: Secondary | ICD-10-CM | POA: Diagnosis not present

## 2023-03-16 DIAGNOSIS — I350 Nonrheumatic aortic (valve) stenosis: Secondary | ICD-10-CM

## 2023-03-16 DIAGNOSIS — I2510 Atherosclerotic heart disease of native coronary artery without angina pectoris: Secondary | ICD-10-CM

## 2023-03-16 DIAGNOSIS — I493 Ventricular premature depolarization: Secondary | ICD-10-CM | POA: Diagnosis not present

## 2023-03-16 MED ORDER — MIDODRINE HCL 10 MG PO TABS: 10.0000 mg | ORAL_TABLET | Freq: Three times a day (TID) | ORAL | 3 refills | Status: DC

## 2023-03-16 NOTE — Patient Instructions (Addendum)
Medication Instructions:  INCREASE Midodrine to 10mg  Take 1 tablet three times a day (OK to use up what you have at home before picking up new prescription.) *If you need a refill on your cardiac medications before your next appointment, please call your pharmacy*   Lab Work: TODAY-BMET, CBC, TSH If you have labs (blood work) drawn today and your tests are completely normal, you will receive your results only by: MyChart Message (if you have MyChart) OR A paper copy in the mail If you have any lab test that is abnormal or we need to change your treatment, we will call you to review the results.   Testing/Procedures: Your physician has requested that you have an echocardiogram. Echocardiography is a painless test that uses sound waves to create images of your heart. It provides your doctor with information about the size and shape of your heart and how well your heart's chambers and valves are working. This procedure takes approximately one hour. There are no restrictions for this procedure. Please do NOT wear cologne, perfume, aftershave, or lotions (deodorant is allowed). Please arrive 15 minutes prior to your appointment time.  Please note: We ask at that you not bring children with you during ultrasound (echo/ vascular) testing. Due to room size and safety concerns, children are not allowed in the ultrasound rooms during exams. Our front office staff cannot provide observation of children in our lobby area while testing is being conducted. An adult accompanying a patient to their appointment will only be allowed in the ultrasound room at the discretion of the ultrasound technician under special circumstances. We apologize for any inconvenience.   Follow-Up: At [facility], you and your health needs are our priority.  As part of our continuing mission to provide you with exceptional heart care, we have created designated Provider Care Teams.  These Care Teams include your primary  Cardiologist (physician) and Advanced Practice Providers (APPs -  Physician Assistants and Nurse Practitioners) who all work together to provide you with the care you need, when you need it.  We recommend signing up for the patient portal called "MyChart".  Sign up information is provided on this After Visit Summary.  MyChart is used to connect with patients for Virtual Visits (Telemedicine).  Patients are able to view lab/test results, encounter notes, upcoming appointments, etc.  Non-urgent messages can be sent to your provider as well.   To learn more about what you can do with MyChart, go to [email or web address].    Your next appointment:   1 month(s) (after 04/26/23)  Provider:   Ronie Spies, PA-C       NEEDS APPT WITH DR CAMNITZ FIRST AVAILABLE Other Instructions: I would recommend that you speak with the prescriber of your Flomax/tamsulosin to determine if there are any alternatives to this medicine you can try that do not lower your blood pressure.  I would recommend to try using compression stockings that go above your knee to help prevent your blood pressure from dropping when you stand up. You can typically get these at a medical supply store or online.

## 2023-03-17 ENCOUNTER — Other Ambulatory Visit: Payer: Self-pay

## 2023-03-17 ENCOUNTER — Other Ambulatory Visit: Payer: Self-pay | Admitting: Medical Genetics

## 2023-03-17 DIAGNOSIS — C01 Malignant neoplasm of base of tongue: Secondary | ICD-10-CM

## 2023-03-17 DIAGNOSIS — Z006 Encounter for examination for normal comparison and control in clinical research program: Secondary | ICD-10-CM

## 2023-03-17 LAB — BASIC METABOLIC PANEL

## 2023-03-17 LAB — TSH

## 2023-03-17 LAB — CBC

## 2023-03-17 LAB — SPECIMEN STATUS REPORT

## 2023-03-20 ENCOUNTER — Other Ambulatory Visit: Payer: Self-pay

## 2023-03-20 DIAGNOSIS — C01 Malignant neoplasm of base of tongue: Secondary | ICD-10-CM

## 2023-03-21 ENCOUNTER — Other Ambulatory Visit: Payer: Self-pay

## 2023-03-21 ENCOUNTER — Telehealth: Payer: Self-pay | Admitting: *Deleted

## 2023-03-21 DIAGNOSIS — I959 Hypotension, unspecified: Secondary | ICD-10-CM

## 2023-03-21 DIAGNOSIS — I2510 Atherosclerotic heart disease of native coronary artery without angina pectoris: Secondary | ICD-10-CM

## 2023-03-21 DIAGNOSIS — E785 Hyperlipidemia, unspecified: Secondary | ICD-10-CM

## 2023-03-21 NOTE — Telephone Encounter (Signed)
Called patient to ask about setting up nutrition appt., lvm for a return call

## 2023-03-21 NOTE — Progress Notes (Signed)
Orders for CMP, TSH, and CBC placed per request of Ronie Spies, PA.

## 2023-03-22 ENCOUNTER — Encounter: Payer: Self-pay | Admitting: Nutrition

## 2023-03-22 NOTE — Progress Notes (Signed)
Per nurse navigator, patient declined a nutrition appointment for wt loss and taste alterations.

## 2023-04-13 ENCOUNTER — Other Ambulatory Visit (HOSPITAL_COMMUNITY)
Admission: RE | Admit: 2023-04-13 | Discharge: 2023-04-13 | Disposition: A | Discharge status: Home / Self Care | Payer: PPO | Source: Ambulatory Visit | Attending: Medical Genetics | Admitting: Medical Genetics

## 2023-04-13 DIAGNOSIS — E785 Hyperlipidemia, unspecified: Secondary | ICD-10-CM | POA: Insufficient documentation

## 2023-04-13 DIAGNOSIS — C01 Malignant neoplasm of base of tongue: Secondary | ICD-10-CM | POA: Insufficient documentation

## 2023-04-13 DIAGNOSIS — I959 Hypotension, unspecified: Secondary | ICD-10-CM | POA: Insufficient documentation

## 2023-04-13 DIAGNOSIS — I2510 Atherosclerotic heart disease of native coronary artery without angina pectoris: Secondary | ICD-10-CM | POA: Insufficient documentation

## 2023-04-13 DIAGNOSIS — Z006 Encounter for examination for normal comparison and control in clinical research program: Secondary | ICD-10-CM | POA: Insufficient documentation

## 2023-04-13 LAB — COMPREHENSIVE METABOLIC PANEL
ALT: 20 U/L (ref 0–44)
AST: 39 U/L (ref 15–41)
Albumin: 3.8 g/dL (ref 3.5–5.0)
Alkaline Phosphatase: 63 U/L (ref 38–126)
Anion gap: 7 (ref 5–15)
BUN: 22 mg/dL (ref 8–23)
CO2: 29 mmol/L (ref 22–32)
Calcium: 9.1 mg/dL (ref 8.9–10.3)
Chloride: 102 mmol/L (ref 98–111)
Creatinine, Ser: 0.99 mg/dL (ref 0.61–1.24)
GFR, Estimated: >60 mL/min (ref >60)
Glucose, Bld: 100 mg/dL — ABNORMAL HIGH (ref 70–99)
Potassium: 4.6 mmol/L (ref 3.5–5.1)
Sodium: 138 mmol/L (ref 135–145)
Total Bilirubin: 0.6 mg/dL (ref <1.2)
Total Protein: 6.4 g/dL — ABNORMAL LOW (ref 6.5–8.1)

## 2023-04-13 LAB — CBC
HCT: 41 % (ref 39.0–52.0)
Hemoglobin: 13.8 g/dL (ref 13.0–17.0)
MCH: 33.1 pg (ref 26.0–34.0)
MCHC: 33.7 g/dL (ref 30.0–36.0)
MCV: 98.3 fL (ref 80.0–100.0)
Platelets: 144 10*3/uL — ABNORMAL LOW (ref 150–400)
RBC: 4.17 MIL/uL — ABNORMAL LOW (ref 4.22–5.81)
RDW: 12.7 % (ref 11.5–15.5)
WBC: 5.3 10*3/uL (ref 4.0–10.5)
nRBC: 0 % (ref 0.0–0.2)

## 2023-04-13 LAB — TSH: TSH: 2.224 u[IU]/mL (ref 0.350–4.500)

## 2023-04-17 ENCOUNTER — Other Ambulatory Visit: Payer: Self-pay | Admitting: Physician Assistant

## 2023-04-19 MED ORDER — MIDODRINE HCL 10 MG PO TABS: 10.0000 mg | ORAL_TABLET | Freq: Three times a day (TID) | ORAL | 3 refills | Status: AC

## 2023-04-19 NOTE — Telephone Encounter (Signed)
Prescription for Midodrine sent to [facility] per patient's request. 90 day supply with 3 refills

## 2023-04-24 DIAGNOSIS — T66XXXS Radiation sickness, unspecified, sequela: Secondary | ICD-10-CM | POA: Diagnosis not present

## 2023-04-24 DIAGNOSIS — C109 Malignant neoplasm of oropharynx, unspecified: Secondary | ICD-10-CM | POA: Diagnosis not present

## 2023-04-24 DIAGNOSIS — C770 Secondary and unspecified malignant neoplasm of lymph nodes of head, face and neck: Secondary | ICD-10-CM | POA: Diagnosis not present

## 2023-04-24 DIAGNOSIS — L819 Disorder of pigmentation, unspecified: Secondary | ICD-10-CM | POA: Diagnosis not present

## 2023-04-24 DIAGNOSIS — I890 Lymphedema, not elsewhere classified: Secondary | ICD-10-CM | POA: Diagnosis not present

## 2023-04-24 LAB — GENECONNECT MOLECULAR SCREEN: Genetic Analysis Overall Interpretation: NEGATIVE

## 2023-04-25 ENCOUNTER — Ambulatory Visit (HOSPITAL_COMMUNITY): Payer: PPO | Attending: Physician Assistant

## 2023-04-25 DIAGNOSIS — I4729 Other ventricular tachycardia: Secondary | ICD-10-CM | POA: Insufficient documentation

## 2023-04-25 DIAGNOSIS — I7121 Aneurysm of the ascending aorta, without rupture: Secondary | ICD-10-CM | POA: Diagnosis not present

## 2023-04-25 DIAGNOSIS — I350 Nonrheumatic aortic (valve) stenosis: Secondary | ICD-10-CM | POA: Diagnosis not present

## 2023-04-25 DIAGNOSIS — E785 Hyperlipidemia, unspecified: Secondary | ICD-10-CM | POA: Insufficient documentation

## 2023-04-25 DIAGNOSIS — I959 Hypotension, unspecified: Secondary | ICD-10-CM | POA: Insufficient documentation

## 2023-04-25 DIAGNOSIS — I493 Ventricular premature depolarization: Secondary | ICD-10-CM | POA: Insufficient documentation

## 2023-04-25 DIAGNOSIS — Z950 Presence of cardiac pacemaker: Secondary | ICD-10-CM | POA: Insufficient documentation

## 2023-04-25 DIAGNOSIS — I2510 Atherosclerotic heart disease of native coronary artery without angina pectoris: Secondary | ICD-10-CM | POA: Diagnosis not present

## 2023-04-26 ENCOUNTER — Ambulatory Visit (HOSPITAL_COMMUNITY): Payer: PPO

## 2023-04-26 ENCOUNTER — Ambulatory Visit (HOSPITAL_COMMUNITY)
Admission: RE | Admit: 2023-04-26 | Discharge: 2023-04-26 | Disposition: A | Discharge status: Home / Self Care | Payer: PPO | Source: Ambulatory Visit | Attending: Radiation Oncology | Admitting: Radiation Oncology

## 2023-04-26 DIAGNOSIS — K863 Pseudocyst of pancreas: Secondary | ICD-10-CM | POA: Diagnosis not present

## 2023-04-26 DIAGNOSIS — C01 Malignant neoplasm of base of tongue: Secondary | ICD-10-CM | POA: Insufficient documentation

## 2023-04-26 DIAGNOSIS — I700 Atherosclerosis of aorta: Secondary | ICD-10-CM | POA: Insufficient documentation

## 2023-04-26 DIAGNOSIS — C760 Malignant neoplasm of head, face and neck: Secondary | ICD-10-CM | POA: Diagnosis not present

## 2023-04-26 DIAGNOSIS — R599 Enlarged lymph nodes, unspecified: Secondary | ICD-10-CM | POA: Diagnosis not present

## 2023-04-26 DIAGNOSIS — K862 Cyst of pancreas: Secondary | ICD-10-CM | POA: Diagnosis not present

## 2023-04-26 DIAGNOSIS — I2510 Atherosclerotic heart disease of native coronary artery without angina pectoris: Secondary | ICD-10-CM | POA: Insufficient documentation

## 2023-04-26 DIAGNOSIS — C109 Malignant neoplasm of oropharynx, unspecified: Secondary | ICD-10-CM | POA: Diagnosis not present

## 2023-04-26 LAB — ECHOCARDIOGRAM COMPLETE
AR max vel: 1.99 cm2
AV Area VTI: 1.85 cm2
AV Area mean vel: 1.82 cm2
AV Mean grad: 13.8 mm[Hg]
AV Peak grad: 23 mm[Hg]
Ao pk vel: 2.4 m/s
Area-P 1/2: 2.26 cm2
P 1/2 time: 450 ms
S' Lateral: 3.3 cm

## 2023-04-26 MED ORDER — IOHEXOL 300 MG/ML  SOLN
100.0000 mL | Freq: Once | INTRAMUSCULAR | Status: AC | PRN
  Administered 2023-04-26: 100 mL via INTRAVENOUS

## 2023-04-26 MED ORDER — SODIUM CHLORIDE (PF) 0.9 % IJ SOLN
INTRAMUSCULAR | Status: AC
  Filled 2023-04-26: qty 50

## 2023-05-04 ENCOUNTER — Ambulatory Visit (INDEPENDENT_AMBULATORY_CARE_PROVIDER_SITE_OTHER): Payer: PPO

## 2023-05-04 DIAGNOSIS — I442 Atrioventricular block, complete: Secondary | ICD-10-CM

## 2023-05-04 LAB — CUP PACEART REMOTE DEVICE CHECK
Battery Remaining Longevity: 120 mo
Battery Voltage: 3.01 V
Brady Statistic AP VP Percent: 21.51 %
Brady Statistic AP VS Percent: 13.79 %
Brady Statistic AS VP Percent: 19.91 %
Brady Statistic AS VS Percent: 44.79 %
Brady Statistic RA Percent Paced: 35.54 %
Brady Statistic RV Percent Paced: 41.42 %
Date Time Interrogation Session: 20241226050619
Implantable Lead Connection Status: 753985
Implantable Lead Connection Status: 753985
Implantable Lead Implant Date: 20200702
Implantable Lead Implant Date: 20200702
Implantable Lead Location: 753859
Implantable Lead Location: 753860
Implantable Lead Model: 5076
Implantable Lead Model: 5076
Implantable Pulse Generator Implant Date: 20200702
Lead Channel Impedance Value: 304 Ohm
Lead Channel Impedance Value: 323 Ohm
Lead Channel Impedance Value: 399 Ohm
Lead Channel Impedance Value: 399 Ohm
Lead Channel Pacing Threshold Amplitude: 0.5 V
Lead Channel Pacing Threshold Amplitude: 0.875 V
Lead Channel Pacing Threshold Pulse Width: 0.4 ms
Lead Channel Pacing Threshold Pulse Width: 0.4 ms
Lead Channel Sensing Intrinsic Amplitude: 2.5 mV
Lead Channel Sensing Intrinsic Amplitude: 2.5 mV
Lead Channel Sensing Intrinsic Amplitude: 6.5 mV
Lead Channel Sensing Intrinsic Amplitude: 6.5 mV
Lead Channel Setting Pacing Amplitude: 1.5 V
Lead Channel Setting Pacing Amplitude: 2 V
Lead Channel Setting Pacing Pulse Width: 0.4 ms
Lead Channel Setting Sensing Sensitivity: 2.8 mV
Zone Setting Status: 755011
Zone Setting Status: 755011

## 2023-05-04 NOTE — Telephone Encounter (Addendum)
Can you please contact [facility] to see if radiologist would be able to take a look and comment on size of ascending thoracic aorta on CT chest w contrast dated 04/26/23? Sometimes they are, but other times they are not timed with the intention of looking at the aorta. (I did not order separately as I did not want to interfere with the protocol that oncology is using for his cancer surveillance.) If they cannot assess the aneurysm size, please order MR Angio Chest Aorta for further surveillance of ascending aorta aneurysm. Switching over to MRA given the degree of CT scans he'll be having regularly for his other issues. Thank you!

## 2023-05-04 NOTE — Telephone Encounter (Signed)
Called and spoke to [facility] in the reading room at [facility] radiology [phone]). She states she will send a request for addendum to Dr Jayme Cloud who read the CT's and ask him to measure/comment on the ascending aorta.

## 2023-05-05 ENCOUNTER — Encounter: Payer: Self-pay | Admitting: Radiation Oncology

## 2023-05-05 ENCOUNTER — Ambulatory Visit
Admission: RE | Admit: 2023-05-05 | Discharge: 2023-05-05 | Disposition: A | Discharge status: Home / Self Care | Payer: PPO | Source: Ambulatory Visit | Attending: Radiation Oncology | Admitting: Radiation Oncology

## 2023-05-05 VITALS — BP 107/67 | HR 63 | Temp 97.9°F | Resp 18 | Ht 72.0 in | Wt 204.2 lb

## 2023-05-05 DIAGNOSIS — Z85810 Personal history of malignant neoplasm of tongue: Secondary | ICD-10-CM | POA: Insufficient documentation

## 2023-05-05 DIAGNOSIS — Z923 Personal history of irradiation: Secondary | ICD-10-CM | POA: Insufficient documentation

## 2023-05-05 DIAGNOSIS — Z79899 Other long term (current) drug therapy: Secondary | ICD-10-CM | POA: Diagnosis not present

## 2023-05-05 DIAGNOSIS — I700 Atherosclerosis of aorta: Secondary | ICD-10-CM | POA: Insufficient documentation

## 2023-05-05 DIAGNOSIS — I2510 Atherosclerotic heart disease of native coronary artery without angina pectoris: Secondary | ICD-10-CM | POA: Insufficient documentation

## 2023-05-05 DIAGNOSIS — C01 Malignant neoplasm of base of tongue: Secondary | ICD-10-CM | POA: Diagnosis not present

## 2023-05-05 DIAGNOSIS — Z7982 Long term (current) use of aspirin: Secondary | ICD-10-CM | POA: Diagnosis not present

## 2023-05-05 DIAGNOSIS — I083 Combined rheumatic disorders of mitral, aortic and tricuspid valves: Secondary | ICD-10-CM | POA: Diagnosis not present

## 2023-05-05 DIAGNOSIS — C109 Malignant neoplasm of oropharynx, unspecified: Secondary | ICD-10-CM | POA: Diagnosis not present

## 2023-05-05 DIAGNOSIS — I890 Lymphedema, not elsewhere classified: Secondary | ICD-10-CM | POA: Diagnosis not present

## 2023-05-05 NOTE — Progress Notes (Addendum)
Patient identity verified x2  Patient Clarence Andrade. Mcintyre is here for a follow-up appointment today- 05/05/2023 for:   Diagnosis: Squamous cell carcinoma of oropharynx (HCC) and Malignant neoplasm of base of tongue [facility])   Treatment Completion Date: First Treatment Date: 2022-03-09 - Last Treatment Date: 2022-04-25  Pain issues, if any: None Using a feeding tube?: None Weight changes, if any: Steady weight of 195 x3 months Swallowing issues, if any: Mild Dysphagia, occasional. Managing well. Smoking or chewing tobacco? None Using fluoride toothpaste daily? None Last ENT visit was on: 04/24/2023 Other notable issues, if any: None   Vitals: BP 107/67 (BP Location: Left Arm, Patient Position: Sitting, Cuff Size: Normal)   Pulse 63   Temp 97.9 F (36.6 C) (Oral)   Resp 18   Ht 6' (1.829 m)   Wt 204 lb 4 oz (92.6 kg)   SpO2 98%   BMI 27.70 kg/m   This concludes the interaction.  Ruel Favors, LPN

## 2023-05-05 NOTE — Telephone Encounter (Signed)
I will leave this message in my inbox as FYI to [place] when she helps cover next week and CC her as Lorain Childes so she knows the back story -   Clarence Andrade, this is a patient for which we monitor his ascending thoracic aortic aneurysm. He has preferred 18 month follow-up, also actively under surveillance for squamous cell carcinoma of oropharynx. He was having surveillance CT scanning 04/26/23 to f/u his cancer. I had told him sometimes the radiologist will comment on the aortic dimension on the study if they're able to see it. I did not explicitly order a separate gated study as I did not want to impact the imaging focus for his cancer surveillance. He reached out per mychart msg below to inquire if they were able to see it. I asked our office to contact [facility] to see if they could make an addendum as to whether or not they could eval the aortic dimensions; Eugene Gavia spoke with their team and they put in a note for the original reading radiologist to review. I also sent an update to [name] to make him aware this is pending.  I would check back on Monday to see if they added commentary. If not, recommend having office reach back out to [facility] to clarify whether they can or cannot eval aortic size on the CT dated 12/18 (as per messages below). If they cannot, would pursue MR angio of the chest to follow this going forward to hopefully decrease the amount of CTs he is going to be having going forward given his onc history. Dr. Elberta Fortis confirmed PPM is MRI compatible. Thank you!

## 2023-05-05 NOTE — Progress Notes (Signed)
Radiation Oncology         [phone] ________________________________  Name: Clarence Andrade MRN: [id number]  Date: 05/05/2023  DOB: [birth year]  Follow-Up Visit Note  CC: Emilio Aspen, MD  Scarlette Ar, MD  Diagnosis and Prior Radiotherapy:       ICD-10-CM   1. Malignant neoplasm of base of tongue (HCC)  C01     2. Squamous cell carcinoma of oropharynx (HCC)  C10.9       Cancer Staging  Malignant neoplasm of base of tongue (HCC) Staging form: Pharynx - HPV-Mediated Oropharynx, AJCC 8th Edition - Clinical stage from 02/18/2022: Stage II (cT3, cN1, cM0, p16+) - Unsigned Stage prefix: Initial diagnosis  Squamous cell carcinoma of oropharynx (HCC) Staging form: Pharynx - HPV-Mediated Oropharynx, AJCC 8th Edition - Clinical: Stage I (cT2, cN1, cM0, p16+) - Signed by Rachel Moulds, MD on 02/17/2022 Stage prefix: Initial diagnosis ==========DELIVERED PLANS==========  First Treatment Date: 2022-03-09 - Last Treatment Date: 2022-04-25   Plan Name: HN_BOT Site: Oropharynx Technique: IMRT Mode: Photon Dose Per Fraction: 2 Gy Prescribed Dose (Delivered / Prescribed): 66 Gy / 70 Gy Prescribed Fxs (Delivered / Prescribed): 33 / 35 (patient ended treatment prematurely)  CHIEF COMPLAINT:  Here for follow-up and surveillance of throat cancer  Narrative:  Patient Clarence Andrade is here for a follow-up appointment today- 05/05/2023 for:   Diagnosis: Squamous cell carcinoma of oropharynx (HCC) and Malignant neoplasm of base of tongue [facility])   Treatment Completion Date: First Treatment Date: 2022-03-09 - Last Treatment Date: 2022-04-25  Pain issues, if any: None Using a feeding tube?: None Weight changes, if any: Steady weight  Wt Readings from Last 3 Encounters:  05/05/23 204 lb 4 oz (92.6 kg)  03/16/23 200 lb 6.4 oz (90.9 kg)  01/31/23 209 lb (94.8 kg)   Swallowing issues, if any: Mild Dysphagia, occasional. Managing well. Smoking or chewing tobacco? None Using  fluoride toothpaste daily? None Last ENT visit was on: 04/24/2023 Other notable issues, if any: self-massage at times for neck lymphedema, no longer sees PT  Vitals: BP 107/67 (BP Location: Left Arm, Patient Position: Sitting, Cuff Size: Normal)   Pulse 63   Temp 97.9 F (36.6 C) (Oral)   Resp 18   Ht 6' (1.829 m)   Wt 204 lb 4 oz (92.6 kg)   SpO2 98%   BMI 27.70 kg/m     ALLERGIES:  is allergic to sertraline hcl and transderm-scop [scopolamine].  Meds: Current Outpatient Medications  Medication Sig Dispense Refill   acetaminophen (TYLENOL) 500 MG tablet Take 1,000 mg by mouth every 6 (six) hours as needed (for pain).     aspirin EC 81 MG tablet Take 81 mg by mouth daily.     buPROPion (WELLBUTRIN XL) 150 MG 24 hr tablet Take 150 mg by mouth daily. 150 mg, per tube, every morning     COVID-19 mRNA vaccine 2024-2025 (COMIRNATY) syringe Inject 0.3 mLs into the muscle. 0.3 mL 0   feeding supplement, ENSURE COMPLETE, (ENSURE COMPLETE) LIQD Take 237 mLs by mouth See admin instructions. Mix 237 ml's of Ensure Complete with 237 ml's of milk = 3 feedings a day     midodrine (PROAMATINE) 10 MG tablet Take 1 tablet (10 mg total) by mouth 3 (three) times daily with meals. 270 tablet 3   nitroGLYCERIN (NITROSTAT) 0.4 MG SL tablet Place 1 tablet (0.4 mg total) under the tongue every 5 (five) minutes as needed for chest pain. 25 tablet 3   rosuvastatin (  CRESTOR) 10 MG tablet TAKE 1 TABLET ONCE DAILY. 90 tablet 0   tamsulosin (FLOMAX) 0.4 MG CAPS capsule Take 0.4 mg by mouth daily.     traZODone (DESYREL) 50 MG tablet Take 50 mg by mouth at bedtime. Patient only taking 25mg      No current facility-administered medications for this encounter.    Physical Findings: The patient is in no acute distress. Patient is alert and oriented. Wt Readings from Last 3 Encounters:  05/05/23 204 lb 4 oz (92.6 kg)  03/16/23 200 lb 6.4 oz (90.9 kg)  01/31/23 209 lb (94.8 kg)    height is 6' (1.829 m) and  weight is 204 lb 4 oz (92.6 kg). His oral temperature is 97.9 F (36.6 C). His blood pressure is 107/67 and his pulse is 63. His respiration is 18 and oxygen saturation is 98%. .  General: Alert and oriented, in no acute distress HEENT: Head is normocephalic. Extraocular movements are intact. Oropharynx is notable for no thrush or visible tumor  Neck: Neck is notable for  no masses. Well healing skin in the radiation field.  Moderate anterior neck lymphedema present.   Skin: well healed skin in treatment fields Heart: Regular in rate and rhythm   Chest: Clear to auscultation bilaterally, with no rhonchi, wheezes, or rales. Lymphatics: see Neck Exam Psychiatric: Judgment and insight are intact. Affect is appropriate.   Lab Findings: Lab Results  Component Value Date   WBC 5.3 04/13/2023   HGB 13.8 04/13/2023   HCT 41.0 04/13/2023   MCV 98.3 04/13/2023   PLT 144 (L) 04/13/2023    Lab Results  Component Value Date   TSH 2.224 04/13/2023    Radiographic Findings: CUP PACEART REMOTE DEVICE CHECK Result Date: 05/04/2023 Scheduled remote reviewed. Normal device function.  1 SVT-ST classified episode, 14 sec, 1:1 atrial driven conduction, 154 bpm. Next remote 91 days. MC, CVRS  CT Soft Tissue Neck W Contrast Result Date: 05/04/2023 CLINICAL DATA:  Provided history: Head/neck cancer, monitor. Malignant neoplasm of base of tongue. EXAM: CT NECK WITH CONTRAST TECHNIQUE: Multidetector CT imaging of the neck was performed using the standard protocol following the bolus administration of intravenous contrast. RADIATION DOSE REDUCTION: This exam was performed according to the departmental dose-optimization program which includes automated exposure control, adjustment of the mA and/or kV according to patient size and/or use of iterative reconstruction technique. CONTRAST:  OMNIPAQUE IOHEXOL 300 MG/ML  SOLN COMPARISON:  Head CT 07/25/2022.  Neck CT 02/02/2022. FINDINGS: Pharynx and larynx: No  appreciable residual left tongue base/oropharyngeal tumor. No swelling or mass identified elsewhere within the oral cavity, pharynx or larynx. Salivary glands: No inflammation, mass, or stone. Thyroid: Unremarkable. Lymph nodes: Subtle asymmetric ill-defined soft tissue at the left level 2 station (at site of previously demonstrated lymphadenopathy) (for instance as seen on series 2, image 68). No cervical lymphadenopathy identified elsewhere. Vascular: The major vascular structures of the neck are patent. Atherosclerotic plaque within the visualized thoracic aorta, proximal major branch vessels of the neck, and within the carotid arteries. Limited intracranial: No evidence of an acute intracranial abnormality within the field of view. Visualized orbits: No orbital mass or acute orbital finding. Mastoids and visualized paranasal sinuses: Trace mucosal thickening within the right maxillary sinus. Opacification of a single right ethmoid air cell. No significant mastoid effusion. Skeleton: Spondylosis at the cervical and visualized upper thoracic levels. Multilevel ventral osteophytes within the cervical spine, bulky at C3-C4 and C4-C5. Upper chest: Separately reported on same day chest  CT. Other: Mild stranding within the submandibular region and upper neck, likely post-treatment changes. IMPRESSION: 1. No appreciable residual left tongue base/oropharyngeal tumor. 2. Only subtle nonspecific ill-defined soft tissue persists at site of the previously demonstrated left level 2 lymphadenopathy. Attention recommended on imaging follow-up. Electronically Signed   By: Jackey Loge D.O.   On: 05/04/2023 09:55   CT Chest W Contrast Result Date: 05/02/2023 CLINICAL DATA:  Head and neck cancer, oropharyngeal malignancy * Tracking Code: BO * EXAM: CT CHEST AND ABDOMEN WITH CONTRAST TECHNIQUE: Multidetector CT imaging of the chest and abdomen was performed following the standard protocol during bolus administration of  intravenous contrast. RADIATION DOSE REDUCTION: This exam was performed according to the departmental dose-optimization program which includes automated exposure control, adjustment of the mA and/or kV according to patient size and/or use of iterative reconstruction technique. CONTRAST:  OMNIPAQUE IOHEXOL 300 MG/ML  SOLN COMPARISON:  PET-CT, 07/25/2022, MR abdomen, 09/03/2019 FINDINGS: CT CHEST FINDINGS Cardiovascular: Left chest multi lead pacer. Aortic atherosclerosis. Aortic valve calcifications. Normal heart size. Three-vessel coronary artery calcifications no pericardial effusion. Mediastinum/Nodes: No enlarged mediastinal, hilar, or axillary lymph nodes. Thyroid gland, trachea, and esophagus demonstrate no significant findings. Lungs/Pleura: Minimal biapical radiation fibrosis. No pleural effusion or pneumothorax. Musculoskeletal: No chest wall abnormality. No acute osseous findings. CT ABDOMEN FINDINGS Hepatobiliary: No focal liver abnormality is seen. Status post cholecystectomy. No biliary dilatation. Pancreas: Multiple subcentimeter fluid attenuation cystic lesions scattered throughout the pancreas, largest in the tip of the pancreatic tail measuring up to 0.8 cm (series 5, image 67). No pancreatic ductal dilatation or surrounding inflammatory changes. Spleen: Normal in size without significant abnormality. Adrenals/Urinary Tract: Benign left adrenal adenoma, for which no further follow-up or characterization required. Kidneys are normal, without renal calculi, solid lesion, or hydronephrosis. Stomach/Bowel: Stomach is within normal limits. Appendix appears normal. No evidence of bowel wall thickening, distention, or inflammatory changes. Vascular/Lymphatic: Aortic atherosclerosis. No enlarged abdominal lymph nodes. Other: No abdominal wall hernia or abnormality. No ascites. Musculoskeletal: No acute osseous findings. IMPRESSION: 1. No evidence of lymphadenopathy or metastatic disease in the chest or  abdomen. 2. Multiple subcentimeter fluid attenuation cystic lesions scattered throughout the pancreas, largest in the tip of the pancreatic tail measuring up to 0.8 cm. 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 Given small size, imaging stability, and in the setting of other known primary malignancy, no specific further follow-up or characterization is required. 3. Coronary artery disease. 4. Aortic valve calcifications. Correlate for echocardiographic evidence of aortic valve dysfunction. Aortic Atherosclerosis (ICD10-I70.0). Electronically Signed   By: Jearld Lesch M.D.   On: 05/02/2023 20:38   CT ABDOMEN W CONTRAST Result Date: 05/02/2023 CLINICAL DATA:  Head and neck cancer, oropharyngeal malignancy * Tracking Code: BO * EXAM: CT CHEST AND ABDOMEN WITH CONTRAST TECHNIQUE: Multidetector CT imaging of the chest and abdomen was performed following the standard protocol during bolus administration of intravenous contrast. RADIATION DOSE REDUCTION: This exam was performed according to the departmental dose-optimization program which includes automated exposure control, adjustment of the mA and/or kV according to patient size and/or use of iterative reconstruction technique. CONTRAST:  OMNIPAQUE IOHEXOL 300 MG/ML  SOLN COMPARISON:  PET-CT, 07/25/2022, MR abdomen, 09/03/2019 FINDINGS: CT CHEST FINDINGS Cardiovascular: Left chest multi lead pacer. Aortic atherosclerosis. Aortic valve calcifications. Normal heart size. Three-vessel coronary artery calcifications no pericardial effusion. Mediastinum/Nodes: No enlarged mediastinal, hilar, or axillary lymph nodes. Thyroid gland, trachea, and esophagus demonstrate no significant findings. Lungs/Pleura:  Minimal biapical radiation fibrosis. No pleural effusion or pneumothorax. Musculoskeletal: No chest wall abnormality. No acute osseous findings. CT ABDOMEN FINDINGS Hepatobiliary: No focal liver abnormality is seen.  Status post cholecystectomy. No biliary dilatation. Pancreas: Multiple subcentimeter fluid attenuation cystic lesions scattered throughout the pancreas, largest in the tip of the pancreatic tail measuring up to 0.8 cm (series 5, image 67). No pancreatic ductal dilatation or surrounding inflammatory changes. Spleen: Normal in size without significant abnormality. Adrenals/Urinary Tract: Benign left adrenal adenoma, for which no further follow-up or characterization required. Kidneys are normal, without renal calculi, solid lesion, or hydronephrosis. Stomach/Bowel: Stomach is within normal limits. Appendix appears normal. No evidence of bowel wall thickening, distention, or inflammatory changes. Vascular/Lymphatic: Aortic atherosclerosis. No enlarged abdominal lymph nodes. Other: No abdominal wall hernia or abnormality. No ascites. Musculoskeletal: No acute osseous findings. IMPRESSION: 1. No evidence of lymphadenopathy or metastatic disease in the chest or abdomen. 2. Multiple subcentimeter fluid attenuation cystic lesions scattered throughout the pancreas, largest in the tip of the pancreatic tail measuring up to 0.8 cm. 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 Given small size, imaging stability, and in the setting of other known primary malignancy, no specific further follow-up or characterization is required. 3. Coronary artery disease. 4. Aortic valve calcifications. Correlate for echocardiographic evidence of aortic valve dysfunction. Aortic Atherosclerosis (ICD10-I70.0). Electronically Signed   By: Jearld Lesch M.D.   On: 05/02/2023 20:38   ECHOCARDIOGRAM COMPLETE Result Date: 04/26/2023    ECHOCARDIOGRAM REPORT   Patient Name:   THUNDER RIGONI Date of Exam: 04/25/2023 Medical Rec #:  [id number]       Height:       72.0 in Accession #:    [id number]      Weight:       200.4 lb Date of Birth:  [birth year]       BSA:          2.132 m Patient Age:    77 years         BP:           98/72 mmHg Patient Gender: M               HR:           58 bpm. Exam Location:  [address] Procedure: 2D Echo, Cardiac Doppler and Color Doppler Indications:    I25.10 Coronary artery disease.  History:        Patient has prior history of Echocardiogram examinations, most                 recent 05/04/2022. CAD and Previous Myocardial Infarction,                 Pacemaker, Arrythmias:PVC, Signs/Symptoms:Murmur; Risk                 Factors:Dyslipidemia. Pulmonary hypertension. Obstructive sleep                 apnea.  Sonographer:    Sedonia Small Rodgers-Jones RDCS Referring Phys: [id number] DAYNA N DUNN IMPRESSIONS  1. Mild aortic stenosis.  2. Left ventricular ejection fraction, by estimation, is 60 to 65%. The left ventricle has normal function. The left ventricle has no regional wall motion abnormalities. Left ventricular diastolic parameters are consistent with Grade I diastolic dysfunction (impaired relaxation).  3. Right ventricular systolic function is normal. The right ventricular size is mildly enlarged.  4. The mitral valve is normal in structure. Mild mitral  valve regurgitation. No evidence of mitral stenosis.  5. The aortic valve is calcified. There is moderate calcification of the aortic valve. There is moderate thickening of the aortic valve. Aortic valve regurgitation is mild. Mild aortic valve stenosis. Aortic valve area, by VTI measures 1.85 cm. Aortic valve mean gradient measures 13.8 mmHg. Aortic valve Vmax measures 2.40 m/s.  6. The inferior vena cava is normal in size with greater than 50% respiratory variability, suggesting right atrial pressure of 3 mmHg. Comparison(s): Prior images reviewed side by side. Prior AV mean gradient is 12 mmHg. FINDINGS  Left Ventricle: Left ventricular ejection fraction, by estimation, is 60 to 65%. The left ventricle has normal function. The left ventricle has no regional wall motion abnormalities. The left ventricular internal cavity size was  normal in size. There is  no left ventricular hypertrophy. Left ventricular diastolic parameters are consistent with Grade I diastolic dysfunction (impaired relaxation). Right Ventricle: The right ventricular size is mildly enlarged. No increase in right ventricular wall thickness. Right ventricular systolic function is normal. Left Atrium: Left atrial size was normal in size. Right Atrium: Right atrial size was normal in size. Pericardium: There is no evidence of pericardial effusion. Mitral Valve: The mitral valve is normal in structure. Mild mitral valve regurgitation. No evidence of mitral valve stenosis. Tricuspid Valve: The tricuspid valve is normal in structure. Tricuspid valve regurgitation is mild . No evidence of tricuspid stenosis. The aortic valve is calcified. There is moderate calcification of the aortic valve. There is moderate thickening of the aortic valve. Aortic valve regurgitation is mild. Mild aortic stenosis is present. Pulmonic Valve: The pulmonic valve was normal in structure. Pulmonic valve regurgitation is not visualized. No evidence of pulmonic stenosis. Aorta: The aortic root is normal in size and structure. Venous: The inferior vena cava is normal in size with greater than 50% respiratory variability, suggesting right atrial pressure of 3 mmHg. IAS/Shunts: No atrial level shunt detected by color flow Doppler. Additional Comments: Mild aortic stenosis. A device lead is visualized in the right ventricle.  LEFT VENTRICLE PLAX 2D LVIDd:         5.20 cm   Diastology LVIDs:         3.30 cm   LV e' medial:    7.88 cm/s LV PW:         0.90 cm   LV E/e' medial:  6.9 LV IVS:        0.90 cm   LV e' lateral:   9.83 cm/s LVOT diam:     2.40 cm   LV E/e' lateral: 5.5 LV SV:         105 LV SV Index:   49 LVOT Area:     4.52 cm  RIGHT VENTRICLE RV Basal diam:  4.50 cm RV S prime:     10.45 cm/s TAPSE (M-mode): 2.0 cm LEFT ATRIUM             Index        RIGHT ATRIUM           Index LA diam:        4.60  cm 2.16 cm/m   RA Area:     16.40 cm LA Vol (A2C):   56.5 ml 26.50 ml/m  RA Volume:   46.10 ml  21.62 ml/m LA Vol (A4C):   41.1 ml 19.27 ml/m LA Biplane Vol: 49.2 ml 23.07 ml/m  AORTIC VALVE AV Area (Vmax):    1.99 cm AV Area (Vmean):  1.82 cm AV Area (VTI):     1.85 cm AV Vmax:           239.80 cm/s AV Vmean:          172.200 cm/s AV VTI:            0.565 m AV Peak Grad:      23.0 mmHg AV Mean Grad:      13.8 mmHg LVOT Vmax:         105.50 cm/s LVOT Vmean:        69.350 cm/s LVOT VTI:          0.231 m LVOT/AV VTI ratio: 0.41 AI PHT:            450 msec  AORTA Ao Root diam: 4.00 cm MITRAL VALVE                TRICUSPID VALVE MV Area (PHT): 2.26 cm     TR Peak grad:   20.6 mmHg MV Decel Time: 335 msec     TR Vmax:        227.00 cm/s MV E velocity: 54.50 cm/s MV A velocity: 115.00 cm/s  SHUNTS MV E/A ratio:  0.47         Systemic VTI:  0.23 m                             Systemic Diam: 2.40 cm Donato Schultz MD Electronically signed by Donato Schultz MD Signature Date/Time: 04/26/2023/6:06:17 AM    Final     Impression/Plan:    1) Head and Neck Cancer Status:  Exam and CT of the neck/chest with contrast show NED, doing well  2) Nutritional Status:   no complaints Wt Readings from Last 3 Encounters:  05/05/23 204 lb 4 oz (92.6 kg)  03/16/23 200 lb 6.4 oz (90.9 kg)  01/31/23 209 lb (94.8 kg)   3) Risk Factors: not smoking  4) Swallowing: functional, continue SLP.   5) Mild lymphedema present on PE today, patient declining PT referral.  Will continue massage at home  6) Dental: Encouraged to continue regular followup with dentistry   7) Thyroid function:  following labwork w PCP, recommend continuing this annually Lab Results  Component Value Date   TSH 2.224 04/13/2023    8) He will return to rad onc in 1 yr with surveillance CT neck and chest w/ contrast - if still NED, can continue with imaging only as clinically indicated. He will see Joyice Faster, PA, for exam and review of  scans.  Continue f/u w ENT and med onc.  On date of service, in total, I spent 25 minutes on this encounter. Patient was seen in person. _____________________________________   Lonie Peak, MD

## 2023-05-05 NOTE — Telephone Encounter (Signed)
As of 12/27 this AM, no radiology addendum added yet. Will continue to await if remark added.  If unable to calculate dimension of aorta on this non gated study, anticipate MRA of the chest to follow aneurysm but will route to Dr. Elberta Fortis to ensure pacemaker is MRI compatible. If not, will plan to f/u CT Angio Aorta for surveillance.

## 2023-05-14 NOTE — Telephone Encounter (Signed)
 Agree with notation below. Pt cx 05/2023 f/u and plans to call back to r/s per appt notes. Can discuss further at that time.

## 2023-05-30 ENCOUNTER — Ambulatory Visit: Payer: PPO | Admitting: Physician Assistant

## 2023-07-03 NOTE — Progress Notes (Unsigned)
 Electrophysiology Office Note:   Date:  07/04/2023  ID:  Clarence Andrade, DOB [birth year], MRN [id number]  Primary Cardiologist: Armanda Magic, MD Primary Heart Failure: None Electrophysiologist: Will Jorja Loa, MD       History of Present Illness:   Clarence Andrade is a 78 y.o. 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   Since last being seen in our clinic the patient reports he has lost a lot of weight with the XRT.  Notes food tastes differently than before.  He reports he gets dizzy every time he stands from a seated position - has not passed out or fallen.  Is on max dose midodrine. Knows to be cautious with standing / position changes.  No device related concerns.   He denies chest pain, palpitations, dyspnea, PND, orthopnea, nausea, vomiting, syncope, edema, weight gain, or early satiety.   Review of systems complete and found to be negative unless listed in HPI.   EP Information / Studies Reviewed:    EKG is not ordered today. EKG from 03/16/2023 reviewed which showed SR with 1st degree AVB, 67 bpm      PPM Interrogation-  reviewed in detail today,  See PACEART report.  Device History: Medtronic Dual Chamber PPM implanted 11/26/2018 for intermittent CHB  Studies:  ECHO 10/20219 > LVEF 55-60%, G1DD, AV moderately calcified, mild stenosis, trivial regurgitation LHC 03/2018 >  Severe single-vessel coronary artery disease, with chronic total occlusion of the proximal to mid LAD with bridging, left-to-left, and right-to-left collaterals.Moderate, non-obstructive disease involving the proximal LAD, proximal LCx, and distal RCA.  More severe disease noted involving small branch vessels (RV marginal and superior branch of OM3). Inability to cross the aortic valve for left heart catheterization due to insufficient catheter length complicated  by tortuous subclavian artery and dilated ascending aorta.  LVEF noted to be normal on recent echocardiogram. ECHO 04/2023 > LVEF 60-65%, no RWMA, G1DD, AV: moderate calcification of the AV, moderate thickening, mild regurgitation.   Arrhythmia / AAD CHB      Risk Assessment/Calculations:              Physical Exam:   VS:  BP (!) 93/57   Pulse 74   Ht 6' (1.829 m)   Wt 205 lb (93 kg)   SpO2 95%   BMI 27.80 kg/m    Wt Readings from Last 3 Encounters:  07/04/23 205 lb (93 kg)  05/05/23 204 lb 4 oz (92.6 kg)  03/16/23 200 lb 6.4 oz (90.9 kg)     GEN: Well nourished, well developed in no acute distress NECK: No JVD; No carotid bruits CARDIAC: Regular rate and rhythm, no murmurs, rubs, gallops RESPIRATORY:  Clear to auscultation without rales, wheezing or rhonchi  ABDOMEN: Soft, non-tender, non-distended EXTREMITIES:  No edema; No deformity   ASSESSMENT AND PLAN:    Intermittent CHB s/p Medtronic PPM  -Normal PPM function -See Pace Art report -No changes today  PVC's, NSVT  -monitor  CAD  VHD: Mild AS Hx of CTO of LAD  -no anginal symptoms  -euvolemic on exam   Chronic Hypotension  -on midodrine 10 mg TID  -discussed hydration, position changes slowly  HLD  -per primary Cardiology   Squamous Cell of the Tongue  -per ONC    Disposition:   Follow up with Dr. Elberta Fortis or B.Veleta Miners, NP  in 12 months  Signed, Canary Brim, NP-C, AGACNP-BC [facility] - Electrophysiology  07/04/2023, 12:48 PM

## 2023-07-04 ENCOUNTER — Ambulatory Visit: Payer: PPO | Attending: Cardiology | Admitting: Pulmonary Disease

## 2023-07-04 ENCOUNTER — Encounter: Payer: Self-pay | Admitting: Pulmonary Disease

## 2023-07-04 VITALS — BP 93/57 | HR 74 | Ht 72.0 in | Wt 205.0 lb

## 2023-07-04 DIAGNOSIS — Z950 Presence of cardiac pacemaker: Secondary | ICD-10-CM

## 2023-07-04 DIAGNOSIS — E785 Hyperlipidemia, unspecified: Secondary | ICD-10-CM | POA: Diagnosis not present

## 2023-07-04 DIAGNOSIS — I442 Atrioventricular block, complete: Secondary | ICD-10-CM

## 2023-07-04 LAB — CUP PACEART INCLINIC DEVICE CHECK
Battery Remaining Longevity: 119 mo
Battery Voltage: 3.01 V
Brady Statistic AP VP Percent: 13.48 %
Brady Statistic AP VS Percent: 12.37 %
Brady Statistic AS VP Percent: 16.11 %
Brady Statistic AS VS Percent: 58.04 %
Brady Statistic RA Percent Paced: 26.24 %
Brady Statistic RV Percent Paced: 29.59 %
Date Time Interrogation Session: 20250225123110
Implantable Lead Connection Status: 753985
Implantable Lead Connection Status: 753985
Implantable Lead Implant Date: 20200702
Implantable Lead Implant Date: 20200702
Implantable Lead Location: 753859
Implantable Lead Location: 753860
Implantable Lead Model: 5076
Implantable Lead Model: 5076
Implantable Pulse Generator Implant Date: 20200702
Lead Channel Impedance Value: 361 Ohm
Lead Channel Impedance Value: 418 Ohm
Lead Channel Impedance Value: 437 Ohm
Lead Channel Impedance Value: 532 Ohm
Lead Channel Pacing Threshold Amplitude: 0.5 V
Lead Channel Pacing Threshold Amplitude: 0.875 V
Lead Channel Pacing Threshold Pulse Width: 0.4 ms
Lead Channel Pacing Threshold Pulse Width: 0.4 ms
Lead Channel Sensing Intrinsic Amplitude: 1.625 mV
Lead Channel Sensing Intrinsic Amplitude: 15.375 mV
Lead Channel Sensing Intrinsic Amplitude: 3 mV
Lead Channel Sensing Intrinsic Amplitude: 8.875 mV
Lead Channel Setting Pacing Amplitude: 1.5 V
Lead Channel Setting Pacing Amplitude: 2 V
Lead Channel Setting Pacing Pulse Width: 0.4 ms
Lead Channel Setting Sensing Sensitivity: 2.8 mV
Zone Setting Status: 755011
Zone Setting Status: 755011

## 2023-07-04 NOTE — Patient Instructions (Signed)
Medication Instructions:  Your physician recommends that you continue on your current medications as directed. Please refer to the Current Medication list given to you today.  *If you need a refill on your cardiac medications before your next appointment, please call your pharmacy*  Lab Work: None ordered If you have labs (blood work) drawn today and your tests are completely normal, you will receive your results only by: MyChart Message (if you have MyChart) OR A paper copy in the mail If you have any lab test that is abnormal or we need to change your treatment, we will call you to review the results.  Follow-Up: At [facility] you and your health needs are our priority.  As part of our continuing mission to provide you with exceptional heart care, we have created designated Provider Care Teams.  These Care Teams include your primary Cardiologist (physician) and Advanced Practice Providers (APPs -  Physician Assistants and Nurse Practitioners) who all work together to provide you with the care you need, when you need it.  Your next appointment:   1 year(s)  Provider:   Loman Brooklyn, MD or Canary Brim, NP

## 2023-08-07 ENCOUNTER — Ambulatory Visit: Payer: PPO

## 2023-08-07 DIAGNOSIS — I442 Atrioventricular block, complete: Secondary | ICD-10-CM

## 2023-08-07 LAB — CUP PACEART REMOTE DEVICE CHECK
Battery Remaining Longevity: 117 mo
Battery Voltage: 3 V
Brady Statistic AP VP Percent: 15.29 %
Brady Statistic AP VS Percent: 12.76 %
Brady Statistic AS VP Percent: 21.53 %
Brady Statistic AS VS Percent: 50.41 %
Brady Statistic RA Percent Paced: 28.38 %
Brady Statistic RV Percent Paced: 36.83 %
Date Time Interrogation Session: 20250330223244
Implantable Lead Connection Status: 753985
Implantable Lead Connection Status: 753985
Implantable Lead Implant Date: 20200702
Implantable Lead Implant Date: 20200702
Implantable Lead Location: 753859
Implantable Lead Location: 753860
Implantable Lead Model: 5076
Implantable Lead Model: 5076
Implantable Pulse Generator Implant Date: 20200702
Lead Channel Impedance Value: 304 Ohm
Lead Channel Impedance Value: 323 Ohm
Lead Channel Impedance Value: 399 Ohm
Lead Channel Impedance Value: 418 Ohm
Lead Channel Pacing Threshold Amplitude: 0.5 V
Lead Channel Pacing Threshold Amplitude: 0.75 V
Lead Channel Pacing Threshold Pulse Width: 0.4 ms
Lead Channel Pacing Threshold Pulse Width: 0.4 ms
Lead Channel Sensing Intrinsic Amplitude: 2.5 mV
Lead Channel Sensing Intrinsic Amplitude: 2.5 mV
Lead Channel Sensing Intrinsic Amplitude: 7.5 mV
Lead Channel Sensing Intrinsic Amplitude: 7.5 mV
Lead Channel Setting Pacing Amplitude: 1.5 V
Lead Channel Setting Pacing Amplitude: 2 V
Lead Channel Setting Pacing Pulse Width: 0.4 ms
Lead Channel Setting Sensing Sensitivity: 2.8 mV
Zone Setting Status: 755011
Zone Setting Status: 755011

## 2023-09-21 NOTE — Progress Notes (Signed)
 Remote pacemaker transmission.

## 2023-11-06 ENCOUNTER — Ambulatory Visit: Payer: PPO

## 2023-11-06 DIAGNOSIS — I442 Atrioventricular block, complete: Secondary | ICD-10-CM | POA: Diagnosis not present

## 2023-11-07 LAB — CUP PACEART REMOTE DEVICE CHECK
Battery Remaining Longevity: 113 mo
Battery Voltage: 3 V
Brady Statistic AP VP Percent: 24.52 %
Brady Statistic AP VS Percent: 11.15 %
Brady Statistic AS VP Percent: 24.68 %
Brady Statistic AS VS Percent: 39.66 %
Brady Statistic RA Percent Paced: 36.12 %
Brady Statistic RV Percent Paced: 49.19 %
Date Time Interrogation Session: 20250629222041
Implantable Lead Connection Status: 753985
Implantable Lead Connection Status: 753985
Implantable Lead Implant Date: 20200702
Implantable Lead Implant Date: 20200702
Implantable Lead Location: 753859
Implantable Lead Location: 753860
Implantable Lead Model: 5076
Implantable Lead Model: 5076
Implantable Pulse Generator Implant Date: 20200702
Lead Channel Impedance Value: 285 Ohm
Lead Channel Impedance Value: 304 Ohm
Lead Channel Impedance Value: 380 Ohm
Lead Channel Impedance Value: 399 Ohm
Lead Channel Pacing Threshold Amplitude: 0.5 V
Lead Channel Pacing Threshold Amplitude: 1 V
Lead Channel Pacing Threshold Pulse Width: 0.4 ms
Lead Channel Pacing Threshold Pulse Width: 0.4 ms
Lead Channel Sensing Intrinsic Amplitude: 2.5 mV
Lead Channel Sensing Intrinsic Amplitude: 2.5 mV
Lead Channel Sensing Intrinsic Amplitude: 6 mV
Lead Channel Sensing Intrinsic Amplitude: 6 mV
Lead Channel Setting Pacing Amplitude: 1.5 V
Lead Channel Setting Pacing Amplitude: 2 V
Lead Channel Setting Pacing Pulse Width: 0.4 ms
Lead Channel Setting Sensing Sensitivity: 2.8 mV
Zone Setting Status: 755011
Zone Setting Status: 755011

## 2023-11-13 ENCOUNTER — Ambulatory Visit: Payer: Self-pay | Admitting: Cardiology

## 2024-01-19 ENCOUNTER — Other Ambulatory Visit (HOSPITAL_BASED_OUTPATIENT_CLINIC_OR_DEPARTMENT_OTHER): Payer: Self-pay

## 2024-01-19 MED ORDER — FLUZONE HIGH-DOSE 0.5 ML IM SUSY
0.5000 mL | PREFILLED_SYRINGE | Freq: Once | INTRAMUSCULAR | 0 refills | Status: AC
  Filled 2024-01-19: qty 0.5, 1d supply, fill #0

## 2024-01-26 ENCOUNTER — Other Ambulatory Visit (HOSPITAL_BASED_OUTPATIENT_CLINIC_OR_DEPARTMENT_OTHER): Payer: Self-pay

## 2024-01-26 MED ORDER — COMIRNATY 30 MCG/0.3ML IM SUSY
0.3000 mL | PREFILLED_SYRINGE | Freq: Once | INTRAMUSCULAR | 0 refills | Status: AC
  Filled 2024-01-26: qty 0.3, 1d supply, fill #0

## 2024-02-01 ENCOUNTER — Inpatient Hospital Stay: Payer: PPO | Attending: Hematology and Oncology | Admitting: Hematology and Oncology

## 2024-02-01 VITALS — BP 126/67 | HR 78 | Temp 98.4°F | Resp 18 | Wt 202.6 lb

## 2024-02-01 DIAGNOSIS — Z79899 Other long term (current) drug therapy: Secondary | ICD-10-CM | POA: Diagnosis not present

## 2024-02-01 DIAGNOSIS — C01 Malignant neoplasm of base of tongue: Secondary | ICD-10-CM

## 2024-02-01 DIAGNOSIS — Z800 Family history of malignant neoplasm of digestive organs: Secondary | ICD-10-CM | POA: Insufficient documentation

## 2024-02-01 DIAGNOSIS — Z87891 Personal history of nicotine dependence: Secondary | ICD-10-CM | POA: Diagnosis not present

## 2024-02-01 DIAGNOSIS — Z803 Family history of malignant neoplasm of breast: Secondary | ICD-10-CM | POA: Insufficient documentation

## 2024-02-01 DIAGNOSIS — C109 Malignant neoplasm of oropharynx, unspecified: Secondary | ICD-10-CM | POA: Insufficient documentation

## 2024-02-01 DIAGNOSIS — Z923 Personal history of irradiation: Secondary | ICD-10-CM | POA: Insufficient documentation

## 2024-02-01 MED ORDER — LETROZOLE 2.5 MG PO TABS: 2.5000 mg | ORAL_TABLET | Freq: Every day | ORAL | 3 refills | Status: DC

## 2024-02-01 NOTE — Progress Notes (Signed)
 [facility] CONSULT NOTE  Patient Care Team: Charlott Dorn LABOR, MD as PCP - General (Internal Medicine) Shlomo Wilbert SAUNDERS, MD as PCP - Cardiology (Cardiology) Inocencio Soyla Lunger, MD as PCP - Electrophysiology (Cardiology) Malmfelt, Delon CROME, RN as Oncology Nurse Navigator Loretha Ash, MD as Consulting Physician (Hematology and Oncology) Izell Domino, MD as Consulting Physician (Radiation Oncology) Luciano Standing, MD as Consulting Physician (Otolaryngology)  CHIEF COMPLAINTS/PURPOSE OF CONSULTATION:  Squamous cell carcinoma of the oropharynx  ASSESSMENT & PLAN:   Assessment and Plan Assessment & Plan Oropharyngeal cancer status post chemo radiation therapy Recent laryngoscopy and CT normal. Voice changes not concerning. Eating and weight stable. - No further PET scans required. - continue FU with ENT -Annual follow up with med onc.  At risk for iatrogenic hypothyroidism Potential radiation-induced thyroid  damage. Last thyroid  function test normal. - Monitor thyroid  function annually. - Coordinate thyroid  function tests with primary care provider.  HISTORY OF PRESENTING ILLNESS:   Clarence Andrade 78 y.o. male is here because of SCC oropharynx.  Interval history  History of Present Illness    Discussed the use of AI scribe software for clinical note transcription with the patient, who gave verbal consent to proceed.  History of Present Illness Clarence Andrade is a 78 year old male who presents for routine follow-up after cancer treatment.  He feels 'really good' and maintains a stable weight between 195 and 200 pounds. He is able to eat anything he wants but does so modestly. He underwent a laryngoscope on August 1st, which was normal, and has a follow-up scheduled in four months with Dr Amaryllis. He has not had any recent PET scans. His last CT scan in December was normal  His last thyroid  function test in December was normal, and he is  scheduled for another test this coming December.  He notes that his voice has changed, which he attributes to his treatment, but he is not concerned as he does not sing. He humorously mentions that his voice gets people's attention.  No issues with bowel movements and only minor urinary difficulties, which he attributes to an enlarged prostate, a condition he discusses with his urologist.  Rest of the pertinent 10 point ROS reviewed and neg.  Wt Readings from Last 3 Encounters:  02/01/24 202 lb 9.6 oz (91.9 kg)  07/04/23 205 lb (93 kg)  05/05/23 204 lb 4 oz (92.6 kg)     MEDICAL HISTORY:  Past Medical History:  Diagnosis Date   Abdominal hernia    Midline   Adrenal incidentaloma 2010   Aortic stenosis    Ascending aortic aneurysm    CAD (coronary artery disease)    a. Cath 03/2018 - CTO of the prox-mid LAD with bridging, L-L and R-L collaterals, moderate, non-obstructive disease involving the proximal LAD, proximal LCx, and distal RCA, more severe disease noted involving small branch vessels (RV marginal and superior branch of OM3).   Complete heart block (HCC)    Gallstones    Heart murmur    History of pacemaker    a. 11/2018 - Medtronic, intermittent CHB.   Hyperlipidemia    Morbid obesity (HCC)    Myocardial infarction (HCC)    NSVT (nonsustained ventricular tachycardia) (HCC)    OSA (obstructive sleep apnea)    Severe PSG 1/07 AHI 66/hr, O2 Nadir 50% Refuses treatment - Pt does not believe test was accurate   Pericarditis    Pneumonia    Pulmonary HTN (HCC)  mild with PASP by echo 02/2018, not seen on 03/2020 echo   Pulmonary nodule    PVC's (premature ventricular contractions)    Screening for AAA (abdominal aortic aneurysm) 2010   CT Abd/Pelvis   Squamous cell carcinoma of oropharynx (HCC)    Ventral hernia    03/2009    SURGICAL HISTORY: Past Surgical History:  Procedure Laterality Date   CHOLECYSTECTOMY N/A 10/11/2019   Procedure: LAPAROSCOPIC  CHOLECYSTECTOMY WITH INTRAOPERATIVE CHOLANGIOGRAM;  Surgeon: Eletha Boas, MD;  Location: [facility] ORS;  Service: General;  Laterality: N/A;   CORONARY ANGIOGRAPHY N/A 03/23/2018   Procedure: CORONARY ANGIOGRAPHY;  Surgeon: Mady Bruckner, MD;  Location: [facility] INVASIVE CV LAB;  Service: Cardiovascular;  Laterality: N/A;   HERNIA REPAIR  05/2002   three   IR GASTROSTOMY TUBE MOD SED  03/08/2022   IR GASTROSTOMY TUBE REMOVAL  08/01/2022   IR GJ TUBE CHANGE  04/15/2022   IR IMAGING GUIDED PORT INSERTION  03/08/2022   IR REMOVAL TUN ACCESS W/ PORT W/O FL MOD SED  08/01/2022   KNEE ARTHROSCOPY Left 09/2002   PACEMAKER IMPLANT N/A 11/08/2018   Procedure: PACEMAKER IMPLANT;  Surgeon: Inocencio Soyla Lunger, MD;  Location: [facility] INVASIVE CV LAB;  Service: Cardiovascular;  Laterality: N/A;   SHOULDER SURGERY Right 2020   TOTAL KNEE ARTHROPLASTY Bilateral 09/2010   VENTRAL HERNIA REPAIR  03/2009    SOCIAL HISTORY: Social History   Socioeconomic History   Marital status: Married    Spouse name: Not on file   Number of children: Not on file   Years of education: Not on file   Highest education level: Not on file  Occupational History   Not on file  Tobacco Use   Smoking status: Former    Current packs/day: 0.00    Types: Cigarettes    Quit date: 19    Years since quitting: 32.7   Smokeless tobacco: Never  Vaping Use   Vaping status: Never Used  Substance and Sexual Activity   Alcohol use: No   Drug use: No   Sexual activity: Not Currently  Other Topics Concern   Not on file  Social History Narrative   Not on file   Social Drivers of Health   Financial Resource Strain: Low Risk  (02/24/2022)   Overall Financial Resource Strain (CARDIA)    Difficulty of Paying Living Expenses: Not hard at all  Food Insecurity: No Food Insecurity (05/04/2022)   Hunger Vital Sign    Worried About Running Out of Food in the Last Year: Never true    Ran Out of Food in the Last Year: Never true   Transportation Needs: No Transportation Needs (05/04/2022)   PRAPARE - Administrator, Civil Service (Medical): No    Lack of Transportation (Non-Medical): No  Physical Activity: Not on file  Stress: Not on file  Social Connections: Not on file  Intimate Partner Violence: Not At Risk (05/04/2022)   Humiliation, Afraid, Rape, and Kick questionnaire    Fear of Current or Ex-Partner: No    Emotionally Abused: No    Physically Abused: No    Sexually Abused: No    FAMILY HISTORY: Family History  Problem Relation Age of Onset   Heart attack Father    Heart disease Father    Aneurysm Father    Breast cancer Sister    Colon cancer Sister     ALLERGIES:  is allergic to sertraline hcl and transderm-scop [scopolamine ].  MEDICATIONS:  Current Outpatient Medications  Medication Sig Dispense Refill   acetaminophen  (TYLENOL ) 500 MG tablet Take 1,000 mg by mouth every 6 (six) hours as needed (for pain).     aspirin  EC 81 MG tablet Take 81 mg by mouth daily.     buPROPion (WELLBUTRIN XL) 150 MG 24 hr tablet Take 150 mg by mouth daily. 150 mg, per tube, every morning     COVID-19 mRNA vaccine 2024-2025 (COMIRNATY ) syringe Inject 0.3 mLs into the muscle. 0.3 mL 0   feeding supplement, ENSURE COMPLETE, (ENSURE COMPLETE) LIQD Take 237 mLs by mouth See admin instructions. Mix 237 ml's of Ensure Complete with 237 ml's of milk = 3 feedings a day     midodrine  (PROAMATINE ) 10 MG tablet Take 1 tablet (10 mg total) by mouth 3 (three) times daily with meals. 270 tablet 3   nitroGLYCERIN  (NITROSTAT ) 0.4 MG SL tablet Place 1 tablet (0.4 mg total) under the tongue every 5 (five) minutes as needed for chest pain. 25 tablet 3   rosuvastatin  (CRESTOR ) 10 MG tablet TAKE 1 TABLET ONCE DAILY. 90 tablet 0   tamsulosin  (FLOMAX ) 0.4 MG CAPS capsule Take 0.4 mg by mouth daily.     traZODone (DESYREL) 50 MG tablet Take 50 mg by mouth at bedtime. Patient only taking 25mg      No current  facility-administered medications for this visit.     PHYSICAL EXAMINATION: ECOG PERFORMANCE STATUS: 0 - Asymptomatic  Vitals:   02/01/24 1300 02/01/24 1309  BP: (!) 141/96 126/67  Pulse: 78   Resp: 18   Temp: 98.4 F (36.9 C)   SpO2: 96%     Filed Weights   02/01/24 1300  Weight: 202 lb 9.6 oz (91.9 kg)     Physical Exam Constitutional:      General: He is not in acute distress.    Appearance: Normal appearance.  HENT:     Mouth/Throat:     Mouth: Mucous membranes are moist.  Musculoskeletal:        General: No swelling.     Cervical back: Normal range of motion and neck supple. No rigidity.  Lymphadenopathy:     Cervical: No cervical adenopathy.  Skin:    General: Skin is warm and dry.  Neurological:     Mental Status: He is alert.    LABORATORY DATA:  I have reviewed the data as listed Lab Results  Component Value Date   WBC 5.3 04/13/2023   HGB 13.8 04/13/2023   HCT 41.0 04/13/2023   MCV 98.3 04/13/2023   PLT 144 (L) 04/13/2023     Chemistry      Component Value Date/Time   NA 138 04/13/2023 1350   NA CANCELED 03/16/2023 1150   K 4.6 04/13/2023 1350   CL 102 04/13/2023 1350   CO2 29 04/13/2023 1350   BUN 22 04/13/2023 1350   BUN CANCELED 03/16/2023 1150   CREATININE 0.99 04/13/2023 1350   CREATININE 0.83 06/22/2022 1416      Component Value Date/Time   CALCIUM  9.1 04/13/2023 1350   ALKPHOS 63 04/13/2023 1350   AST 39 04/13/2023 1350   AST 23 06/22/2022 1416   ALT 20 04/13/2023 1350   ALT 17 06/22/2022 1416   BILITOT 0.6 04/13/2023 1350   BILITOT 0.5 09/06/2022 0913   BILITOT 0.8 06/22/2022 1416       RADIOGRAPHIC STUDIES: I have personally reviewed the radiological images as listed and agreed with the findings in the report. No results found.  All questions were answered. The  patient knows to call the clinic with any problems, questions or concerns. I spent 30 minutes in the care of this patient including H and P, review of  records, counseling and coordination of care.     Amber Stalls, MD 02/01/2024 1:14 PM

## 2024-02-02 ENCOUNTER — Encounter: Payer: Self-pay | Admitting: Hematology and Oncology

## 2024-02-05 ENCOUNTER — Ambulatory Visit (INDEPENDENT_AMBULATORY_CARE_PROVIDER_SITE_OTHER): Payer: PPO

## 2024-02-05 DIAGNOSIS — I442 Atrioventricular block, complete: Secondary | ICD-10-CM | POA: Diagnosis not present

## 2024-02-05 LAB — CUP PACEART REMOTE DEVICE CHECK
Battery Remaining Longevity: 110 mo
Battery Voltage: 3 V
Brady Statistic AP VP Percent: 32.4 %
Brady Statistic AP VS Percent: 5.74 %
Brady Statistic AS VP Percent: 36.33 %
Brady Statistic AS VS Percent: 25.53 %
Brady Statistic RA Percent Paced: 38.06 %
Brady Statistic RV Percent Paced: 68.73 %
Date Time Interrogation Session: 20250928223718
Implantable Lead Connection Status: 753985
Implantable Lead Connection Status: 753985
Implantable Lead Implant Date: 20200702
Implantable Lead Implant Date: 20200702
Implantable Lead Location: 753859
Implantable Lead Location: 753860
Implantable Lead Model: 5076
Implantable Lead Model: 5076
Implantable Pulse Generator Implant Date: 20200702
Lead Channel Impedance Value: 323 Ohm
Lead Channel Impedance Value: 323 Ohm
Lead Channel Impedance Value: 399 Ohm
Lead Channel Impedance Value: 418 Ohm
Lead Channel Pacing Threshold Amplitude: 0.5 V
Lead Channel Pacing Threshold Amplitude: 0.875 V
Lead Channel Pacing Threshold Pulse Width: 0.4 ms
Lead Channel Pacing Threshold Pulse Width: 0.4 ms
Lead Channel Sensing Intrinsic Amplitude: 2.875 mV
Lead Channel Sensing Intrinsic Amplitude: 2.875 mV
Lead Channel Sensing Intrinsic Amplitude: 5.875 mV
Lead Channel Sensing Intrinsic Amplitude: 5.875 mV
Lead Channel Setting Pacing Amplitude: 1.5 V
Lead Channel Setting Pacing Amplitude: 2 V
Lead Channel Setting Pacing Pulse Width: 0.4 ms
Lead Channel Setting Sensing Sensitivity: 2.8 mV
Zone Setting Status: 755011
Zone Setting Status: 755011

## 2024-02-06 ENCOUNTER — Ambulatory Visit: Payer: Self-pay | Admitting: Cardiology

## 2024-02-07 NOTE — Progress Notes (Signed)
 Remote PPM Transmission

## 2024-04-17 ENCOUNTER — Ambulatory Visit: Attending: Cardiology | Admitting: Physician Assistant

## 2024-04-17 ENCOUNTER — Encounter: Payer: Self-pay | Admitting: Physician Assistant

## 2024-04-17 VITALS — BP 118/62 | HR 65 | Ht 75.0 in | Wt 202.0 lb

## 2024-04-17 DIAGNOSIS — I2510 Atherosclerotic heart disease of native coronary artery without angina pectoris: Secondary | ICD-10-CM | POA: Diagnosis not present

## 2024-04-17 DIAGNOSIS — I493 Ventricular premature depolarization: Secondary | ICD-10-CM | POA: Diagnosis not present

## 2024-04-17 DIAGNOSIS — I959 Hypotension, unspecified: Secondary | ICD-10-CM

## 2024-04-17 DIAGNOSIS — E785 Hyperlipidemia, unspecified: Secondary | ICD-10-CM | POA: Diagnosis not present

## 2024-04-17 DIAGNOSIS — Z950 Presence of cardiac pacemaker: Secondary | ICD-10-CM | POA: Diagnosis not present

## 2024-04-17 DIAGNOSIS — I38 Endocarditis, valve unspecified: Secondary | ICD-10-CM | POA: Diagnosis not present

## 2024-04-17 DIAGNOSIS — I7121 Aneurysm of the ascending aorta, without rupture: Secondary | ICD-10-CM

## 2024-04-17 DIAGNOSIS — I4729 Other ventricular tachycardia: Secondary | ICD-10-CM | POA: Diagnosis not present

## 2024-04-17 LAB — COMPREHENSIVE METABOLIC PANEL WITH GFR
ALT: 18 IU/L (ref 0–44)
AST: 23 IU/L (ref 0–40)
Albumin: 4.2 g/dL (ref 3.8–4.8)
Alkaline Phosphatase: 80 IU/L (ref 47–123)
BUN/Creatinine Ratio: 25 — ABNORMAL HIGH (ref 10–24)
BUN: 25 mg/dL (ref 8–27)
Bilirubin Total: 0.8 mg/dL (ref 0.0–1.2)
CO2: 26 mmol/L (ref 20–29)
Calcium: 9.1 mg/dL (ref 8.6–10.2)
Chloride: 99 mmol/L (ref 96–106)
Creatinine, Ser: 1 mg/dL (ref 0.76–1.27)
Globulin, Total: 2 g/dL (ref 1.5–4.5)
Glucose: 92 mg/dL (ref 70–99)
Potassium: 5.1 mmol/L (ref 3.5–5.2)
Sodium: 138 mmol/L (ref 134–144)
Total Protein: 6.2 g/dL (ref 6.0–8.5)
eGFR: 77 mL/min/1.73 (ref >59)

## 2024-04-17 LAB — LIPID PANEL
Chol/HDL Ratio: 1.9 ratio (ref 0.0–5.0)
Cholesterol, Total: 138 mg/dL (ref 100–199)
HDL: 73 mg/dL (ref >39)
LDL Chol Calc (NIH): 53 mg/dL (ref 0–99)
Triglycerides: 54 mg/dL (ref 0–149)
VLDL Cholesterol Cal: 12 mg/dL (ref 5–40)

## 2024-04-17 LAB — CBC
Hematocrit: 45.7 % (ref 37.5–51.0)
Hemoglobin: 15.3 g/dL (ref 13.0–17.7)
MCH: 33.3 pg — ABNORMAL HIGH (ref 26.6–33.0)
MCHC: 33.5 g/dL (ref 31.5–35.7)
MCV: 99 fL — ABNORMAL HIGH (ref 79–97)
Platelets: 173 x10E3/uL (ref 150–450)
RBC: 4.6 x10E6/uL (ref 4.14–5.80)
RDW: 12.3 % (ref 11.6–15.4)
WBC: 5 x10E3/uL (ref 3.4–10.8)

## 2024-04-17 LAB — MAGNESIUM: Magnesium: 2.2 mg/dL (ref 1.6–2.3)

## 2024-04-17 NOTE — Patient Instructions (Signed)
 Medication Instructions:  NO CHANGES  Lab Work: CMET, CBC, FASTING LIPID PANEL, AND MAGNESIUM  TO BE DONE TODAY. BMET TO BE DONE 1 WEEK BEFORE CT CHEST SCAN.  Testing/Procedures: TO BE DONE NEXT AVAILABLE.  Non-Cardiac CT Angiography (CTA), is a special type of CT scan that uses a computer to produce multi-dimensional views of major blood vessels throughout the body. In CT angiography, a contrast material is injected through an IV to help visualize the blood vessels   Follow-Up: At [facility], you and your health needs are our priority.  As part of our continuing mission to provide you with exceptional heart care, our providers are all part of one team.  This team includes your primary Cardiologist (physician) and Advanced Practice Providers or APPs (Physician Assistants and Nurse Practitioners) who all work together to provide you with the care you need, when you need it.  Your next appointment:   WITH DAYNA DUNN, PA-C AFTER CHEST SCAN WITH EP IN [facility] 2026  Provider:   DAYNA DUNN, PA-C

## 2024-04-17 NOTE — Progress Notes (Signed)
 Cardiology Office Note    Date:  04/17/2024  ID:  Clarence Andrade, DOB 1946/01/17, MRN [id number] PCP:  Charlott Dorn LABOR, MD  Cardiologist:  Wilbert Bihari, MD / primary APP - Raphael Bring PA-C Electrophysiologist:  Soyla Gladis Norton, MD   Chief Complaint: f/u CAD, ascending TAA  History of Present Illness: .    Clarence Andrade is a 78 y.o. male with visit-pertinent history of CAD by cath 03/2018, ascending TAA, mild AS/MR/AI, CHB s/p Medtronic PPM 11/2018, pericarditis, prior hyperglycemia, hyponatremia, dyslipidemia, pulmonary nodule, adrenal incidentaloma, mild pulmonary HTN (normal by subsequent echoes), brief NSVT and frequent PVCs per PPM interrogations, prior HTN more recently complicated by hypotension, squamous cell carcinoma of oropharynx, morbid obesity, ventral hernia who presents for follow-up.    He carries remote diagnosis of pericarditis. In 2019 he developed chest pain. Nuc was abnormal prompting cath with CTO of the prox-mid LAD with bridging, L-L and R-L collaterals, moderate non-obstructive disease involving the proximal LAD, proximal LCx, and distal RCA, more severe disease noted involving small branch vessels (RV marginal and superior branch of OM3). Cath notable for inability to cross the aortic valve due to insufficient catheter length complicated by tortuous subclavian artery and dilated ascending aorta. Antianginal therapy was titrated with recommendation to reserve CTO PCI for refractory angina. Cardiac MRI 04/2018 ordered by [name] prior to cath showed no evidence of recurrent pericarditis. He has chronic fatigue but has declined evaluation for OSA. He developed CHB requiring PPM 11/2018. Prior device interrogations have shown short runs of NSVT prompting metoprolol , which later had to be discontinued in 2023 due to hypotension and falls. This was in the setting of multiple medical issues related squamous cell carcinoma of the oropharynx with weight loss, anasarca and  electrolyte disturbance. He was started on midodrine . He could not d/c Flomax  as he'd developed UTI off this. He saw EP in 06/2022 and was restarted Toprol  25mg  nightly due to frequent PVCs and 2 episodes of NSVT on device interrogation. He was seen in the ED the following day with hypotension and mechanical fall, so metoprolol  was discontinued. He was fortunately able to be discharged same day. We have also followed his TAA, last assessed by CT chest 04/2023 at 4.4x4.3cm, unchanged. Last echo 04/2023 showed EF 60-65%, G1DD, mildly enlarged RV, mild AS/MR/AI.  He returns for follow-up today doing well. The last year has been much more uneventful than prior. Now only experiences orthostatic-type dizziness once a month - has noticed the episodes tend to happen more if he goes from recliner to standing than desk to standing. Still doing water  aerobics 4-5x/week. No CP, SOB, palpitations, syncope, edema.  Labwork independently reviewed: 04/2023 TSH OK, K 4.6, Cr 0.99, alb 3.8, AST ALT OK, Hgb 13.8, plt 144 c/w prior 08/2022 LDL 54, trig 54, Mg 2.3  ROS: .    Please see the history of present illness.  All other systems are reviewed and otherwise negative.  Studies Reviewed: SABRA    EKG:  EKG is ordered today, personally reviewed, demonstrating  EKG Interpretation Date/Time:  Wednesday April 17 2024 09:11:38 EST Ventricular Rate:  65 PR Interval:  188 QRS Duration:  162 QT Interval:  470 QTC Calculation: 488 R Axis:   -31  Text Interpretation: Atrial-sensed ventricular-paced rhythm Nonspecific T wave abnormality Confirmed by Clenton Esper [phone]) on 04/17/2024 9:18:27 AM    CV Studies: Cardiac studies reviewed are outlined and summarized above. Otherwise please see EMR for full report.   Current  Reported Medications:.    Current Meds  Medication Sig   acetaminophen  (TYLENOL ) 500 MG tablet Take 1,000 mg by mouth every 6 (six) hours as needed (for pain).   aspirin  EC 81 MG tablet Take 81 mg  by mouth daily.   buPROPion (WELLBUTRIN XL) 150 MG 24 hr tablet Take 150 mg by mouth daily. 150 mg, per tube, every morning   COVID-19 mRNA vaccine 2024-2025 (COMIRNATY ) syringe Inject 0.3 mLs into the muscle.   feeding supplement, ENSURE COMPLETE, (ENSURE COMPLETE) LIQD Take 237 mLs by mouth See admin instructions. Mix 237 ml's of Ensure Complete with 237 ml's of milk = 3 feedings a day   letrozole  (FEMARA ) 2.5 MG tablet Take 1 tablet (2.5 mg total) by mouth daily.   midodrine  (PROAMATINE ) 10 MG tablet Take 1 tablet (10 mg total) by mouth 3 (three) times daily with meals.   nitroGLYCERIN  (NITROSTAT ) 0.4 MG SL tablet Place 1 tablet (0.4 mg total) under the tongue every 5 (five) minutes as needed for chest pain.   rosuvastatin  (CRESTOR ) 10 MG tablet TAKE 1 TABLET ONCE DAILY.   tamsulosin  (FLOMAX ) 0.4 MG CAPS capsule Take 0.4 mg by mouth daily.   traZODone (DESYREL) 50 MG tablet Take 50 mg by mouth at bedtime. Patient only taking 25mg     Physical Exam:    VS:  BP 118/62 (BP Location: Left Arm, Patient Position: Sitting, Cuff Size: Normal)   Pulse 65   Ht 6' 3 (1.905 m)   Wt 202 lb (91.6 kg)   BMI 25.25 kg/m    Wt Readings from Last 3 Encounters:  04/17/24 202 lb (91.6 kg)  02/01/24 202 lb 9.6 oz (91.9 kg)  07/04/23 205 lb (93 kg)    GEN: Well nourished, well developed in no acute distress NECK: No JVD; No carotid bruits CARDIAC: RRR, soft SEM RUSB c/w prior, no rubs or gallops RESPIRATORY:  Clear to auscultation without rales, wheezing or rhonchi  ABDOMEN: Soft, non-tender, non-distended EXTREMITIES:  No edema; No acute deformity   Asessement and Plan:.    1. Hypotension - occurred primarily after cancer diagnosis and the weight loss that followed. He still experiences episodic orthostasis but very rare compared to previous years - only 1x/month and now aware that recliner to standing is a trigger. Favor continuing midodrine  given current blood pressure and stability. Can revisit in  the future if he develops hypertension on this. Continue cautious position changes. He is doing great with water  aerobics as well.  2. CAD, dyslipidemia - remains asymptomatic. No longer on empiric antianginal therapy due to hypotension. No indication to revisit at this time. Continue ASA 81mg  daily, rosuvastatin  10mg  daily. He previously wished to defer higher intensity statin. Checking CBC, CMET, lipid profile today.  3. Mild AS/MR/AI - stable by last echo 04/2023, consider repeat echo 2027 or as clinically indicated.  4. Ascending TAA - stable by CT 04/2023. Discussed annual follow-up, he prefers to go to 2 year follow-up. Will arrange CT angio aorta w wo contrast in 04/2025 with BMET beforehand. Aneurysm precautions previously discussed at prior OV. BP controlled.  5. H/o PPM, NSVT, PVCs -  followed by EP. Did not previously tolerate even low dose beta blocker due to hypotension/fall. Will check lytes today as he is due for updated labs. Encouraged EP f/u due 06/2024.     Disposition: F/u with EP 06/2024 as recommended and me in 04/2025 after CTA.  Signed, Juvencio Verdi N Kiven Vangilder, PA-C

## 2024-04-18 ENCOUNTER — Ambulatory Visit: Payer: Self-pay | Admitting: Physician Assistant

## 2024-04-24 NOTE — Telephone Encounter (Signed)
 Hi pharmacy team, are there any updated recommendations about safest NSAID in cardiovascular disease? Hx of obstructive CAD managed medically, no recent anginal issues. I would be OK with short course as long as taking with food, just looking for recommendation on best data.

## 2024-04-25 NOTE — Telephone Encounter (Signed)
 Dayna - I usually tell patients that a short course of oral NSAIDs is fine, as is the occasional injection.  We don't want them to be miserable, just understand the risk.  For oral, I usually recommend Aleve 1 tab bid x 2-3 days, with food.  If they can get by with doing this once or twice a month (for the chronic pain people), they usually do okay.

## 2024-05-06 ENCOUNTER — Ambulatory Visit (INDEPENDENT_AMBULATORY_CARE_PROVIDER_SITE_OTHER): Payer: PPO

## 2024-05-06 DIAGNOSIS — I442 Atrioventricular block, complete: Secondary | ICD-10-CM

## 2024-05-07 LAB — CUP PACEART REMOTE DEVICE CHECK
Battery Remaining Longevity: 107 mo
Battery Voltage: 2.99 V
Brady Statistic AP VP Percent: 27.57 %
Brady Statistic AP VS Percent: 7.04 %
Brady Statistic AS VP Percent: 35.16 %
Brady Statistic AS VS Percent: 30.23 %
Brady Statistic RA Percent Paced: 34.41 %
Brady Statistic RV Percent Paced: 62.73 %
Date Time Interrogation Session: 20251228194006
Implantable Lead Connection Status: 753985
Implantable Lead Connection Status: 753985
Implantable Lead Implant Date: 20200702
Implantable Lead Implant Date: 20200702
Implantable Lead Location: 753859
Implantable Lead Location: 753860
Implantable Lead Model: 5076
Implantable Lead Model: 5076
Implantable Pulse Generator Implant Date: 20200702
Lead Channel Impedance Value: 304 Ohm
Lead Channel Impedance Value: 323 Ohm
Lead Channel Impedance Value: 399 Ohm
Lead Channel Impedance Value: 418 Ohm
Lead Channel Pacing Threshold Amplitude: 0.5 V
Lead Channel Pacing Threshold Amplitude: 0.75 V
Lead Channel Pacing Threshold Pulse Width: 0.4 ms
Lead Channel Pacing Threshold Pulse Width: 0.4 ms
Lead Channel Sensing Intrinsic Amplitude: 2.625 mV
Lead Channel Sensing Intrinsic Amplitude: 2.625 mV
Lead Channel Sensing Intrinsic Amplitude: 8.375 mV
Lead Channel Sensing Intrinsic Amplitude: 8.375 mV
Lead Channel Setting Pacing Amplitude: 1.5 V
Lead Channel Setting Pacing Amplitude: 2 V
Lead Channel Setting Pacing Pulse Width: 0.4 ms
Lead Channel Setting Sensing Sensitivity: 2.8 mV
Zone Setting Status: 755011
Zone Setting Status: 755011

## 2024-05-11 ENCOUNTER — Ambulatory Visit: Payer: Self-pay | Admitting: Cardiology

## 2024-05-15 ENCOUNTER — Other Ambulatory Visit: Payer: Self-pay

## 2024-05-15 DIAGNOSIS — C01 Malignant neoplasm of base of tongue: Secondary | ICD-10-CM

## 2024-05-15 DIAGNOSIS — C109 Malignant neoplasm of oropharynx, unspecified: Secondary | ICD-10-CM

## 2024-05-15 NOTE — Progress Notes (Signed)
 Remote PPM Transmission

## 2024-05-17 ENCOUNTER — Ambulatory Visit: Payer: Self-pay | Admitting: Radiation Oncology

## 2024-05-17 ENCOUNTER — Ambulatory Visit (HOSPITAL_COMMUNITY)
Admission: RE | Admit: 2024-05-17 | Discharge: 2024-05-17 | Disposition: A | Source: Ambulatory Visit | Attending: Radiation Oncology | Admitting: Radiation Oncology

## 2024-05-17 DIAGNOSIS — C01 Malignant neoplasm of base of tongue: Secondary | ICD-10-CM | POA: Diagnosis present

## 2024-05-17 MED ORDER — IOHEXOL 300 MG/ML  SOLN
100.0000 mL | Freq: Once | INTRAMUSCULAR | Status: AC | PRN
  Administered 2024-05-17: 100 mL via INTRAVENOUS

## 2024-05-17 NOTE — Progress Notes (Signed)
 Pain issues, if any: *** Using a feeding tube?: *** Weight changes, if any: *** Swallowing issues, if any: *** Smoking or chewing tobacco? *** Using fluoride toothpaste daily? *** Last ENT visit was on: *** Other notable issues, if any: ***

## 2024-05-23 ENCOUNTER — Telehealth: Payer: Self-pay | Admitting: *Deleted

## 2024-05-23 NOTE — Telephone Encounter (Signed)
 Called patient to remind of lab and fu for 05-24-24, spoke with patient and he is aware of these appts.

## 2024-05-24 ENCOUNTER — Ambulatory Visit
Admission: RE | Admit: 2024-05-24 | Discharge: 2024-05-24 | Disposition: A | Discharge status: Home / Self Care | Payer: Self-pay | Source: Ambulatory Visit | Attending: Radiation Oncology | Admitting: Radiation Oncology

## 2024-05-24 ENCOUNTER — Ambulatory Visit
Admission: RE | Admit: 2024-05-24 | Discharge: 2024-05-24 | Disposition: A | Discharge status: Home / Self Care | Source: Ambulatory Visit | Attending: Hematology and Oncology | Admitting: Hematology and Oncology

## 2024-05-24 ENCOUNTER — Other Ambulatory Visit: Payer: Self-pay

## 2024-05-24 VITALS — BP 126/77 | HR 68 | Temp 97.3°F | Resp 16 | Ht 75.0 in | Wt 204.0 lb

## 2024-05-24 DIAGNOSIS — Z85810 Personal history of malignant neoplasm of tongue: Secondary | ICD-10-CM | POA: Insufficient documentation

## 2024-05-24 DIAGNOSIS — I890 Lymphedema, not elsewhere classified: Secondary | ICD-10-CM | POA: Insufficient documentation

## 2024-05-24 DIAGNOSIS — I700 Atherosclerosis of aorta: Secondary | ICD-10-CM | POA: Insufficient documentation

## 2024-05-24 DIAGNOSIS — Z79899 Other long term (current) drug therapy: Secondary | ICD-10-CM | POA: Insufficient documentation

## 2024-05-24 DIAGNOSIS — Z923 Personal history of irradiation: Secondary | ICD-10-CM | POA: Insufficient documentation

## 2024-05-24 DIAGNOSIS — I2510 Atherosclerotic heart disease of native coronary artery without angina pectoris: Secondary | ICD-10-CM | POA: Insufficient documentation

## 2024-05-24 DIAGNOSIS — M479 Spondylosis, unspecified: Secondary | ICD-10-CM | POA: Insufficient documentation

## 2024-05-24 DIAGNOSIS — C01 Malignant neoplasm of base of tongue: Secondary | ICD-10-CM

## 2024-05-24 DIAGNOSIS — R918 Other nonspecific abnormal finding of lung field: Secondary | ICD-10-CM | POA: Insufficient documentation

## 2024-05-24 DIAGNOSIS — C109 Malignant neoplasm of oropharynx, unspecified: Secondary | ICD-10-CM

## 2024-05-24 DIAGNOSIS — I517 Cardiomegaly: Secondary | ICD-10-CM | POA: Insufficient documentation

## 2024-05-24 DIAGNOSIS — Z7982 Long term (current) use of aspirin: Secondary | ICD-10-CM | POA: Insufficient documentation

## 2024-05-24 LAB — TSH: TSH: 2.18 u[IU]/mL (ref 0.350–4.500)

## 2024-05-24 NOTE — Progress Notes (Signed)
 Oncology Nurse Navigator Documentation   I met with Clarence Andrade before and during his follow up with Dr. Izell today. He received his CT scan results today. He is feeling well and happy with his recovery from his head and neck cancer. At Dr. Charisse request I have placed a referral to head and neck survivorship to be seen in 6 months. He knows to call me if he has any concerns or questions in the future.   Delon Jefferson RN, BSN, OCN Head & Neck Oncology Nurse Navigator [facility] Phone # [phone]  Fax # [phone]

## 2024-05-24 NOTE — Progress Notes (Signed)
 " Radiation Oncology         [phone] ________________________________  Name: Clarence Andrade MRN: [id number]  Date: 05/24/2024  DOB: July 12, 1945  Follow-Up Visit Note  CC: Clarence Andrade LABOR, MD  Clarence Standing, MD  Diagnosis and Prior Radiotherapy:       ICD-10-CM   1. Malignant neoplasm of base of tongue (HCC)  C01        Cancer Staging  Malignant neoplasm of base of tongue (HCC) Staging form: Pharynx - HPV-Mediated Oropharynx, AJCC 8th Edition - Clinical stage from 02/18/2022: Stage II (cT3, cN1, cM0, p16+) - Unsigned Stage prefix: Initial diagnosis  Squamous cell carcinoma of oropharynx (HCC) Staging form: Pharynx - HPV-Mediated Oropharynx, AJCC 8th Edition - Clinical: Stage I (cT2, cN1, cM0, p16+) - Signed by Loretha Ash, MD on 02/17/2022 Stage prefix: Initial diagnosis ==========DELIVERED PLANS==========  First Treatment Date: 2022-03-09 - Last Treatment Date: 2022-04-25   Plan Name: HN_BOT Site: Oropharynx Technique: IMRT Mode: Photon Dose Per Fraction: 2 Gy Prescribed Dose (Delivered / Prescribed): 66 Gy / 70 Gy Prescribed Fxs (Delivered / Prescribed): 33 / 35 (patient ended treatment prematurely)  CHIEF COMPLAINT:  Here for follow-up and surveillance of throat cancer   NARRATIVE: Clarence Andrade returns to the clinic for a follow up. He completed radiation treatment for malignant neoplasm of base of tongue on 04/25/2022.  Pain issues, if any: Denies any throat pain Using a feeding tube?: None Weight changes, if any: None Swallowing issues, if any: Slight difficulty swallowing certain foods Smoking or chewing tobacco? None Using fluoride toothpaste daily? Yes Last ENT visit was on: November 2025 Other notable issues, if any: None   ALLERGIES:  is allergic to sertraline hcl and transderm-scop [scopolamine ].  Meds: Current Outpatient Medications  Medication Sig Dispense Refill   acetaminophen  (TYLENOL ) 500 MG tablet Take 1,000 mg by mouth every 6  (six) hours as needed (for pain).     aspirin  EC 81 MG tablet Take 81 mg by mouth daily.     buPROPion (WELLBUTRIN XL) 150 MG 24 hr tablet Take 150 mg by mouth daily. 150 mg, per tube, every morning     feeding supplement, ENSURE COMPLETE, (ENSURE COMPLETE) LIQD Take 237 mLs by mouth See admin instructions. Mix 237 ml's of Ensure Complete with 237 ml's of milk = 3 feedings a day     midodrine  (PROAMATINE ) 10 MG tablet Take 1 tablet (10 mg total) by mouth 3 (three) times daily with meals. 270 tablet 3   nitroGLYCERIN  (NITROSTAT ) 0.4 MG SL tablet Place 1 tablet (0.4 mg total) under the tongue every 5 (five) minutes as needed for chest pain. 25 tablet 3   rosuvastatin  (CRESTOR ) 10 MG tablet TAKE 1 TABLET ONCE DAILY. 90 tablet 0   tamsulosin  (FLOMAX ) 0.4 MG CAPS capsule Take 0.4 mg by mouth daily.     traZODone (DESYREL) 50 MG tablet Take 50 mg by mouth at bedtime. Patient only taking 25mg      COVID-19 mRNA vaccine 2024-2025 (COMIRNATY ) syringe Inject 0.3 mLs into the muscle. 0.3 mL 0   No current facility-administered medications for this encounter.    Physical Findings: The patient is in no acute distress. Patient is alert and oriented. Wt Readings from Last 3 Encounters:  05/24/24 204 lb (92.5 kg)  04/17/24 202 lb (91.6 kg)  02/01/24 202 lb 9.6 oz (91.9 kg)    height is 6' 3 (1.905 m) and weight is 204 lb (92.5 kg). His temporal temperature is 97.3 F (36.3 C) (abnormal).  His blood pressure is 126/77 and his pulse is 68. His respiration is 16 and oxygen saturation is 100%. .  General: Alert and oriented, in no acute distress HEENT: Head is normocephalic. Extraocular movements are intact. Oropharynx is notable for no thrush or visible tumor  Neck: Neck is notable for no masses. Well healing skin in the radiation field.  Moderate anterior neck lymphedema present.   Skin: well healed skin in treatment fields Heart: Regular in rate and rhythm  - murmur loudest at aortic value Chest: Clear to  auscultation bilaterally, with no rhonchi, wheezes, or rales. Lymphatics: see Neck Exam Abd: Soft, nontender, not distended Psychiatric: Judgment and insight are intact. Affect is appropriate.   Lab Findings: Lab Results  Component Value Date   WBC 5.0 04/17/2024   HGB 15.3 04/17/2024   HCT 45.7 04/17/2024   MCV 99 (H) 04/17/2024   PLT 173 04/17/2024    Lab Results  Component Value Date   TSH 2.224 04/13/2023    Radiographic Findings: CT Soft Tissue Neck W Contrast Result Date: 05/23/2024 EXAM: CT NECK WITH CONTRAST 05/17/2024 02:07:56 PM TECHNIQUE: CT of the neck was performed with the administration of 100 mL of Omnipaque  300 intravenous contrast. Multiplanar reformatted images are provided for review. Automated exposure control, iterative reconstruction, and/or weight based adjustment of the mA/kV was utilized to reduce the radiation dose to as low as reasonably achievable. COMPARISON: CT Neck with contrast 21/18/2024; 02/02/2022. CLINICAL HISTORY: Head/neck cancer, monitor. FINDINGS: AERODIGESTIVE TRACT: Post radiation changes again noted. No residual or recurrent mass lesion is present at the base of tongue. No edema. SALIVARY GLANDS: The parotid and submandibular glands are unremarkable. THYROID : Unremarkable. LYMPH NODES: No suspicious cervical lymphadenopathy. SOFT TISSUES: A left subclavian pacemaker is again noted. No mass or fluid collection. BONES: No abnormality. OTHER: Atherosclerotic calcification at the carotid bifurcations and aortic arch are similar to prior study. No significant stenosis is present. Visualized sinuses and mastoid air cells are well aerated. Visualized lungs are clear. IMPRESSION: 1. No evidence of residual or recurrent mass lesion at the base of tongue. 2. Post-radiation changes in the neck. Electronically signed by: Lonni Necessary MD 05/23/2024 09:47 AM EST RP Workstation: [id number]   CT Chest W Contrast Result Date: 05/23/2024 CLINICAL DATA:   Head/neck cancer, monitor History of malignant neoplasm of the base of the tongue. Post chemo radiation therapy. * Tracking Code: BO * EXAM: CT CHEST WITH CONTRAST TECHNIQUE: Multidetector CT imaging of the chest was performed during intravenous contrast administration. RADIATION DOSE REDUCTION: This exam was performed according to the departmental dose-optimization program which includes automated exposure control, adjustment of the mA and/or kV according to patient size and/or use of iterative reconstruction technique. CONTRAST:  OMNIPAQUE  IOHEXOL  300 MG/ML  SOLN COMPARISON:  Chest CT 04/26/2023 and 10/20/2021.  PET-CT 07/25/2022. FINDINGS: Neck findings dictated separately. Cardiovascular: No acute vascular findings. There is atherosclerosis of the aorta, great vessels and coronary arteries with stable aortic tortuosity. Left subclavian pacemaker leads extend into the right atrium and right ventricle. There are calcifications of the aortic valve and mild cardiac enlargement. No significant pericardial fluid. Mediastinum/Nodes: There are no enlarged mediastinal, hilar or axillary lymph nodes. The thyroid  gland, trachea and esophagus demonstrate no significant findings. Lungs/Pleura: No pleural effusion or pneumothorax. Stable scattered subpleural scarring. Stable 4 mm left lower lobe nodule on image 121/5, consistent with a benign finding. No new, enlarging or suspicious pulmonary nodules. No confluent airspace disease. Upper abdomen: No acute findings are seen in  the visualized upper abdomen status post cholecystectomy. Musculoskeletal/Chest wall: There is no chest wall mass or suspicious osseous finding. Multilevel spondylosis. IMPRESSION: 1. Stable chest CT without evidence of metastatic disease. 2. Neck findings dictated separately. 3. Aortic Atherosclerosis (ICD10-I70.0). Aortic valvular calcifications. Electronically Signed   By: Elsie Perone M.D.   On: 05/23/2024 09:06   CUP PACEART REMOTE DEVICE  CHECK Result Date: 05/07/2024 PPM Scheduled remote reviewed. Normal device function.  Presenting rhythm:  AS-VP, PACs.  1 VHR consistent with 8 beat NSVT.  3 device classified SVT, EGMs consistent with ST. Next remote transmission per protocol. - CS, CVRS   Impression/Plan:    1) Head and Neck Cancer Status:  Exam and CT of the neck/chest with contrast show NED, doing well and following closely with ENT with negative exams as well.  Laryngoscopy deferred today as he saw ENT recently  2) Nutritional Status:   no complaints Wt Readings from Last 3 Encounters:  05/24/24 204 lb (92.5 kg)  04/17/24 202 lb (91.6 kg)  02/01/24 202 lb 9.6 oz (91.9 kg)   3) Risk Factors: not smoking  4) Swallowing: functional, continue SLP.   5) Mild lymphedema present on PE today, stable.  Patient has seen physical therapy in the past and will continue massage therapy at home  6) Dental: Encouraged to continue regular followup with dentistry   7) Thyroid  function: Recommended he continue following this annually with his PCP given his history of neck radiation.  We did check this today and the results are pending.  He states he will get future labs with his PCP to save time and needlesticks Lab Results  Component Value Date   TSH 2.224 04/13/2023    8) I consider him cured.  We will enroll him in our head and neck survivorship program in 6 months.  He will continue to see ENT as scheduled.  I will see him back as needed.  He is interested in minimizing appointments and will talk about this with our head and neck survivorship provider.     On date of service, in total, I spent 25 minutes on this encounter. Patient was seen in person. _____________________________________   Lauraine Golden, MD "

## 2024-06-11 NOTE — Progress Notes (Unsigned)
" °  Electrophysiology Office Note:   Date:  06/12/2024  ID:  Clarence Andrade, DOB Jun 09, 1945, MRN [id number]  Primary Cardiologist: Wilbert Bihari, MD Primary Heart Failure: None Electrophysiologist: Will Gladis Norton, MD       History of Present Illness:   Clarence Andrade is a 79 y.o. 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   Seen last year in clinic and had lost a lot of weight (>100lbs) after XRT. Had dizziness with position changes on max dose midodrine .   Since last being seen in our clinic the patient reports doing well overall. He continues to have dizziness with position change and is very cautious.  No pacemaker specific concerns. Reports he shoveled his drive with his wife after the snow.  He noted he had to rest but this is not unusual for him.  He denies chest pain, palpitations, dyspnea, PND, orthopnea, nausea, vomiting, dizziness, syncope, edema, weight gain, or early satiety.   Review of systems complete and found to be negative unless listed in HPI.    EP Information / Studies Reviewed:    EKG is not ordered today. EKG from 04/17/24 reviewed which showed ASVP 65 bpm      PPM Interrogation-  reviewed in detail today,  See PACEART report.  Arrhythmia/Device History: Medtronic Dual Chamber PPM implanted 11/08/2018 for intermittent CHB  Physical Exam:   VS:  BP 138/78   Pulse 72   Ht 6' (1.829 m)   Wt 204 lb 6.4 oz (92.7 kg)   SpO2 96%   BMI 27.72 kg/m    Wt Readings from Last 3 Encounters:  06/12/24 204 lb 6.4 oz (92.7 kg)  05/24/24 204 lb (92.5 kg)  04/17/24 202 lb (91.6 kg)     GEN: Well nourished, well developed in no acute distress NECK: No JVD; No carotid bruits CARDIAC: Regular rate and rhythm, no murmurs, rubs, gallops. PPM site wnl, no tethering RESPIRATORY:  Clear to auscultation without rales, wheezing or  rhonchi  ABDOMEN: Soft, non-tender, non-distended EXTREMITIES:  trace ankle edema; No deformity   ASSESSMENT AND PLAN:    Intermittent CHB s/p Medtronic PPM  -Normal PPM function - ASVS on check -See Pace Art report -No changes today  PVC's / NSVT  -low burden on device / asymptomatic   CAD  VHD: AS  Hx of CTO of LAD  -no anginal symptoms  -follows with Cardiology    Chronic Hypotension  -midodrine  10 mg TID   -hydration and slow position changes   Squamous Cell of Tongue  -per ONC > per last note, he has been referred to Head/Neck survivorship program    Disposition:   Follow up with EP APP / Dr. Norton in 12 months  Signed, Daphne Barrack, NP-C, AGACNP-BC [facility] - Electrophysiology  06/12/2024, 12:38 PM  "

## 2024-06-12 ENCOUNTER — Ambulatory Visit: Admitting: Pulmonary Disease

## 2024-06-12 ENCOUNTER — Encounter: Payer: Self-pay | Admitting: Pulmonary Disease

## 2024-06-12 VITALS — BP 138/78 | HR 72 | Ht 72.0 in | Wt 204.4 lb

## 2024-06-12 DIAGNOSIS — I4729 Other ventricular tachycardia: Secondary | ICD-10-CM

## 2024-06-12 DIAGNOSIS — I9589 Other hypotension: Secondary | ICD-10-CM | POA: Diagnosis not present

## 2024-06-12 DIAGNOSIS — I442 Atrioventricular block, complete: Secondary | ICD-10-CM | POA: Diagnosis not present

## 2024-06-12 DIAGNOSIS — I493 Ventricular premature depolarization: Secondary | ICD-10-CM | POA: Diagnosis not present

## 2024-06-12 DIAGNOSIS — Z950 Presence of cardiac pacemaker: Secondary | ICD-10-CM | POA: Diagnosis not present

## 2024-06-12 LAB — CUP PACEART INCLINIC DEVICE CHECK
Battery Remaining Longevity: 108 mo
Battery Voltage: 3 V
Brady Statistic AP VP Percent: 26.19 %
Brady Statistic AP VS Percent: 8.05 %
Brady Statistic AS VP Percent: 33.04 %
Brady Statistic AS VS Percent: 32.72 %
Brady Statistic RA Percent Paced: 34.3 %
Brady Statistic RV Percent Paced: 59.22 %
Date Time Interrogation Session: 20260204123955
Implantable Lead Connection Status: 753985
Implantable Lead Connection Status: 753985
Implantable Lead Implant Date: 20200702
Implantable Lead Implant Date: 20200702
Implantable Lead Location: 753859
Implantable Lead Location: 753860
Implantable Lead Model: 5076
Implantable Lead Model: 5076
Implantable Pulse Generator Implant Date: 20200702
Lead Channel Impedance Value: 342 Ohm
Lead Channel Impedance Value: 361 Ohm
Lead Channel Impedance Value: 418 Ohm
Lead Channel Impedance Value: 456 Ohm
Lead Channel Pacing Threshold Amplitude: 0.5 V
Lead Channel Pacing Threshold Amplitude: 0.875 V
Lead Channel Pacing Threshold Pulse Width: 0.4 ms
Lead Channel Pacing Threshold Pulse Width: 0.4 ms
Lead Channel Sensing Intrinsic Amplitude: 10 mV
Lead Channel Sensing Intrinsic Amplitude: 2.25 mV
Lead Channel Sensing Intrinsic Amplitude: 2.5 mV
Lead Channel Sensing Intrinsic Amplitude: 8.125 mV
Lead Channel Setting Pacing Amplitude: 1.5 V
Lead Channel Setting Pacing Amplitude: 2 V
Lead Channel Setting Pacing Pulse Width: 0.4 ms
Lead Channel Setting Sensing Sensitivity: 2.8 mV
Zone Setting Status: 755011
Zone Setting Status: 755011

## 2024-06-12 NOTE — Patient Instructions (Signed)
 Medication Instructions:  Your physician recommends that you continue on your current medications as directed. Please refer to the Current Medication list given to you today.  *If you need a refill on your cardiac medications before your next appointment, please call your pharmacy*  Lab Work: None ordered If you have labs (blood work) drawn today and your tests are completely normal, you will receive your results only by: MyChart Message (if you have MyChart) OR A paper copy in the mail If you have any lab test that is abnormal or we need to change your treatment, we will call you to review the results.  Follow-Up: At [facility], you and your health needs are our priority.  As part of our continuing mission to provide you with exceptional heart care, our providers are all part of one team.  This team includes your primary Cardiologist (physician) and Advanced Practice Providers or APPs (Physician Assistants and Nurse Practitioners) who all work together to provide you with the care you need, when you need it.  Your next appointment:   1 year(s)  Provider:   Soyla Norton, MD or Daphne Barrack, NP

## 2024-06-24 ENCOUNTER — Ambulatory Visit

## 2024-11-04 ENCOUNTER — Encounter

## 2024-11-19 ENCOUNTER — Inpatient Hospital Stay: Admitting: Adult Health

## 2024-11-19 ENCOUNTER — Inpatient Hospital Stay

## 2025-01-28 ENCOUNTER — Ambulatory Visit: Admitting: Hematology and Oncology

## 2025-02-03 ENCOUNTER — Encounter

## 2025-04-09 ENCOUNTER — Other Ambulatory Visit (HOSPITAL_COMMUNITY)

## 2025-05-05 ENCOUNTER — Encounter

## 2025-08-04 ENCOUNTER — Encounter
